# Patient Record
Sex: Female | Born: 1950 | Race: Black or African American | Hispanic: No | Marital: Single | Smoking: Never smoker
Health system: Southern US, Community
[De-identification: ages and names within clinical notes are randomized; demographics above are authoritative.]

## PROBLEM LIST (undated history)

## (undated) DIAGNOSIS — G4730 Sleep apnea, unspecified: Secondary | ICD-10-CM

## (undated) DIAGNOSIS — J4489 Other specified chronic obstructive pulmonary disease: Secondary | ICD-10-CM

## (undated) DIAGNOSIS — I509 Heart failure, unspecified: Secondary | ICD-10-CM

## (undated) DIAGNOSIS — R079 Chest pain, unspecified: Secondary | ICD-10-CM

## (undated) DIAGNOSIS — M1189 Other specified crystal arthropathies, multiple sites: Secondary | ICD-10-CM

## (undated) DIAGNOSIS — I471 Supraventricular tachycardia, unspecified: Secondary | ICD-10-CM

## (undated) DIAGNOSIS — J45909 Unspecified asthma, uncomplicated: Secondary | ICD-10-CM

## (undated) DIAGNOSIS — R55 Syncope and collapse: Secondary | ICD-10-CM

## (undated) DIAGNOSIS — Z8739 Personal history of other diseases of the musculoskeletal system and connective tissue: Secondary | ICD-10-CM

## (undated) DIAGNOSIS — F3289 Other specified depressive episodes: Secondary | ICD-10-CM

## (undated) DIAGNOSIS — J449 Chronic obstructive pulmonary disease, unspecified: Secondary | ICD-10-CM

## (undated) DIAGNOSIS — M5136 Other intervertebral disc degeneration, lumbar region: Secondary | ICD-10-CM

## (undated) DIAGNOSIS — K649 Unspecified hemorrhoids: Secondary | ICD-10-CM

## (undated) DIAGNOSIS — G629 Polyneuropathy, unspecified: Secondary | ICD-10-CM

## (undated) DIAGNOSIS — I499 Cardiac arrhythmia, unspecified: Secondary | ICD-10-CM

## (undated) DIAGNOSIS — E669 Obesity, unspecified: Secondary | ICD-10-CM

## (undated) DIAGNOSIS — K449 Diaphragmatic hernia without obstruction or gangrene: Secondary | ICD-10-CM

## (undated) DIAGNOSIS — E039 Hypothyroidism, unspecified: Secondary | ICD-10-CM

## (undated) DIAGNOSIS — R002 Palpitations: Secondary | ICD-10-CM

## (undated) DIAGNOSIS — M8580 Other specified disorders of bone density and structure, unspecified site: Secondary | ICD-10-CM

## (undated) DIAGNOSIS — N189 Chronic kidney disease, unspecified: Secondary | ICD-10-CM

## (undated) DIAGNOSIS — M19041 Primary osteoarthritis, right hand: Secondary | ICD-10-CM

## (undated) DIAGNOSIS — E079 Disorder of thyroid, unspecified: Secondary | ICD-10-CM

## (undated) DIAGNOSIS — I10 Essential (primary) hypertension: Secondary | ICD-10-CM

## (undated) DIAGNOSIS — M5030 Other cervical disc degeneration, unspecified cervical region: Secondary | ICD-10-CM

## (undated) DIAGNOSIS — M797 Fibromyalgia: Secondary | ICD-10-CM

## (undated) DIAGNOSIS — R609 Edema, unspecified: Secondary | ICD-10-CM

## (undated) DIAGNOSIS — M19042 Primary osteoarthritis, left hand: Secondary | ICD-10-CM

## (undated) DIAGNOSIS — E7800 Pure hypercholesterolemia, unspecified: Secondary | ICD-10-CM

## (undated) DIAGNOSIS — R0989 Other specified symptoms and signs involving the circulatory and respiratory systems: Secondary | ICD-10-CM

## (undated) DIAGNOSIS — J329 Chronic sinusitis, unspecified: Secondary | ICD-10-CM

## (undated) DIAGNOSIS — H269 Unspecified cataract: Secondary | ICD-10-CM

## (undated) DIAGNOSIS — M810 Age-related osteoporosis without current pathological fracture: Secondary | ICD-10-CM

## (undated) DIAGNOSIS — K219 Gastro-esophageal reflux disease without esophagitis: Secondary | ICD-10-CM

## (undated) DIAGNOSIS — F329 Major depressive disorder, single episode, unspecified: Secondary | ICD-10-CM

## (undated) HISTORY — DX: Obesity, unspecified: E66.9

## (undated) HISTORY — DX: Other cervical disc degeneration, unspecified cervical region: M50.30

## (undated) HISTORY — DX: Other specified symptoms and signs involving the circulatory and respiratory systems: R09.89

## (undated) HISTORY — DX: Personal history of other diseases of the musculoskeletal system and connective tissue: Z87.39

## (undated) HISTORY — DX: Other intervertebral disc degeneration, lumbar region: M51.36

## (undated) HISTORY — DX: Other specified depressive episodes: F32.89

## (undated) HISTORY — DX: Major depressive disorder, single episode, unspecified: F32.9

## (undated) HISTORY — DX: Other specified disorders of bone density and structure, unspecified site: M85.80

## (undated) HISTORY — PX: CATARACT EXTRACTION, BILATERAL: SHX1313

## (undated) HISTORY — DX: Other specified chronic obstructive pulmonary disease: J44.89

## (undated) HISTORY — DX: Primary osteoarthritis, right hand: M19.041

## (undated) HISTORY — DX: Chest pain, unspecified: R07.9

## (undated) HISTORY — PX: CARPAL TUNNEL RELEASE: SHX101

## (undated) HISTORY — DX: Diaphragmatic hernia without obstruction or gangrene: K44.9

## (undated) HISTORY — DX: Heart failure, unspecified: I50.9

## (undated) HISTORY — DX: Chronic kidney disease, unspecified: N18.9

## (undated) HISTORY — DX: Unspecified hemorrhoids: K64.9

## (undated) HISTORY — DX: Unspecified cataract: H26.9

## (undated) HISTORY — DX: Palpitations: R00.2

## (undated) HISTORY — DX: Supraventricular tachycardia: I47.1

## (undated) HISTORY — DX: Cardiac arrhythmia, unspecified: I49.9

## (undated) HISTORY — DX: Supraventricular tachycardia, unspecified: I47.10

## (undated) HISTORY — DX: Syncope and collapse: R55

## (undated) HISTORY — DX: Polyneuropathy, unspecified: G62.9

## (undated) HISTORY — DX: Primary osteoarthritis, left hand: M19.042

## (undated) HISTORY — DX: Age-related osteoporosis without current pathological fracture: M81.0

## (undated) HISTORY — DX: Essential (primary) hypertension: I10

## (undated) HISTORY — DX: Chronic obstructive pulmonary disease, unspecified: J44.9

## (undated) HISTORY — PX: INCONTINENCE SURGERY: SHX676

---

## 1997-02-01 HISTORY — PX: OTHER SURGICAL HISTORY: SHX169

## 2005-04-05 ENCOUNTER — Ambulatory Visit: Payer: Self-pay | Admitting: Cardiology

## 2005-04-06 ENCOUNTER — Ambulatory Visit: Payer: Self-pay | Admitting: Cardiology

## 2005-12-03 ENCOUNTER — Ambulatory Visit: Payer: Self-pay | Admitting: Cardiology

## 2005-12-28 ENCOUNTER — Ambulatory Visit: Payer: Self-pay | Admitting: Cardiology

## 2006-07-31 ENCOUNTER — Ambulatory Visit: Payer: Self-pay | Admitting: Cardiology

## 2006-08-05 ENCOUNTER — Ambulatory Visit: Payer: Self-pay | Admitting: Cardiology

## 2007-04-02 ENCOUNTER — Ambulatory Visit: Payer: Self-pay | Admitting: Cardiology

## 2008-04-12 ENCOUNTER — Ambulatory Visit: Payer: Self-pay | Admitting: Cardiology

## 2008-04-12 ENCOUNTER — Encounter: Payer: Self-pay | Admitting: Physician Assistant

## 2008-07-23 ENCOUNTER — Ambulatory Visit: Payer: Self-pay | Admitting: Cardiology

## 2008-09-29 ENCOUNTER — Encounter: Payer: Self-pay | Admitting: Cardiology

## 2009-04-08 ENCOUNTER — Encounter: Payer: Self-pay | Admitting: Cardiology

## 2009-06-02 ENCOUNTER — Ambulatory Visit: Payer: Self-pay | Admitting: Cardiology

## 2009-06-02 DIAGNOSIS — R002 Palpitations: Secondary | ICD-10-CM | POA: Insufficient documentation

## 2009-06-02 DIAGNOSIS — J449 Chronic obstructive pulmonary disease, unspecified: Secondary | ICD-10-CM

## 2009-06-02 DIAGNOSIS — F329 Major depressive disorder, single episode, unspecified: Secondary | ICD-10-CM

## 2009-06-02 DIAGNOSIS — I498 Other specified cardiac arrhythmias: Secondary | ICD-10-CM

## 2009-06-02 DIAGNOSIS — I509 Heart failure, unspecified: Secondary | ICD-10-CM | POA: Insufficient documentation

## 2009-06-02 DIAGNOSIS — E669 Obesity, unspecified: Secondary | ICD-10-CM

## 2009-06-02 DIAGNOSIS — I499 Cardiac arrhythmia, unspecified: Secondary | ICD-10-CM | POA: Insufficient documentation

## 2009-06-02 DIAGNOSIS — J441 Chronic obstructive pulmonary disease with (acute) exacerbation: Secondary | ICD-10-CM | POA: Insufficient documentation

## 2009-06-02 DIAGNOSIS — K449 Diaphragmatic hernia without obstruction or gangrene: Secondary | ICD-10-CM | POA: Insufficient documentation

## 2009-06-02 DIAGNOSIS — I5032 Chronic diastolic (congestive) heart failure: Secondary | ICD-10-CM | POA: Insufficient documentation

## 2009-06-02 DIAGNOSIS — J4489 Other specified chronic obstructive pulmonary disease: Secondary | ICD-10-CM | POA: Insufficient documentation

## 2009-06-02 DIAGNOSIS — R079 Chest pain, unspecified: Secondary | ICD-10-CM | POA: Insufficient documentation

## 2009-06-09 ENCOUNTER — Encounter: Payer: Self-pay | Admitting: Cardiology

## 2009-06-13 ENCOUNTER — Encounter (INDEPENDENT_AMBULATORY_CARE_PROVIDER_SITE_OTHER): Payer: Self-pay | Admitting: *Deleted

## 2009-12-30 ENCOUNTER — Ambulatory Visit: Payer: Self-pay | Admitting: Cardiology

## 2009-12-30 DIAGNOSIS — R0602 Shortness of breath: Secondary | ICD-10-CM | POA: Insufficient documentation

## 2009-12-30 DIAGNOSIS — M0510 Rheumatoid lung disease with rheumatoid arthritis of unspecified site: Secondary | ICD-10-CM | POA: Insufficient documentation

## 2010-01-04 ENCOUNTER — Encounter: Payer: Self-pay | Admitting: Cardiology

## 2010-01-11 ENCOUNTER — Encounter (INDEPENDENT_AMBULATORY_CARE_PROVIDER_SITE_OTHER): Payer: Self-pay | Admitting: *Deleted

## 2010-04-08 ENCOUNTER — Encounter: Payer: Self-pay | Admitting: Physician Assistant

## 2010-04-09 ENCOUNTER — Encounter: Payer: Self-pay | Admitting: Physician Assistant

## 2010-04-10 ENCOUNTER — Encounter: Payer: Self-pay | Admitting: Cardiology

## 2010-04-26 ENCOUNTER — Ambulatory Visit: Payer: Self-pay | Admitting: Physician Assistant

## 2010-05-04 ENCOUNTER — Ambulatory Visit: Payer: Self-pay | Admitting: Physician Assistant

## 2010-07-22 ENCOUNTER — Encounter: Payer: Self-pay | Admitting: Cardiology

## 2010-09-05 ENCOUNTER — Encounter: Payer: Self-pay | Admitting: Cardiology

## 2010-09-28 ENCOUNTER — Ambulatory Visit
Admission: RE | Admit: 2010-09-28 | Discharge: 2010-09-28 | Payer: Self-pay | Source: Home / Self Care | Attending: Cardiology | Admitting: Cardiology

## 2010-10-05 NOTE — Assessment & Plan Note (Signed)
Summary: 1 WK F/U-EKG-PER 8/24 OV W/GENE-JM   Visit Type:  Follow-up Primary Provider:  Dr Linna Darner   History of Present Illness: patient presents for scheduled early followup.  When last seen, she was found to be in isorhythmic dissociation. I recommended she stopped both Lopressor and verapamil, to return today for early follow up with repeat EKG. Electrocardiogram indicates normal sinus rhythm at 62 bpm.  Clinically, she states that she began feeling "nervous" on Monday, and resumed metoprolol, albeit at once daily dosing. She did not resume verapamil, as instructed. Overall, she seems to feel a little "better".  Preventive Screening-Counseling & Management  Alcohol-Tobacco     Smoking Status: never  Current Medications (verified): 1)  Furosemide 40 Mg Tabs (Furosemide) .... Take 1 Tablet By Mouth Once A Day 2)  Paxil 30 Mg Tabs (Paroxetine Hcl) .... Take 1 Tablet By Mouth Once A Day 3)  Potassium Chloride Crys Cr 10 Meq Cr-Tabs (Potassium Chloride Crys Cr) .... Take 1 Tablet By Mouth Once A Day 4)  Proair Hfa 108 (90 Base) Mcg/act Aers (Albuterol Sulfate) .... Inhale 2 Puffs [place] Daily 5)  Spiriva Handihaler 18 Mcg Caps (Tiotropium Bromide Monohydrate) .... One Inhalation Daily 6)  Restasis 0.05 % Emul (Cyclosporine) .... Two Times A Day 7)  Flonase 50 Mcg/act Susp (Fluticasone Propionate) .... 2 Sprays in Each Nostril Once Daily 8)  Polyethylene Glycol  Powd (Polyethylene Glycol 1450) .Marland KitchenMarland KitchenMarland Kitchen 17 Grams in 8 Oz of Water Once Daily 9)  Metoprolol Tartrate 50 Mg Tabs (Metoprolol Tartrate) .... Take 1/2 Tablet By Mouth As Needed 10)  Fexofenadine Hcl 60 Mg Tabs (Fexofenadine Hcl) .... Take 1 Tablet By Mouth Two Times A Day 11)  Clonazepam 0.5 Mg Tabs (Clonazepam) .... Take 1 Tab By Mouth At Bedtime 12)  Symbicort 160-4.5 Mcg/act Aero (Budesonide-Formoterol Fumarate) .... 2 Puffs Two Times A Day 13)  Aspirin 81 Mg Tbec (Aspirin) .... Take One Tablet By Mouth Daily 14)  Pepcid Ac 10 Mg  Tabs (Famotidine) .... Take 1 Tablet By Mouth Once A Day As Needed  Allergies (verified): 1)  ! Codeine 2)  ! Adhesive Tape  Comments:  Nurse/Medical Assistant: The patient's medication  bottles and allergies were reviewed with the patient and were updated in the Medication and Allergy Lists.  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 All other systems negative.   Vital Signs:  Patient profile:   60 year old female Height:      61 inches Weight:      210 pounds O2 Sat:      99 % on Room air Pulse rate:   66 / minute BP sitting:   134 / 79  (left arm) Cuff size:   large  Vitals Entered By: Carlye Grippe (May 04, 2010 3:26 PM)  O2 Flow:  Room air  Physical Exam  Additional Exam:  GEN: 60 year old female, morbidly obese, no distress HEENT: NCAT,PERRLA,EOMI NECK: palpable pulses, no bruits; no JVD; no TM LUNGS: CTA bilaterally HEART: RRR (S1S2); no significant murmurs; no rubs; no gallops  ABD: soft, NT; intact BS EXT: intact distal pulses; no edema SKIN: warm, dry MUSC: no obvious deformity NEURO: A/O (x3)     EKG  Procedure date:  05/04/2010  Findings:      normal sinus rhythm at 62 bpm; was no ischemic changes  Impression & Recommendations:  Problem # 1:  SUPRAVENTRICULAR TACHYCARDIA  (ICD-427.89)  repeat electrocardiogram in the office today indicates resolution of recently documented isorhythmic dissociation, following temporary cessation of combination of metoprolol and verapamil. Of note, however, the patient did resume metoprolol on her own, earlier this week, at a reduced dose of once daily dosing. Given that she is tolerating this quite well, I have elected to have her remain on this medication, rather than place her on verapamil only, for her history of SVT. She is agreeable with this plan, and will return to clinic to resume followup with Dr. Andee Lineman in 6 months.  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 You will receive a reminder letter in the mail one-two months in advance. If you don't receive a letter, please call our office to schedule the follow-up appointment. 2)  Finish current supply of Lopressor (metoprolol tart). 3)  Then start Tropol XL (metoprolol succ or ER) 50mg  by mouth once daily. Prescriptions: METOPROLOL SUCCINATE 50 MG XR24H-TAB (METOPROLOL SUCCINATE) Take one tablet by mouth daily  #30 x 6   Entered by:   Cyril Loosen, RN, BSN   Authorized by:   Nelida Meuse, PA-C   Signed by:   Cyril Loosen, RN, BSN on 05/04/2010   Method used:   Electronically to        [facility]* (retail)       [address]       [place], [place]  [address]       Ph: [phone]       Fax: [phone]   RxID:   [id number]  I have reviewed and approved all prescriptions at the time of the office visit. Nelida Meuse, PA-C  May 04, 2010 3:58 PM

## 2010-10-05 NOTE — Assessment & Plan Note (Signed)
Summary: 4 MO FUL   Visit Type:  Follow-up Primary Provider:  Hasanaj   History of Present Illness: patient presents for scheduled followup.  She has history of SVT and diastolic heart failure, and no known CAD. During previous visits, she was found to have evidence of isorhythmic dissociation by EKG, which resolved following discontinuation of verapamil, and temporary cessation of metoprolol. The latter was subsequently resumed by her, and followup EKGs indicated restoration of NSR.  Clinically, she denies any interim CP or tachypalpitations. She is being closely followed by Dr. Cherie Ouch, with recent diagnosis of pulmonary hypertension. She had a recent cardiopulmonary function test, and is due to see him next month in followup. She denied any associated CP, during stress testing.  Preventive Screening-Counseling & Management  Alcohol-Tobacco     Smoking Status: never  Current Medications (verified): 1)  Furosemide 40 Mg Tabs (Furosemide) .... Take 1 Tablet By Mouth Once A Day 2)  Paxil 30 Mg Tabs (Paroxetine Hcl) .... Take 1 Tablet By Mouth Once A Day 3)  Potassium Chloride Crys Cr 10 Meq Cr-Tabs (Potassium Chloride Crys Cr) .... Take 1 Tablet By Mouth Once A Day 4)  Proair Hfa 108 (90 Base) Mcg/act Aers (Albuterol Sulfate) .... Inhale 2 Puffs [place] Daily 5)  Restasis 0.05 % Emul (Cyclosporine) .... Two Times A Day 6)  Flonase 50 Mcg/act Susp (Fluticasone Propionate) .... 2 Sprays in Each Nostril Once Daily 7)  Polyethylene Glycol  Powd (Polyethylene Glycol 1450) .Marland KitchenMarland KitchenMarland Kitchen 17 Grams in 8 Oz of Water Once Daily 8)  Loratadine 10 Mg Tabs (Loratadine) .... Take 1 Tablet By Mouth Once A Day 9)  Aspirin 81 Mg Tbec (Aspirin) .... Take One Tablet By Mouth Daily 10)  Ranitidine Hcl 150 Mg Caps (Ranitidine Hcl) .... Take 1 Tablet By Mouth Once A Day 11)  Metoprolol Succinate 50 Mg Xr24h-Tab (Metoprolol Succinate) .... Take One Tablet By Mouth Daily 12)  Meloxicam 15 Mg Tabs  (Meloxicam) .... Take 1 Tablet By Mouth Once A Day 13)  Miralax  Powd (Polyethylene Glycol 3350) .... Dissolve 17gm in [employer] or Juice and Drink Once Daily  Allergies (verified): 1)  ! Codeine 2)  ! Adhesive Tape  Comments:  Nurse/Medical Assistant: The patient's medication bottles and allergies were reviewed with the patient and were updated in the Medication and Allergy Lists.  Past History:  Past Medical History: SUPRAVENTRICULAR TACHYCARDIA (ICD-427.89) CHEST PAIN UNSPECIFIED (ICD-786.50) CHF (ICD-428.0) DEPRESSIVE DISORDER NOT ELSEWHERE CLASSIFIED (ICD-311) DIAPHRAGMAT HERN W/O MENTION OBSTRUCTION/GANGREN (ICD-553.3) UNSPECIFIED ESSENTIAL HYPERTENSION (ICD-401.9) UNSPECIFIED CARDIAC DYSRHYTHMIA (ICD-427.9) PALPITATIONS (ICD-785.1) COPD (ICD-496) OBESITY, UNSPECIFIED (ICD-278.00) History of Rheumatoid arthritis  Bilateral Cartoid Bruits  Review of Systems       No fevers, chills, hemoptysis, dysphagia, melena, hematocheezia, hematuria, rash, claudication, orthopnea, pnd, pedal edema. All other systems negative.   Vital Signs:  Patient profile:   60 year old female Height:      61 inches Weight:      209 pounds Pulse rate:   62 / minute BP sitting:   122 / 81  (left arm) Cuff size:   large  Vitals Entered By: Carlye Grippe (September 28, 2010 3:08 PM)  Physical Exam  Additional Exam:  GEN: 60 year old female, morbidly obese, no distress HEENT: NCAT,PERRLA,EOMI NECK: palpable pulses, no bruits; no JVD; no TM LUNGS: CTA bilaterally HEART: RRR (S1S2); no significant murmurs; no rubs; no gallops ABD: soft, NT; intact BS EXT: intact distal pulses; no edema SKIN: warm, dry MUSC: no obvious deformity  NEURO: A/O (x3)     Impression & Recommendations:  Problem # 1:  SUPRAVENTRICULAR TACHYCARDIA (ICD-427.89)  No suggestion of recurrent SVT, per patient history. Therefore, continue current medication regimen, and schedule return follow up in one year, with Dr.  Andee Lineman.  Problem # 2:  CHRONIC DIASTOLIC HEART FAILURE (ICD-428.32)  the patient appears euvolemic by history and physical presentation. She has lost one pound, since her last visit. She is on maintenance dose furosemide, followed by Dr. Olena Leatherwood.  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 She has no known history of CAD, and had a normal Cardiolite in 2007.  Patient Instructions: 1)  Your physician recommends that you continue on your current medications as directed. Please refer to the Current Medication list given to you today. 2)  Follow up in  1 year

## 2010-10-05 NOTE — Miscellaneous (Signed)
Summary: Rehab Report/ VISIT  Rehab Report/ VISIT   Imported By: Dorise Hiss 09/28/2010 14:21:49  _____________________________________________________________________  External Attachment:    Type:   Image     Comment:   External Document

## 2010-10-05 NOTE — Letter (Signed)
Summary: Engineer, materials at [facility]  [address]   [place], [place] [address]   Phone: [phone]  Fax: [phone]        Jan 11, 2010 MRN: [id number]   DAWSYN ZURN [address] [place], [place]  [address]   Dear Ms. Goble,  Your test ordered by Selena Batten has been reviewed by your physician (or physician assistant) and was found to be normal or stable. Your physician (or physician assistant) felt no changes were needed at this time.  ____ Echocardiogram  ____ Cardiac Stress Test  __X__ Lab Work  ____ Peripheral vascular study of arms, legs or neck  ____ CT scan or X-ray  ____ Lung or Breathing test  ____ Other:   Thank you.   Hoover Brunette, LPN    Duane Boston, M.D., F.A.C.C. Thressa Sheller, M.D., F.A.C.C. Oneal Grout, M.D., F.A.C.C. Cheree Ditto, M.D., F.A.C.C. Daiva Nakayama, M.D., F.A.C.C. Kenney Houseman, M.D., F.A.C.C. Jeanne Ivan, PA-C

## 2010-10-05 NOTE — Assessment & Plan Note (Signed)
Summary: EPH-2 WK POST HOSP. FU D/C [facility] 8-8 VS   Visit Type:  hospital follow-up Primary Provider:  Dr Linna Darner   History of Present Illness: patient presents for post hospital followup, following recent hospitalization here at [facility] with chest pain/dyspnea. Formal cardiology consultation was not requested. Serialcardiac markers normal, BNP negative, and no evidence of CHF on x-ray.  Since then, patient denies any recurrent chest pain. She denies symptoms suggestive of CHF. She is due to follow up with Dr. Orson Aloe, whom she has seen in the past, for intrinsic lung disease (rheumatoid).  Patient has history of SVT, and has occasional palpitations, essentially asymptomatic. Of note, she also complains of intermittent dizziness, unpredictable in onset, and not associated with palpitations. She denies frank syncope.  Preventive Screening-Counseling & Management  Alcohol-Tobacco     Smoking Status: never  Current Medications (verified): 1)  Furosemide 40 Mg Tabs (Furosemide) .... Take 1 Tablet By Mouth Once A Day 2)  Paxil 30 Mg Tabs (Paroxetine Hcl) .... Take 1 Tablet By Mouth Once A Day 3)  Potassium Chloride Crys Cr 10 Meq Cr-Tabs (Potassium Chloride Crys Cr) .... Take 1 Tablet By Mouth Once A Day 4)  Verapamil Hcl Cr 180 Mg Cr-Tabs (Verapamil Hcl) .... Take 1 Tablet By Mouth Once A Day 5)  Proair Hfa 108 (90 Base) Mcg/act Aers (Albuterol Sulfate) .... Inhale 2 Puffs [place] Daily 6)  Spiriva Handihaler 18 Mcg Caps (Tiotropium Bromide Monohydrate) .... One Inhalation Daily 7)  Restasis 0.05 % Emul (Cyclosporine) .... Two Times A Day 8)  Flonase 50 Mcg/act Susp (Fluticasone Propionate) .... 2 Sprays in Each Nostril Once Daily 9)  Polyethylene Glycol  Powd (Polyethylene Glycol 1450) .Marland KitchenMarland KitchenMarland Kitchen 17 Grams in 8 Oz of Water Once Daily 10)  Metoprolol Tartrate 50 Mg Tabs (Metoprolol Tartrate) .... Take One Tablet By Mouth Twice A Day 11)  Fexofenadine Hcl 60 Mg Tabs (Fexofenadine Hcl) .... Take 1  Tablet By Mouth Two Times A Day 12)  Clonazepam 0.5 Mg Tabs (Clonazepam) .... Take 1 Tab By Mouth At Bedtime 13)  Symbicort 160-4.5 Mcg/act Aero (Budesonide-Formoterol Fumarate) .... 2 Puffs Two Times A Day  Allergies (verified): 1)  ! Codeine 2)  ! Adhesive Tape  Comments:  Nurse/Medical Assistant: The patient's medication list and allergies were reviewed with the patient and were updated in the Medication and Allergy Lists.  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 All other systems negative.   Vital Signs:  Patient profile:   60 year old female Height:      61 inches Weight:      210 pounds BMI:     39.82 O2 Sat:      98 % on Room air Pulse rate:   43 / minute BP sitting:   114 / 67  (left arm) Cuff size:   large  Vitals Entered By: Carlye Grippe (April 26, 2010 2:01 PM)  Nutrition Counseling: Patient's BMI is greater than 25 and therefore counseled on weight management options.  O2 Flow:  Room air  Physical Exam  Additional Exam:  GEN: 60 year old female, morbidly obese, no distress HEENT: NCAT,PERRLA,EOMI NECK: palpable pulses, no bruits; no JVD; no TM LUNGS: CTA bilaterally HEART:irregularly irregular (S1S2); no significant murmurs; no rubs; no gallops ABD: soft, NT; intact BS EXT: intact  distal pulses; no edema SKIN: warm, dry MUSC: no obvious deformity NEURO: A/O (x3)     EKG  Procedure date:  04/26/2010  Findings:      iso-arrhythmic dissociation at 52 bpm; no ischemic changes.  Impression & Recommendations:  Problem # 1:  CHEST PAIN UNSPECIFIED (ICD-786.50)  atypical, per recent history, with normal cardiac markers. No documented history of CAD, with normal Cardiolite in 2007. No further workup indicated.  Problem # 2:  CHRONIC DIASTOLIC HEART FAILURE (ICD-428.32)  euvolemic by history, with recent negative chest x-ray and BNP level. Diuretic treatment, per Dr. Linna Darner.  Problem # 3:  SUPRAVENTRICULAR TACHYCARDIA (ICD-427.89)  patient complains of long-standing palpitations, but no significant associated symptoms. However, she also presents with complaint of intermittent dizziness, unpredictable in onset, with no associated syncope. twelve-lead EKG today indicates isorhythmic dissociation. Will discontinue verapamil and metoprolol, and monitor closely with early clinic follow up with myself and Dr. Andee Lineman. Of note, patient is concerned regarding recurrent tachypalpitations; therefore, recommend p.r.n. metoprolol 25 mg, if needed ( Per Dr. Andee Lineman, preference would be to discontinue beta blocker, given its pronounced effect on the SA node, and continue treatment with calcium channel blocker, for history of SVT).  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 Cherie Ouch.  Other Orders: EKG w/ Interpretation (93000)  Patient Instructions: 1)  Follow up with Gene on Thursday, Sept 1, 2011 2:20pm. 2)  Stop Verapamil.  3)  Stop Metoprolol tart. Take only 1/2 tablet as needed for prolonged palpitations/racing heart. (Do not take more than 1 tablet per day.) 4)  Decrease Aspirin to 81mg  by mouth once daily.

## 2010-10-05 NOTE — Letter (Signed)
Summary: [facility] H&P/ D/C DR. [facility]  [facility] H[facility]P/ D/C DR. [facility]WAR   Imported By: Zachary George 04/25/2010 13:01:36  _____________________________________________________________________  External Attachment:    Type:   Image     Comment:   External Document

## 2010-10-05 NOTE — Assessment & Plan Note (Signed)
Summary: 6 MO FU PER APRIL   Visit Type:  Follow-up Primary Provider:  Dr Linna Darner   History of Present Illness: the patient is a 60 year old female with history of supraventricular tachycardia and diastolic heart failure.  The patient also has obstructive sleep apnea but is not compliant with her CPAP device.  She has normal LV function.  She has no history of coronary artery disease.  She had a normal Cardiolite in 2007.  Chills abnormal carotid Doppler several years ago.  She has been diagnosed with rheumatoid lung disease.  The patient has chronic dyspnea.  She is in NYHA class two.  She participates in pulmonary rehab.  She has developed some lower extremity edema possibly related to her verapamil.  She denies any chest pain palpitations or syncope.  Preventive Screening-Counseling & Management  Alcohol-Tobacco     Smoking Status: never  Current Medications (verified): 1)  Furosemide 40 Mg Tabs (Furosemide) .... Take 1 Tablet By Mouth Once A Day 2)  Paxil 30 Mg Tabs (Paroxetine Hcl) .... Take 1 Tablet By Mouth Once A Day 3)  Potassium Chloride Crys Cr 10 Meq Cr-Tabs (Potassium Chloride Crys Cr) .... Take 1 Tablet By Mouth Once A Day 4)  Verapamil Hcl Cr 180 Mg Cr-Tabs (Verapamil Hcl) .... Take 1 Tablet By Mouth Once A Day 5)  Proair Hfa 108 (90 Base) Mcg/act Aers (Albuterol Sulfate) .... Inhale 2 Puffs [place] Daily 6)  Spiriva Handihaler 18 Mcg Caps (Tiotropium Bromide Monohydrate) .... One Inhalation Daily 7)  Restasis 0.05 % Emul (Cyclosporine) .... Two Times A Day 8)  Flonase 50 Mcg/act Susp (Fluticasone Propionate) .... 2 Sprays in Each Nostril Once Daily 9)  Polyethylene Glycol  Powd (Polyethylene Glycol 1450) .Marland KitchenMarland KitchenMarland Kitchen 17 Grams in 8 Oz of Water Once Daily 10)  Metoprolol Tartrate 50 Mg Tabs (Metoprolol Tartrate) .... Take One Tablet By Mouth Twice A Day 11)  Fexofenadine Hcl 60 Mg Tabs (Fexofenadine Hcl) .... Take 1 Tablet By Mouth Two Times A Day 12)  Clonazepam 0.5 Mg Tabs  (Clonazepam) .... Take 1 Tab By Mouth At Bedtime  Allergies: 1)  ! Codeine 2)  ! Adhesive Tape  Comments:  Nurse/Medical Assistant: The patient's medications were reviewed with the patient and were updated in the Medication List. Pt brought a list of medications to office visit.  Cyril Loosen, RN, BSN (December 30, 2009 8:50 AM)   Past History:  Past Medical History: Last updated: 06/02/2009 SUPRAVENTRICULAR TACHYCARDIA (ICD-427.89) CHEST PAIN UNSPECIFIED (ICD-786.50) CHF (ICD-428.0) DEPRESSIVE DISORDER NOT ELSEWHERE CLASSIFIED (ICD-311) DIAPHRAGMAT HERN W/O MENTION OBSTRUCTION/GANGREN (ICD-553.3) UNSPECIFIED ESSENTIAL HYPERTENSION (ICD-401.9) UNSPECIFIED CARDIAC DYSRHYTHMIA (ICD-427.9) PALPITATIONS (ICD-785.1) COPD (ICD-496) OBESITY, UNSPECIFIED (ICD-278.00) DIETARY SURVEILLANCE AND COUNSELING (ICD-V65.3) DM (ICD-250.00) History of Rheumatoid arthritis  Bilateral Cartoid Bruits  Past Surgical History: Last updated: 06/02/2009 vein surgery bladder carpel tunnel catheter ablation 02/1997 @ [facility]  Family History: Last updated: 06/02/2009 Mother: Had heart disease and cancer Strokes run in the family  Social History: Last updated: 06/02/2009 Tobacco Use - No.  Alcohol Use - no Drug Use - no Regular Exercise - yes Retired  Single   Risk Factors: Smoking Status: never (12/30/2009)  Review of Systems       The patient complains of shortness of breath, sleep apnea, and leg swelling.  The patient denies fatigue, malaise, fever, weight gain/loss, vision loss, decreased hearing, hoarseness, chest pain, palpitations, prolonged cough, wheezing, coughing up blood, abdominal pain, blood in stool, nausea, vomiting, diarrhea, heartburn, incontinence, blood in urine, muscle weakness, joint  pain, rash, skin lesions, headache, fainting, dizziness, depression, anxiety, enlarged lymph nodes, easy bruising or bleeding, and environmental allergies.    Vital Signs:  Patient  profile:   60 year old female Height:      61 inches Weight:      212.50 pounds O2 Sat:      97 % on Room air Pulse rate:   51 / minute BP sitting:   103 / 66  (left arm) Cuff size:   large  Vitals Entered By: Cyril Loosen, RN, BSN (December 30, 2009 8:44 AM)  O2 Flow:  Room air Comments Follow up visit.   Physical Exam  Additional Exam:  General: Well-developed, well-nourished in no distress head: Normocephalic and atraumatic eyes PERRLA/EOMI intact, conjunctiva and lids normal nose: No deformity or lesions mouth normal dentition, normal posterior pharynx neck: Supple, no JVD.  No masses, thyromegaly or abnormal cervical nodes lungs: Normal breath sounds bilaterally without wheezing.  Normal percussion heart: regular rate and rhythm with normal S1 and S2, no S3 or S4.  PMI is normal.  No pathological murmurs abdomen: Normal bowel sounds, abdomen is soft and nontender without masses, organomegaly or hernias noted.  No hepatosplenomegaly musculoskeletal: Back normal, normal gait muscle strength and tone normal pulsus: Pulse is normal in all 4 extremities Extremities: 1+ peripheral pitting edema neurologic: Alert and oriented x 3 skin: Intact without lesions or rashes cervical nodes: No significant adenopathy psychologic: Normal affect    Impression & Recommendations:  Problem # 1:  SHORTNESS OF BREATH (ICD-786.05) multifactorial related to deconditioning, diastolic heart failure and in particular rheumatoid lung disease Her updated medication list for this problem includes:    Furosemide 40 Mg Tabs (Furosemide) .Marland Kitchen... Take 1 tablet by mouth once a day    Verapamil Hcl Cr 180 Mg Cr-tabs (Verapamil hcl) .Marland Kitchen... Take 1 tablet by mouth once a day    Metoprolol Tartrate 50 Mg Tabs (Metoprolol tartrate) .Marland Kitchen... Take one tablet by mouth twice a day  Orders: T-Basic Metabolic Panel [phone])  Problem # 2:  CHRONIC DIASTOLIC HEART FAILURE (ICD-428.32) the patient does have some  edema but is likely secondary to verapamil.  I doubt a significant volume overload.  I would continue her current medical management and recommend support stockings.  We will obtain an electrolyte panel in one week. Her updated medication list for this problem includes:    Furosemide 40 Mg Tabs (Furosemide) .Marland Kitchen... Take 1 tablet by mouth once a day    Verapamil Hcl Cr 180 Mg Cr-tabs (Verapamil hcl) .Marland Kitchen... Take 1 tablet by mouth once a day    Metoprolol Tartrate 50 Mg Tabs (Metoprolol tartrate) .Marland Kitchen... Take one tablet by mouth twice a day  Orders: T-Basic Metabolic Panel [phone])  Problem # 3:  SUPRAVENTRICULAR TACHYCARDIA (ICD-427.89) no recurrence. Her updated medication list for this problem includes:    Verapamil Hcl Cr 180 Mg Cr-tabs (Verapamil hcl) .Marland Kitchen... Take 1 tablet by mouth once a day    Metoprolol Tartrate 50 Mg Tabs (Metoprolol tartrate) .Marland Kitchen... Take one tablet by mouth twice a day  Problem # 4:  RHEUMATOID LUNG (ICD-714.81) Assessment: Comment Only  Patient Instructions: 1)  Labs:  BMET next week 2)  Follow up in  6 months

## 2010-12-19 ENCOUNTER — Other Ambulatory Visit: Payer: Self-pay | Admitting: Neurology

## 2010-12-19 DIAGNOSIS — G959 Disease of spinal cord, unspecified: Secondary | ICD-10-CM

## 2010-12-19 DIAGNOSIS — M5412 Radiculopathy, cervical region: Secondary | ICD-10-CM

## 2010-12-28 ENCOUNTER — Ambulatory Visit (HOSPITAL_COMMUNITY)
Admission: RE | Admit: 2010-12-28 | Discharge: 2010-12-28 | Disposition: A | Discharge status: Home / Self Care | Payer: Medicare Other | Source: Ambulatory Visit | Attending: Neurology | Admitting: Neurology

## 2010-12-28 DIAGNOSIS — M6281 Muscle weakness (generalized): Secondary | ICD-10-CM | POA: Insufficient documentation

## 2010-12-28 DIAGNOSIS — M5020 Other cervical disc displacement, unspecified cervical region: Secondary | ICD-10-CM | POA: Insufficient documentation

## 2010-12-28 DIAGNOSIS — M5030 Other cervical disc degeneration, unspecified cervical region: Secondary | ICD-10-CM | POA: Insufficient documentation

## 2010-12-28 DIAGNOSIS — R209 Unspecified disturbances of skin sensation: Secondary | ICD-10-CM | POA: Insufficient documentation

## 2010-12-28 DIAGNOSIS — M5124 Other intervertebral disc displacement, thoracic region: Secondary | ICD-10-CM | POA: Insufficient documentation

## 2010-12-28 DIAGNOSIS — G959 Disease of spinal cord, unspecified: Secondary | ICD-10-CM

## 2010-12-28 DIAGNOSIS — M5412 Radiculopathy, cervical region: Secondary | ICD-10-CM

## 2011-01-10 ENCOUNTER — Other Ambulatory Visit: Payer: Self-pay | Admitting: Physician Assistant

## 2011-01-16 NOTE — Assessment & Plan Note (Signed)
[facility]                          [facility]   Dorothy Ford, Dorothy Ford                    MRN:          [id number]  DATE:04/02/2007                            DOB:          [birth year]    HISTORY OF PRESENT ILLNESS:  The patient is a 60 year old female with a  history of dyspnea and obstructive sleep apnea.  The patient has been  doing well.  She reports no chest pain, orthopnea, PND.  She was last  seen in November of 2007.  Given her cardiac risk factor profile and  dyspnea, a Cardiolite study was performed that demonstrated fair  exercise tolerance but no ischemia.  The patient is here for followup,  she has no new complaints.   MEDICATIONS:  1. Verapamil 180 mg p.o. daily.  2. Klor-Con 10 mg p.o. daily.  3. Paxil 30 mg p.o. daily.  4. Zyrtec 10 mg p.o. daily.  5. Lasix 20 mg p.o. daily.  6. Metoprolol 50 mg p.o. q.12.  7. Nasal spray.  8. Laxative.  9. Aspirin.  10.Multivitamin.  11.Calcium.  12.Glucosamine.   PHYSICAL EXAMINATION:  VITAL SIGNS:  Blood pressure 135/80, heart rate  is 63 bpm, weight is 228 pounds.  NECK EXAM:  Normal carotid upstroke and no carotid bruits.  LUNGS:  Clear breath sounds bilaterally.  HEART:  Regular rate and rhythm, normal S1, S2.  No murmurs, rubs or  gallop.  ABDOMEN:  Soft.  EXTREMITY EXAM:  No clubbing, cyanosis or edema.  NEURO:  Patient alert and oriented, grossly nonfocal.   A 12-lead echocardiogram normal sinus rhythm, no acute ischemic changes.   PROBLEM LIST:  1. Obstructive sleep apnea.  2. History of premature supraventricular tachycardia.  3. History of hypertension.  4. History of presumed diastolic dysfunction.  5. Ruled out for significant coronary artery disease with negative      Cardiolite study.  6. Bilateral carotid bruits but with normal Dopplers in 2003.   PLAN:  1. From a cardiovascular perspective, the patient appears to be      stable.  I do not think  there is an indication for catheterization.  2. The patient needs to start on an exercise program.  We have      discussed risk factor modification.  3. The patient can followup with [place] in 1 year.     Learta Codding, MD,FACC  Electronically Signed    GED/MedQ  DD: 04/02/2007  DT: 04/03/2007  Job #: [id number]

## 2011-01-16 NOTE — Assessment & Plan Note (Signed)
[facility]                          [facility] NOTE   Dorothy Ford, Dorothy Ford                    MRN:          [id number]  DATE:04/12/2008                            DOB:          02-12-1951    PRIMARY CARE PHYSICIAN:  Erasmo Downer, MD   PRIMARY CARDIOLOGIST:  Learta Codding, MD, [facility]   REASON FOR VISIT:  Routine followup.   HISTORY OF PRESENT ILLNESS:  Dorothy Ford returns for a 1-year visit.  She has a history of paroxysmal supraventricular tachycardia, reportedly  status post failed attempt of ablation at [facility] many years ago.  She has no clearly documented history of  obstructive coronary artery disease based on Cardiolite from 2007 and  overall has normal left ventricular systolic function without any  significant valvular abnormalities based on echocardiography, also from  2007.  She reports no major problems with recurrent or persistent  palpitations on her present medications.  She mainly states that she has  had some sinus difficulty over the last year.  She has not been  exercising regularly.  We did speak about this today.  She is not  reporting any anginal chest pain and has NYHA class II dyspnea on  exertion.  She has had no syncope.   ALLERGIES:  CODEINE.   PRESENT MEDICATIONS:  1. Verapamil 180 mg p.o. daily.  2. Klor-Con 10 mEq p.o. daily.  3. Paxil 40 mg p.o. daily.  4. Lasix 20 mg p.o. daily.  5. Metoprolol 50 mg p.o. b.i.d.  6. Restasis eyedrops q.12 h.  7. Nasacort as directed.  8. Aspirin 325 mg p.o. daily.  9. Multivitamin one p.o. daily.  10.Calcium 600 mg p.o. daily.  11.Glucosamine.  12.Allegra.   REVIEW OF SYSTEMS:  As per history of present illness.  Otherwise  negative.   PHYSICAL EXAMINATION:  VITAL SIGNS:  Blood pressure is 143/77, heart  rate is 72, weight 220 pounds.  GENERAL:  The patient is comfortable in no acute distress.  HEENT:  Conjunctiva is normal.   Pharynx clear.  NECK:  Supple.  No jugular venous distention.  No thyromegaly.  LUNGS:  Clear without labored breathing.  CARDIAC:  Regular rate and rhythm.  No pathologic murmur or S3 gallop.  EXTREMITIES:  Exhibit trace edema.   IMPRESSION AND RECOMMENDATIONS:  1. History of paroxysmal supraventricular tachycardia, essentially      quiescent on medical therapy.  I will not make any specific changes      today.  Her followup electrocardiogram shows sinus rhythm at 70      beats per minute with left atrial enlargement and decreased      anterior R-wave progression.  She will follow up in one year's time      to see Dr. Andee Lineman.  2. No clear history of obstructive cardiovascular disease.  Cardiolite      revealed no ischemia in 2007.  She is not reporting any angina.  I      recommended a basic walking regimen.  3. Reported history of cardiac murmur, although with echocardiography  in April 2007 demonstrating no major valvular abnormalities.  No      clear indication for endocarditis prophylaxis measures.     Jonelle Sidle, MD  Electronically Signed    SGM/MedQ  DD: 04/12/2008  DT: 04/13/2008  Job #: [id number]   cc:   Erasmo Downer, MD  Learta Codding, MD,FACC

## 2011-01-19 NOTE — Assessment & Plan Note (Signed)
[facility]                          [facility]   Dorothy Ford, Dorothy Ford                    MRN:          [id number]  DATE:07/31/2006                            DOB:          1950-10-15    [name] returns today for post hospitalization visit.  We have not  actually seen the patient in the office since August of 2006.  She  apparently was just seen at [facility] by Dr. Olena Leatherwood secondary  to chest pain.  [name] states she stayed overnight for  observation, was told that it was not her heart that was causing  problems during recent hospitalization, and it was not her heart  failure.  Lab work from that hospitalization showing a BNP of 27, BUN  and creatinine of 6 and 1.0, potassium was 3.5, a TSH of 1.36, and  negative cardiac enzymes.  [name] returns today for further  followup.  She is complaining of ongoing fatigue and shortness of breath  with fatigue and exertion.  She states that this has been going on for  several months.  We actually did an echocardiogram on [name] at  [facility] earlier this year in April, at which time she had a  normal LV size and contraction, and EF of 55%.  [name] denies any  chest pain now.  She states the chest pain she had during the recent  hospitalization was a stabbing pain located in the left precordial  region with no radiation.  She denied any breathing problems during that  hospitalization.  However, she is very vague in describing the symptoms  of her fatigue and shortness of breath.  She states that she feels  fatigued all the time.  However, she has a very sedentary life-style and  she is morbidly obese.  She states she gets up in the morning, fixes  breakfast, showers, does the dishes, and she is worn out.   PAST MEDICAL HISTORY:  1. Includes paroxysmal supraventricular tachycardia, currently being      maintained on verapamil.  2. Hypertension.  3. Obstructive sleep apnea with compliance to CPAP.  4. Supposed history of diastolic CHF with an EF of 55% by      echocardiogram in April of this year.  5. Anxiety and panic attacks.  6. History of rheumatoid arthritis.  7. Bilateral carotid bruits.  No evidence of coronary artery disease      on duplex in 2003.  8. Chronic lower extremity swelling of left leg status post venous      Doppler August of 2006 that showed Valsalva appeared normal.      However, reflux was demonstrated within the common femoral vein,      greater saphenous vein, and tibial veins bilaterally.  No DVT at      that time.  9. Degenerative joint disease.  10.Gout.   REVIEW OF SYSTEMS:  As stated above in history of present illness.   ALLERGIES:  CODEINE.   CURRENT MEDICATIONS:  1. Verapamil 180 mg daily.  2. Klor-Con 10 mEq daily.  3. Paxil 30.  4. Zyrtec  10.  5. Lasix 20.  6. Metoprolol 50 b.i.d.  7. Restasis eye drops b.i.d.  8. Nasacort p.r.n.  9. Laxatives p.r.n.  10.Aspirin 325 mg daily.   PHYSICAL EXAM:  Blood pressure 140/96, heart rate 66, weight 212.  Lab  work as previously stated in recent hospitalization.  [name] is in no acute distress.  She is a 60 year old African-  American female, morbidly obese.  LUNGS:  Clear to auscultation bilaterally.  CARDIOVASCULAR:  S1, S2, regular rate and rhythm.  ABDOMEN:  Soft and nontender.  Positive bowel sounds.  LOWER EXTREMITIES:  With a trace of edema.  NEUROLOGIC:  The patient is ox.  She has a very flat affect.  Very  apprehensive.  Has a noted fine tremor.  The patient states her nerves  are bad today.   IMPRESSION:  Ongoing fatigue and shortness of breath in the setting of  morbid obesity, obstructive sleep apnea, sedentary life-style, symptoms  most likely multifactorial.  However, the patient has never had a  cardiac evaluation.  I think it would be best to proceed with a stress  Myoview for further evaluation.  [name] is  agreeable to this and  we will go ahead and set this up as soon as possible.  I have also  discussed with her the possibility of a cardiac catheterization.  I will  see her back post stress test.     Dorian Pod, ACNP  Electronically Signed    MB/MedQ  DD: 07/31/2006  DT: 07/31/2006  Job #: [id number]

## 2011-02-21 ENCOUNTER — Other Ambulatory Visit: Payer: Self-pay | Admitting: Cardiology

## 2011-04-23 ENCOUNTER — Other Ambulatory Visit: Payer: Self-pay | Admitting: Cardiology

## 2011-09-05 DIAGNOSIS — H40019 Open angle with borderline findings, low risk, unspecified eye: Secondary | ICD-10-CM | POA: Diagnosis not present

## 2011-09-05 DIAGNOSIS — I749 Embolism and thrombosis of unspecified artery: Secondary | ICD-10-CM | POA: Diagnosis not present

## 2011-09-06 DIAGNOSIS — M999 Biomechanical lesion, unspecified: Secondary | ICD-10-CM | POA: Diagnosis not present

## 2011-09-06 DIAGNOSIS — M5137 Other intervertebral disc degeneration, lumbosacral region: Secondary | ICD-10-CM | POA: Diagnosis not present

## 2011-09-06 DIAGNOSIS — IMO0002 Reserved for concepts with insufficient information to code with codable children: Secondary | ICD-10-CM | POA: Diagnosis not present

## 2011-09-13 DIAGNOSIS — M25549 Pain in joints of unspecified hand: Secondary | ICD-10-CM | POA: Diagnosis not present

## 2011-09-13 DIAGNOSIS — M1710 Unilateral primary osteoarthritis, unspecified knee: Secondary | ICD-10-CM | POA: Diagnosis not present

## 2011-09-13 DIAGNOSIS — M542 Cervicalgia: Secondary | ICD-10-CM | POA: Diagnosis not present

## 2011-09-15 ENCOUNTER — Other Ambulatory Visit: Payer: Self-pay | Admitting: Cardiology

## 2011-09-16 DIAGNOSIS — J45909 Unspecified asthma, uncomplicated: Secondary | ICD-10-CM | POA: Diagnosis present

## 2011-09-16 DIAGNOSIS — I5033 Acute on chronic diastolic (congestive) heart failure: Secondary | ICD-10-CM | POA: Diagnosis not present

## 2011-09-16 DIAGNOSIS — F3289 Other specified depressive episodes: Secondary | ICD-10-CM | POA: Diagnosis not present

## 2011-09-16 DIAGNOSIS — M25569 Pain in unspecified knee: Secondary | ICD-10-CM | POA: Diagnosis not present

## 2011-09-16 DIAGNOSIS — Z7901 Long term (current) use of anticoagulants: Secondary | ICD-10-CM | POA: Diagnosis not present

## 2011-09-16 DIAGNOSIS — E876 Hypokalemia: Secondary | ICD-10-CM | POA: Diagnosis not present

## 2011-09-16 DIAGNOSIS — Z79899 Other long term (current) drug therapy: Secondary | ICD-10-CM | POA: Diagnosis not present

## 2011-09-16 DIAGNOSIS — I5032 Chronic diastolic (congestive) heart failure: Secondary | ICD-10-CM | POA: Diagnosis not present

## 2011-09-16 DIAGNOSIS — M47817 Spondylosis without myelopathy or radiculopathy, lumbosacral region: Secondary | ICD-10-CM | POA: Diagnosis not present

## 2011-09-16 DIAGNOSIS — I509 Heart failure, unspecified: Secondary | ICD-10-CM | POA: Diagnosis present

## 2011-09-16 DIAGNOSIS — F329 Major depressive disorder, single episode, unspecified: Secondary | ICD-10-CM | POA: Diagnosis not present

## 2011-09-16 DIAGNOSIS — IMO0002 Reserved for concepts with insufficient information to code with codable children: Secondary | ICD-10-CM | POA: Diagnosis not present

## 2011-09-16 DIAGNOSIS — M47812 Spondylosis without myelopathy or radiculopathy, cervical region: Secondary | ICD-10-CM | POA: Diagnosis not present

## 2011-09-16 DIAGNOSIS — IMO0001 Reserved for inherently not codable concepts without codable children: Secondary | ICD-10-CM | POA: Diagnosis present

## 2011-09-16 DIAGNOSIS — M5020 Other cervical disc displacement, unspecified cervical region: Secondary | ICD-10-CM | POA: Diagnosis not present

## 2011-09-16 DIAGNOSIS — Z86711 Personal history of pulmonary embolism: Secondary | ICD-10-CM | POA: Diagnosis not present

## 2011-09-16 DIAGNOSIS — M109 Gout, unspecified: Secondary | ICD-10-CM | POA: Diagnosis not present

## 2011-09-16 DIAGNOSIS — M6740 Ganglion, unspecified site: Secondary | ICD-10-CM | POA: Diagnosis not present

## 2011-09-16 DIAGNOSIS — M25469 Effusion, unspecified knee: Secondary | ICD-10-CM | POA: Diagnosis not present

## 2011-09-16 DIAGNOSIS — G4730 Sleep apnea, unspecified: Secondary | ICD-10-CM | POA: Diagnosis present

## 2011-09-16 DIAGNOSIS — Z888 Allergy status to other drugs, medicaments and biological substances status: Secondary | ICD-10-CM | POA: Diagnosis not present

## 2011-09-16 DIAGNOSIS — R42 Dizziness and giddiness: Secondary | ICD-10-CM | POA: Diagnosis not present

## 2011-09-16 DIAGNOSIS — Z86718 Personal history of other venous thrombosis and embolism: Secondary | ICD-10-CM | POA: Diagnosis not present

## 2011-09-16 DIAGNOSIS — Z886 Allergy status to analgesic agent status: Secondary | ICD-10-CM | POA: Diagnosis not present

## 2011-09-16 DIAGNOSIS — R55 Syncope and collapse: Secondary | ICD-10-CM | POA: Diagnosis not present

## 2011-09-16 DIAGNOSIS — Z780 Asymptomatic menopausal state: Secondary | ICD-10-CM | POA: Diagnosis not present

## 2011-09-16 DIAGNOSIS — M4120 Other idiopathic scoliosis, site unspecified: Secondary | ICD-10-CM | POA: Diagnosis not present

## 2011-09-17 DIAGNOSIS — I5032 Chronic diastolic (congestive) heart failure: Secondary | ICD-10-CM | POA: Diagnosis not present

## 2011-09-17 DIAGNOSIS — R55 Syncope and collapse: Secondary | ICD-10-CM

## 2011-09-17 DIAGNOSIS — M109 Gout, unspecified: Secondary | ICD-10-CM | POA: Diagnosis not present

## 2011-09-17 DIAGNOSIS — F329 Major depressive disorder, single episode, unspecified: Secondary | ICD-10-CM | POA: Diagnosis not present

## 2011-09-17 DIAGNOSIS — Z86711 Personal history of pulmonary embolism: Secondary | ICD-10-CM | POA: Diagnosis not present

## 2011-09-17 DIAGNOSIS — I5033 Acute on chronic diastolic (congestive) heart failure: Secondary | ICD-10-CM

## 2011-09-17 DIAGNOSIS — E876 Hypokalemia: Secondary | ICD-10-CM | POA: Diagnosis not present

## 2011-09-18 DIAGNOSIS — M1710 Unilateral primary osteoarthritis, unspecified knee: Secondary | ICD-10-CM | POA: Diagnosis not present

## 2011-09-18 DIAGNOSIS — Z86711 Personal history of pulmonary embolism: Secondary | ICD-10-CM | POA: Diagnosis not present

## 2011-09-18 DIAGNOSIS — F329 Major depressive disorder, single episode, unspecified: Secondary | ICD-10-CM | POA: Diagnosis not present

## 2011-09-18 DIAGNOSIS — M109 Gout, unspecified: Secondary | ICD-10-CM | POA: Diagnosis not present

## 2011-09-18 DIAGNOSIS — M25569 Pain in unspecified knee: Secondary | ICD-10-CM | POA: Diagnosis not present

## 2011-09-18 DIAGNOSIS — R55 Syncope and collapse: Secondary | ICD-10-CM | POA: Diagnosis not present

## 2011-09-18 DIAGNOSIS — E876 Hypokalemia: Secondary | ICD-10-CM | POA: Diagnosis not present

## 2011-09-18 DIAGNOSIS — M25469 Effusion, unspecified knee: Secondary | ICD-10-CM | POA: Diagnosis not present

## 2011-09-18 DIAGNOSIS — I5032 Chronic diastolic (congestive) heart failure: Secondary | ICD-10-CM | POA: Diagnosis not present

## 2011-09-21 DIAGNOSIS — R072 Precordial pain: Secondary | ICD-10-CM | POA: Diagnosis not present

## 2011-09-21 DIAGNOSIS — R079 Chest pain, unspecified: Secondary | ICD-10-CM | POA: Diagnosis not present

## 2011-09-24 ENCOUNTER — Other Ambulatory Visit: Payer: Self-pay | Admitting: Cardiovascular Disease

## 2011-09-24 DIAGNOSIS — R072 Precordial pain: Secondary | ICD-10-CM

## 2011-09-25 ENCOUNTER — Encounter: Payer: Self-pay | Admitting: *Deleted

## 2011-10-04 DIAGNOSIS — K589 Irritable bowel syndrome without diarrhea: Secondary | ICD-10-CM | POA: Diagnosis not present

## 2011-10-04 DIAGNOSIS — F320 Major depressive disorder, single episode, mild: Secondary | ICD-10-CM | POA: Diagnosis not present

## 2011-10-04 DIAGNOSIS — M109 Gout, unspecified: Secondary | ICD-10-CM | POA: Diagnosis not present

## 2011-10-04 DIAGNOSIS — K921 Melena: Secondary | ICD-10-CM | POA: Diagnosis not present

## 2011-10-04 DIAGNOSIS — J209 Acute bronchitis, unspecified: Secondary | ICD-10-CM | POA: Diagnosis not present

## 2011-10-04 DIAGNOSIS — H01119 Allergic dermatitis of unspecified eye, unspecified eyelid: Secondary | ICD-10-CM | POA: Diagnosis not present

## 2011-10-04 DIAGNOSIS — I10 Essential (primary) hypertension: Secondary | ICD-10-CM | POA: Diagnosis not present

## 2011-10-09 ENCOUNTER — Encounter: Payer: Self-pay | Admitting: *Deleted

## 2011-10-10 DIAGNOSIS — I749 Embolism and thrombosis of unspecified artery: Secondary | ICD-10-CM | POA: Diagnosis not present

## 2011-10-12 ENCOUNTER — Encounter: Payer: Self-pay | Admitting: Cardiology

## 2011-10-12 ENCOUNTER — Ambulatory Visit (INDEPENDENT_AMBULATORY_CARE_PROVIDER_SITE_OTHER): Payer: Medicare Other | Admitting: Cardiology

## 2011-10-12 VITALS — BP 132/81 | HR 59 | Ht 61.0 in | Wt 202.0 lb

## 2011-10-12 DIAGNOSIS — R55 Syncope and collapse: Secondary | ICD-10-CM

## 2011-10-12 NOTE — Patient Instructions (Signed)
   Cardionet monitor x 21 days If the results of your test are normal or stable, you will receive a letter.  If they are abnormal, the nurse will contact you by phone. Follow up in  3 months - see above

## 2011-10-14 ENCOUNTER — Encounter: Payer: Self-pay | Admitting: Cardiology

## 2011-10-14 DIAGNOSIS — R55 Syncope and collapse: Secondary | ICD-10-CM | POA: Insufficient documentation

## 2011-10-14 NOTE — Progress Notes (Signed)
Dorothy Bottoms, MD, [facility] Board Certified in Adult Cardiovascular Medicine,Internal Medicine and Critical Care Medicine    CC: followup patient after recent admission for syncope  HPI:  The patient is a 61 year old female with a history of supraventricular tachycardia and diastolic heart failure, but no history of coronary artery disease.  At one point she had isorhythmic dissociation by EKG which resolved after discontinuation of verapamil as well as metoprolol.  The latter was resumed and followup EKGs were within normal limits. The patient was recently admitted with an episode of dizzy spells and apparently presyncope.  She underwent extensive testing with CT scan and was ruled out for pulmonary embolism.  She also had a stress test and echocardiogram.  However she was not discharged from the Cardionet monitor.  The patient is a prior history of pulmonary embolism about a year ago when she was on Coumadin. She states that she still has dizzy spells on occasion.  Apparently she never had a cardiac monitor done.no arrhythmias were noted however during the hospitalization.  PMH: reviewed and listed in Problem List in Electronic Records (and see below) Past Medical History  Diagnosis Date  . SVT (supraventricular tachycardia)   . Chest pain, unspecified   . Congestive heart failure, unspecified   . Depressive disorder, not elsewhere classified   . 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 Unspecified essential hypertension   . Cardiac dysrhythmia, unspecified   . Palpitations   . Chronic airway obstruction, not elsewhere classified   . Obesity, unspecified   . History of rheumatoid arthritis   . Carotid bruit     Bilateral  . Syncope     admitted 09/2011   Past Surgical History  Procedure Date  . Carpal tunnel release   . Catheter ablation 02/1997    @ [facility]  . Vein surgery bladder     Allergies/SH/FHX : available in Electronic Records for review   Allergies  Allergen Reactions  . Adhesive (Tape)   . Codeine     REACTION: vomiting   History   Social History  . Marital Status: Single    Spouse Name: N/A    Number of Children: N/A  . Years of Education: N/A   Occupational History  . Not on file.   Social History Main Topics  . Smoking status: Never Smoker   . Smokeless tobacco: Never Used  . Alcohol Use: Not on file  . Drug Use: Not on file  . Sexually Active: Not on file   Other Topics Concern  . Not on file   Social History Narrative  . No narrative on file   Family History  Problem Relation Age of Onset  . Cancer Mother   . Heart disease Mother   . Stroke      Medications: Current Outpatient Prescriptions  Medication Sig Dispense Refill  . albuterol (PROAIR HFA) 108 (90 BASE) MCG/ACT inhaler Inhale 2 puffs into the lungs every 6 (six) hours as needed.      . colchicine 0.6 MG tablet Take 0.6 mg by mouth 2 (two) times daily.      . cycloSPORINE (RESTASIS) 0.05 % ophthalmic emulsion Place 1 drop into both eyes 2 (two) times daily.      Marland Kitchen dicyclomine (BENTYL) 20 MG tablet Take 20 mg by mouth every 6 (six) hours.      . fluticasone (FLONASE) 50 MCG/ACT nasal spray Place 1 spray into the nose daily.       Marland Kitchen  LASIX 40 MG tablet TAKE (1) TABLET BY MOUTH ONCE DAILY.  30 each  6  . nystatin (NYSTOP) 100000 UNIT/GM POWD Apply topically 2 (two) times daily.      Marland Kitchen PARoxetine (PAXIL) 30 MG tablet Take 30 mg by mouth daily.      . polyethylene glycol (MIRALAX / GLYCOLAX) packet Take 17 g by mouth daily.      . potassium chloride SA (K-DUR,KLOR-CON) 20 MEQ tablet Take 20 mEq by mouth daily.      . Sodium Fluoride (SF 5000 PLUS DT) Place 1 application onto teeth as directed.      . tiotropium (SPIRIVA) 18 MCG inhalation capsule Place 18 mcg into inhaler and inhale daily.      . TOPROL XL 50 MG 24 hr tablet TAKE 1 TABLET ONCE DAILY.  30 each  6  . traMADol (ULTRAM) 50 MG tablet Take 50-100 mg by mouth 2 (two) times daily.       Marland Kitchen warfarin (COUMADIN) 5 MG tablet Take 5 mg by mouth daily. Managed by [facility] office        ROS: No nausea or vomiting. No fever or chills.No melena or hematochezia.No bleeding.No claudication  Physical Exam: BP 132/81  Pulse 59  Ht 5\' 1"  (1.549 m)  Wt 202 lb (91.627 kg)  BMI 38.17 kg/m2 General:well-nourished African American female in no distress. Neck:normal carotid upstroke and no carotid bruits.  No thyromegaly no nodular thyroid.  JVP is 5 cm Lungs:clear breath sounds bilaterally.  No wheezing Cardiac:regular rate and rhythm with normal S1-S2 no murmur rubs or gallops Vascular:no edema.  Normal distal pulses bilaterally Skin:warm and dry Physcologic:normal affect  12lead ECG:not obtained Limited bedside ECHO:N/A   Patient Active Problem List  Diagnoses  . OBESITY, UNSPECIFIED  . DEPRESSIVE DISORDER NOT ELSEWHERE CLASSIFIED  . UNSPECIFIED ESSENTIAL HYPERTENSION  . SUPRAVENTRICULAR TACHYCARDIA  . UNSPECIFIED CARDIAC DYSRHYTHMIA  . CHRONIC DIASTOLIC HEART FAILURE  . COPD  . DIAPHRAGMAT HERN W/O MENTION OBSTRUCTION/GANGREN  . RHEUMATOID LUNG  . PALPITATIONS  . SHORTNESS OF BREATH-stable  . CHEST PAIN UNSPECIFIED-no history of coronary artery disease  . Syncope-etiology unclear-rule out arrhythmia    PLAN   Patient had extensive workup during this hospitalization.  She was worked up for both pulmonary embolism and coronary artery disease.  However her symptoms are more consistent with an arrhythmia.  I have ordered a cardiac monitor.  We'll see the patient back in 3-4 weeks and review her monitor results.  In the interim no driving.

## 2011-10-15 DIAGNOSIS — M1710 Unilateral primary osteoarthritis, unspecified knee: Secondary | ICD-10-CM | POA: Diagnosis not present

## 2011-10-17 DIAGNOSIS — R55 Syncope and collapse: Secondary | ICD-10-CM

## 2011-10-18 DIAGNOSIS — M999 Biomechanical lesion, unspecified: Secondary | ICD-10-CM | POA: Diagnosis not present

## 2011-10-18 DIAGNOSIS — H40019 Open angle with borderline findings, low risk, unspecified eye: Secondary | ICD-10-CM | POA: Diagnosis not present

## 2011-10-18 DIAGNOSIS — IMO0002 Reserved for concepts with insufficient information to code with codable children: Secondary | ICD-10-CM | POA: Diagnosis not present

## 2011-10-18 DIAGNOSIS — M5137 Other intervertebral disc degeneration, lumbosacral region: Secondary | ICD-10-CM | POA: Diagnosis not present

## 2011-11-07 DIAGNOSIS — I749 Embolism and thrombosis of unspecified artery: Secondary | ICD-10-CM | POA: Diagnosis not present

## 2011-11-15 DIAGNOSIS — M5137 Other intervertebral disc degeneration, lumbosacral region: Secondary | ICD-10-CM | POA: Diagnosis not present

## 2011-11-15 DIAGNOSIS — M9981 Other biomechanical lesions of cervical region: Secondary | ICD-10-CM | POA: Diagnosis not present

## 2011-11-15 DIAGNOSIS — M999 Biomechanical lesion, unspecified: Secondary | ICD-10-CM | POA: Diagnosis not present

## 2011-11-15 DIAGNOSIS — M5030 Other cervical disc degeneration, unspecified cervical region: Secondary | ICD-10-CM | POA: Diagnosis not present

## 2011-11-29 DIAGNOSIS — K625 Hemorrhage of anus and rectum: Secondary | ICD-10-CM | POA: Diagnosis not present

## 2011-12-07 DIAGNOSIS — I749 Embolism and thrombosis of unspecified artery: Secondary | ICD-10-CM | POA: Diagnosis not present

## 2011-12-07 DIAGNOSIS — Z79899 Other long term (current) drug therapy: Secondary | ICD-10-CM | POA: Diagnosis not present

## 2011-12-20 DIAGNOSIS — J449 Chronic obstructive pulmonary disease, unspecified: Secondary | ICD-10-CM | POA: Diagnosis not present

## 2011-12-20 DIAGNOSIS — Z86711 Personal history of pulmonary embolism: Secondary | ICD-10-CM | POA: Diagnosis not present

## 2011-12-20 DIAGNOSIS — M531 Cervicobrachial syndrome: Secondary | ICD-10-CM | POA: Diagnosis not present

## 2011-12-20 DIAGNOSIS — M779 Enthesopathy, unspecified: Secondary | ICD-10-CM | POA: Diagnosis not present

## 2011-12-20 DIAGNOSIS — I10 Essential (primary) hypertension: Secondary | ICD-10-CM | POA: Diagnosis not present

## 2011-12-20 DIAGNOSIS — Z7901 Long term (current) use of anticoagulants: Secondary | ICD-10-CM | POA: Diagnosis not present

## 2011-12-20 DIAGNOSIS — M65839 Other synovitis and tenosynovitis, unspecified forearm: Secondary | ICD-10-CM | POA: Diagnosis not present

## 2011-12-20 DIAGNOSIS — M5137 Other intervertebral disc degeneration, lumbosacral region: Secondary | ICD-10-CM | POA: Diagnosis not present

## 2011-12-20 DIAGNOSIS — M9981 Other biomechanical lesions of cervical region: Secondary | ICD-10-CM | POA: Diagnosis not present

## 2011-12-20 DIAGNOSIS — Z79899 Other long term (current) drug therapy: Secondary | ICD-10-CM | POA: Diagnosis not present

## 2011-12-20 DIAGNOSIS — M999 Biomechanical lesion, unspecified: Secondary | ICD-10-CM | POA: Diagnosis not present

## 2011-12-24 ENCOUNTER — Other Ambulatory Visit: Payer: Self-pay | Admitting: Cardiology

## 2011-12-27 ENCOUNTER — Encounter: Payer: Self-pay | Admitting: *Deleted

## 2011-12-31 DIAGNOSIS — M5137 Other intervertebral disc degeneration, lumbosacral region: Secondary | ICD-10-CM | POA: Diagnosis not present

## 2011-12-31 DIAGNOSIS — I10 Essential (primary) hypertension: Secondary | ICD-10-CM | POA: Diagnosis not present

## 2011-12-31 DIAGNOSIS — M999 Biomechanical lesion, unspecified: Secondary | ICD-10-CM | POA: Diagnosis not present

## 2011-12-31 DIAGNOSIS — M9981 Other biomechanical lesions of cervical region: Secondary | ICD-10-CM | POA: Diagnosis not present

## 2011-12-31 DIAGNOSIS — M531 Cervicobrachial syndrome: Secondary | ICD-10-CM | POA: Diagnosis not present

## 2012-01-07 DIAGNOSIS — I749 Embolism and thrombosis of unspecified artery: Secondary | ICD-10-CM | POA: Diagnosis not present

## 2012-01-11 ENCOUNTER — Encounter: Payer: Self-pay | Admitting: Cardiology

## 2012-01-11 ENCOUNTER — Ambulatory Visit (INDEPENDENT_AMBULATORY_CARE_PROVIDER_SITE_OTHER): Payer: Medicare Other | Admitting: Cardiology

## 2012-01-11 VITALS — BP 126/83 | HR 68 | Ht 61.0 in | Wt 209.0 lb

## 2012-01-11 DIAGNOSIS — E876 Hypokalemia: Secondary | ICD-10-CM

## 2012-01-11 DIAGNOSIS — I10 Essential (primary) hypertension: Secondary | ICD-10-CM

## 2012-01-11 DIAGNOSIS — F419 Anxiety disorder, unspecified: Secondary | ICD-10-CM

## 2012-01-11 DIAGNOSIS — R000 Tachycardia, unspecified: Secondary | ICD-10-CM

## 2012-01-11 DIAGNOSIS — I5032 Chronic diastolic (congestive) heart failure: Secondary | ICD-10-CM

## 2012-01-11 DIAGNOSIS — R002 Palpitations: Secondary | ICD-10-CM

## 2012-01-11 DIAGNOSIS — I498 Other specified cardiac arrhythmias: Secondary | ICD-10-CM

## 2012-01-11 DIAGNOSIS — I509 Heart failure, unspecified: Secondary | ICD-10-CM | POA: Diagnosis not present

## 2012-01-11 DIAGNOSIS — R079 Chest pain, unspecified: Secondary | ICD-10-CM

## 2012-01-11 DIAGNOSIS — F411 Generalized anxiety disorder: Secondary | ICD-10-CM

## 2012-01-11 DIAGNOSIS — I2699 Other pulmonary embolism without acute cor pulmonale: Secondary | ICD-10-CM

## 2012-01-11 MED ORDER — CLONAZEPAM 0.5 MG PO TABS: 0.2500 mg | ORAL_TABLET | Freq: Every morning | ORAL | Status: DC

## 2012-01-11 MED ORDER — TRAZODONE HCL 50 MG PO TABS: 50.0000 mg | ORAL_TABLET | Freq: Every day | ORAL | Status: DC

## 2012-01-11 MED ORDER — METOPROLOL SUCCINATE ER 100 MG PO TB24: 100.0000 mg | ORAL_TABLET | Freq: Every day | ORAL | Status: DC

## 2012-01-11 NOTE — Patient Instructions (Signed)
   Increase Toprol XL to 100mg  daily (may take 2 tabs of the 50mg  till finish current supply)  Clonazepam 0.25mg  every morning - follow with primary MD for maintenance if this medication is helpful  Trazodone 50mg  every evening - follow with primary MD for maintenance if this medication is helpful Your physician recommends that you go to the [facility] lab for BMET & Magnesium. If the results of your test are normal or stable, you will receive a letter.  If they are abnormal, the nurse will contact you by phone. Your physician wants you to follow up in: 6 months.  You will receive a reminder letter in the mail one-two months in advance.  If you don't receive a letter, please call our office to schedule the follow up appointment

## 2012-01-15 DIAGNOSIS — H40019 Open angle with borderline findings, low risk, unspecified eye: Secondary | ICD-10-CM | POA: Diagnosis not present

## 2012-01-15 DIAGNOSIS — H3589 Other specified retinal disorders: Secondary | ICD-10-CM | POA: Diagnosis not present

## 2012-01-15 DIAGNOSIS — H40039 Anatomical narrow angle, unspecified eye: Secondary | ICD-10-CM | POA: Diagnosis not present

## 2012-01-16 ENCOUNTER — Encounter: Payer: Self-pay | Admitting: *Deleted

## 2012-01-18 DIAGNOSIS — J449 Chronic obstructive pulmonary disease, unspecified: Secondary | ICD-10-CM | POA: Diagnosis not present

## 2012-01-18 DIAGNOSIS — J8410 Pulmonary fibrosis, unspecified: Secondary | ICD-10-CM | POA: Diagnosis not present

## 2012-01-18 DIAGNOSIS — I2699 Other pulmonary embolism without acute cor pulmonale: Secondary | ICD-10-CM | POA: Insufficient documentation

## 2012-01-18 DIAGNOSIS — F419 Anxiety disorder, unspecified: Secondary | ICD-10-CM | POA: Insufficient documentation

## 2012-01-18 NOTE — Assessment & Plan Note (Signed)
I recommended to the patient to take Clonazepam 0.25 mg in the morning and trazodone 50 mg in the evening.  She can follow up with her primary care physician regarding the specific problem in the future.

## 2012-01-18 NOTE — Progress Notes (Signed)
Peyton Bottoms, MD, [facility] ABIM Board Certified in Adult Cardiovascular Medicine,Internal Medicine and Critical Care Medicine    CC: Followup patient with a history of diastolic heart failure.  History of palpitations and superventricular tachycardia  HPI:  The patient is a 61 year old female with a history of diastolic heart failure, supraventricular tachycardia and isorhythmic Dissociation.  The latter occurred while she was on a combination of calcium channel blockers and beta blockers.  She has had no heart failure exacerbations.  She had a negative for ischemia workup.  She reports no chest pain.  Main problems center around anxiety and increased stress in her family life which is associated with palpitations and feeling that her heart is racing.  There has been no associated presyncope or syncope.  She denies any orthopnea or PND.  She has not required any hospitalizations for heart failure.  The patient also reports significant insomnia.   PMH: reviewed and listed in Problem List in Electronic Records (and see below) Past Medical History  Diagnosis Date  . SVT (supraventricular tachycardia)   . Chest pain, unspecified   . Congestive heart failure, unspecified   . Depressive disorder, not elsewhere classified   . 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 Unspecified essential hypertension   . Cardiac dysrhythmia, unspecified   . Palpitations   . Chronic airway obstruction, not elsewhere classified   . Obesity, unspecified   . History of rheumatoid arthritis   . Carotid bruit     Bilateral  . Syncope     admitted 09/2011   Past Surgical History  Procedure Date  . Carpal tunnel release   . Catheter ablation 02/1997    @ [facility]  . Vein surgery bladder     Allergies/SH/FHX : available in Electronic Records for review  Allergies  Allergen Reactions  . Adhesive (Tape)   . Codeine     REACTION: vomiting   History   Social History  . Marital  Status: Single    Spouse Name: N/A    Number of Children: N/A  . Years of Education: N/A   Occupational History  . Not on file.   Social History Main Topics  . Smoking status: Never Smoker   . Smokeless tobacco: Never Used  . Alcohol Use: Not on file  . Drug Use: Not on file  . Sexually Active: Not on file   Other Topics Concern  . Not on file   Social History Narrative  . No narrative on file   Family History  Problem Relation Age of Onset  . Cancer Mother   . Heart disease Mother   . Stroke      Medications: Current Outpatient Prescriptions  Medication Sig Dispense Refill  . albuterol (PROAIR HFA) 108 (90 BASE) MCG/ACT inhaler Inhale 2 puffs into the lungs every 6 (six) hours as needed.      . Calcium Carbonate-Vitamin D (CALTRATE 600+D) 600-400 MG-UNIT per tablet Take 1 tablet by mouth 2 (two) times daily.      . colchicine 0.6 MG tablet Take 0.6 mg by mouth 2 (two) times daily.      . cycloSPORINE (RESTASIS) 0.05 % ophthalmic emulsion Place 1 drop into both eyes 2 (two) times daily.      Marland Kitchen dicyclomine (BENTYL) 20 MG tablet Take 20 mg by mouth every 6 (six) hours.      . fluticasone (FLONASE) 50 MCG/ACT nasal spray Place 1 spray into the nose daily.       Marland Kitchen  LASIX 40 MG tablet TAKE (1) TABLET BY MOUTH ONCE DAILY.  30 each  6  . loratadine (CLARITIN) 10 MG tablet Take 10 mg by mouth daily.      . metoprolol succinate (TOPROL XL) 100 MG 24 hr tablet Take 1 tablet (100 mg total) by mouth daily.  30 tablet  6  . PARoxetine (PAXIL) 30 MG tablet Take 30 mg by mouth daily.      . potassium chloride SA (K-DUR,KLOR-CON) 20 MEQ tablet Take 20 mEq by mouth daily.      . ranitidine (ZANTAC) 150 MG tablet Take 150 mg by mouth daily.      . Simethicone (GAS-X EXTRA STRENGTH) 125 MG CAPS Take 2 capsules by mouth as needed.      . tiotropium (SPIRIVA) 18 MCG inhalation capsule Place 18 mcg into inhaler and inhale daily.      . traMADol (ULTRAM) 50 MG tablet Take 50-100 mg by mouth 2  (two) times daily.      Marland Kitchen warfarin (COUMADIN) 5 MG tablet Take 5 mg by mouth daily. Managed by [facility] office      . clonazePAM (KLONOPIN) 0.5 MG tablet Take 0.5 tablets (0.25 mg total) by mouth every morning.  30 tablet  1  . traZODone (DESYREL) 50 MG tablet Take 1 tablet (50 mg total) by mouth at bedtime.  30 tablet  3    ROS: No nausea or vomiting. No fever or chills.No melena or hematochezia.No bleeding.No claudication  Physical Exam: BP 126/83  Pulse 68  Ht 5\' 1"  (1.549 m)  Wt 209 lb (94.802 kg)  BMI 39.49 kg/m2 General:Well-nourished female in no distress. Neck:Normal carotid upstroke and no carotid bruits.  No thyromegaly.  No nodular thyroid.  JVP is 5-6 cm Lungs:Clear breath sounds bilaterally no wheezing Cardiac:Regular rate and rhythm with normal S1-S2 no murmur rubs or gallops Vascular:No edema.  Normal distal pulses. Skin:Warm and dry Physcologic:Anxious  12lead ECG:Not obtained Limited bedside ECHO:N/A No images are attached to the encounter.   Assessment and Plan  UNSPECIFIED ESSENTIAL HYPERTENSION Blood pressure well controlled.  SUPRAVENTRICULAR TACHYCARDIA Patient reports some racing of her heart.  This occurs during stress.  He doesn't appear however clinically that she has recurrent SVT but rather may be related to anxiety.  No associated dizziness or syncope.  Increase metoprolol XL 200 g by mouth daily.  PALPITATIONS Mainly occurring during anxiety and stress.  Patient also has a history of IBS.  Chronic diastolic heart failure No evidence of volume overload.  We'll continue her on her current medical regimen.  The patient has not required any admissions for heart failure.  Pulmonary embolism No clinical suspicion of pulmonary embolism at this point  CHEST PAIN UNSPECIFIED No further ischemia workup is required at the present time  Anxiety I recommended to the patient to take Clonazepam 0.25 mg in the morning and trazodone 50 mg in the evening.   She can follow up with her primary care physician regarding the specific problem in the future.    Patient Active Problem List  Diagnoses  . OBESITY, UNSPECIFIED  . DEPRESSIVE DISORDER NOT ELSEWHERE CLASSIFIED  . UNSPECIFIED ESSENTIAL HYPERTENSION  . SUPRAVENTRICULAR TACHYCARDIA  . Chronic diastolic heart failure  . COPD  . DIAPHRAGMAT HERN W/O MENTION OBSTRUCTION/GANGREN  . RHEUMATOID LUNG  . PALPITATIONS  . CHEST PAIN UNSPECIFIED  . Syncope  . Pulmonary embolism  . Anxiety

## 2012-01-18 NOTE — Assessment & Plan Note (Signed)
No evidence of volume overload.  We'll continue her on her current medical regimen.  The patient has not required any admissions for heart failure.

## 2012-01-18 NOTE — Assessment & Plan Note (Signed)
Patient reports some racing of her heart.  This occurs during stress.  He doesn't appear however clinically that she has recurrent SVT but rather may be related to anxiety.  No associated dizziness or syncope.  Increase metoprolol XL 200 g by mouth daily.

## 2012-01-18 NOTE — Assessment & Plan Note (Signed)
No clinical suspicion of pulmonary embolism at this point

## 2012-01-18 NOTE — Assessment & Plan Note (Signed)
Blood pressure well controlled

## 2012-01-18 NOTE — Assessment & Plan Note (Signed)
No further ischemia workup is required at the present time

## 2012-01-18 NOTE — Assessment & Plan Note (Signed)
Mainly occurring during anxiety and stress.  Patient also has a history of IBS.

## 2012-01-29 DIAGNOSIS — M5137 Other intervertebral disc degeneration, lumbosacral region: Secondary | ICD-10-CM | POA: Diagnosis not present

## 2012-01-29 DIAGNOSIS — M999 Biomechanical lesion, unspecified: Secondary | ICD-10-CM | POA: Diagnosis not present

## 2012-01-29 DIAGNOSIS — IMO0002 Reserved for concepts with insufficient information to code with codable children: Secondary | ICD-10-CM | POA: Diagnosis not present

## 2012-02-01 DIAGNOSIS — R079 Chest pain, unspecified: Secondary | ICD-10-CM | POA: Diagnosis not present

## 2012-02-01 DIAGNOSIS — Z7901 Long term (current) use of anticoagulants: Secondary | ICD-10-CM | POA: Diagnosis not present

## 2012-02-01 DIAGNOSIS — Z86711 Personal history of pulmonary embolism: Secondary | ICD-10-CM | POA: Diagnosis not present

## 2012-02-01 DIAGNOSIS — I10 Essential (primary) hypertension: Secondary | ICD-10-CM | POA: Diagnosis not present

## 2012-02-01 DIAGNOSIS — Z79899 Other long term (current) drug therapy: Secondary | ICD-10-CM | POA: Diagnosis not present

## 2012-02-01 DIAGNOSIS — Z885 Allergy status to narcotic agent status: Secondary | ICD-10-CM | POA: Diagnosis not present

## 2012-02-01 DIAGNOSIS — I5032 Chronic diastolic (congestive) heart failure: Secondary | ICD-10-CM | POA: Diagnosis not present

## 2012-02-01 DIAGNOSIS — M109 Gout, unspecified: Secondary | ICD-10-CM | POA: Diagnosis not present

## 2012-02-01 DIAGNOSIS — R05 Cough: Secondary | ICD-10-CM | POA: Diagnosis not present

## 2012-02-01 DIAGNOSIS — Z8249 Family history of ischemic heart disease and other diseases of the circulatory system: Secondary | ICD-10-CM | POA: Diagnosis not present

## 2012-02-01 DIAGNOSIS — F329 Major depressive disorder, single episode, unspecified: Secondary | ICD-10-CM | POA: Diagnosis not present

## 2012-02-01 DIAGNOSIS — Z780 Asymptomatic menopausal state: Secondary | ICD-10-CM | POA: Diagnosis not present

## 2012-02-01 DIAGNOSIS — Z9109 Other allergy status, other than to drugs and biological substances: Secondary | ICD-10-CM | POA: Diagnosis not present

## 2012-02-01 DIAGNOSIS — R42 Dizziness and giddiness: Secondary | ICD-10-CM | POA: Diagnosis not present

## 2012-02-01 DIAGNOSIS — R0789 Other chest pain: Secondary | ICD-10-CM | POA: Diagnosis not present

## 2012-02-01 DIAGNOSIS — IMO0001 Reserved for inherently not codable concepts without codable children: Secondary | ICD-10-CM | POA: Diagnosis not present

## 2012-02-01 DIAGNOSIS — R062 Wheezing: Secondary | ICD-10-CM | POA: Diagnosis not present

## 2012-02-01 DIAGNOSIS — J45901 Unspecified asthma with (acute) exacerbation: Secondary | ICD-10-CM | POA: Diagnosis not present

## 2012-02-01 DIAGNOSIS — I509 Heart failure, unspecified: Secondary | ICD-10-CM | POA: Diagnosis not present

## 2012-02-02 DIAGNOSIS — R079 Chest pain, unspecified: Secondary | ICD-10-CM | POA: Diagnosis not present

## 2012-02-02 DIAGNOSIS — J45901 Unspecified asthma with (acute) exacerbation: Secondary | ICD-10-CM | POA: Diagnosis not present

## 2012-02-02 DIAGNOSIS — M109 Gout, unspecified: Secondary | ICD-10-CM | POA: Diagnosis not present

## 2012-02-02 DIAGNOSIS — I10 Essential (primary) hypertension: Secondary | ICD-10-CM | POA: Diagnosis not present

## 2012-02-03 DIAGNOSIS — R079 Chest pain, unspecified: Secondary | ICD-10-CM | POA: Diagnosis not present

## 2012-02-03 DIAGNOSIS — J45901 Unspecified asthma with (acute) exacerbation: Secondary | ICD-10-CM | POA: Diagnosis not present

## 2012-02-03 DIAGNOSIS — I10 Essential (primary) hypertension: Secondary | ICD-10-CM | POA: Diagnosis not present

## 2012-02-03 DIAGNOSIS — M109 Gout, unspecified: Secondary | ICD-10-CM | POA: Diagnosis not present

## 2012-02-18 DIAGNOSIS — J45909 Unspecified asthma, uncomplicated: Secondary | ICD-10-CM | POA: Diagnosis not present

## 2012-02-20 DIAGNOSIS — M1710 Unilateral primary osteoarthritis, unspecified knee: Secondary | ICD-10-CM | POA: Diagnosis not present

## 2012-02-20 DIAGNOSIS — M1610 Unilateral primary osteoarthritis, unspecified hip: Secondary | ICD-10-CM | POA: Diagnosis not present

## 2012-02-20 DIAGNOSIS — M19049 Primary osteoarthritis, unspecified hand: Secondary | ICD-10-CM | POA: Diagnosis not present

## 2012-02-26 DIAGNOSIS — M5137 Other intervertebral disc degeneration, lumbosacral region: Secondary | ICD-10-CM | POA: Diagnosis not present

## 2012-02-26 DIAGNOSIS — IMO0002 Reserved for concepts with insufficient information to code with codable children: Secondary | ICD-10-CM | POA: Diagnosis not present

## 2012-02-26 DIAGNOSIS — M999 Biomechanical lesion, unspecified: Secondary | ICD-10-CM | POA: Diagnosis not present

## 2012-02-27 DIAGNOSIS — R209 Unspecified disturbances of skin sensation: Secondary | ICD-10-CM | POA: Diagnosis not present

## 2012-02-27 DIAGNOSIS — I10 Essential (primary) hypertension: Secondary | ICD-10-CM | POA: Diagnosis not present

## 2012-02-27 DIAGNOSIS — G4733 Obstructive sleep apnea (adult) (pediatric): Secondary | ICD-10-CM | POA: Diagnosis not present

## 2012-03-10 DIAGNOSIS — I749 Embolism and thrombosis of unspecified artery: Secondary | ICD-10-CM | POA: Diagnosis not present

## 2012-03-12 ENCOUNTER — Other Ambulatory Visit: Payer: Self-pay | Admitting: Cardiology

## 2012-03-18 DIAGNOSIS — H3589 Other specified retinal disorders: Secondary | ICD-10-CM | POA: Diagnosis not present

## 2012-03-18 DIAGNOSIS — H269 Unspecified cataract: Secondary | ICD-10-CM | POA: Diagnosis not present

## 2012-03-18 DIAGNOSIS — H40039 Anatomical narrow angle, unspecified eye: Secondary | ICD-10-CM | POA: Diagnosis not present

## 2012-03-27 DIAGNOSIS — M5030 Other cervical disc degeneration, unspecified cervical region: Secondary | ICD-10-CM | POA: Diagnosis not present

## 2012-03-27 DIAGNOSIS — M999 Biomechanical lesion, unspecified: Secondary | ICD-10-CM | POA: Diagnosis not present

## 2012-03-27 DIAGNOSIS — IMO0002 Reserved for concepts with insufficient information to code with codable children: Secondary | ICD-10-CM | POA: Diagnosis not present

## 2012-03-27 DIAGNOSIS — M9981 Other biomechanical lesions of cervical region: Secondary | ICD-10-CM | POA: Diagnosis not present

## 2012-04-08 DIAGNOSIS — I749 Embolism and thrombosis of unspecified artery: Secondary | ICD-10-CM | POA: Diagnosis not present

## 2012-04-18 DIAGNOSIS — R042 Hemoptysis: Secondary | ICD-10-CM | POA: Diagnosis not present

## 2012-04-18 DIAGNOSIS — J449 Chronic obstructive pulmonary disease, unspecified: Secondary | ICD-10-CM | POA: Diagnosis not present

## 2012-04-23 DIAGNOSIS — M546 Pain in thoracic spine: Secondary | ICD-10-CM | POA: Diagnosis not present

## 2012-04-23 DIAGNOSIS — M999 Biomechanical lesion, unspecified: Secondary | ICD-10-CM | POA: Diagnosis not present

## 2012-04-23 DIAGNOSIS — M5137 Other intervertebral disc degeneration, lumbosacral region: Secondary | ICD-10-CM | POA: Diagnosis not present

## 2012-05-02 DIAGNOSIS — R1013 Epigastric pain: Secondary | ICD-10-CM | POA: Diagnosis not present

## 2012-05-02 DIAGNOSIS — Z7901 Long term (current) use of anticoagulants: Secondary | ICD-10-CM | POA: Diagnosis not present

## 2012-05-08 DIAGNOSIS — R109 Unspecified abdominal pain: Secondary | ICD-10-CM | POA: Diagnosis not present

## 2012-05-08 DIAGNOSIS — R110 Nausea: Secondary | ICD-10-CM | POA: Diagnosis not present

## 2012-05-21 ENCOUNTER — Other Ambulatory Visit: Payer: Self-pay | Admitting: Cardiology

## 2012-05-21 DIAGNOSIS — Z7901 Long term (current) use of anticoagulants: Secondary | ICD-10-CM | POA: Diagnosis not present

## 2012-05-21 DIAGNOSIS — Z79899 Other long term (current) drug therapy: Secondary | ICD-10-CM | POA: Diagnosis not present

## 2012-05-21 NOTE — Telephone Encounter (Signed)
Dr. Andee Lineman no longer with [facility].  At last OV patient was instructed to follow up with PMD regarding future needs for this medication.

## 2012-05-30 DIAGNOSIS — R21 Rash and other nonspecific skin eruption: Secondary | ICD-10-CM | POA: Diagnosis not present

## 2012-06-04 DIAGNOSIS — Z79899 Other long term (current) drug therapy: Secondary | ICD-10-CM | POA: Diagnosis not present

## 2012-06-11 DIAGNOSIS — Z7901 Long term (current) use of anticoagulants: Secondary | ICD-10-CM | POA: Diagnosis not present

## 2012-06-18 DIAGNOSIS — M999 Biomechanical lesion, unspecified: Secondary | ICD-10-CM | POA: Diagnosis not present

## 2012-06-18 DIAGNOSIS — M5137 Other intervertebral disc degeneration, lumbosacral region: Secondary | ICD-10-CM | POA: Diagnosis not present

## 2012-06-18 DIAGNOSIS — M546 Pain in thoracic spine: Secondary | ICD-10-CM | POA: Diagnosis not present

## 2012-07-02 DIAGNOSIS — Z7901 Long term (current) use of anticoagulants: Secondary | ICD-10-CM | POA: Diagnosis not present

## 2012-07-04 DIAGNOSIS — J209 Acute bronchitis, unspecified: Secondary | ICD-10-CM | POA: Diagnosis not present

## 2012-07-15 DIAGNOSIS — M25519 Pain in unspecified shoulder: Secondary | ICD-10-CM | POA: Diagnosis not present

## 2012-07-15 DIAGNOSIS — J449 Chronic obstructive pulmonary disease, unspecified: Secondary | ICD-10-CM | POA: Diagnosis not present

## 2012-07-15 DIAGNOSIS — R059 Cough, unspecified: Secondary | ICD-10-CM | POA: Diagnosis not present

## 2012-07-15 DIAGNOSIS — R52 Pain, unspecified: Secondary | ICD-10-CM | POA: Diagnosis not present

## 2012-07-15 DIAGNOSIS — R6889 Other general symptoms and signs: Secondary | ICD-10-CM | POA: Diagnosis not present

## 2012-07-15 DIAGNOSIS — M79609 Pain in unspecified limb: Secondary | ICD-10-CM | POA: Diagnosis not present

## 2012-07-15 DIAGNOSIS — Z79899 Other long term (current) drug therapy: Secondary | ICD-10-CM | POA: Diagnosis not present

## 2012-07-15 DIAGNOSIS — I10 Essential (primary) hypertension: Secondary | ICD-10-CM | POA: Diagnosis not present

## 2012-07-15 DIAGNOSIS — Z86711 Personal history of pulmonary embolism: Secondary | ICD-10-CM | POA: Diagnosis not present

## 2012-07-15 DIAGNOSIS — IMO0001 Reserved for inherently not codable concepts without codable children: Secondary | ICD-10-CM | POA: Diagnosis not present

## 2012-07-15 DIAGNOSIS — Z7901 Long term (current) use of anticoagulants: Secondary | ICD-10-CM | POA: Diagnosis not present

## 2012-07-16 DIAGNOSIS — M999 Biomechanical lesion, unspecified: Secondary | ICD-10-CM | POA: Diagnosis not present

## 2012-07-16 DIAGNOSIS — M546 Pain in thoracic spine: Secondary | ICD-10-CM | POA: Diagnosis not present

## 2012-07-16 DIAGNOSIS — M5137 Other intervertebral disc degeneration, lumbosacral region: Secondary | ICD-10-CM | POA: Diagnosis not present

## 2012-07-18 DIAGNOSIS — J449 Chronic obstructive pulmonary disease, unspecified: Secondary | ICD-10-CM | POA: Diagnosis not present

## 2012-07-18 DIAGNOSIS — J441 Chronic obstructive pulmonary disease with (acute) exacerbation: Secondary | ICD-10-CM | POA: Diagnosis not present

## 2012-07-23 DIAGNOSIS — M5137 Other intervertebral disc degeneration, lumbosacral region: Secondary | ICD-10-CM | POA: Diagnosis not present

## 2012-07-23 DIAGNOSIS — M546 Pain in thoracic spine: Secondary | ICD-10-CM | POA: Diagnosis not present

## 2012-07-23 DIAGNOSIS — M999 Biomechanical lesion, unspecified: Secondary | ICD-10-CM | POA: Diagnosis not present

## 2012-07-23 DIAGNOSIS — Z7901 Long term (current) use of anticoagulants: Secondary | ICD-10-CM | POA: Diagnosis not present

## 2012-07-26 DIAGNOSIS — Z23 Encounter for immunization: Secondary | ICD-10-CM | POA: Diagnosis not present

## 2012-07-30 DIAGNOSIS — M546 Pain in thoracic spine: Secondary | ICD-10-CM | POA: Diagnosis not present

## 2012-07-30 DIAGNOSIS — M5137 Other intervertebral disc degeneration, lumbosacral region: Secondary | ICD-10-CM | POA: Diagnosis not present

## 2012-07-30 DIAGNOSIS — M999 Biomechanical lesion, unspecified: Secondary | ICD-10-CM | POA: Diagnosis not present

## 2012-08-06 DIAGNOSIS — Z7901 Long term (current) use of anticoagulants: Secondary | ICD-10-CM | POA: Diagnosis not present

## 2012-08-11 ENCOUNTER — Other Ambulatory Visit: Payer: Self-pay | Admitting: Internal Medicine

## 2012-08-11 DIAGNOSIS — Z01419 Encounter for gynecological examination (general) (routine) without abnormal findings: Secondary | ICD-10-CM | POA: Diagnosis not present

## 2012-08-12 ENCOUNTER — Encounter: Payer: Self-pay | Admitting: Cardiology

## 2012-08-12 ENCOUNTER — Ambulatory Visit (INDEPENDENT_AMBULATORY_CARE_PROVIDER_SITE_OTHER): Payer: Medicare Other | Admitting: Cardiology

## 2012-08-12 VITALS — BP 131/79 | HR 65 | Ht 61.0 in | Wt 204.8 lb

## 2012-08-12 DIAGNOSIS — I10 Essential (primary) hypertension: Secondary | ICD-10-CM

## 2012-08-12 DIAGNOSIS — I5032 Chronic diastolic (congestive) heart failure: Secondary | ICD-10-CM | POA: Diagnosis not present

## 2012-08-12 DIAGNOSIS — R0602 Shortness of breath: Secondary | ICD-10-CM | POA: Diagnosis not present

## 2012-08-12 DIAGNOSIS — Z79899 Other long term (current) drug therapy: Secondary | ICD-10-CM | POA: Diagnosis not present

## 2012-08-12 DIAGNOSIS — I2699 Other pulmonary embolism without acute cor pulmonale: Secondary | ICD-10-CM

## 2012-08-12 DIAGNOSIS — I498 Other specified cardiac arrhythmias: Secondary | ICD-10-CM

## 2012-08-12 NOTE — Progress Notes (Signed)
Patient ID: Dorothy Ford, female   DOB: May 14, 1951, 61 y.o.   MRN: [id number] PCP: Dr. Olena Leatherwood  61 yo with history of diastolic CHF, prior SVT with catheter ablation in 1998 (I do not have full details on this), and history of spontaneous DVT/PE presents for cardiology followup.  Patient has been stable since last appointment.  She is short of breath walking up a flight of stairs but does ok walking on flat ground.  She fatigues easily.  No chest pain.  No tachypalpitations/lightheadedness/syncope.    Labs (5/13): K 4.1, creatinine 0.94  PMH: 1. Diastolic CHF: Echo (1/13) with EF 55-60%, grade I diastolic dysfunction, normal RV size and systolic function.  2. SVT: s/p catheter ablation at [facility] in 1998.  3. Depression. 4. Obesity 5. Rheumatoid arthritis 6. Syncope in 1/13, thought to be related to hypokalemia. 7. DVT and PE in 2012, not post-surgical.  8. Cardiolite in 2007 was normal.  9. OSA 10. Asthma/obstructive lung disease.  11. Pseudogout  SH: Nonsmoker, on disability, lives in [place].   FH: Mother with "heart problem."   Current Outpatient Prescriptions  Medication Sig Dispense Refill  . albuterol (PROAIR HFA) 108 (90 BASE) MCG/ACT inhaler Inhale 2 puffs into the lungs 4 (four) times daily.       . benzonatate (TESSALON) 100 MG capsule Take 100 mg by mouth 3 (three) times daily as needed.      . budesonide-formoterol (SYMBICORT) 160-4.5 MCG/ACT inhaler Inhale 2 puffs into the lungs 2 (two) times daily.      . Calcium Carbonate-Vitamin D (CALTRATE 600+D) 600-400 MG-UNIT per tablet Take 1 tablet by mouth 2 (two) times daily.      . clonazePAM (KLONOPIN) 0.5 MG tablet Take 0.5 tablets (0.25 mg total) by mouth every morning.  30 tablet  1  . colchicine 0.6 MG tablet Take 0.6 mg by mouth 2 (two) times daily.      . cycloSPORINE (RESTASIS) 0.05 % ophthalmic emulsion Place 1 drop into both eyes 2 (two) times daily.      . fluticasone (FLONASE) 50 MCG/ACT nasal spray Place 1 spray  into the nose daily.       Marland Kitchen LASIX 40 MG tablet TAKE (1) TABLET BY MOUTH ONCE DAILY.  30 each  6  . loratadine (CLARITIN) 10 MG tablet Take 10 mg by mouth as needed.       . metoprolol succinate (TOPROL XL) 100 MG 24 hr tablet Take 1 tablet (100 mg total) by mouth daily.  30 tablet  6  . nystatin (MYCOSTATIN) powder Apply topically as needed.      Marland Kitchen PARoxetine (PAXIL) 30 MG tablet Take 30 mg by mouth daily.      . potassium chloride SA (K-DUR,KLOR-CON) 20 MEQ tablet Take 20 mEq by mouth daily.      . ranitidine (ZANTAC) 150 MG tablet Take 150 mg by mouth daily.      . Simethicone (GAS-X EXTRA STRENGTH) 125 MG CAPS Take 2 capsules by mouth as needed.      . tiotropium (SPIRIVA) 18 MCG inhalation capsule Place 18 mcg into inhaler and inhale daily.      . traMADol (ULTRAM) 50 MG tablet Take 50-100 mg by mouth 2 (two) times daily.      . traZODone (DESYREL) 50 MG tablet Take 1 tablet (50 mg total) by mouth at bedtime.  30 tablet  3  . triamcinolone cream (KENALOG) 0.1 % Apply 1 application topically as needed.       Marland Kitchen  warfarin (COUMADIN) 5 MG tablet Take 5 mg by mouth daily. Managed by [facility] office        BP 131/79  Pulse 65  Ht 5\' 1"  (1.549 m)  Wt 204 lb 12.8 oz (92.897 kg)  BMI 38.70 kg/m2 General: NAD, obese.  Neck: No JVD, no thyromegaly or thyroid nodule.  Lungs: Clear to auscultation bilaterally with normal respiratory effort. CV: Nondisplaced PMI.  Heart regular S1/S2, no S3/S4, no murmur.  No peripheral edema.  No carotid bruit.  Normal pedal pulses.  Abdomen: Soft, nontender, no hepatosplenomegaly, no distention.  Neurologic: Alert and oriented x 3.  Psych: Normal affect. Extremities: No clubbing or cyanosis.    Assessment/Plan: 1. Diastolic CHF: Chronic.  Patient does not appear volume overloaded today.  Stable NYHA class II dyspnea.  I think that obesity/deconditioning likely play a significant role.   - Continue current Lasix and KCl. - Check BMET/BNP.  2. H/o SVT: s/p  ablation in 1998.  No further details available.  No recent tachypalpitations. Continue Toprol XL. 3. H/o PE/DVT: Continue warfarin long-term, had untriggered VTE.   Marca Ancona 08/13/2012 11:34 AM

## 2012-08-12 NOTE — Patient Instructions (Signed)
Continue all current medications. Lab - BMET, BNP Office will contact with results Your physician wants you to follow up in: 6 months.  You will receive a reminder letter in the mail one-two months in advance.  If you don't receive a letter, please call our office to schedule the follow up appointment

## 2012-08-13 DIAGNOSIS — M1710 Unilateral primary osteoarthritis, unspecified knee: Secondary | ICD-10-CM | POA: Diagnosis not present

## 2012-08-13 DIAGNOSIS — M19049 Primary osteoarthritis, unspecified hand: Secondary | ICD-10-CM | POA: Diagnosis not present

## 2012-08-14 DIAGNOSIS — Z79899 Other long term (current) drug therapy: Secondary | ICD-10-CM | POA: Diagnosis not present

## 2012-08-14 DIAGNOSIS — I10 Essential (primary) hypertension: Secondary | ICD-10-CM | POA: Diagnosis not present

## 2012-08-14 DIAGNOSIS — I5032 Chronic diastolic (congestive) heart failure: Secondary | ICD-10-CM | POA: Diagnosis not present

## 2012-08-14 DIAGNOSIS — R0602 Shortness of breath: Secondary | ICD-10-CM | POA: Diagnosis not present

## 2012-08-21 DIAGNOSIS — Z7901 Long term (current) use of anticoagulants: Secondary | ICD-10-CM | POA: Diagnosis not present

## 2012-09-01 DIAGNOSIS — Z7901 Long term (current) use of anticoagulants: Secondary | ICD-10-CM | POA: Diagnosis not present

## 2012-09-07 DIAGNOSIS — Z86711 Personal history of pulmonary embolism: Secondary | ICD-10-CM | POA: Diagnosis not present

## 2012-09-07 DIAGNOSIS — R0602 Shortness of breath: Secondary | ICD-10-CM | POA: Diagnosis not present

## 2012-09-07 DIAGNOSIS — I10 Essential (primary) hypertension: Secondary | ICD-10-CM | POA: Diagnosis not present

## 2012-09-07 DIAGNOSIS — R002 Palpitations: Secondary | ICD-10-CM | POA: Diagnosis not present

## 2012-09-07 DIAGNOSIS — R110 Nausea: Secondary | ICD-10-CM | POA: Diagnosis not present

## 2012-09-07 DIAGNOSIS — Z79899 Other long term (current) drug therapy: Secondary | ICD-10-CM | POA: Diagnosis not present

## 2012-09-07 DIAGNOSIS — R000 Tachycardia, unspecified: Secondary | ICD-10-CM | POA: Diagnosis not present

## 2012-09-07 DIAGNOSIS — Z7901 Long term (current) use of anticoagulants: Secondary | ICD-10-CM | POA: Diagnosis not present

## 2012-09-07 DIAGNOSIS — J449 Chronic obstructive pulmonary disease, unspecified: Secondary | ICD-10-CM | POA: Diagnosis not present

## 2012-09-10 DIAGNOSIS — M5137 Other intervertebral disc degeneration, lumbosacral region: Secondary | ICD-10-CM | POA: Diagnosis not present

## 2012-09-10 DIAGNOSIS — M546 Pain in thoracic spine: Secondary | ICD-10-CM | POA: Diagnosis not present

## 2012-09-10 DIAGNOSIS — M545 Low back pain: Secondary | ICD-10-CM | POA: Diagnosis not present

## 2012-09-10 DIAGNOSIS — M999 Biomechanical lesion, unspecified: Secondary | ICD-10-CM | POA: Diagnosis not present

## 2012-09-17 ENCOUNTER — Ambulatory Visit (INDEPENDENT_AMBULATORY_CARE_PROVIDER_SITE_OTHER): Payer: Medicaid Other | Admitting: Physician Assistant

## 2012-09-17 ENCOUNTER — Encounter: Payer: Self-pay | Admitting: Physician Assistant

## 2012-09-17 VITALS — BP 136/79 | HR 60 | Ht 61.0 in | Wt 201.0 lb

## 2012-09-17 DIAGNOSIS — I2699 Other pulmonary embolism without acute cor pulmonale: Secondary | ICD-10-CM | POA: Diagnosis not present

## 2012-09-17 DIAGNOSIS — I5032 Chronic diastolic (congestive) heart failure: Secondary | ICD-10-CM | POA: Diagnosis not present

## 2012-09-17 DIAGNOSIS — I498 Other specified cardiac arrhythmias: Secondary | ICD-10-CM

## 2012-09-17 DIAGNOSIS — R002 Palpitations: Secondary | ICD-10-CM | POA: Diagnosis not present

## 2012-09-17 DIAGNOSIS — Z7901 Long term (current) use of anticoagulants: Secondary | ICD-10-CM | POA: Diagnosis not present

## 2012-09-17 DIAGNOSIS — I471 Supraventricular tachycardia: Secondary | ICD-10-CM

## 2012-09-17 MED ORDER — METOPROLOL TARTRATE 25 MG PO TABS: ORAL_TABLET | ORAL | Status: DC

## 2012-09-17 NOTE — Assessment & Plan Note (Signed)
We'll further evaluate with a 21 day event monitor to rule out recurrent SVT. Of note, she states today that her previous RF ablation procedure in 1998, at [facility], was unsuccessful. Will also check TSH level. Will provide prescription for Lopressor 25 mg, to be used as needed for recurrent sustained palpitations. Further recommendations to follow, pending review of monitor results. Patient otherwise to follow up with Dr. Shirlee Latch, as previously scheduled.

## 2012-09-17 NOTE — Assessment & Plan Note (Signed)
Euvolemic by history exam, with recent BNP level less than 100.

## 2012-09-17 NOTE — Patient Instructions (Addendum)
   21 day e-cardio heart monitor  Lab - TSH  Office will contact with results  Lopressor 25mg  - may take one tab as needed for palpitations Follow up as scheduled

## 2012-09-17 NOTE — Progress Notes (Addendum)
Primary Cardiologist: Marca Ancona, MD   HPI:  Post hospital followup from [facility] ED, status post presentation with palpitations. She presented with a pulse of 73 and was in NSR by EKG. Electrolytes and cardiac markers normal. TSH was not drawn.  She reports today that she experienced a three-hour episode of tachycardia palpitations, prior to arrival to the ED. As noted, she was in NSR on arrival. She then had a recurrent episode a few days later, this time lasting only a few minutes in duration. She reports some associated weakness, but no near syncope/syncope, dyspnea, or chest pain. She is compliant with her medications and does not use caffeinated beverages.   12 lead EKG today, reviewed by me, reveals NSR 60 bpm with normal intervals  Allergies  Allergen Reactions  . Adhesive (Tape)   . Codeine     REACTION: vomiting    Current Outpatient Prescriptions  Medication Sig Dispense Refill  . albuterol (PROAIR HFA) 108 (90 BASE) MCG/ACT inhaler Inhale 2 puffs into the lungs 4 (four) times daily.       . benzonatate (TESSALON) 100 MG capsule Take 100 mg by mouth 3 (three) times daily as needed.      . budesonide-formoterol (SYMBICORT) 160-4.5 MCG/ACT inhaler Inhale 2 puffs into the lungs 2 (two) times daily.      . Calcium Carbonate-Vitamin D (CALTRATE 600+D) 600-400 MG-UNIT per tablet Take 1 tablet by mouth 2 (two) times daily.      . clonazePAM (KLONOPIN) 0.5 MG tablet Take 0.5 tablets (0.25 mg total) by mouth every morning.  30 tablet  1  . colchicine 0.6 MG tablet Take 0.6 mg by mouth 2 (two) times daily.      . cycloSPORINE (RESTASIS) 0.05 % ophthalmic emulsion Place 1 drop into both eyes 2 (two) times daily.      . fluticasone (FLONASE) 50 MCG/ACT nasal spray Place 1 spray into the nose daily.       Marland Kitchen LASIX 40 MG tablet TAKE (1) TABLET BY MOUTH ONCE DAILY.  30 each  6  . loratadine (CLARITIN) 10 MG tablet Take 10 mg by mouth as needed.       . metoprolol succinate (TOPROL XL) 100 MG 24  hr tablet Take 1 tablet (100 mg total) by mouth daily.  30 tablet  6  . nystatin (MYCOSTATIN) powder Apply topically as needed.      Marland Kitchen PARoxetine (PAXIL) 30 MG tablet Take 30 mg by mouth daily.      . potassium chloride SA (K-DUR,KLOR-CON) 20 MEQ tablet Take 20 mEq by mouth daily.      . ranitidine (ZANTAC) 150 MG tablet Take 150 mg by mouth daily.      . Simethicone (GAS-X EXTRA STRENGTH) 125 MG CAPS Take 2 capsules by mouth as needed.      . tiotropium (SPIRIVA) 18 MCG inhalation capsule Place 18 mcg into inhaler and inhale daily.      . traMADol (ULTRAM) 50 MG tablet Take 50-100 mg by mouth 2 (two) times daily.      . traZODone (DESYREL) 50 MG tablet Take 1 tablet (50 mg total) by mouth at bedtime.  30 tablet  3  . triamcinolone cream (KENALOG) 0.1 % Apply 1 application topically as needed.       . warfarin (COUMADIN) 5 MG tablet Take 5 mg by mouth daily. Managed by [facility] office         Past Medical History  Diagnosis Date  . SVT (supraventricular tachycardia)   .  Chest pain, unspecified   . Congestive heart failure, unspecified   . Depressive disorder, not elsewhere classified   . 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 Unspecified essential hypertension   . Cardiac dysrhythmia, unspecified   . Palpitations   . Chronic airway obstruction, not elsewhere classified   . Obesity, unspecified   . History of rheumatoid arthritis   . Carotid bruit     Bilateral  . Syncope     admitted 09/2011    Past Surgical History  Procedure Date  . Carpal tunnel release   . Catheter ablation 02/1997    @ [facility]  . Vein surgery bladder     History   Social History  . Marital Status: Single    Spouse Name: N/A    Number of Children: N/A  . Years of Education: N/A   Occupational History  . Not on file.   Social History Main Topics  . Smoking status: Never Smoker   . Smokeless tobacco: Never Used  . Alcohol Use: Not on file  . Drug Use: Not on file  .  Sexually Active: Not on file   Other Topics Concern  . Not on file   Social History Narrative  . No narrative on file    Family History  Problem Relation Age of Onset  . Cancer Mother   . Heart disease Mother   . Stroke      ROS: no nausea, vomiting; no fever, chills; no melena, hematochezia; no claudication  PHYSICAL EXAM: BP 136/79  Pulse 60  Ht 5\' 1"  (1.549 m)  Wt 201 lb (91.173 kg)  BMI 37.98 kg/m2 GENERAL: 62 year old female, obese; NAD HEENT: NCAT, PERRLA, EOMI; sclera clear; no xanthelasma NECK: palpable bilateral carotid pulses, no bruits; no JVD; no TM LUNGS: CTA bilaterally CARDIAC: RRR (S1, S2); no significant murmurs; no rubs or gallops ABDOMEN: Protuberant EXTREMETIES: intact distal pulses; no significant peripheral edema SKIN: warm/dry; no obvious rash/lesions MUSCULOSKELETAL: no joint deformity NEURO: no focal deficit; NL affect   EKG: reviewed and available in Electronic Records   ASSESSMENT & PLAN:  PALPITATIONS Will further evaluate with a 21 day event monitor to rule out recurrent SVT. Of note, she states today that her previous RF ablation procedure in 1998, at [facility], was unsuccessful. Will also check TSH level. Will provide prescription for Lopressor 25 mg, to be used as needed for recurrent sustained palpitations. Further recommendations to follow, pending review of monitor results. Patient otherwise to follow up with Dr. Shirlee Latch, as previously scheduled.  Chronic diastolic heart failure Euvolemic by history exam, with recent BNP level less than 100.  Pulmonary embolism On chronic Coumadin anticoagulation, followed by primary M.D.    Gene Special Ranes, PAC

## 2012-09-17 NOTE — Assessment & Plan Note (Signed)
On chronic Coumadin anticoagulation, followed by primary M.D.

## 2012-09-19 ENCOUNTER — Encounter: Payer: Self-pay | Admitting: *Deleted

## 2012-09-19 DIAGNOSIS — I10 Essential (primary) hypertension: Secondary | ICD-10-CM | POA: Diagnosis not present

## 2012-09-23 DIAGNOSIS — M25519 Pain in unspecified shoulder: Secondary | ICD-10-CM | POA: Diagnosis not present

## 2012-09-23 DIAGNOSIS — M545 Low back pain, unspecified: Secondary | ICD-10-CM | POA: Diagnosis not present

## 2012-09-23 DIAGNOSIS — S0993XA Unspecified injury of face, initial encounter: Secondary | ICD-10-CM | POA: Diagnosis not present

## 2012-09-23 DIAGNOSIS — M79609 Pain in unspecified limb: Secondary | ICD-10-CM | POA: Diagnosis not present

## 2012-09-23 DIAGNOSIS — R52 Pain, unspecified: Secondary | ICD-10-CM | POA: Diagnosis not present

## 2012-09-23 DIAGNOSIS — J449 Chronic obstructive pulmonary disease, unspecified: Secondary | ICD-10-CM | POA: Diagnosis not present

## 2012-09-23 DIAGNOSIS — IMO0002 Reserved for concepts with insufficient information to code with codable children: Secondary | ICD-10-CM | POA: Diagnosis not present

## 2012-09-23 DIAGNOSIS — M25569 Pain in unspecified knee: Secondary | ICD-10-CM | POA: Diagnosis not present

## 2012-09-23 DIAGNOSIS — Z79899 Other long term (current) drug therapy: Secondary | ICD-10-CM | POA: Diagnosis not present

## 2012-09-23 DIAGNOSIS — Z7901 Long term (current) use of anticoagulants: Secondary | ICD-10-CM | POA: Diagnosis not present

## 2012-09-23 DIAGNOSIS — I10 Essential (primary) hypertension: Secondary | ICD-10-CM | POA: Diagnosis not present

## 2012-09-23 DIAGNOSIS — S8990XA Unspecified injury of unspecified lower leg, initial encounter: Secondary | ICD-10-CM | POA: Diagnosis not present

## 2012-09-23 DIAGNOSIS — Z86711 Personal history of pulmonary embolism: Secondary | ICD-10-CM | POA: Diagnosis not present

## 2012-09-23 DIAGNOSIS — M542 Cervicalgia: Secondary | ICD-10-CM | POA: Diagnosis not present

## 2012-09-23 DIAGNOSIS — M25539 Pain in unspecified wrist: Secondary | ICD-10-CM | POA: Diagnosis not present

## 2012-09-23 DIAGNOSIS — S4980XA Other specified injuries of shoulder and upper arm, unspecified arm, initial encounter: Secondary | ICD-10-CM | POA: Diagnosis not present

## 2012-10-02 DIAGNOSIS — I471 Supraventricular tachycardia: Secondary | ICD-10-CM | POA: Diagnosis not present

## 2012-10-02 DIAGNOSIS — R002 Palpitations: Secondary | ICD-10-CM

## 2012-10-03 DIAGNOSIS — S46819A Strain of other muscles, fascia and tendons at shoulder and upper arm level, unspecified arm, initial encounter: Secondary | ICD-10-CM | POA: Diagnosis not present

## 2012-10-03 DIAGNOSIS — Z7901 Long term (current) use of anticoagulants: Secondary | ICD-10-CM | POA: Diagnosis not present

## 2012-10-03 DIAGNOSIS — S43499A Other sprain of unspecified shoulder joint, initial encounter: Secondary | ICD-10-CM | POA: Diagnosis not present

## 2012-10-03 DIAGNOSIS — M549 Dorsalgia, unspecified: Secondary | ICD-10-CM | POA: Diagnosis not present

## 2012-10-08 DIAGNOSIS — M5137 Other intervertebral disc degeneration, lumbosacral region: Secondary | ICD-10-CM | POA: Diagnosis not present

## 2012-10-08 DIAGNOSIS — M999 Biomechanical lesion, unspecified: Secondary | ICD-10-CM | POA: Diagnosis not present

## 2012-10-08 DIAGNOSIS — M546 Pain in thoracic spine: Secondary | ICD-10-CM | POA: Diagnosis not present

## 2012-10-20 DIAGNOSIS — M5137 Other intervertebral disc degeneration, lumbosacral region: Secondary | ICD-10-CM | POA: Diagnosis not present

## 2012-10-20 DIAGNOSIS — M546 Pain in thoracic spine: Secondary | ICD-10-CM | POA: Diagnosis not present

## 2012-10-20 DIAGNOSIS — M999 Biomechanical lesion, unspecified: Secondary | ICD-10-CM | POA: Diagnosis not present

## 2012-10-22 DIAGNOSIS — Z7901 Long term (current) use of anticoagulants: Secondary | ICD-10-CM | POA: Diagnosis not present

## 2012-10-27 DIAGNOSIS — M546 Pain in thoracic spine: Secondary | ICD-10-CM | POA: Diagnosis not present

## 2012-10-27 DIAGNOSIS — M999 Biomechanical lesion, unspecified: Secondary | ICD-10-CM | POA: Diagnosis not present

## 2012-10-27 DIAGNOSIS — M5137 Other intervertebral disc degeneration, lumbosacral region: Secondary | ICD-10-CM | POA: Diagnosis not present

## 2012-11-05 DIAGNOSIS — M999 Biomechanical lesion, unspecified: Secondary | ICD-10-CM | POA: Diagnosis not present

## 2012-11-05 DIAGNOSIS — M546 Pain in thoracic spine: Secondary | ICD-10-CM | POA: Diagnosis not present

## 2012-11-05 DIAGNOSIS — M5137 Other intervertebral disc degeneration, lumbosacral region: Secondary | ICD-10-CM | POA: Diagnosis not present

## 2012-11-05 DIAGNOSIS — Z7901 Long term (current) use of anticoagulants: Secondary | ICD-10-CM | POA: Diagnosis not present

## 2012-11-11 DIAGNOSIS — M546 Pain in thoracic spine: Secondary | ICD-10-CM | POA: Diagnosis not present

## 2012-11-11 DIAGNOSIS — M542 Cervicalgia: Secondary | ICD-10-CM | POA: Diagnosis not present

## 2012-11-11 DIAGNOSIS — M999 Biomechanical lesion, unspecified: Secondary | ICD-10-CM | POA: Diagnosis not present

## 2012-11-19 DIAGNOSIS — M546 Pain in thoracic spine: Secondary | ICD-10-CM | POA: Diagnosis not present

## 2012-11-19 DIAGNOSIS — M999 Biomechanical lesion, unspecified: Secondary | ICD-10-CM | POA: Diagnosis not present

## 2012-11-19 DIAGNOSIS — M542 Cervicalgia: Secondary | ICD-10-CM | POA: Diagnosis not present

## 2012-11-26 DIAGNOSIS — M542 Cervicalgia: Secondary | ICD-10-CM | POA: Diagnosis not present

## 2012-11-26 DIAGNOSIS — M999 Biomechanical lesion, unspecified: Secondary | ICD-10-CM | POA: Diagnosis not present

## 2012-11-26 DIAGNOSIS — Z7901 Long term (current) use of anticoagulants: Secondary | ICD-10-CM | POA: Diagnosis not present

## 2012-11-26 DIAGNOSIS — M546 Pain in thoracic spine: Secondary | ICD-10-CM | POA: Diagnosis not present

## 2012-12-02 DIAGNOSIS — Z79899 Other long term (current) drug therapy: Secondary | ICD-10-CM | POA: Diagnosis not present

## 2012-12-03 DIAGNOSIS — M5137 Other intervertebral disc degeneration, lumbosacral region: Secondary | ICD-10-CM | POA: Diagnosis not present

## 2012-12-03 DIAGNOSIS — M999 Biomechanical lesion, unspecified: Secondary | ICD-10-CM | POA: Diagnosis not present

## 2012-12-03 DIAGNOSIS — M546 Pain in thoracic spine: Secondary | ICD-10-CM | POA: Diagnosis not present

## 2012-12-08 DIAGNOSIS — M19049 Primary osteoarthritis, unspecified hand: Secondary | ICD-10-CM | POA: Diagnosis not present

## 2012-12-08 DIAGNOSIS — M1120 Other chondrocalcinosis, unspecified site: Secondary | ICD-10-CM | POA: Diagnosis not present

## 2012-12-08 DIAGNOSIS — M545 Low back pain: Secondary | ICD-10-CM | POA: Diagnosis not present

## 2012-12-08 DIAGNOSIS — M1710 Unilateral primary osteoarthritis, unspecified knee: Secondary | ICD-10-CM | POA: Diagnosis not present

## 2012-12-10 DIAGNOSIS — Z7901 Long term (current) use of anticoagulants: Secondary | ICD-10-CM | POA: Diagnosis not present

## 2012-12-17 DIAGNOSIS — J019 Acute sinusitis, unspecified: Secondary | ICD-10-CM | POA: Diagnosis not present

## 2012-12-24 DIAGNOSIS — Z7901 Long term (current) use of anticoagulants: Secondary | ICD-10-CM | POA: Diagnosis not present

## 2012-12-31 DIAGNOSIS — M5137 Other intervertebral disc degeneration, lumbosacral region: Secondary | ICD-10-CM | POA: Diagnosis not present

## 2012-12-31 DIAGNOSIS — M999 Biomechanical lesion, unspecified: Secondary | ICD-10-CM | POA: Diagnosis not present

## 2012-12-31 DIAGNOSIS — M546 Pain in thoracic spine: Secondary | ICD-10-CM | POA: Diagnosis not present

## 2013-01-13 DIAGNOSIS — M79609 Pain in unspecified limb: Secondary | ICD-10-CM | POA: Diagnosis not present

## 2013-01-13 DIAGNOSIS — M2040 Other hammer toe(s) (acquired), unspecified foot: Secondary | ICD-10-CM | POA: Diagnosis not present

## 2013-01-14 DIAGNOSIS — Z7901 Long term (current) use of anticoagulants: Secondary | ICD-10-CM | POA: Diagnosis not present

## 2013-01-28 DIAGNOSIS — Z7901 Long term (current) use of anticoagulants: Secondary | ICD-10-CM | POA: Diagnosis not present

## 2013-01-28 DIAGNOSIS — M5137 Other intervertebral disc degeneration, lumbosacral region: Secondary | ICD-10-CM | POA: Diagnosis not present

## 2013-01-28 DIAGNOSIS — M546 Pain in thoracic spine: Secondary | ICD-10-CM | POA: Diagnosis not present

## 2013-01-28 DIAGNOSIS — M999 Biomechanical lesion, unspecified: Secondary | ICD-10-CM | POA: Diagnosis not present

## 2013-01-30 DIAGNOSIS — J441 Chronic obstructive pulmonary disease with (acute) exacerbation: Secondary | ICD-10-CM | POA: Diagnosis not present

## 2013-01-30 DIAGNOSIS — J328 Other chronic sinusitis: Secondary | ICD-10-CM | POA: Diagnosis not present

## 2013-02-11 DIAGNOSIS — Z7901 Long term (current) use of anticoagulants: Secondary | ICD-10-CM | POA: Diagnosis not present

## 2013-02-16 DIAGNOSIS — K2960 Other gastritis without bleeding: Secondary | ICD-10-CM | POA: Diagnosis not present

## 2013-02-17 ENCOUNTER — Encounter: Payer: Self-pay | Admitting: Cardiovascular Disease

## 2013-02-17 DIAGNOSIS — J449 Chronic obstructive pulmonary disease, unspecified: Secondary | ICD-10-CM | POA: Diagnosis not present

## 2013-02-17 DIAGNOSIS — J45909 Unspecified asthma, uncomplicated: Secondary | ICD-10-CM | POA: Diagnosis not present

## 2013-02-17 DIAGNOSIS — R059 Cough, unspecified: Secondary | ICD-10-CM | POA: Diagnosis not present

## 2013-02-17 LAB — PULMONARY FUNCTION TEST

## 2013-02-25 DIAGNOSIS — M546 Pain in thoracic spine: Secondary | ICD-10-CM | POA: Diagnosis not present

## 2013-02-25 DIAGNOSIS — Z79899 Other long term (current) drug therapy: Secondary | ICD-10-CM | POA: Diagnosis not present

## 2013-02-25 DIAGNOSIS — M999 Biomechanical lesion, unspecified: Secondary | ICD-10-CM | POA: Diagnosis not present

## 2013-02-25 DIAGNOSIS — Z7901 Long term (current) use of anticoagulants: Secondary | ICD-10-CM | POA: Diagnosis not present

## 2013-02-25 DIAGNOSIS — M5137 Other intervertebral disc degeneration, lumbosacral region: Secondary | ICD-10-CM | POA: Diagnosis not present

## 2013-03-11 DIAGNOSIS — Z7901 Long term (current) use of anticoagulants: Secondary | ICD-10-CM | POA: Diagnosis not present

## 2013-03-11 DIAGNOSIS — M999 Biomechanical lesion, unspecified: Secondary | ICD-10-CM | POA: Diagnosis not present

## 2013-03-11 DIAGNOSIS — M5137 Other intervertebral disc degeneration, lumbosacral region: Secondary | ICD-10-CM | POA: Diagnosis not present

## 2013-03-11 DIAGNOSIS — M546 Pain in thoracic spine: Secondary | ICD-10-CM | POA: Diagnosis not present

## 2013-03-18 DIAGNOSIS — M999 Biomechanical lesion, unspecified: Secondary | ICD-10-CM | POA: Diagnosis not present

## 2013-03-18 DIAGNOSIS — M546 Pain in thoracic spine: Secondary | ICD-10-CM | POA: Diagnosis not present

## 2013-03-18 DIAGNOSIS — M5137 Other intervertebral disc degeneration, lumbosacral region: Secondary | ICD-10-CM | POA: Diagnosis not present

## 2013-03-25 DIAGNOSIS — Z7901 Long term (current) use of anticoagulants: Secondary | ICD-10-CM | POA: Diagnosis not present

## 2013-03-30 DIAGNOSIS — M999 Biomechanical lesion, unspecified: Secondary | ICD-10-CM | POA: Diagnosis not present

## 2013-03-30 DIAGNOSIS — M546 Pain in thoracic spine: Secondary | ICD-10-CM | POA: Diagnosis not present

## 2013-03-30 DIAGNOSIS — M5137 Other intervertebral disc degeneration, lumbosacral region: Secondary | ICD-10-CM | POA: Diagnosis not present

## 2013-04-05 ENCOUNTER — Encounter: Payer: Self-pay | Admitting: Cardiovascular Disease

## 2013-04-05 DIAGNOSIS — K2900 Acute gastritis without bleeding: Secondary | ICD-10-CM | POA: Diagnosis not present

## 2013-04-05 DIAGNOSIS — R110 Nausea: Secondary | ICD-10-CM | POA: Diagnosis not present

## 2013-04-05 DIAGNOSIS — R112 Nausea with vomiting, unspecified: Secondary | ICD-10-CM | POA: Diagnosis not present

## 2013-04-05 DIAGNOSIS — R791 Abnormal coagulation profile: Secondary | ICD-10-CM | POA: Diagnosis not present

## 2013-04-05 DIAGNOSIS — Z8249 Family history of ischemic heart disease and other diseases of the circulatory system: Secondary | ICD-10-CM | POA: Diagnosis not present

## 2013-04-05 DIAGNOSIS — J45909 Unspecified asthma, uncomplicated: Secondary | ICD-10-CM | POA: Diagnosis not present

## 2013-04-05 DIAGNOSIS — Z86711 Personal history of pulmonary embolism: Secondary | ICD-10-CM | POA: Diagnosis not present

## 2013-04-05 DIAGNOSIS — Z7901 Long term (current) use of anticoagulants: Secondary | ICD-10-CM | POA: Diagnosis not present

## 2013-04-05 DIAGNOSIS — K2970 Gastritis, unspecified, without bleeding: Secondary | ICD-10-CM | POA: Diagnosis not present

## 2013-04-05 DIAGNOSIS — IMO0002 Reserved for concepts with insufficient information to code with codable children: Secondary | ICD-10-CM | POA: Diagnosis not present

## 2013-04-05 DIAGNOSIS — Z79899 Other long term (current) drug therapy: Secondary | ICD-10-CM | POA: Diagnosis not present

## 2013-04-05 DIAGNOSIS — I10 Essential (primary) hypertension: Secondary | ICD-10-CM | POA: Diagnosis not present

## 2013-04-06 DIAGNOSIS — M999 Biomechanical lesion, unspecified: Secondary | ICD-10-CM | POA: Diagnosis not present

## 2013-04-06 DIAGNOSIS — M5137 Other intervertebral disc degeneration, lumbosacral region: Secondary | ICD-10-CM | POA: Diagnosis not present

## 2013-04-06 DIAGNOSIS — M546 Pain in thoracic spine: Secondary | ICD-10-CM | POA: Diagnosis not present

## 2013-04-13 DIAGNOSIS — Z7901 Long term (current) use of anticoagulants: Secondary | ICD-10-CM | POA: Diagnosis not present

## 2013-04-20 DIAGNOSIS — Z7901 Long term (current) use of anticoagulants: Secondary | ICD-10-CM | POA: Diagnosis not present

## 2013-04-21 ENCOUNTER — Ambulatory Visit (INDEPENDENT_AMBULATORY_CARE_PROVIDER_SITE_OTHER): Payer: Medicare Other | Admitting: Cardiovascular Disease

## 2013-04-21 ENCOUNTER — Encounter: Payer: Self-pay | Admitting: Cardiovascular Disease

## 2013-04-21 VITALS — BP 111/72 | HR 103 | Ht 61.0 in | Wt 195.0 lb

## 2013-04-21 DIAGNOSIS — R000 Tachycardia, unspecified: Secondary | ICD-10-CM

## 2013-04-21 DIAGNOSIS — I5032 Chronic diastolic (congestive) heart failure: Secondary | ICD-10-CM | POA: Diagnosis not present

## 2013-04-21 DIAGNOSIS — R002 Palpitations: Secondary | ICD-10-CM | POA: Diagnosis not present

## 2013-04-21 MED ORDER — DILTIAZEM HCL 60 MG PO TABS: 60.0000 mg | ORAL_TABLET | Freq: Two times a day (BID) | ORAL | Status: DC

## 2013-04-21 NOTE — Progress Notes (Signed)
Patient ID: Dorothy Ford, female   DOB: October 03, 1950, 62 y.o.   MRN: [id number]    SUBJECTIVE: Dorothy Ford has a h/o PE (on chronic Warfarin therapy), SVT, palpitations, and chronic diastolic heart failure, along with HTN. A stress test in January 2013 showed "probably normal LV perfusion" with apical thinning, and an EF of 74%. An echocardiogram from January 2013 showed normal LV systolic function (EF 55-60%), diastolic dysfunction, and a possible PFO with left to right shunting. A 21-day event monitor from February 2014 showed normal sinus rhythm, and she had a normal TSH in January 2014.  She's felt anxious and had palpitations this morning, and did take her anxiety medication. She said she's had bouts with illnesses this past year. She's felt nauseous with these palpitations. She currently takes Metoprolol 100 mg daily and an extra 25 mg when her palpitations aren't controlled.  She reportedly had an unsuccessful ablation in 1998 at [facility] She denies chest pain and syncope.  She denies fevers and a h/o anemia. A recent CBC and BMP were unremarkable (no leukocytosis, no anemia, normal potassium).   Past Medical History   Diagnosis  Date   .  SVT (supraventricular tachycardia)    .  Chest pain, unspecified    .  Congestive heart failure, unspecified    .  Depressive disorder, not elsewhere classified    .  Diaphragmatic hernia without mention of obstruction or gangrene    .  Unspecified essential hypertension    .  Cardiac dysrhythmia, unspecified    .  Palpitations    .  Chronic airway obstruction, not elsewhere classified    .  Obesity, unspecified    .  History of rheumatoid arthritis    .  Carotid bruit      Bilateral   .  Syncope      admitted 09/2011    Past Surgical History   Procedure  Date   .  Carpal tunnel release    .  Catheter ablation  02/1997     @ [facility]   .  Vein surgery bladder        Filed Vitals:   04/21/13 1254  Height: 5\' 1"   (1.549 m)  Weight: 195 lb (88.451 kg)   BP: 111/72  Pulse: 103   PHYSICAL EXAM General: NAD, appears anxious Neck: No JVD, no thyromegaly or thyroid nodule.  Lungs: Clear to auscultation bilaterally with normal respiratory effort. CV: Nondisplaced PMI.  Heart regular, slightly tachycardic, normal S1/S2, no S3/S4, soft I/VI holosystolic murmur.  No peripheral edema.  No carotid bruit.  Normal pedal pulses.  Abdomen: Soft, nontender, no hepatosplenomegaly, no distention.  Neurologic: Alert and oriented x 3.  Psych: Normal affect. Extremities: No clubbing or cyanosis.     LABS: Basic Metabolic Panel: No results found for this basename: NA, K, CL, CO2, GLUCOSE, BUN, CREATININE, CALCIUM, MG, PHOS,  in the last 72 hours Liver Function Tests: No results found for this basename: AST, ALT, ALKPHOS, BILITOT, PROT, ALBUMIN,  in the last 72 hours No results found for this basename: LIPASE, AMYLASE,  in the last 72 hours CBC: No results found for this basename: WBC, NEUTROABS, HGB, HCT, MCV, PLT,  in the last 72 hours Cardiac Enzymes: No results found for this basename: CKTOTAL, CKMB, CKMBINDEX, TROPONINI,  in the last 72 hours BNP: No components found with this basename: POCBNP,  D-Dimer: No results found for this basename: DDIMER,  in the last 72 hours Hemoglobin A1C: No results  found for this basename: HGBA1C,  in the last 72 hours Fasting Lipid Panel: No results found for this basename: CHOL, HDL, LDLCALC, TRIG, CHOLHDL, LDLDIRECT,  in the last 72 hours Thyroid Function Tests: No results found for this basename: TSH, T4TOTAL, FREET3, T3FREE, THYROIDAB,  in the last 72 hours Anemia Panel: No results found for this basename: VITAMINB12, FOLATE, FERRITIN, TIBC, IRON, RETICCTPCT,  in the last 72 hours  RADIOLOGY: No results found.    ASSESSMENT AND PLAN:  Palpitations/Sinus Tachycardia She apparently had a previous RF ablation procedure in 1998, at [facility], which was  unsuccessful. Will add Diltiazem 60 mg bid to avoid the development of a tachycardia-mediated cardiomyopathy. Again, she has a normal white count and Hgb, ruling out an infectious etiology and anemia, respectively.  I've asked her to return to the clinic in a week for a BP check, and I've informed her of the potential side effects of dizziness and hypotension. I will have her f/u with me in 1 month.  Chronic diastolic heart failure  Euvolemic by history and exam.  Pulmonary embolism  On chronic Coumadin anticoagulation, followed by primary M.D.       Prentice Docker, M.D., F.A.C.C.

## 2013-04-21 NOTE — Patient Instructions (Addendum)
Your physician recommends that you schedule a follow-up appointment in: 4-6 weeks with Dr. Anders Simmonds. You will schedule this appointment today.  Your physician recommends that you come back in 1 week for a nurse visit to get your blood pressure checked.  Your physician has recommended you make the following change in your medication:  Start: Diltiazem 60 MG twice daily. A prescription for this has been sent into your pharmacy.

## 2013-04-22 DIAGNOSIS — M546 Pain in thoracic spine: Secondary | ICD-10-CM | POA: Diagnosis not present

## 2013-04-22 DIAGNOSIS — M5137 Other intervertebral disc degeneration, lumbosacral region: Secondary | ICD-10-CM | POA: Diagnosis not present

## 2013-04-22 DIAGNOSIS — M999 Biomechanical lesion, unspecified: Secondary | ICD-10-CM | POA: Diagnosis not present

## 2013-04-28 ENCOUNTER — Ambulatory Visit (INDEPENDENT_AMBULATORY_CARE_PROVIDER_SITE_OTHER): Payer: Medicare Other | Admitting: *Deleted

## 2013-04-28 VITALS — BP 126/82 | HR 54 | Ht 61.0 in | Wt 197.0 lb

## 2013-04-28 DIAGNOSIS — R000 Tachycardia, unspecified: Secondary | ICD-10-CM

## 2013-04-28 DIAGNOSIS — I10 Essential (primary) hypertension: Secondary | ICD-10-CM

## 2013-04-28 NOTE — Progress Notes (Signed)
Patient came into office for a blood pressure check. Reviewed patient's medicine, allergies, and recorded vitals. Patient had no complaints of dizziness, chest pain, or shortness of breath. Patient has taken all medications with no side affects.

## 2013-04-29 DIAGNOSIS — Z7901 Long term (current) use of anticoagulants: Secondary | ICD-10-CM | POA: Diagnosis not present

## 2013-05-11 DIAGNOSIS — M1710 Unilateral primary osteoarthritis, unspecified knee: Secondary | ICD-10-CM | POA: Diagnosis not present

## 2013-05-11 DIAGNOSIS — M1129 Other chondrocalcinosis, multiple sites: Secondary | ICD-10-CM | POA: Diagnosis not present

## 2013-05-11 DIAGNOSIS — M545 Low back pain: Secondary | ICD-10-CM | POA: Diagnosis not present

## 2013-05-11 DIAGNOSIS — M19049 Primary osteoarthritis, unspecified hand: Secondary | ICD-10-CM | POA: Diagnosis not present

## 2013-05-13 DIAGNOSIS — Z7901 Long term (current) use of anticoagulants: Secondary | ICD-10-CM | POA: Diagnosis not present

## 2013-05-14 ENCOUNTER — Encounter: Payer: Self-pay | Admitting: Cardiovascular Disease

## 2013-05-14 ENCOUNTER — Ambulatory Visit (INDEPENDENT_AMBULATORY_CARE_PROVIDER_SITE_OTHER): Payer: Medicare Other | Admitting: Cardiovascular Disease

## 2013-05-14 VITALS — BP 122/75 | HR 54 | Ht 61.0 in | Wt 196.0 lb

## 2013-05-14 DIAGNOSIS — I5032 Chronic diastolic (congestive) heart failure: Secondary | ICD-10-CM

## 2013-05-14 DIAGNOSIS — R000 Tachycardia, unspecified: Secondary | ICD-10-CM | POA: Diagnosis not present

## 2013-05-14 DIAGNOSIS — I10 Essential (primary) hypertension: Secondary | ICD-10-CM | POA: Diagnosis not present

## 2013-05-14 DIAGNOSIS — R002 Palpitations: Secondary | ICD-10-CM

## 2013-05-14 DIAGNOSIS — I2699 Other pulmonary embolism without acute cor pulmonale: Secondary | ICD-10-CM

## 2013-05-14 MED ORDER — DILTIAZEM HCL 30 MG PO TABS: 30.0000 mg | ORAL_TABLET | Freq: Two times a day (BID) | ORAL | Status: DC

## 2013-05-14 NOTE — Progress Notes (Signed)
Patient ID: Dorothy Ford, female   DOB: [birth year], 62 y.o.   MRN: [id number]   SUBJECTIVE: Dorothy Ford is here for f/u of her palpitations and sinus tachycardia. Last month, she experienced one episode of weakness and fatigue, with her legs feeling tired and weak in particular. This occurred again this month. Her palpitations have subsided.  She denies chest pain and only gets short of breath if she's climbing stairs. She denies lightheadedness and syncope.  Her palpitations have resolved.    Allergies  Allergen Reactions  . Adhesive [Tape]   . Codeine     REACTION: vomiting    Current Outpatient Prescriptions  Medication Sig Dispense Refill  . albuterol (PROAIR HFA) 108 (90 BASE) MCG/ACT inhaler Inhale 2 puffs into the lungs 4 (four) times daily.       . budesonide-formoterol (SYMBICORT) 160-4.5 MCG/ACT inhaler Inhale 2 puffs into the lungs 2 (two) times daily.      . Calcium Carbonate-Vitamin D (CALTRATE 600+D) 600-400 MG-UNIT per tablet Take 1 tablet by mouth 2 (two) times daily.      . cetirizine (ZYRTEC) 10 MG tablet Take 10 mg by mouth daily.      . clonazePAM (KLONOPIN) 0.5 MG tablet Take 0.5 mg by mouth every morning.      . colchicine 0.6 MG tablet Take 0.6 mg by mouth 2 (two) times daily.      . cycloSPORINE (RESTASIS) 0.05 % ophthalmic emulsion Place 1 drop into both eyes 2 (two) times daily.      Marland Kitchen diltiazem (CARDIZEM) 120 MG tablet Take 60 mg by mouth 2 (two) times daily.      . fluticasone (FLONASE) 50 MCG/ACT nasal spray Place 1 spray into the nose daily.       Marland Kitchen LASIX 40 MG tablet TAKE (1) TABLET BY MOUTH ONCE DAILY.  30 each  6  . loperamide (IMODIUM A-D) 2 MG tablet Take 2 mg by mouth 4 (four) times daily as needed for diarrhea or loose stools.      . metoprolol succinate (TOPROL XL) 100 MG 24 hr tablet Take 1 tablet (100 mg total) by mouth daily.  30 tablet  6  . metoprolol tartrate (LOPRESSOR) 25 MG tablet May take one tab as needed for palpitations, no  more than 2 per 24 hour period  30 tablet  1  . nystatin (MYCOSTATIN) powder Apply topically as needed.      . pantoprazole (PROTONIX) 40 MG tablet Take 40 mg by mouth daily.      Marland Kitchen PARoxetine (PAXIL) 30 MG tablet Take 30 mg by mouth daily.      . potassium chloride (K-DUR) 10 MEQ tablet Take 2 tablets by mouth daily.      . Simethicone (GAS-X EXTRA STRENGTH) 125 MG CAPS Take 2 capsules by mouth as needed.      . tiotropium (SPIRIVA) 18 MCG inhalation capsule Place 18 mcg into inhaler and inhale daily.      . traMADol (ULTRAM) 50 MG tablet Take 50-100 mg by mouth 2 (two) times daily.      . traZODone (DESYREL) 50 MG tablet Take 50 mg by mouth at bedtime.      . triamcinolone cream (KENALOG) 0.1 % Apply 1 application topically as needed.       . warfarin (COUMADIN) 5 MG tablet Take 5 mg by mouth daily. Managed by [facility] office        No current facility-administered medications for this visit.    Past Medical  History  Diagnosis Date  . SVT (supraventricular tachycardia)   . Chest pain, unspecified   . Congestive heart failure, unspecified   . Depressive disorder, not elsewhere classified   . 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 Unspecified essential hypertension   . Cardiac dysrhythmia, unspecified   . Palpitations   . Chronic airway obstruction, not elsewhere classified   . Obesity, unspecified   . History of rheumatoid arthritis   . Carotid bruit     Bilateral  . Syncope     admitted 09/2011    Past Surgical History  Procedure Laterality Date  . Carpal tunnel release    . Catheter ablation  02/1997    @ [facility]  . Vein surgery bladder      History   Social History  . Marital Status: Single    Spouse Name: N/A    Number of Children: N/A  . Years of Education: N/A   Occupational History  . Not on file.   Social History Main Topics  . Smoking status: Never Smoker   . Smokeless tobacco: Never Used  . Alcohol Use: Not on file  . Drug  Use: Not on file  . Sexual Activity: Not on file   Other Topics Concern  . Not on file   Social History Narrative  . No narrative on file   BP: 122/75 Pulse: 54  PHYSICAL EXAM General: NAD Neck: No JVD, no thyromegaly or thyroid nodule.  Lungs: Clear to auscultation bilaterally with normal respiratory effort. CV: Nondisplaced PMI.  Heart regular S1/S2, no S3/S4, no murmur.  No peripheral edema.  No carotid bruit.  Normal pedal pulses.  Abdomen: Soft, nontender, no hepatosplenomegaly, no distention.  Neurologic: Alert and oriented x 3.  Psych: Normal affect. Extremities: No clubbing or cyanosis.   ECG: reviewed and available in electronic records.      ASSESSMENT AND PLAN: Palpitations/Sinus Tachycardia  She apparently had a previous RF ablation procedure in 1998, at [facility], which was unsuccessful.  At the last visit, I added Diltiazem 60 mg bid to avoid the development of a tachycardia-mediated cardiomyopathy.  Again, she had a normal white count and Hgb, ruling out an infectious etiology and anemia, respectively.  Her BP was normal at her nurse visit in late August, but her HR was 54 bpm at that time. Due to her episodes of fatigue and weakness, which is likely related to her bradycardia, I will reduce her Diltiazem to 30 mg bid and continue her Toprol-XL at 100 mg daily for the time being. I will again have her return for a nurse visit to check her HR and BP in 2 weeks.  Chronic diastolic heart failure  Euvolemic by history and exam.   Pulmonary embolism  On chronic Coumadin anticoagulation, followed by primary M.D.     Prentice Docker, M.D., F.A.C.C.

## 2013-05-14 NOTE — Addendum Note (Signed)
Addended by: Lesle Chris on: 05/14/2013 02:55 PM   Modules accepted: Orders, Medications

## 2013-05-14 NOTE — Patient Instructions (Addendum)
   Decrease Diltiazem to 30mg  twice a day - new sent to pharm Continue all other medications.   Nurse visit in 2 weeks for blood pressure & heart rate check Your physician wants you to follow up in:  1 year.  You will receive a reminder letter in the mail one-two months in advance.  If you don't receive a letter, please call our office to schedule the follow up appointment

## 2013-05-19 DIAGNOSIS — Z0000 Encounter for general adult medical examination without abnormal findings: Secondary | ICD-10-CM | POA: Diagnosis not present

## 2013-05-19 DIAGNOSIS — I10 Essential (primary) hypertension: Secondary | ICD-10-CM | POA: Diagnosis not present

## 2013-05-20 ENCOUNTER — Telehealth: Payer: Self-pay | Admitting: Cardiovascular Disease

## 2013-05-20 DIAGNOSIS — M9981 Other biomechanical lesions of cervical region: Secondary | ICD-10-CM | POA: Diagnosis not present

## 2013-05-20 DIAGNOSIS — M5137 Other intervertebral disc degeneration, lumbosacral region: Secondary | ICD-10-CM | POA: Diagnosis not present

## 2013-05-20 DIAGNOSIS — M999 Biomechanical lesion, unspecified: Secondary | ICD-10-CM | POA: Diagnosis not present

## 2013-05-20 DIAGNOSIS — M542 Cervicalgia: Secondary | ICD-10-CM | POA: Diagnosis not present

## 2013-05-20 NOTE — Telephone Encounter (Signed)
Discussed below with patient.  States she feels okay now.  Does not have machine at home to check BP/HR.  Offered to see her for nurse visit earlier than her 2 week time as previously discussed at last OV.  Was scheduled for 05/28/2013.  She will see if sister can bring her in the morning at 9:00.  If not, will will try for Friday morning.  Advised her that if she feels fainty/passes out to call 911 and also inform brother that is there in house with her.  She vebalized understanding.

## 2013-05-20 NOTE — Telephone Encounter (Signed)
Went to see her chiropractor Today and they took her Bp and pulse. States that her pulse dropped to 49. She was Advised to call our office.

## 2013-05-21 ENCOUNTER — Encounter: Payer: Medicare Other | Admitting: *Deleted

## 2013-05-21 NOTE — Progress Notes (Signed)
Patient stated that she had blood pressure checked on 05-19-2013 in Dr. Camelia Eng office and it was 160/100. She also had it checked again on 9-17 at the chiropractic office Dr. Ladona Ridgel and BP was 117/85 and heart rate was 49. No other complaints of symptoms at this time. Per Vickie patient was walking out of office and became dizzy and almost fell. Patient sat down and then left office.

## 2013-05-25 ENCOUNTER — Telehealth: Payer: Self-pay | Admitting: Cardiology

## 2013-05-25 MED ORDER — METOPROLOL SUCCINATE ER 50 MG PO TB24: 75.0000 mg | ORAL_TABLET | Freq: Every day | ORAL | Status: DC

## 2013-05-25 NOTE — Telephone Encounter (Signed)
Pt informed prescription sent to pharmacy for Toprol XL  50 MG 1.5 tablets once daily. And nurse visit scheduled for 06-05-2013 at 9:15.       Reduce Toprol XL to 75 mg daily, and have HR/BP rechecked in 2 weeks. ----- Message ----- From: Burnice Logan, CMA Sent: 05/21/2013 9:21  AM To: Laqueta Linden, MD

## 2013-05-27 DIAGNOSIS — Z7901 Long term (current) use of anticoagulants: Secondary | ICD-10-CM | POA: Diagnosis not present

## 2013-06-05 ENCOUNTER — Ambulatory Visit (INDEPENDENT_AMBULATORY_CARE_PROVIDER_SITE_OTHER): Payer: Medicare Other | Admitting: *Deleted

## 2013-06-05 ENCOUNTER — Encounter: Payer: Self-pay | Admitting: *Deleted

## 2013-06-05 VITALS — BP 119/76 | HR 56 | Ht 61.0 in | Wt 195.0 lb

## 2013-06-05 DIAGNOSIS — R000 Tachycardia, unspecified: Secondary | ICD-10-CM | POA: Diagnosis not present

## 2013-06-05 DIAGNOSIS — I10 Essential (primary) hypertension: Secondary | ICD-10-CM | POA: Diagnosis not present

## 2013-06-05 NOTE — Progress Notes (Signed)
Patient presents to office today for nurse visit to have BP and HR checked that requested at last office visit. Patient states she has taken all medications as prescribed without missing any doses. No c/o side effects from  Medications. Patient denies having dizziness, chest pain or sob.

## 2013-06-10 DIAGNOSIS — Z7901 Long term (current) use of anticoagulants: Secondary | ICD-10-CM | POA: Diagnosis not present

## 2013-06-15 MED ORDER — METOPROLOL SUCCINATE ER 50 MG PO TB24: 50.0000 mg | ORAL_TABLET | Freq: Every day | ORAL | Status: DC

## 2013-06-15 NOTE — Progress Notes (Signed)
Per Dr. Kirtland Bouchard. Request, patient's toprol xl has been reduced to 50 mg daily. Patient informed and new prescription sent to pharmacy.

## 2013-06-17 DIAGNOSIS — M546 Pain in thoracic spine: Secondary | ICD-10-CM | POA: Diagnosis not present

## 2013-06-17 DIAGNOSIS — M5137 Other intervertebral disc degeneration, lumbosacral region: Secondary | ICD-10-CM | POA: Diagnosis not present

## 2013-06-17 DIAGNOSIS — M999 Biomechanical lesion, unspecified: Secondary | ICD-10-CM | POA: Diagnosis not present

## 2013-06-21 DIAGNOSIS — R296 Repeated falls: Secondary | ICD-10-CM | POA: Diagnosis not present

## 2013-06-21 DIAGNOSIS — R51 Headache: Secondary | ICD-10-CM | POA: Diagnosis not present

## 2013-06-21 DIAGNOSIS — S59909A Unspecified injury of unspecified elbow, initial encounter: Secondary | ICD-10-CM | POA: Diagnosis not present

## 2013-06-21 DIAGNOSIS — Z7901 Long term (current) use of anticoagulants: Secondary | ICD-10-CM | POA: Diagnosis not present

## 2013-06-21 DIAGNOSIS — IMO0002 Reserved for concepts with insufficient information to code with codable children: Secondary | ICD-10-CM | POA: Diagnosis not present

## 2013-06-21 DIAGNOSIS — M25539 Pain in unspecified wrist: Secondary | ICD-10-CM | POA: Diagnosis not present

## 2013-06-21 DIAGNOSIS — I10 Essential (primary) hypertension: Secondary | ICD-10-CM | POA: Diagnosis not present

## 2013-06-21 DIAGNOSIS — R791 Abnormal coagulation profile: Secondary | ICD-10-CM | POA: Diagnosis not present

## 2013-06-21 DIAGNOSIS — Z8249 Family history of ischemic heart disease and other diseases of the circulatory system: Secondary | ICD-10-CM | POA: Diagnosis not present

## 2013-06-21 DIAGNOSIS — J449 Chronic obstructive pulmonary disease, unspecified: Secondary | ICD-10-CM | POA: Diagnosis not present

## 2013-06-21 DIAGNOSIS — R42 Dizziness and giddiness: Secondary | ICD-10-CM | POA: Diagnosis not present

## 2013-06-21 DIAGNOSIS — M25559 Pain in unspecified hip: Secondary | ICD-10-CM | POA: Diagnosis not present

## 2013-06-21 DIAGNOSIS — S63509A Unspecified sprain of unspecified wrist, initial encounter: Secondary | ICD-10-CM | POA: Diagnosis not present

## 2013-06-21 DIAGNOSIS — S0990XA Unspecified injury of head, initial encounter: Secondary | ICD-10-CM | POA: Diagnosis not present

## 2013-06-21 DIAGNOSIS — Z79899 Other long term (current) drug therapy: Secondary | ICD-10-CM | POA: Diagnosis not present

## 2013-06-21 DIAGNOSIS — R52 Pain, unspecified: Secondary | ICD-10-CM | POA: Diagnosis not present

## 2013-06-25 DIAGNOSIS — M546 Pain in thoracic spine: Secondary | ICD-10-CM | POA: Diagnosis not present

## 2013-06-25 DIAGNOSIS — M999 Biomechanical lesion, unspecified: Secondary | ICD-10-CM | POA: Diagnosis not present

## 2013-06-25 DIAGNOSIS — M5137 Other intervertebral disc degeneration, lumbosacral region: Secondary | ICD-10-CM | POA: Diagnosis not present

## 2013-06-30 ENCOUNTER — Emergency Department (HOSPITAL_COMMUNITY)
Admission: EM | Admit: 2013-06-30 | Discharge: 2013-06-30 | Disposition: A | Discharge status: Home / Self Care | Payer: Medicare Other | Attending: Emergency Medicine | Admitting: Emergency Medicine

## 2013-06-30 ENCOUNTER — Emergency Department (HOSPITAL_COMMUNITY): Payer: Medicare Other

## 2013-06-30 ENCOUNTER — Encounter (HOSPITAL_COMMUNITY): Payer: Self-pay | Admitting: Emergency Medicine

## 2013-06-30 DIAGNOSIS — Y939 Activity, unspecified: Secondary | ICD-10-CM | POA: Insufficient documentation

## 2013-06-30 DIAGNOSIS — E669 Obesity, unspecified: Secondary | ICD-10-CM | POA: Insufficient documentation

## 2013-06-30 DIAGNOSIS — IMO0002 Reserved for concepts with insufficient information to code with codable children: Secondary | ICD-10-CM | POA: Insufficient documentation

## 2013-06-30 DIAGNOSIS — M546 Pain in thoracic spine: Secondary | ICD-10-CM | POA: Diagnosis not present

## 2013-06-30 DIAGNOSIS — Z8719 Personal history of other diseases of the digestive system: Secondary | ICD-10-CM | POA: Diagnosis not present

## 2013-06-30 DIAGNOSIS — I10 Essential (primary) hypertension: Secondary | ICD-10-CM | POA: Insufficient documentation

## 2013-06-30 DIAGNOSIS — M25559 Pain in unspecified hip: Secondary | ICD-10-CM | POA: Diagnosis not present

## 2013-06-30 DIAGNOSIS — F329 Major depressive disorder, single episode, unspecified: Secondary | ICD-10-CM | POA: Diagnosis not present

## 2013-06-30 DIAGNOSIS — S79919A Unspecified injury of unspecified hip, initial encounter: Secondary | ICD-10-CM | POA: Diagnosis not present

## 2013-06-30 DIAGNOSIS — M545 Low back pain: Secondary | ICD-10-CM | POA: Diagnosis not present

## 2013-06-30 DIAGNOSIS — I509 Heart failure, unspecified: Secondary | ICD-10-CM | POA: Insufficient documentation

## 2013-06-30 DIAGNOSIS — R079 Chest pain, unspecified: Secondary | ICD-10-CM | POA: Diagnosis not present

## 2013-06-30 DIAGNOSIS — M5137 Other intervertebral disc degeneration, lumbosacral region: Secondary | ICD-10-CM | POA: Diagnosis not present

## 2013-06-30 DIAGNOSIS — Y9289 Other specified places as the place of occurrence of the external cause: Secondary | ICD-10-CM | POA: Insufficient documentation

## 2013-06-30 DIAGNOSIS — W19XXXA Unspecified fall, initial encounter: Secondary | ICD-10-CM

## 2013-06-30 DIAGNOSIS — M069 Rheumatoid arthritis, unspecified: Secondary | ICD-10-CM | POA: Diagnosis not present

## 2013-06-30 DIAGNOSIS — Z79899 Other long term (current) drug therapy: Secondary | ICD-10-CM | POA: Insufficient documentation

## 2013-06-30 DIAGNOSIS — J449 Chronic obstructive pulmonary disease, unspecified: Secondary | ICD-10-CM | POA: Diagnosis not present

## 2013-06-30 DIAGNOSIS — S8990XA Unspecified injury of unspecified lower leg, initial encounter: Secondary | ICD-10-CM | POA: Diagnosis not present

## 2013-06-30 DIAGNOSIS — F3289 Other specified depressive episodes: Secondary | ICD-10-CM | POA: Insufficient documentation

## 2013-06-30 DIAGNOSIS — T1490XA Injury, unspecified, initial encounter: Secondary | ICD-10-CM | POA: Diagnosis not present

## 2013-06-30 DIAGNOSIS — S298XXA Other specified injuries of thorax, initial encounter: Secondary | ICD-10-CM | POA: Insufficient documentation

## 2013-06-30 DIAGNOSIS — W010XXA Fall on same level from slipping, tripping and stumbling without subsequent striking against object, initial encounter: Secondary | ICD-10-CM | POA: Insufficient documentation

## 2013-06-30 DIAGNOSIS — J4489 Other specified chronic obstructive pulmonary disease: Secondary | ICD-10-CM | POA: Insufficient documentation

## 2013-06-30 DIAGNOSIS — S3981XA Other specified injuries of abdomen, initial encounter: Secondary | ICD-10-CM | POA: Diagnosis not present

## 2013-06-30 DIAGNOSIS — Z7901 Long term (current) use of anticoagulants: Secondary | ICD-10-CM | POA: Diagnosis not present

## 2013-06-30 DIAGNOSIS — M999 Biomechanical lesion, unspecified: Secondary | ICD-10-CM | POA: Diagnosis not present

## 2013-06-30 MED ORDER — OXYCODONE-ACETAMINOPHEN 5-325 MG PO TABS
2.0000 | ORAL_TABLET | Freq: Once | ORAL | Status: AC
  Administered 2013-06-30: 2 via ORAL
  Filled 2013-06-30: qty 2

## 2013-06-30 MED ORDER — DIAZEPAM 5 MG PO TABS: 5.0000 mg | ORAL_TABLET | Freq: Three times a day (TID) | ORAL | Status: DC | PRN

## 2013-06-30 NOTE — ED Provider Notes (Signed)
CSN: [id number]     Arrival date & time 06/30/13  1318 History   First MD Initiated Contact with Patient 06/30/13 1614     Chief Complaint  Patient presents with  . Fall   (Consider location/radiation/quality/duration/timing/severity/associated sxs/prior Treatment) HPI Comments: Tripped over concrete barrier parking lot. No head injury. No syncope.  Patient is a 62 y.o. female presenting with fall. The history is provided by the patient.  Fall This is a new problem. The current episode started less than 1 hour ago. Episode frequency: Once. The problem has not changed since onset.Pertinent negatives include no chest pain and no shortness of breath. Nothing aggravates the symptoms. Nothing relieves the symptoms.    Past Medical History  Diagnosis Date  . SVT (supraventricular tachycardia)   . Chest pain, unspecified   . Congestive heart failure, unspecified   . Depressive disorder, not elsewhere classified   . 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 Unspecified essential hypertension   . Cardiac dysrhythmia, unspecified   . Palpitations   . Chronic airway obstruction, not elsewhere classified   . Obesity, unspecified   . History of rheumatoid arthritis   . Carotid bruit     Bilateral  . Syncope     admitted 09/2011   Past Surgical History  Procedure Laterality Date  . Carpal tunnel release    . Catheter ablation  02/1997    @ [facility]  . Vein surgery bladder     Family History  Problem Relation Age of Onset  . Cancer Mother   . Heart disease Mother   . Stroke     History  Substance Use Topics  . Smoking status: Never Smoker   . Smokeless tobacco: Never Used  . Alcohol Use: No   OB History   Grav Para Term Preterm Abortions TAB SAB Ect Mult Living                 Review of Systems  Constitutional: Negative for fever.  Respiratory: Negative for cough and shortness of breath.   Cardiovascular: Negative for chest pain.  Neurological:  Negative for dizziness.  All other systems reviewed and are negative.    Allergies  Adhesive and Codeine  Home Medications   Current Outpatient Rx  Name  Route  Sig  Dispense  Refill  . albuterol (PROAIR HFA) 108 (90 BASE) MCG/ACT inhaler   Inhalation   Inhale 2 puffs into the lungs 4 (four) times daily.          Marland Kitchen atorvastatin (LIPITOR) 40 MG tablet   Oral   Take 40 mg by mouth daily.         . budesonide-formoterol (SYMBICORT) 160-4.5 MCG/ACT inhaler   Inhalation   Inhale 2 puffs into the lungs 2 (two) times daily.         . Calcium Carbonate-Vitamin D (CALTRATE 600+D) 600-400 MG-UNIT per chew tablet   Oral   Chew 1 tablet by mouth daily.         . cetirizine (ZYRTEC) 10 MG tablet   Oral   Take 10 mg by mouth daily.         . clonazePAM (KLONOPIN) 0.5 MG tablet   Oral   Take 0.5 mg by mouth every morning.         . colchicine 0.6 MG tablet   Oral   Take 0.6 mg by mouth 2 (two) times daily.         . cycloSPORINE (RESTASIS) 0.05 %  ophthalmic emulsion   Both Eyes   Place 1 drop into both eyes 2 (two) times daily.         . diazepam (VALIUM) 5 MG tablet   Oral   Take 1 tablet (5 mg total) by mouth every 8 (eight) hours as needed (spasms).   15 tablet   0   . diltiazem (CARDIZEM) 30 MG tablet   Oral   Take 1 tablet (30 mg total) by mouth 2 (two) times daily.   60 tablet   6     Dose decreased 05/14/2013   . fluticasone (FLONASE) 50 MCG/ACT nasal spray   Nasal   Place 1 spray into the nose daily.          Marland Kitchen HYDROcodone-acetaminophen (NORCO) 7.5-325 MG per tablet   Oral   Take 1 tablet by mouth every 8 (eight) hours as needed for pain.          Marland Kitchen LASIX 40 MG tablet      TAKE (1) TABLET BY MOUTH ONCE DAILY.   30 each   6   . loperamide (IMODIUM A-D) 2 MG tablet   Oral   Take 2 mg by mouth 4 (four) times daily as needed for diarrhea or loose stools.         . metoprolol succinate (TOPROL-XL) 50 MG 24 hr tablet   Oral   Take  1 tablet (50 mg total) by mouth daily. Take with or immediately following a meal.   30 tablet   6     Dose decrease   . nystatin (MYCOSTATIN) powder   Topical   Apply topically as needed.         . pantoprazole (PROTONIX) 40 MG tablet   Oral   Take 40 mg by mouth daily.         Marland Kitchen PARoxetine (PAXIL) 30 MG tablet   Oral   Take 30 mg by mouth daily.         . potassium chloride (K-DUR) 10 MEQ tablet   Oral   Take 2 tablets by mouth daily.         . Simethicone (GAS-X EXTRA STRENGTH) 125 MG CAPS   Oral   Take 2 capsules by mouth as needed.         . tiotropium (SPIRIVA) 18 MCG inhalation capsule   Inhalation   Place 18 mcg into inhaler and inhale daily.         . traMADol (ULTRAM) 50 MG tablet   Oral   Take 50-100 mg by mouth 2 (two) times daily.         . traZODone (DESYREL) 50 MG tablet   Oral   Take 50 mg by mouth at bedtime.         . triamcinolone cream (KENALOG) 0.1 %   Topical   Apply 1 application topically as needed.          . warfarin (COUMADIN) 5 MG tablet   Oral   Take 5 mg by mouth daily. Managed by [facility] office           BP 121/54  Pulse 60  Temp(Src) 97.8 F (36.6 C) (Oral)  Resp 16  Ht 5\' 1"  (1.549 m)  Wt 195 lb (88.451 kg)  BMI 36.86 kg/m2  SpO2 100% Physical Exam  Nursing note and vitals reviewed. Constitutional: She is oriented to person, place, and time. She appears well-developed and well-nourished. No distress.  HENT:  Head: Normocephalic and atraumatic.  Eyes: EOM are normal. Pupils are equal, round, and reactive to light.  Neck: Normal range of motion. Neck supple.  Cardiovascular: Normal rate and regular rhythm.  Exam reveals no friction rub.   No murmur heard. Pulmonary/Chest: Effort normal and breath sounds normal. No respiratory distress. She has no wheezes. She has no rales. She exhibits tenderness (left lateral chest, no paradoxical motion, no flail segments, no crepitus).  Abdominal: Soft. She exhibits  no distension. There is no tenderness. There is no rebound.  Musculoskeletal: Normal range of motion. She exhibits no edema.       Left hip: She exhibits tenderness (left posterior thigh.). She exhibits normal range of motion and no bony tenderness.       Cervical back: She exhibits no bony tenderness.       Thoracic back: She exhibits no bony tenderness.       Lumbar back: She exhibits tenderness. She exhibits no bony tenderness.       Back:  Neurological: She is alert and oriented to person, place, and time.  Skin: She is not diaphoretic.    ED Course  Procedures (including critical care time) Labs Review Labs Reviewed - No data to display Imaging Review Dg Ribs Unilateral W/chest Left  06/30/2013   CLINICAL DATA:  Fall. Left lateral rib pain.  EXAM: LEFT RIBS AND CHEST - 3+ VIEW  COMPARISON:  02/01/2012  FINDINGS: Heart and mediastinal contours are within normal limits. No focal opacities or effusions. No acute bony abnormality. No visible rib fracture. No pneumothorax.  IMPRESSION: Negative.   Electronically Signed   By: Charlett Nose M.D.   On: 06/30/2013 14:47   Dg Lumbar Spine Complete  06/30/2013   CLINICAL DATA:  Fall. Low back pain.  EXAM: LUMBAR SPINE - COMPLETE 4+ VIEW  COMPARISON:  09/23/2012.  FINDINGS: Unchanged grade I retrolisthesis of L1 on L2. 3 mm retrolisthesis of L2 on L3 is present, which appears degenerative in associated with disc space collapse. Moderate multilevel lumbar spondylosis is present with narrowing of disc spaces and marginal osteophytes. Vertebral body height is preserved. There is no fracture or pars defect identified. Mild left hip osteoarthritis is incidentally noted. Lumbosacral junction appears normal. There is a mild levoconvex curve with the apex at L4 on the frontal view.  IMPRESSION: Moderate multilevel lumbar spondylosis with grade I retrolisthesis of L1 on L2 and L2 on L3 which is probably degenerative and associated with collapse of the disk  space at these levels. Disc space narrowing at L2-L3 has increased compared to the prior exam in conjunction with development of grade I retrolisthesis.   Electronically Signed   By: Andreas Newport M.D.   On: 06/30/2013 17:23   Dg Pelvis 1-2 Views  06/30/2013   CLINICAL DATA:  History of injury from a fall with pain.  EXAM: PELVIS - 1-2 VIEW  COMPARISON:  04/05/2013 study.  FINDINGS: SI joints and pubic symphysis appear intact. Alignment is normal. Joint spaces are preserved. No fracture or dislocation is evident. Calcific density in the soft tissues adjacent to the left iliac crest lateral margin is unchanged from previous study it may reflect previous trauma or phlebolith. There is degenerative spondylosis. There is obesity. Oval opacity in the pelvis most likely reflects a distended urinary bladder.  IMPRESSION: No fracture or dislocation is evident.   Electronically Signed   By: Onalee Hua  Call M.D.   On: 06/30/2013 17:15   Dg Femur Left  06/30/2013   CLINICAL DATA:  Fall. Left  lateral hip pain. History of multiple hip dislocations.  EXAM: LEFT FEMUR - 2 VIEW  COMPARISON:  None.  FINDINGS: No fracture. The femur is intact. Left hip is partially visualized and appears within normal limits. The left knee demonstrates chondrocalcinosis of the medial and lateral meniscus with mild medial and lateral compartment osteoarthritis. Patellofemoral osteoarthritis is also present. No effusion is identified on the lateral view.  IMPRESSION: No acute osseous abnormality.   Electronically Signed   By: Andreas Newport M.D.   On: 06/30/2013 17:21   Ct Chest Wo Contrast  06/30/2013   CLINICAL DATA:  Trauma with left rib and upper back pain. Normal radiographs.  EXAM: CT CHEST WITHOUT CONTRAST  TECHNIQUE: Multidetector CT imaging of the chest was performed following the standard protocol without IV contrast.  COMPARISON:  Plain films earlier today.  07/15/2012 chest CT.  FINDINGS: Lungs/Pleura: No nodules or airspace  opacities. No pneumothorax. No pleural fluid.  Heart/Mediastinum: Normal aortic caliber. No mediastinal hematoma. Normal heart size, without pericardial effusion. No mediastinal or definite hilar adenopathy, given limitations of unenhanced CT.  Upper Abdomen: Fatty atrophy throughout the pancreas. No free intraperitoneal air.  Bones/Musculoskeletal:  No soft tissue hematoma.  No rib fracture.  IMPRESSION: No acute or posttraumatic deformity identified within the chest.   Electronically Signed   By: Jeronimo Greaves M.D.   On: 06/30/2013 17:43    EKG Interpretation   None       MDM   1. Fall, initial encounter    23-year-old female here after mechanical fall time. Took over concrete area parking lot. No preceding symptoms. She is not on anticoagulants. No loss of consciousness or head injury. She reports left chest pain, left thigh and leg pain, left lower back pain. CT of her chest is normal. X-ray of her lower back shows extensive degenerative disease however no acute fractures. X-rays of her left thigh and pelvis normal. She did ambulate after given pain medicine. I instructed her to take muscle relaxers and NSAIDs for relief. Instructed to followup with PCP. Stable for discharge.    Dagmar Hait, MD 06/30/13 2350

## 2013-06-30 NOTE — ED Notes (Addendum)
Tripped and fell onto a cement bumper in parking lot. C/o left rib pain. left thigh pain. Upper mid back pain. Denies hitting head. Loc. No obvious deformities. Nad. Denies sob.

## 2013-07-01 DIAGNOSIS — Z79899 Other long term (current) drug therapy: Secondary | ICD-10-CM | POA: Diagnosis not present

## 2013-07-01 DIAGNOSIS — Z7901 Long term (current) use of anticoagulants: Secondary | ICD-10-CM | POA: Diagnosis not present

## 2013-07-02 DIAGNOSIS — M546 Pain in thoracic spine: Secondary | ICD-10-CM | POA: Diagnosis not present

## 2013-07-02 DIAGNOSIS — M999 Biomechanical lesion, unspecified: Secondary | ICD-10-CM | POA: Diagnosis not present

## 2013-07-02 DIAGNOSIS — M5137 Other intervertebral disc degeneration, lumbosacral region: Secondary | ICD-10-CM | POA: Diagnosis not present

## 2013-07-09 DIAGNOSIS — M546 Pain in thoracic spine: Secondary | ICD-10-CM | POA: Diagnosis not present

## 2013-07-09 DIAGNOSIS — M999 Biomechanical lesion, unspecified: Secondary | ICD-10-CM | POA: Diagnosis not present

## 2013-07-09 DIAGNOSIS — M5137 Other intervertebral disc degeneration, lumbosacral region: Secondary | ICD-10-CM | POA: Diagnosis not present

## 2013-07-15 DIAGNOSIS — Z7901 Long term (current) use of anticoagulants: Secondary | ICD-10-CM | POA: Diagnosis not present

## 2013-07-15 DIAGNOSIS — M546 Pain in thoracic spine: Secondary | ICD-10-CM | POA: Diagnosis not present

## 2013-07-15 DIAGNOSIS — M5137 Other intervertebral disc degeneration, lumbosacral region: Secondary | ICD-10-CM | POA: Diagnosis not present

## 2013-07-15 DIAGNOSIS — M999 Biomechanical lesion, unspecified: Secondary | ICD-10-CM | POA: Diagnosis not present

## 2013-07-22 DIAGNOSIS — M546 Pain in thoracic spine: Secondary | ICD-10-CM | POA: Diagnosis not present

## 2013-07-22 DIAGNOSIS — M5137 Other intervertebral disc degeneration, lumbosacral region: Secondary | ICD-10-CM | POA: Diagnosis not present

## 2013-07-22 DIAGNOSIS — M999 Biomechanical lesion, unspecified: Secondary | ICD-10-CM | POA: Diagnosis not present

## 2013-07-27 DIAGNOSIS — Z1231 Encounter for screening mammogram for malignant neoplasm of breast: Secondary | ICD-10-CM | POA: Diagnosis not present

## 2013-07-29 DIAGNOSIS — Z7901 Long term (current) use of anticoagulants: Secondary | ICD-10-CM | POA: Diagnosis not present

## 2013-08-05 DIAGNOSIS — Z23 Encounter for immunization: Secondary | ICD-10-CM | POA: Diagnosis not present

## 2013-08-17 DIAGNOSIS — M546 Pain in thoracic spine: Secondary | ICD-10-CM | POA: Diagnosis not present

## 2013-08-17 DIAGNOSIS — M5137 Other intervertebral disc degeneration, lumbosacral region: Secondary | ICD-10-CM | POA: Diagnosis not present

## 2013-08-17 DIAGNOSIS — M999 Biomechanical lesion, unspecified: Secondary | ICD-10-CM | POA: Diagnosis not present

## 2013-08-18 DIAGNOSIS — M5430 Sciatica, unspecified side: Secondary | ICD-10-CM | POA: Diagnosis not present

## 2013-08-19 DIAGNOSIS — Z7901 Long term (current) use of anticoagulants: Secondary | ICD-10-CM | POA: Diagnosis not present

## 2013-09-02 DIAGNOSIS — Z7901 Long term (current) use of anticoagulants: Secondary | ICD-10-CM | POA: Diagnosis not present

## 2013-09-17 DIAGNOSIS — Z7901 Long term (current) use of anticoagulants: Secondary | ICD-10-CM | POA: Diagnosis not present

## 2013-09-17 DIAGNOSIS — E782 Mixed hyperlipidemia: Secondary | ICD-10-CM | POA: Diagnosis not present

## 2013-09-17 DIAGNOSIS — E119 Type 2 diabetes mellitus without complications: Secondary | ICD-10-CM | POA: Diagnosis not present

## 2013-09-17 DIAGNOSIS — Z131 Encounter for screening for diabetes mellitus: Secondary | ICD-10-CM | POA: Diagnosis not present

## 2013-09-17 DIAGNOSIS — I10 Essential (primary) hypertension: Secondary | ICD-10-CM | POA: Diagnosis not present

## 2013-09-17 DIAGNOSIS — M76899 Other specified enthesopathies of unspecified lower limb, excluding foot: Secondary | ICD-10-CM | POA: Diagnosis not present

## 2013-09-30 DIAGNOSIS — Z7901 Long term (current) use of anticoagulants: Secondary | ICD-10-CM | POA: Diagnosis not present

## 2013-10-14 DIAGNOSIS — Z7901 Long term (current) use of anticoagulants: Secondary | ICD-10-CM | POA: Diagnosis not present

## 2013-10-26 DIAGNOSIS — M999 Biomechanical lesion, unspecified: Secondary | ICD-10-CM | POA: Diagnosis not present

## 2013-10-26 DIAGNOSIS — M546 Pain in thoracic spine: Secondary | ICD-10-CM | POA: Diagnosis not present

## 2013-10-26 DIAGNOSIS — M5137 Other intervertebral disc degeneration, lumbosacral region: Secondary | ICD-10-CM | POA: Diagnosis not present

## 2013-10-28 DIAGNOSIS — Z7901 Long term (current) use of anticoagulants: Secondary | ICD-10-CM | POA: Diagnosis not present

## 2013-11-11 DIAGNOSIS — Z7901 Long term (current) use of anticoagulants: Secondary | ICD-10-CM | POA: Diagnosis not present

## 2013-11-17 DIAGNOSIS — J209 Acute bronchitis, unspecified: Secondary | ICD-10-CM | POA: Diagnosis not present

## 2013-11-17 DIAGNOSIS — R5381 Other malaise: Secondary | ICD-10-CM | POA: Diagnosis not present

## 2013-11-17 DIAGNOSIS — R5383 Other fatigue: Secondary | ICD-10-CM | POA: Diagnosis not present

## 2013-11-18 DIAGNOSIS — M25569 Pain in unspecified knee: Secondary | ICD-10-CM | POA: Diagnosis not present

## 2013-11-18 DIAGNOSIS — M1710 Unilateral primary osteoarthritis, unspecified knee: Secondary | ICD-10-CM | POA: Diagnosis not present

## 2013-11-18 DIAGNOSIS — M1120 Other chondrocalcinosis, unspecified site: Secondary | ICD-10-CM | POA: Diagnosis not present

## 2013-11-18 DIAGNOSIS — M19049 Primary osteoarthritis, unspecified hand: Secondary | ICD-10-CM | POA: Diagnosis not present

## 2013-11-25 DIAGNOSIS — Z7901 Long term (current) use of anticoagulants: Secondary | ICD-10-CM | POA: Diagnosis not present

## 2013-11-29 DIAGNOSIS — F329 Major depressive disorder, single episode, unspecified: Secondary | ICD-10-CM | POA: Diagnosis not present

## 2013-11-29 DIAGNOSIS — J449 Chronic obstructive pulmonary disease, unspecified: Secondary | ICD-10-CM | POA: Diagnosis not present

## 2013-11-29 DIAGNOSIS — R404 Transient alteration of awareness: Secondary | ICD-10-CM | POA: Diagnosis not present

## 2013-11-29 DIAGNOSIS — Z8249 Family history of ischemic heart disease and other diseases of the circulatory system: Secondary | ICD-10-CM | POA: Diagnosis not present

## 2013-11-29 DIAGNOSIS — I10 Essential (primary) hypertension: Secondary | ICD-10-CM | POA: Diagnosis not present

## 2013-11-29 DIAGNOSIS — J069 Acute upper respiratory infection, unspecified: Secondary | ICD-10-CM | POA: Diagnosis not present

## 2013-11-29 DIAGNOSIS — Z7901 Long term (current) use of anticoagulants: Secondary | ICD-10-CM | POA: Diagnosis not present

## 2013-11-29 DIAGNOSIS — R059 Cough, unspecified: Secondary | ICD-10-CM | POA: Diagnosis not present

## 2013-11-29 DIAGNOSIS — Z79899 Other long term (current) drug therapy: Secondary | ICD-10-CM | POA: Diagnosis not present

## 2013-11-29 DIAGNOSIS — R42 Dizziness and giddiness: Secondary | ICD-10-CM | POA: Diagnosis not present

## 2013-11-29 DIAGNOSIS — F3289 Other specified depressive episodes: Secondary | ICD-10-CM | POA: Diagnosis not present

## 2013-11-29 DIAGNOSIS — R05 Cough: Secondary | ICD-10-CM | POA: Diagnosis not present

## 2013-12-07 DIAGNOSIS — J40 Bronchitis, not specified as acute or chronic: Secondary | ICD-10-CM | POA: Diagnosis not present

## 2013-12-08 ENCOUNTER — Other Ambulatory Visit: Payer: Self-pay | Admitting: Cardiology

## 2013-12-14 DIAGNOSIS — M546 Pain in thoracic spine: Secondary | ICD-10-CM | POA: Diagnosis not present

## 2013-12-14 DIAGNOSIS — M999 Biomechanical lesion, unspecified: Secondary | ICD-10-CM | POA: Diagnosis not present

## 2013-12-14 DIAGNOSIS — M5137 Other intervertebral disc degeneration, lumbosacral region: Secondary | ICD-10-CM | POA: Diagnosis not present

## 2013-12-15 DIAGNOSIS — H40019 Open angle with borderline findings, low risk, unspecified eye: Secondary | ICD-10-CM | POA: Diagnosis not present

## 2013-12-15 DIAGNOSIS — H2510 Age-related nuclear cataract, unspecified eye: Secondary | ICD-10-CM | POA: Diagnosis not present

## 2013-12-15 DIAGNOSIS — H3589 Other specified retinal disorders: Secondary | ICD-10-CM | POA: Diagnosis not present

## 2013-12-16 DIAGNOSIS — Z7901 Long term (current) use of anticoagulants: Secondary | ICD-10-CM | POA: Diagnosis not present

## 2013-12-23 DIAGNOSIS — M546 Pain in thoracic spine: Secondary | ICD-10-CM | POA: Diagnosis not present

## 2013-12-23 DIAGNOSIS — M999 Biomechanical lesion, unspecified: Secondary | ICD-10-CM | POA: Diagnosis not present

## 2013-12-23 DIAGNOSIS — Z09 Encounter for follow-up examination after completed treatment for conditions other than malignant neoplasm: Secondary | ICD-10-CM | POA: Diagnosis not present

## 2013-12-23 DIAGNOSIS — M5137 Other intervertebral disc degeneration, lumbosacral region: Secondary | ICD-10-CM | POA: Diagnosis not present

## 2013-12-23 DIAGNOSIS — Z0389 Encounter for observation for other suspected diseases and conditions ruled out: Secondary | ICD-10-CM | POA: Diagnosis not present

## 2013-12-30 DIAGNOSIS — Z7901 Long term (current) use of anticoagulants: Secondary | ICD-10-CM | POA: Diagnosis not present

## 2014-01-08 DIAGNOSIS — M999 Biomechanical lesion, unspecified: Secondary | ICD-10-CM | POA: Diagnosis not present

## 2014-01-08 DIAGNOSIS — M5137 Other intervertebral disc degeneration, lumbosacral region: Secondary | ICD-10-CM | POA: Diagnosis not present

## 2014-01-08 DIAGNOSIS — M546 Pain in thoracic spine: Secondary | ICD-10-CM | POA: Diagnosis not present

## 2014-01-14 DIAGNOSIS — Z7901 Long term (current) use of anticoagulants: Secondary | ICD-10-CM | POA: Diagnosis not present

## 2014-01-16 DIAGNOSIS — J449 Chronic obstructive pulmonary disease, unspecified: Secondary | ICD-10-CM | POA: Diagnosis not present

## 2014-01-16 DIAGNOSIS — F329 Major depressive disorder, single episode, unspecified: Secondary | ICD-10-CM | POA: Diagnosis not present

## 2014-01-16 DIAGNOSIS — M545 Low back pain, unspecified: Secondary | ICD-10-CM | POA: Diagnosis not present

## 2014-01-16 DIAGNOSIS — Z86711 Personal history of pulmonary embolism: Secondary | ICD-10-CM | POA: Diagnosis not present

## 2014-01-16 DIAGNOSIS — Z7901 Long term (current) use of anticoagulants: Secondary | ICD-10-CM | POA: Diagnosis not present

## 2014-01-16 DIAGNOSIS — I10 Essential (primary) hypertension: Secondary | ICD-10-CM | POA: Diagnosis not present

## 2014-01-16 DIAGNOSIS — R300 Dysuria: Secondary | ICD-10-CM | POA: Diagnosis not present

## 2014-01-16 DIAGNOSIS — F3289 Other specified depressive episodes: Secondary | ICD-10-CM | POA: Diagnosis not present

## 2014-01-16 DIAGNOSIS — N390 Urinary tract infection, site not specified: Secondary | ICD-10-CM | POA: Diagnosis not present

## 2014-01-16 DIAGNOSIS — R109 Unspecified abdominal pain: Secondary | ICD-10-CM | POA: Diagnosis not present

## 2014-01-16 DIAGNOSIS — Z79899 Other long term (current) drug therapy: Secondary | ICD-10-CM | POA: Diagnosis not present

## 2014-01-16 DIAGNOSIS — Z8249 Family history of ischemic heart disease and other diseases of the circulatory system: Secondary | ICD-10-CM | POA: Diagnosis not present

## 2014-01-27 DIAGNOSIS — Z7901 Long term (current) use of anticoagulants: Secondary | ICD-10-CM | POA: Diagnosis not present

## 2014-02-01 DIAGNOSIS — I10 Essential (primary) hypertension: Secondary | ICD-10-CM | POA: Diagnosis not present

## 2014-02-10 DIAGNOSIS — IMO0002 Reserved for concepts with insufficient information to code with codable children: Secondary | ICD-10-CM | POA: Diagnosis not present

## 2014-02-10 DIAGNOSIS — M5137 Other intervertebral disc degeneration, lumbosacral region: Secondary | ICD-10-CM | POA: Diagnosis not present

## 2014-02-10 DIAGNOSIS — J449 Chronic obstructive pulmonary disease, unspecified: Secondary | ICD-10-CM | POA: Diagnosis not present

## 2014-02-10 DIAGNOSIS — I10 Essential (primary) hypertension: Secondary | ICD-10-CM | POA: Diagnosis not present

## 2014-02-10 DIAGNOSIS — L293 Anogenital pruritus, unspecified: Secondary | ICD-10-CM | POA: Diagnosis not present

## 2014-02-10 DIAGNOSIS — Z79899 Other long term (current) drug therapy: Secondary | ICD-10-CM | POA: Diagnosis not present

## 2014-02-10 DIAGNOSIS — F329 Major depressive disorder, single episode, unspecified: Secondary | ICD-10-CM | POA: Diagnosis not present

## 2014-02-10 DIAGNOSIS — R42 Dizziness and giddiness: Secondary | ICD-10-CM | POA: Diagnosis not present

## 2014-02-10 DIAGNOSIS — Z8249 Family history of ischemic heart disease and other diseases of the circulatory system: Secondary | ICD-10-CM | POA: Diagnosis not present

## 2014-02-10 DIAGNOSIS — R404 Transient alteration of awareness: Secondary | ICD-10-CM | POA: Diagnosis not present

## 2014-02-10 DIAGNOSIS — Z7901 Long term (current) use of anticoagulants: Secondary | ICD-10-CM | POA: Diagnosis not present

## 2014-02-10 DIAGNOSIS — M999 Biomechanical lesion, unspecified: Secondary | ICD-10-CM | POA: Diagnosis not present

## 2014-02-10 DIAGNOSIS — R51 Headache: Secondary | ICD-10-CM | POA: Diagnosis not present

## 2014-02-10 DIAGNOSIS — F3289 Other specified depressive episodes: Secondary | ICD-10-CM | POA: Diagnosis not present

## 2014-02-10 DIAGNOSIS — Z86711 Personal history of pulmonary embolism: Secondary | ICD-10-CM | POA: Diagnosis not present

## 2014-02-10 DIAGNOSIS — M546 Pain in thoracic spine: Secondary | ICD-10-CM | POA: Diagnosis not present

## 2014-02-13 DIAGNOSIS — Z79899 Other long term (current) drug therapy: Secondary | ICD-10-CM | POA: Diagnosis not present

## 2014-02-19 DIAGNOSIS — R269 Unspecified abnormalities of gait and mobility: Secondary | ICD-10-CM | POA: Diagnosis not present

## 2014-02-19 DIAGNOSIS — R42 Dizziness and giddiness: Secondary | ICD-10-CM | POA: Diagnosis not present

## 2014-02-19 DIAGNOSIS — IMO0001 Reserved for inherently not codable concepts without codable children: Secondary | ICD-10-CM | POA: Diagnosis not present

## 2014-02-19 DIAGNOSIS — R5383 Other fatigue: Secondary | ICD-10-CM | POA: Diagnosis not present

## 2014-02-19 DIAGNOSIS — R5381 Other malaise: Secondary | ICD-10-CM | POA: Diagnosis not present

## 2014-02-19 DIAGNOSIS — Z136 Encounter for screening for cardiovascular disorders: Secondary | ICD-10-CM | POA: Diagnosis not present

## 2014-02-22 DIAGNOSIS — M542 Cervicalgia: Secondary | ICD-10-CM | POA: Diagnosis not present

## 2014-02-22 DIAGNOSIS — M999 Biomechanical lesion, unspecified: Secondary | ICD-10-CM | POA: Diagnosis not present

## 2014-02-22 DIAGNOSIS — M9981 Other biomechanical lesions of cervical region: Secondary | ICD-10-CM | POA: Diagnosis not present

## 2014-02-22 DIAGNOSIS — M5137 Other intervertebral disc degeneration, lumbosacral region: Secondary | ICD-10-CM | POA: Diagnosis not present

## 2014-02-24 DIAGNOSIS — M1710 Unilateral primary osteoarthritis, unspecified knee: Secondary | ICD-10-CM | POA: Diagnosis not present

## 2014-02-25 DIAGNOSIS — Z7901 Long term (current) use of anticoagulants: Secondary | ICD-10-CM | POA: Diagnosis not present

## 2014-03-01 DIAGNOSIS — M999 Biomechanical lesion, unspecified: Secondary | ICD-10-CM | POA: Diagnosis not present

## 2014-03-01 DIAGNOSIS — M5137 Other intervertebral disc degeneration, lumbosacral region: Secondary | ICD-10-CM | POA: Diagnosis not present

## 2014-03-01 DIAGNOSIS — M542 Cervicalgia: Secondary | ICD-10-CM | POA: Diagnosis not present

## 2014-03-01 DIAGNOSIS — M9981 Other biomechanical lesions of cervical region: Secondary | ICD-10-CM | POA: Diagnosis not present

## 2014-03-10 DIAGNOSIS — M1710 Unilateral primary osteoarthritis, unspecified knee: Secondary | ICD-10-CM | POA: Diagnosis not present

## 2014-03-11 DIAGNOSIS — Z7901 Long term (current) use of anticoagulants: Secondary | ICD-10-CM | POA: Diagnosis not present

## 2014-03-17 DIAGNOSIS — M1710 Unilateral primary osteoarthritis, unspecified knee: Secondary | ICD-10-CM | POA: Diagnosis not present

## 2014-03-18 ENCOUNTER — Other Ambulatory Visit: Payer: Self-pay | Admitting: Cardiovascular Disease

## 2014-03-22 DIAGNOSIS — M545 Low back pain, unspecified: Secondary | ICD-10-CM | POA: Diagnosis not present

## 2014-03-22 DIAGNOSIS — M542 Cervicalgia: Secondary | ICD-10-CM | POA: Diagnosis not present

## 2014-03-22 DIAGNOSIS — M9981 Other biomechanical lesions of cervical region: Secondary | ICD-10-CM | POA: Diagnosis not present

## 2014-03-22 DIAGNOSIS — M546 Pain in thoracic spine: Secondary | ICD-10-CM | POA: Diagnosis not present

## 2014-03-25 DIAGNOSIS — Z7901 Long term (current) use of anticoagulants: Secondary | ICD-10-CM | POA: Diagnosis not present

## 2014-03-25 DIAGNOSIS — Z79899 Other long term (current) drug therapy: Secondary | ICD-10-CM | POA: Diagnosis not present

## 2014-04-05 DIAGNOSIS — M1710 Unilateral primary osteoarthritis, unspecified knee: Secondary | ICD-10-CM | POA: Diagnosis not present

## 2014-04-09 DIAGNOSIS — M546 Pain in thoracic spine: Secondary | ICD-10-CM | POA: Diagnosis not present

## 2014-04-09 DIAGNOSIS — M542 Cervicalgia: Secondary | ICD-10-CM | POA: Diagnosis not present

## 2014-04-09 DIAGNOSIS — M545 Low back pain, unspecified: Secondary | ICD-10-CM | POA: Diagnosis not present

## 2014-04-09 DIAGNOSIS — M9981 Other biomechanical lesions of cervical region: Secondary | ICD-10-CM | POA: Diagnosis not present

## 2014-04-12 DIAGNOSIS — M1710 Unilateral primary osteoarthritis, unspecified knee: Secondary | ICD-10-CM | POA: Diagnosis not present

## 2014-04-15 DIAGNOSIS — Z7901 Long term (current) use of anticoagulants: Secondary | ICD-10-CM | POA: Diagnosis not present

## 2014-04-20 DIAGNOSIS — J449 Chronic obstructive pulmonary disease, unspecified: Secondary | ICD-10-CM | POA: Diagnosis not present

## 2014-04-26 DIAGNOSIS — J018 Other acute sinusitis: Secondary | ICD-10-CM | POA: Diagnosis not present

## 2014-04-30 DIAGNOSIS — Z7901 Long term (current) use of anticoagulants: Secondary | ICD-10-CM | POA: Diagnosis not present

## 2014-05-13 DIAGNOSIS — Z7901 Long term (current) use of anticoagulants: Secondary | ICD-10-CM | POA: Diagnosis not present

## 2014-05-14 ENCOUNTER — Ambulatory Visit (INDEPENDENT_AMBULATORY_CARE_PROVIDER_SITE_OTHER): Payer: Medicare Other | Admitting: Cardiovascular Disease

## 2014-05-14 ENCOUNTER — Encounter: Payer: Self-pay | Admitting: Cardiovascular Disease

## 2014-05-14 VITALS — BP 140/82 | HR 65 | Ht 61.0 in | Wt 184.0 lb

## 2014-05-14 DIAGNOSIS — I2699 Other pulmonary embolism without acute cor pulmonale: Secondary | ICD-10-CM

## 2014-05-14 DIAGNOSIS — R000 Tachycardia, unspecified: Secondary | ICD-10-CM

## 2014-05-14 DIAGNOSIS — I471 Supraventricular tachycardia: Secondary | ICD-10-CM

## 2014-05-14 DIAGNOSIS — I5032 Chronic diastolic (congestive) heart failure: Secondary | ICD-10-CM

## 2014-05-14 DIAGNOSIS — I10 Essential (primary) hypertension: Secondary | ICD-10-CM

## 2014-05-14 DIAGNOSIS — R002 Palpitations: Secondary | ICD-10-CM

## 2014-05-14 NOTE — Patient Instructions (Signed)
Continue all current medications. Your physician wants you to follow up in:  1 year.  You will receive a reminder letter in the mail one-two months in advance.  If you don't receive a letter, please call our office to schedule the follow up appointment   

## 2014-05-14 NOTE — Progress Notes (Signed)
Patient ID: Dorothy Ford, female   DOB: [birth year], 63 y.o.   MRN: [id number]      SUBJECTIVE: Mrs. Hamada presents for follow up of palpitations and sinus tachycardia. She has a history of PSVT, HTN, chronic diastolic heart failure, COPD, and pulmonary embolism. She was evaluated in the emergency department at [facility] on 02/11/2014, but does not remember why and I do not have notes from that visit. Relevant labs include BUN 14, creatinine 0.73, sodium 136, calcium 9.1, potassium 3.9, white blood cell 5.6, INR 1.2, hemoglobin 12.5, platelets 247. ECG demonstrated normal sinus rhythm, heart rate 55 beats per minute.  She has a host of somatic complaints relating to anxiety and panic attacks as well as fibromyalgia. Her palpitations are more pronounced when she has a panic attack. She is also distressed because her primary care provider will not refill her anxiolytics until next month. She tells me she has been diagnosed with hypothyroidism and has also had repeated sinus and bladder infections this past year, and has been dealing with seasonal allergies.  Review of Systems: As per "subjective", otherwise negative.  Allergies  Allergen Reactions  . Adhesive [Tape]   . Codeine     REACTION: vomiting    Current Outpatient Prescriptions  Medication Sig Dispense Refill  . albuterol (PROAIR HFA) 108 (90 BASE) MCG/ACT inhaler Inhale 2 puffs into the lungs 4 (four) times daily.       Marland Kitchen atorvastatin (LIPITOR) 40 MG tablet Take 40 mg by mouth daily.      . budesonide-formoterol (SYMBICORT) 160-4.5 MCG/ACT inhaler Inhale 2 puffs into the lungs 2 (two) times daily.      . Calcium Carbonate-Vitamin D (CALTRATE 600+D) 600-400 MG-UNIT per chew tablet Chew 1 tablet by mouth daily.      . cetirizine (ZYRTEC) 10 MG tablet Take 10 mg by mouth daily.      . clonazePAM (KLONOPIN) 0.5 MG tablet Take 0.5 mg by mouth every morning.      . colchicine 0.6 MG tablet Take 0.6 mg by mouth 2 (two)  times daily.      . cycloSPORINE (RESTASIS) 0.05 % ophthalmic emulsion Place 1 drop into both eyes 2 (two) times daily.      . diazepam (VALIUM) 5 MG tablet Take 1 tablet (5 mg total) by mouth every 8 (eight) hours as needed (spasms).  15 tablet  0  . diltiazem (CARDIZEM) 30 MG tablet Take 1 tablet (30 mg total) by mouth 2 (two) times daily.  60 tablet  6  . fluticasone (FLONASE) 50 MCG/ACT nasal spray Place 1 spray into the nose daily.       . furosemide (LASIX) 40 MG tablet TAKE 1 TABLET ONCE DAILY.  30 tablet  6  . HYDROcodone-acetaminophen (NORCO) 7.5-325 MG per tablet Take 1 tablet by mouth every 8 (eight) hours as needed for pain.       Marland Kitchen loperamide (IMODIUM A-D) 2 MG tablet Take 2 mg by mouth 4 (four) times daily as needed for diarrhea or loose stools.      . metoprolol succinate (TOPROL-XL) 50 MG 24 hr tablet TAKE 1 TABLET BY MOUTH ONCE DAILY WITH OR IMMEDIATELY FOLLOWING A MEAL.  30 tablet  6  . nystatin (MYCOSTATIN) powder Apply topically as needed.      . pantoprazole (PROTONIX) 40 MG tablet Take 40 mg by mouth daily.      Marland Kitchen PARoxetine (PAXIL) 30 MG tablet Take 30 mg by mouth daily.      Marland Kitchen  potassium chloride (K-DUR) 10 MEQ tablet Take 2 tablets by mouth daily.      . Simethicone (GAS-X EXTRA STRENGTH) 125 MG CAPS Take 2 capsules by mouth as needed.      . tiotropium (SPIRIVA) 18 MCG inhalation capsule Place 18 mcg into inhaler and inhale daily.      . traMADol (ULTRAM) 50 MG tablet Take 50-100 mg by mouth 2 (two) times daily.      . traZODone (DESYREL) 50 MG tablet Take 50 mg by mouth at bedtime.      . triamcinolone cream (KENALOG) 0.1 % Apply 1 application topically as needed.       . warfarin (COUMADIN) 5 MG tablet Take 5 mg by mouth daily. Managed by [facility] office        No current facility-administered medications for this visit.    Past Medical History  Diagnosis Date  . SVT (supraventricular tachycardia)   . Chest pain, unspecified   . Congestive heart failure,  unspecified   . Depressive disorder, not elsewhere classified   . 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 Unspecified essential hypertension   . Cardiac dysrhythmia, unspecified   . Palpitations   . Chronic airway obstruction, not elsewhere classified   . Obesity, unspecified   . History of rheumatoid arthritis   . Carotid bruit     Bilateral  . Syncope     admitted 09/2011    Past Surgical History  Procedure Laterality Date  . Carpal tunnel release    . Catheter ablation  02/1997    @ [place]  . Vein surgery bladder      History   Social History  . Marital Status: Single    Spouse Name: N/A    Number of Children: N/A  . Years of Education: N/A   Occupational History  . Not on file.   Social History Main Topics  . Smoking status: Never Smoker   . Smokeless tobacco: Never Used  . Alcohol Use: No  . Drug Use: No  . Sexual Activity: Not on file   Other Topics Concern  . Not on file   Social History Narrative  . No narrative on file    BP 140/82  Pulse 65 SpO2 99%  Weight 184 lb (83.462 kg)  Height 5\' 1"  (1.549 m)   PHYSICAL EXAM General: NAD HEENT: Normal. Neck: No JVD, no thyromegaly. Lungs: Clear to auscultation bilaterally with normal respiratory effort. CV: Nondisplaced PMI.  Regular rate and rhythm, normal S1/S2, no S3/S4, no murmur. No pretibial or periankle edema.  No carotid bruit.  Normal pedal pulses.  Abdomen: Soft, nontender, no hepatosplenomegaly, no distention.  Neurologic: Alert and oriented x 3.  Psych: Flat affect. Skin: Normal. Musculoskeletal: Normal range of motion, no gross deformities. Extremities: No clubbing or cyanosis.   ECG: Most recent ECG reviewed.      ASSESSMENT AND PLAN:  Palpitations/Sinus Tachycardia/History of PSVT Stable on diltiazem and metoprolol. She apparently had a previous RF ablation procedure in 1998, at [facility], which was unsuccessful.    Chronic diastolic  heart failure  Euvolemic by history and exam.   Pulmonary embolism  On chronic Coumadin anticoagulation, followed by primary M.D.   Essential HTN Borderline elevation today. Will monitor.  Dispo: f/u 1 year.  Kate Sable, M.D., F.A.C.C.

## 2014-05-24 DIAGNOSIS — M1710 Unilateral primary osteoarthritis, unspecified knee: Secondary | ICD-10-CM | POA: Diagnosis not present

## 2014-05-24 DIAGNOSIS — M19049 Primary osteoarthritis, unspecified hand: Secondary | ICD-10-CM | POA: Diagnosis not present

## 2014-05-24 DIAGNOSIS — M1120 Other chondrocalcinosis, unspecified site: Secondary | ICD-10-CM | POA: Diagnosis not present

## 2014-05-24 DIAGNOSIS — M25569 Pain in unspecified knee: Secondary | ICD-10-CM | POA: Diagnosis not present

## 2014-06-04 DIAGNOSIS — M5032 Other cervical disc degeneration, mid-cervical region: Secondary | ICD-10-CM | POA: Diagnosis not present

## 2014-06-04 DIAGNOSIS — Z7901 Long term (current) use of anticoagulants: Secondary | ICD-10-CM | POA: Diagnosis not present

## 2014-06-04 DIAGNOSIS — M9901 Segmental and somatic dysfunction of cervical region: Secondary | ICD-10-CM | POA: Diagnosis not present

## 2014-06-04 DIAGNOSIS — M9902 Segmental and somatic dysfunction of thoracic region: Secondary | ICD-10-CM | POA: Diagnosis not present

## 2014-06-04 DIAGNOSIS — M5136 Other intervertebral disc degeneration, lumbar region: Secondary | ICD-10-CM | POA: Diagnosis not present

## 2014-06-04 DIAGNOSIS — M546 Pain in thoracic spine: Secondary | ICD-10-CM | POA: Diagnosis not present

## 2014-06-04 DIAGNOSIS — M9903 Segmental and somatic dysfunction of lumbar region: Secondary | ICD-10-CM | POA: Diagnosis not present

## 2014-06-10 DIAGNOSIS — M19042 Primary osteoarthritis, left hand: Secondary | ICD-10-CM | POA: Diagnosis not present

## 2014-06-10 DIAGNOSIS — W06XXXA Fall from bed, initial encounter: Secondary | ICD-10-CM | POA: Diagnosis not present

## 2014-06-10 DIAGNOSIS — H409 Unspecified glaucoma: Secondary | ICD-10-CM | POA: Diagnosis not present

## 2014-06-10 DIAGNOSIS — I10 Essential (primary) hypertension: Secondary | ICD-10-CM | POA: Diagnosis not present

## 2014-06-10 DIAGNOSIS — J449 Chronic obstructive pulmonary disease, unspecified: Secondary | ICD-10-CM | POA: Diagnosis not present

## 2014-06-10 DIAGNOSIS — Z9889 Other specified postprocedural states: Secondary | ICD-10-CM | POA: Diagnosis not present

## 2014-06-10 DIAGNOSIS — M797 Fibromyalgia: Secondary | ICD-10-CM | POA: Diagnosis not present

## 2014-06-10 DIAGNOSIS — M488X9 Other specified spondylopathies, site unspecified: Secondary | ICD-10-CM | POA: Diagnosis not present

## 2014-06-10 DIAGNOSIS — Z86718 Personal history of other venous thrombosis and embolism: Secondary | ICD-10-CM | POA: Diagnosis not present

## 2014-06-10 DIAGNOSIS — M2550 Pain in unspecified joint: Secondary | ICD-10-CM | POA: Diagnosis not present

## 2014-06-10 DIAGNOSIS — S0012XA Contusion of left eyelid and periocular area, initial encounter: Secondary | ICD-10-CM | POA: Diagnosis not present

## 2014-06-10 DIAGNOSIS — R51 Headache: Secondary | ICD-10-CM | POA: Diagnosis not present

## 2014-06-10 DIAGNOSIS — H269 Unspecified cataract: Secondary | ICD-10-CM | POA: Diagnosis not present

## 2014-06-10 DIAGNOSIS — Z79899 Other long term (current) drug therapy: Secondary | ICD-10-CM | POA: Diagnosis not present

## 2014-06-10 DIAGNOSIS — F329 Major depressive disorder, single episode, unspecified: Secondary | ICD-10-CM | POA: Diagnosis not present

## 2014-06-10 DIAGNOSIS — R000 Tachycardia, unspecified: Secondary | ICD-10-CM | POA: Diagnosis not present

## 2014-06-10 DIAGNOSIS — M79645 Pain in left finger(s): Secondary | ICD-10-CM | POA: Diagnosis not present

## 2014-06-10 DIAGNOSIS — S0990XA Unspecified injury of head, initial encounter: Secondary | ICD-10-CM | POA: Diagnosis not present

## 2014-06-10 DIAGNOSIS — E039 Hypothyroidism, unspecified: Secondary | ICD-10-CM | POA: Diagnosis not present

## 2014-06-10 DIAGNOSIS — M2540 Effusion, unspecified joint: Secondary | ICD-10-CM | POA: Diagnosis not present

## 2014-06-10 DIAGNOSIS — I509 Heart failure, unspecified: Secondary | ICD-10-CM | POA: Diagnosis not present

## 2014-06-10 DIAGNOSIS — Z7901 Long term (current) use of anticoagulants: Secondary | ICD-10-CM | POA: Diagnosis not present

## 2014-06-10 DIAGNOSIS — J45909 Unspecified asthma, uncomplicated: Secondary | ICD-10-CM | POA: Diagnosis not present

## 2014-06-10 DIAGNOSIS — S63611A Unspecified sprain of left index finger, initial encounter: Secondary | ICD-10-CM | POA: Diagnosis not present

## 2014-06-10 DIAGNOSIS — S6982XA Other specified injuries of left wrist, hand and finger(s), initial encounter: Secondary | ICD-10-CM | POA: Diagnosis not present

## 2014-06-10 DIAGNOSIS — Z885 Allergy status to narcotic agent status: Secondary | ICD-10-CM | POA: Diagnosis not present

## 2014-06-10 DIAGNOSIS — Z791 Long term (current) use of non-steroidal anti-inflammatories (NSAID): Secondary | ICD-10-CM | POA: Diagnosis not present

## 2014-06-10 DIAGNOSIS — Z91048 Other nonmedicinal substance allergy status: Secondary | ICD-10-CM | POA: Diagnosis not present

## 2014-06-10 DIAGNOSIS — G4730 Sleep apnea, unspecified: Secondary | ICD-10-CM | POA: Diagnosis not present

## 2014-06-10 DIAGNOSIS — S098XXA Other specified injuries of head, initial encounter: Secondary | ICD-10-CM | POA: Diagnosis not present

## 2014-06-15 DIAGNOSIS — H25813 Combined forms of age-related cataract, bilateral: Secondary | ICD-10-CM | POA: Diagnosis not present

## 2014-06-15 DIAGNOSIS — H5203 Hypermetropia, bilateral: Secondary | ICD-10-CM | POA: Diagnosis not present

## 2014-06-15 DIAGNOSIS — H40013 Open angle with borderline findings, low risk, bilateral: Secondary | ICD-10-CM | POA: Diagnosis not present

## 2014-06-15 DIAGNOSIS — H16223 Keratoconjunctivitis sicca, not specified as Sjogren's, bilateral: Secondary | ICD-10-CM | POA: Diagnosis not present

## 2014-06-22 DIAGNOSIS — J449 Chronic obstructive pulmonary disease, unspecified: Secondary | ICD-10-CM | POA: Diagnosis not present

## 2014-06-25 DIAGNOSIS — Z79899 Other long term (current) drug therapy: Secondary | ICD-10-CM | POA: Diagnosis not present

## 2014-06-25 DIAGNOSIS — I10 Essential (primary) hypertension: Secondary | ICD-10-CM | POA: Diagnosis not present

## 2014-06-25 DIAGNOSIS — S63502A Unspecified sprain of left wrist, initial encounter: Secondary | ICD-10-CM | POA: Diagnosis not present

## 2014-06-25 DIAGNOSIS — M25532 Pain in left wrist: Secondary | ICD-10-CM | POA: Diagnosis not present

## 2014-06-25 DIAGNOSIS — S6992XA Unspecified injury of left wrist, hand and finger(s), initial encounter: Secondary | ICD-10-CM | POA: Diagnosis not present

## 2014-06-25 DIAGNOSIS — W19XXXA Unspecified fall, initial encounter: Secondary | ICD-10-CM | POA: Diagnosis not present

## 2014-06-25 DIAGNOSIS — M79642 Pain in left hand: Secondary | ICD-10-CM | POA: Diagnosis not present

## 2014-06-25 DIAGNOSIS — Z86711 Personal history of pulmonary embolism: Secondary | ICD-10-CM | POA: Diagnosis not present

## 2014-06-25 DIAGNOSIS — Z7901 Long term (current) use of anticoagulants: Secondary | ICD-10-CM | POA: Diagnosis not present

## 2014-06-25 DIAGNOSIS — Y998 Other external cause status: Secondary | ICD-10-CM | POA: Diagnosis not present

## 2014-06-25 DIAGNOSIS — J449 Chronic obstructive pulmonary disease, unspecified: Secondary | ICD-10-CM | POA: Diagnosis not present

## 2014-06-28 DIAGNOSIS — Z1389 Encounter for screening for other disorder: Secondary | ICD-10-CM | POA: Diagnosis not present

## 2014-06-28 DIAGNOSIS — Z0000 Encounter for general adult medical examination without abnormal findings: Secondary | ICD-10-CM | POA: Diagnosis not present

## 2014-06-28 DIAGNOSIS — I10 Essential (primary) hypertension: Secondary | ICD-10-CM | POA: Diagnosis not present

## 2014-07-05 DIAGNOSIS — M9902 Segmental and somatic dysfunction of thoracic region: Secondary | ICD-10-CM | POA: Diagnosis not present

## 2014-07-05 DIAGNOSIS — M9901 Segmental and somatic dysfunction of cervical region: Secondary | ICD-10-CM | POA: Diagnosis not present

## 2014-07-05 DIAGNOSIS — M546 Pain in thoracic spine: Secondary | ICD-10-CM | POA: Diagnosis not present

## 2014-07-05 DIAGNOSIS — M5032 Other cervical disc degeneration, mid-cervical region: Secondary | ICD-10-CM | POA: Diagnosis not present

## 2014-07-08 DIAGNOSIS — Z7901 Long term (current) use of anticoagulants: Secondary | ICD-10-CM | POA: Diagnosis not present

## 2014-07-12 DIAGNOSIS — M9901 Segmental and somatic dysfunction of cervical region: Secondary | ICD-10-CM | POA: Diagnosis not present

## 2014-07-12 DIAGNOSIS — M546 Pain in thoracic spine: Secondary | ICD-10-CM | POA: Diagnosis not present

## 2014-07-12 DIAGNOSIS — M5032 Other cervical disc degeneration, mid-cervical region: Secondary | ICD-10-CM | POA: Diagnosis not present

## 2014-07-12 DIAGNOSIS — M9902 Segmental and somatic dysfunction of thoracic region: Secondary | ICD-10-CM | POA: Diagnosis not present

## 2014-07-18 DIAGNOSIS — Z23 Encounter for immunization: Secondary | ICD-10-CM | POA: Diagnosis not present

## 2014-07-23 DIAGNOSIS — Z7901 Long term (current) use of anticoagulants: Secondary | ICD-10-CM | POA: Diagnosis not present

## 2014-07-26 DIAGNOSIS — M546 Pain in thoracic spine: Secondary | ICD-10-CM | POA: Diagnosis not present

## 2014-07-26 DIAGNOSIS — M9902 Segmental and somatic dysfunction of thoracic region: Secondary | ICD-10-CM | POA: Diagnosis not present

## 2014-07-26 DIAGNOSIS — M5032 Other cervical disc degeneration, mid-cervical region: Secondary | ICD-10-CM | POA: Diagnosis not present

## 2014-07-26 DIAGNOSIS — M9901 Segmental and somatic dysfunction of cervical region: Secondary | ICD-10-CM | POA: Diagnosis not present

## 2014-08-04 DIAGNOSIS — Z7901 Long term (current) use of anticoagulants: Secondary | ICD-10-CM | POA: Diagnosis not present

## 2014-08-16 DIAGNOSIS — Z1231 Encounter for screening mammogram for malignant neoplasm of breast: Secondary | ICD-10-CM | POA: Diagnosis not present

## 2014-08-17 DIAGNOSIS — Z01419 Encounter for gynecological examination (general) (routine) without abnormal findings: Secondary | ICD-10-CM | POA: Diagnosis not present

## 2014-08-26 DIAGNOSIS — Z131 Encounter for screening for diabetes mellitus: Secondary | ICD-10-CM | POA: Diagnosis not present

## 2014-08-26 DIAGNOSIS — J45902 Unspecified asthma with status asthmaticus: Secondary | ICD-10-CM | POA: Diagnosis not present

## 2014-08-26 DIAGNOSIS — Z7901 Long term (current) use of anticoagulants: Secondary | ICD-10-CM | POA: Diagnosis not present

## 2014-08-26 DIAGNOSIS — I10 Essential (primary) hypertension: Secondary | ICD-10-CM | POA: Diagnosis not present

## 2014-09-04 DIAGNOSIS — Z79899 Other long term (current) drug therapy: Secondary | ICD-10-CM | POA: Diagnosis not present

## 2014-09-08 DIAGNOSIS — Z7901 Long term (current) use of anticoagulants: Secondary | ICD-10-CM | POA: Diagnosis not present

## 2014-09-17 ENCOUNTER — Other Ambulatory Visit: Payer: Self-pay | Admitting: Cardiovascular Disease

## 2014-09-21 DIAGNOSIS — M9901 Segmental and somatic dysfunction of cervical region: Secondary | ICD-10-CM | POA: Diagnosis not present

## 2014-09-21 DIAGNOSIS — M5032 Other cervical disc degeneration, mid-cervical region: Secondary | ICD-10-CM | POA: Diagnosis not present

## 2014-09-21 DIAGNOSIS — M546 Pain in thoracic spine: Secondary | ICD-10-CM | POA: Diagnosis not present

## 2014-09-21 DIAGNOSIS — M9902 Segmental and somatic dysfunction of thoracic region: Secondary | ICD-10-CM | POA: Diagnosis not present

## 2014-09-23 ENCOUNTER — Other Ambulatory Visit: Payer: Self-pay | Admitting: Cardiovascular Disease

## 2014-09-23 DIAGNOSIS — Z7901 Long term (current) use of anticoagulants: Secondary | ICD-10-CM | POA: Diagnosis not present

## 2014-10-04 DIAGNOSIS — M5032 Other cervical disc degeneration, mid-cervical region: Secondary | ICD-10-CM | POA: Diagnosis not present

## 2014-10-04 DIAGNOSIS — M9902 Segmental and somatic dysfunction of thoracic region: Secondary | ICD-10-CM | POA: Diagnosis not present

## 2014-10-04 DIAGNOSIS — M546 Pain in thoracic spine: Secondary | ICD-10-CM | POA: Diagnosis not present

## 2014-10-04 DIAGNOSIS — M9901 Segmental and somatic dysfunction of cervical region: Secondary | ICD-10-CM | POA: Diagnosis not present

## 2014-10-08 DIAGNOSIS — J45909 Unspecified asthma, uncomplicated: Secondary | ICD-10-CM | POA: Diagnosis not present

## 2014-10-08 DIAGNOSIS — M109 Gout, unspecified: Secondary | ICD-10-CM | POA: Diagnosis not present

## 2014-10-08 DIAGNOSIS — Z7901 Long term (current) use of anticoagulants: Secondary | ICD-10-CM | POA: Diagnosis not present

## 2014-10-08 DIAGNOSIS — Z7951 Long term (current) use of inhaled steroids: Secondary | ICD-10-CM | POA: Diagnosis not present

## 2014-10-08 DIAGNOSIS — Z7952 Long term (current) use of systemic steroids: Secondary | ICD-10-CM | POA: Diagnosis not present

## 2014-10-08 DIAGNOSIS — F329 Major depressive disorder, single episode, unspecified: Secondary | ICD-10-CM | POA: Diagnosis not present

## 2014-10-08 DIAGNOSIS — Z86711 Personal history of pulmonary embolism: Secondary | ICD-10-CM | POA: Diagnosis not present

## 2014-10-08 DIAGNOSIS — N959 Unspecified menopausal and perimenopausal disorder: Secondary | ICD-10-CM | POA: Diagnosis not present

## 2014-10-08 DIAGNOSIS — Z885 Allergy status to narcotic agent status: Secondary | ICD-10-CM | POA: Diagnosis not present

## 2014-10-08 DIAGNOSIS — R079 Chest pain, unspecified: Secondary | ICD-10-CM | POA: Diagnosis not present

## 2014-10-08 DIAGNOSIS — R0789 Other chest pain: Secondary | ICD-10-CM | POA: Diagnosis not present

## 2014-10-08 DIAGNOSIS — I10 Essential (primary) hypertension: Secondary | ICD-10-CM | POA: Diagnosis not present

## 2014-10-08 DIAGNOSIS — M797 Fibromyalgia: Secondary | ICD-10-CM | POA: Diagnosis not present

## 2014-10-09 DIAGNOSIS — M109 Gout, unspecified: Secondary | ICD-10-CM | POA: Diagnosis not present

## 2014-10-09 DIAGNOSIS — F329 Major depressive disorder, single episode, unspecified: Secondary | ICD-10-CM | POA: Diagnosis not present

## 2014-10-09 DIAGNOSIS — I10 Essential (primary) hypertension: Secondary | ICD-10-CM | POA: Diagnosis not present

## 2014-10-09 DIAGNOSIS — R079 Chest pain, unspecified: Secondary | ICD-10-CM | POA: Diagnosis not present

## 2014-10-09 DIAGNOSIS — J45909 Unspecified asthma, uncomplicated: Secondary | ICD-10-CM | POA: Diagnosis not present

## 2014-10-19 DIAGNOSIS — J4520 Mild intermittent asthma, uncomplicated: Secondary | ICD-10-CM | POA: Diagnosis not present

## 2014-10-19 DIAGNOSIS — I209 Angina pectoris, unspecified: Secondary | ICD-10-CM | POA: Diagnosis not present

## 2014-10-20 DIAGNOSIS — Z7901 Long term (current) use of anticoagulants: Secondary | ICD-10-CM | POA: Diagnosis not present

## 2014-11-04 DIAGNOSIS — Z7901 Long term (current) use of anticoagulants: Secondary | ICD-10-CM | POA: Diagnosis not present

## 2014-11-05 ENCOUNTER — Other Ambulatory Visit: Payer: Self-pay | Admitting: Cardiovascular Disease

## 2014-11-17 DIAGNOSIS — Z7901 Long term (current) use of anticoagulants: Secondary | ICD-10-CM | POA: Diagnosis not present

## 2014-11-30 DIAGNOSIS — N761 Subacute and chronic vaginitis: Secondary | ICD-10-CM | POA: Diagnosis not present

## 2014-12-01 DIAGNOSIS — M797 Fibromyalgia: Secondary | ICD-10-CM | POA: Diagnosis not present

## 2014-12-01 DIAGNOSIS — Z7901 Long term (current) use of anticoagulants: Secondary | ICD-10-CM | POA: Diagnosis not present

## 2014-12-01 DIAGNOSIS — Z86711 Personal history of pulmonary embolism: Secondary | ICD-10-CM | POA: Diagnosis not present

## 2014-12-01 DIAGNOSIS — R531 Weakness: Secondary | ICD-10-CM | POA: Diagnosis not present

## 2014-12-01 DIAGNOSIS — E039 Hypothyroidism, unspecified: Secondary | ICD-10-CM | POA: Diagnosis not present

## 2014-12-01 DIAGNOSIS — R51 Headache: Secondary | ICD-10-CM | POA: Diagnosis not present

## 2014-12-01 DIAGNOSIS — Z8249 Family history of ischemic heart disease and other diseases of the circulatory system: Secondary | ICD-10-CM | POA: Diagnosis not present

## 2014-12-01 DIAGNOSIS — R404 Transient alteration of awareness: Secondary | ICD-10-CM | POA: Diagnosis not present

## 2014-12-01 DIAGNOSIS — I10 Essential (primary) hypertension: Secondary | ICD-10-CM | POA: Diagnosis not present

## 2014-12-01 DIAGNOSIS — J449 Chronic obstructive pulmonary disease, unspecified: Secondary | ICD-10-CM | POA: Diagnosis not present

## 2014-12-01 DIAGNOSIS — Z79899 Other long term (current) drug therapy: Secondary | ICD-10-CM | POA: Diagnosis not present

## 2014-12-01 DIAGNOSIS — R42 Dizziness and giddiness: Secondary | ICD-10-CM | POA: Diagnosis not present

## 2014-12-01 DIAGNOSIS — J45909 Unspecified asthma, uncomplicated: Secondary | ICD-10-CM | POA: Diagnosis not present

## 2014-12-02 DIAGNOSIS — R001 Bradycardia, unspecified: Secondary | ICD-10-CM | POA: Diagnosis not present

## 2014-12-02 DIAGNOSIS — J309 Allergic rhinitis, unspecified: Secondary | ICD-10-CM | POA: Diagnosis not present

## 2014-12-07 DIAGNOSIS — Z7901 Long term (current) use of anticoagulants: Secondary | ICD-10-CM | POA: Diagnosis not present

## 2014-12-15 DIAGNOSIS — K921 Melena: Secondary | ICD-10-CM | POA: Diagnosis not present

## 2014-12-21 DIAGNOSIS — K921 Melena: Secondary | ICD-10-CM | POA: Diagnosis not present

## 2014-12-21 DIAGNOSIS — F329 Major depressive disorder, single episode, unspecified: Secondary | ICD-10-CM | POA: Diagnosis not present

## 2014-12-21 DIAGNOSIS — Z811 Family history of alcohol abuse and dependence: Secondary | ICD-10-CM | POA: Diagnosis not present

## 2014-12-21 DIAGNOSIS — Z8489 Family history of other specified conditions: Secondary | ICD-10-CM | POA: Diagnosis not present

## 2014-12-21 DIAGNOSIS — Z885 Allergy status to narcotic agent status: Secondary | ICD-10-CM | POA: Diagnosis not present

## 2014-12-21 DIAGNOSIS — Z7901 Long term (current) use of anticoagulants: Secondary | ICD-10-CM | POA: Diagnosis not present

## 2014-12-21 DIAGNOSIS — Z79899 Other long term (current) drug therapy: Secondary | ICD-10-CM | POA: Diagnosis not present

## 2014-12-21 DIAGNOSIS — M797 Fibromyalgia: Secondary | ICD-10-CM | POA: Diagnosis not present

## 2014-12-21 DIAGNOSIS — J45909 Unspecified asthma, uncomplicated: Secondary | ICD-10-CM | POA: Diagnosis not present

## 2014-12-21 DIAGNOSIS — Z86711 Personal history of pulmonary embolism: Secondary | ICD-10-CM | POA: Diagnosis not present

## 2014-12-21 DIAGNOSIS — Z8371 Family history of colonic polyps: Secondary | ICD-10-CM | POA: Diagnosis not present

## 2014-12-21 DIAGNOSIS — Z888 Allergy status to other drugs, medicaments and biological substances status: Secondary | ICD-10-CM | POA: Diagnosis not present

## 2014-12-21 DIAGNOSIS — Z8249 Family history of ischemic heart disease and other diseases of the circulatory system: Secondary | ICD-10-CM | POA: Diagnosis not present

## 2014-12-21 DIAGNOSIS — Z7951 Long term (current) use of inhaled steroids: Secondary | ICD-10-CM | POA: Diagnosis not present

## 2014-12-22 DIAGNOSIS — Z7901 Long term (current) use of anticoagulants: Secondary | ICD-10-CM | POA: Diagnosis not present

## 2014-12-27 DIAGNOSIS — M5032 Other cervical disc degeneration, mid-cervical region: Secondary | ICD-10-CM | POA: Diagnosis not present

## 2014-12-27 DIAGNOSIS — M546 Pain in thoracic spine: Secondary | ICD-10-CM | POA: Diagnosis not present

## 2014-12-27 DIAGNOSIS — M9901 Segmental and somatic dysfunction of cervical region: Secondary | ICD-10-CM | POA: Diagnosis not present

## 2014-12-27 DIAGNOSIS — M9902 Segmental and somatic dysfunction of thoracic region: Secondary | ICD-10-CM | POA: Diagnosis not present

## 2015-01-05 DIAGNOSIS — K625 Hemorrhage of anus and rectum: Secondary | ICD-10-CM | POA: Diagnosis not present

## 2015-01-06 DIAGNOSIS — G4701 Insomnia due to medical condition: Secondary | ICD-10-CM | POA: Diagnosis not present

## 2015-01-06 DIAGNOSIS — M25562 Pain in left knee: Secondary | ICD-10-CM | POA: Diagnosis not present

## 2015-01-06 DIAGNOSIS — M797 Fibromyalgia: Secondary | ICD-10-CM | POA: Diagnosis not present

## 2015-01-06 DIAGNOSIS — R5383 Other fatigue: Secondary | ICD-10-CM | POA: Diagnosis not present

## 2015-01-06 DIAGNOSIS — M79641 Pain in right hand: Secondary | ICD-10-CM | POA: Diagnosis not present

## 2015-01-07 DIAGNOSIS — Z7901 Long term (current) use of anticoagulants: Secondary | ICD-10-CM | POA: Diagnosis not present

## 2015-01-27 DIAGNOSIS — Z7901 Long term (current) use of anticoagulants: Secondary | ICD-10-CM | POA: Diagnosis not present

## 2015-02-07 DIAGNOSIS — J309 Allergic rhinitis, unspecified: Secondary | ICD-10-CM | POA: Diagnosis not present

## 2015-02-09 DIAGNOSIS — J449 Chronic obstructive pulmonary disease, unspecified: Secondary | ICD-10-CM | POA: Diagnosis not present

## 2015-02-09 DIAGNOSIS — Z7901 Long term (current) use of anticoagulants: Secondary | ICD-10-CM | POA: Diagnosis not present

## 2015-02-24 DIAGNOSIS — Z7901 Long term (current) use of anticoagulants: Secondary | ICD-10-CM | POA: Diagnosis not present

## 2015-03-10 DIAGNOSIS — Z7901 Long term (current) use of anticoagulants: Secondary | ICD-10-CM | POA: Diagnosis not present

## 2015-03-11 DIAGNOSIS — Z79899 Other long term (current) drug therapy: Secondary | ICD-10-CM | POA: Diagnosis not present

## 2015-03-21 DIAGNOSIS — M9901 Segmental and somatic dysfunction of cervical region: Secondary | ICD-10-CM | POA: Diagnosis not present

## 2015-03-21 DIAGNOSIS — M546 Pain in thoracic spine: Secondary | ICD-10-CM | POA: Diagnosis not present

## 2015-03-21 DIAGNOSIS — M9902 Segmental and somatic dysfunction of thoracic region: Secondary | ICD-10-CM | POA: Diagnosis not present

## 2015-03-21 DIAGNOSIS — M5032 Other cervical disc degeneration, mid-cervical region: Secondary | ICD-10-CM | POA: Diagnosis not present

## 2015-03-24 DIAGNOSIS — Z7901 Long term (current) use of anticoagulants: Secondary | ICD-10-CM | POA: Diagnosis not present

## 2015-04-06 DIAGNOSIS — Z7901 Long term (current) use of anticoagulants: Secondary | ICD-10-CM | POA: Diagnosis not present

## 2015-04-17 IMAGING — CT CT CHEST W/O CM
2 of 3 series · 15 of 36 positions shown, 18 images · non-contrast
Comparison: Plain films earlier today.  07/15/2012 chest CT.

CLINICAL DATA: Trauma with left rib and upper back pain. Normal
radiographs.

EXAM:
CT CHEST WITHOUT CONTRAST
TECHNIQUE: Multidetector CT imaging of the chest was performed following the
standard protocol without IV contrast.

[Series 2: chestroutine 5.0 b40f · axial · 0.63mm/px · z∈[-309,-59]mm · 12 of 60 slices shown, 15 images]
[im 5/60  mediastinal]
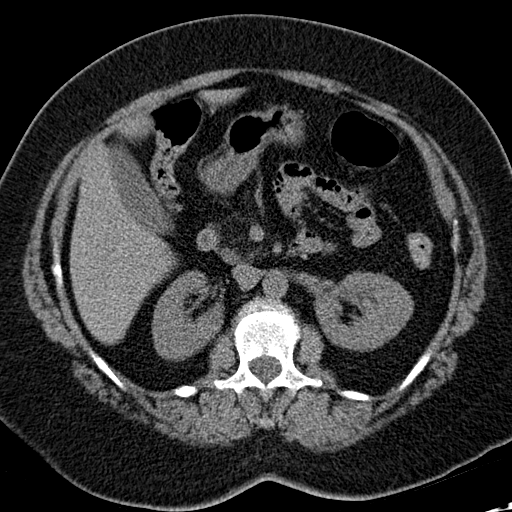
[im 5/60  lung]
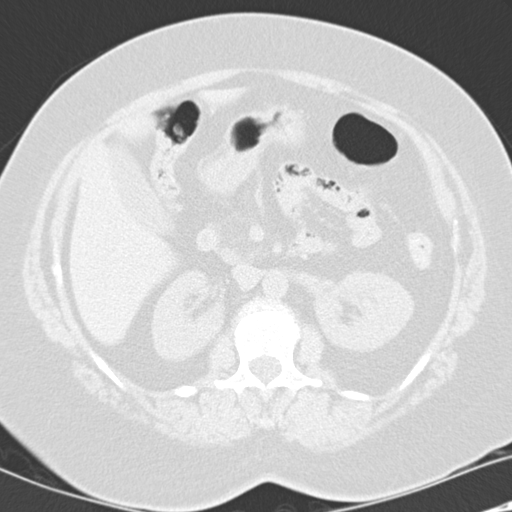
[im 9/60  lung]
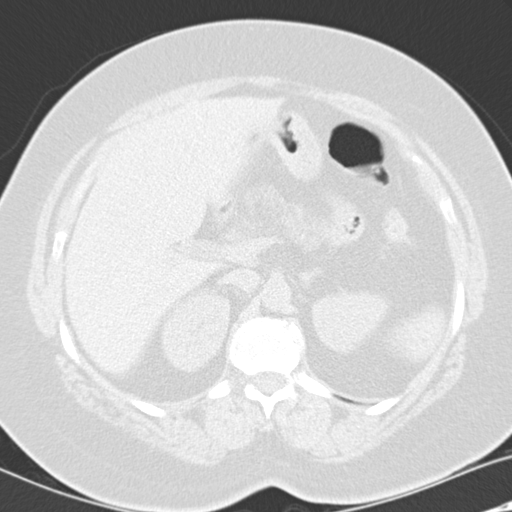
[im 14/60  lung]
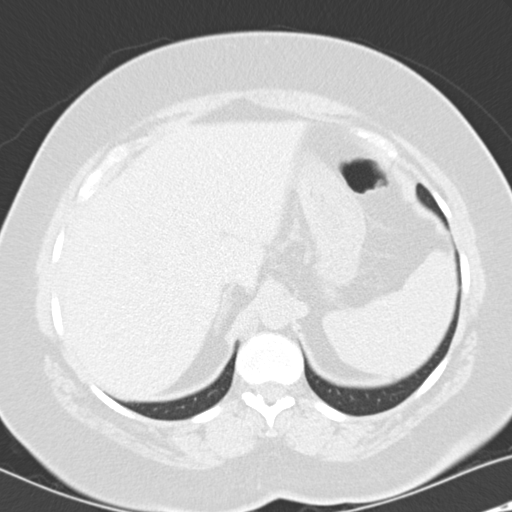
[im 18/60  lung]
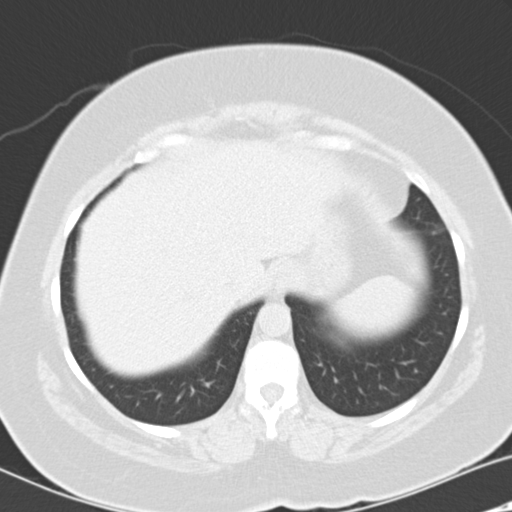
[im 22/60  mediastinal]
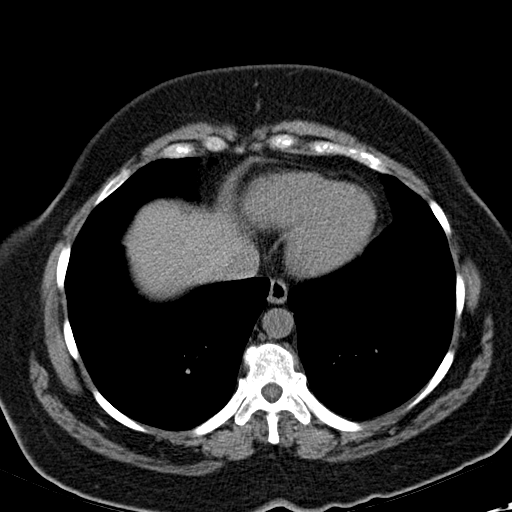
[im 22/60  lung]
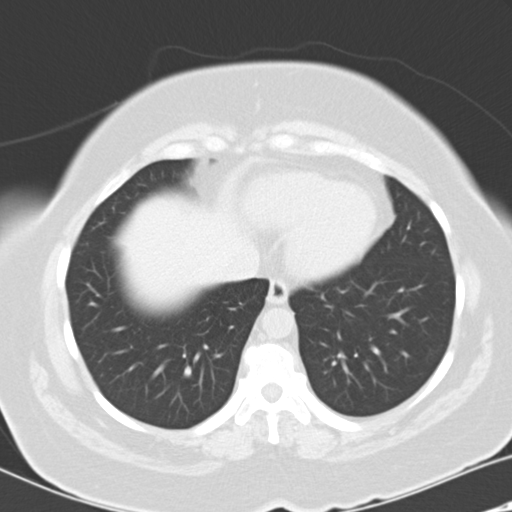
[im 27/60  lung]
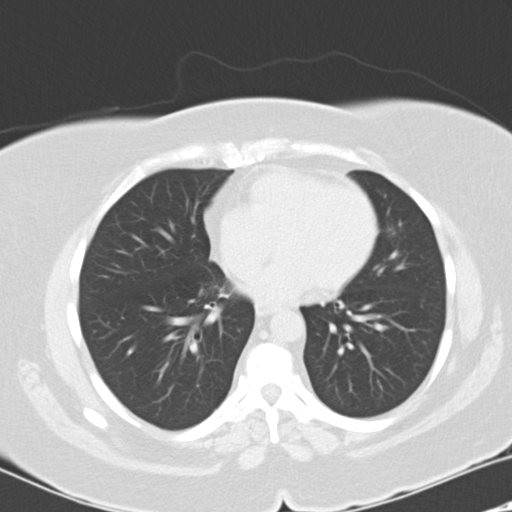
[im 33/60  lung]
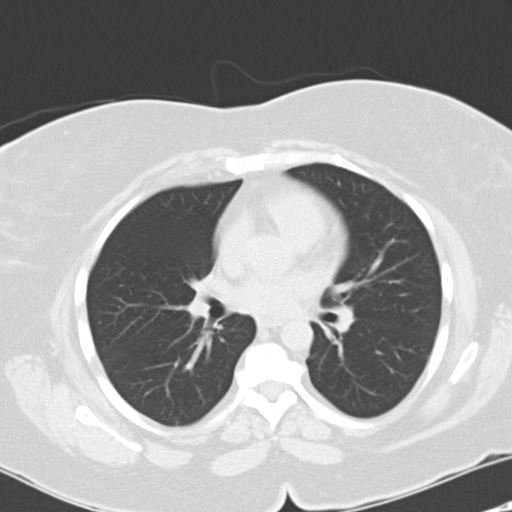
[im 38/60  lung]
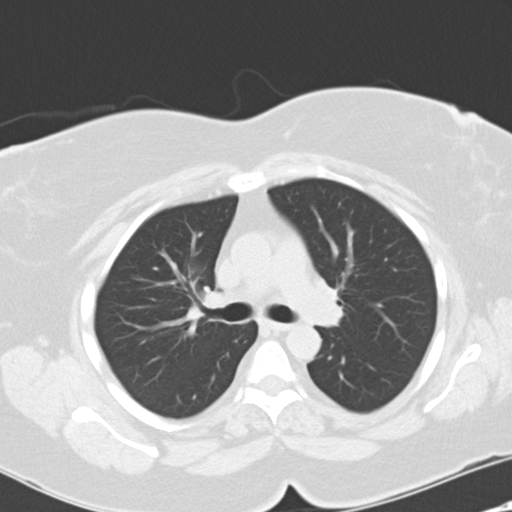
[im 42/60  mediastinal]
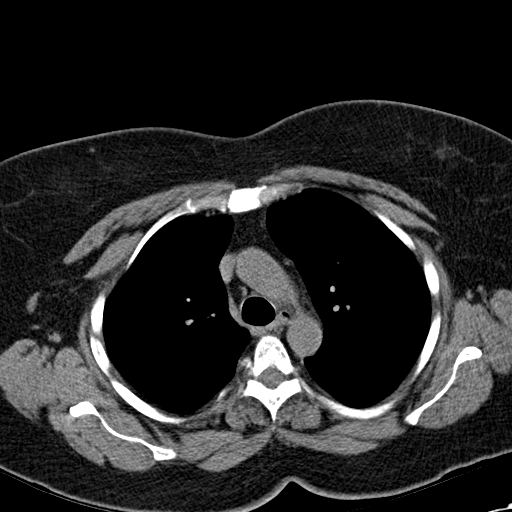
[im 42/60  lung]
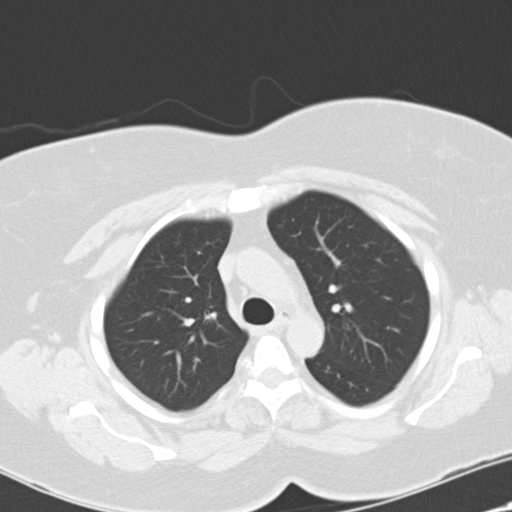
[im 46/60  lung]
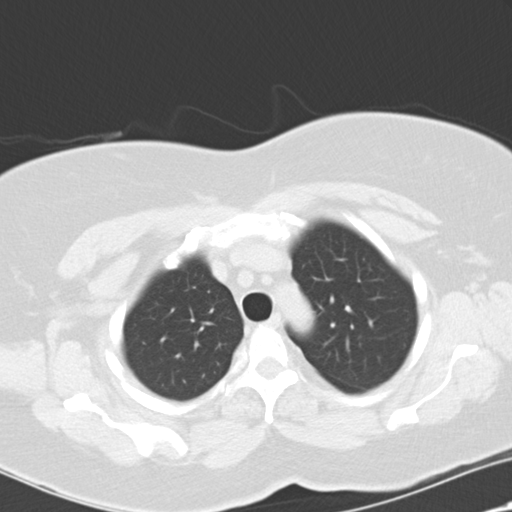
[im 51/60  lung]
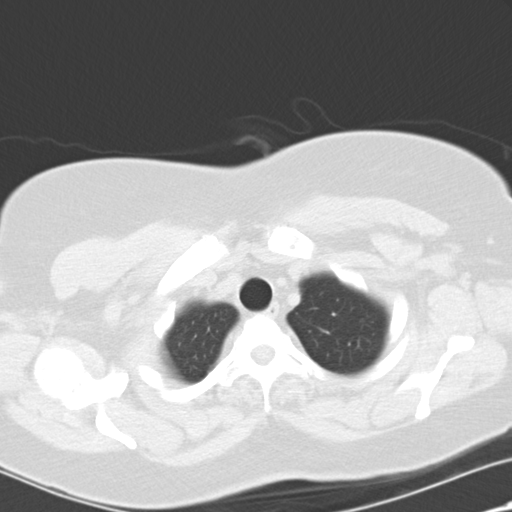
[im 55/60  lung]
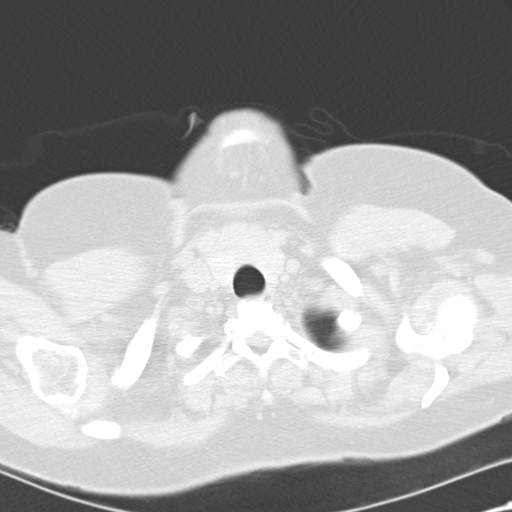

[Series 4: mpr coro 3mm · coronal · 0.61mm/px · 3 of 96 slices shown]
[im 20/96  lung]
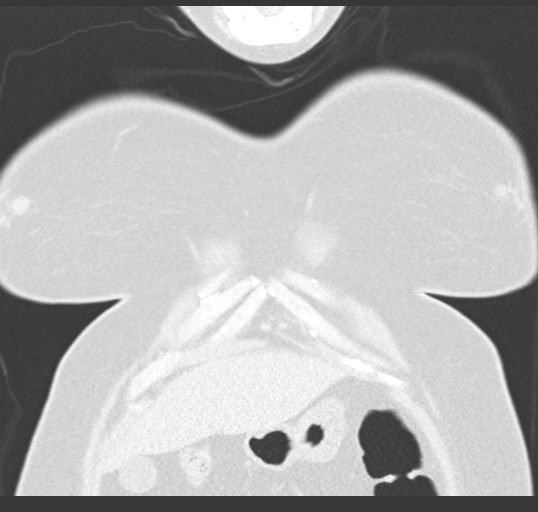
[im 39/96  lung]
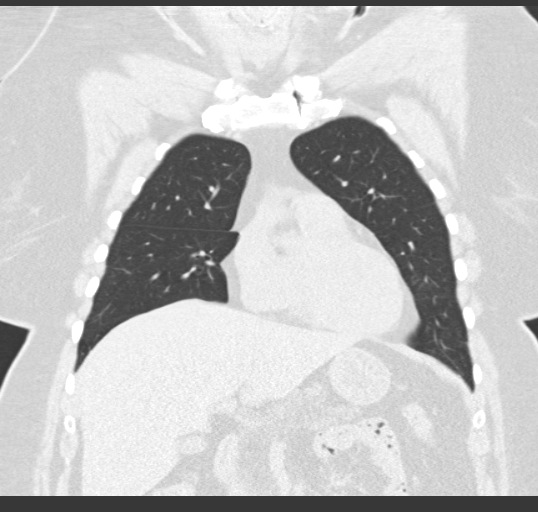
[im 58/96  lung]
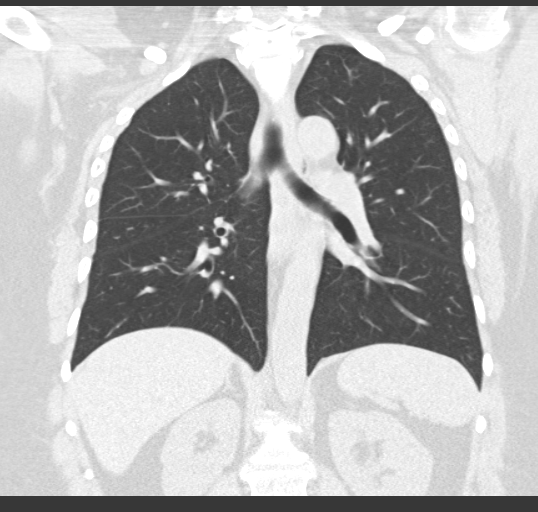

[15 of 36 positions shown; findings below may reference images not displayed]

FINDINGS: Lungs/Pleura: No nodules or airspace opacities. No pneumothorax. No
pleural fluid.

Heart/Mediastinum: Normal aortic caliber. No mediastinal hematoma.
Normal heart size, without pericardial effusion. No mediastinal or
definite hilar adenopathy, given limitations of unenhanced CT.

Upper Abdomen: Fatty atrophy throughout the pancreas. No free
intraperitoneal air.

Bones/Musculoskeletal:  No soft tissue hematoma.

No rib fracture.
IMPRESSION: No acute or posttraumatic deformity identified within the chest.

## 2015-04-21 DIAGNOSIS — Z7901 Long term (current) use of anticoagulants: Secondary | ICD-10-CM | POA: Diagnosis not present

## 2015-05-03 ENCOUNTER — Other Ambulatory Visit: Payer: Self-pay | Admitting: Cardiovascular Disease

## 2015-05-10 DIAGNOSIS — I10 Essential (primary) hypertension: Secondary | ICD-10-CM | POA: Diagnosis not present

## 2015-05-10 DIAGNOSIS — J309 Allergic rhinitis, unspecified: Secondary | ICD-10-CM | POA: Diagnosis not present

## 2015-05-10 DIAGNOSIS — I5030 Unspecified diastolic (congestive) heart failure: Secondary | ICD-10-CM | POA: Diagnosis not present

## 2015-05-10 DIAGNOSIS — I2782 Chronic pulmonary embolism: Secondary | ICD-10-CM | POA: Diagnosis not present

## 2015-05-10 DIAGNOSIS — J4521 Mild intermittent asthma with (acute) exacerbation: Secondary | ICD-10-CM | POA: Diagnosis not present

## 2015-05-13 DIAGNOSIS — I2782 Chronic pulmonary embolism: Secondary | ICD-10-CM | POA: Diagnosis not present

## 2015-05-13 DIAGNOSIS — Z7901 Long term (current) use of anticoagulants: Secondary | ICD-10-CM | POA: Diagnosis not present

## 2015-05-27 DIAGNOSIS — I2782 Chronic pulmonary embolism: Secondary | ICD-10-CM | POA: Diagnosis not present

## 2015-05-27 DIAGNOSIS — Z7901 Long term (current) use of anticoagulants: Secondary | ICD-10-CM | POA: Diagnosis not present

## 2015-05-30 ENCOUNTER — Ambulatory Visit (INDEPENDENT_AMBULATORY_CARE_PROVIDER_SITE_OTHER): Payer: Medicare Other | Admitting: Cardiovascular Disease

## 2015-05-30 ENCOUNTER — Encounter: Payer: Self-pay | Admitting: *Deleted

## 2015-05-30 ENCOUNTER — Encounter: Payer: Self-pay | Admitting: Cardiovascular Disease

## 2015-05-30 VITALS — BP 108/68 | HR 60 | Ht 61.0 in | Wt 190.0 lb

## 2015-05-30 DIAGNOSIS — I5032 Chronic diastolic (congestive) heart failure: Secondary | ICD-10-CM | POA: Diagnosis not present

## 2015-05-30 DIAGNOSIS — I10 Essential (primary) hypertension: Secondary | ICD-10-CM | POA: Diagnosis not present

## 2015-05-30 DIAGNOSIS — I2699 Other pulmonary embolism without acute cor pulmonale: Secondary | ICD-10-CM | POA: Diagnosis not present

## 2015-05-30 DIAGNOSIS — I471 Supraventricular tachycardia: Secondary | ICD-10-CM

## 2015-05-30 DIAGNOSIS — R002 Palpitations: Secondary | ICD-10-CM | POA: Diagnosis not present

## 2015-05-30 NOTE — Patient Instructions (Signed)

## 2015-05-30 NOTE — Progress Notes (Signed)
Patient ID: OTILLIA CORDONE, female   DOB: [birth year], 64 y.o.   MRN: [id number]      SUBJECTIVE: Mrs. Franca presents for follow up of palpitations and sinus tachycardia. She has a history of PSVT, HTN, chronic diastolic heart failure, COPD, and pulmonary embolism.  She was evaluated in the ED on 12/01/14 for weakness and dizziness. Heart rate was 48 bpm. She was given IV fluids and felt better. I reviewed all relevant documentation and blood tests from this hospitalization. Troponins and BNP were normal. Hemoglobin, CBC, TSH, and renal function were all unremarkable. She underwent a normal head CT. ECG demonstrated sinus bradycardia, heart rate 54 bpm.  Diltiazem dose was reduced by her PCP.  She is doing well today. Has issues with anxiety and panic attacks from time to time.  Review of Systems: As per "subjective", otherwise negative.  Allergies  Allergen Reactions  . Adhesive [Tape]   . Codeine     REACTION: vomiting    Current Outpatient Prescriptions  Medication Sig Dispense Refill  . atorvastatin (LIPITOR) 40 MG tablet Take 40 mg by mouth daily.    . clonazePAM (KLONOPIN) 0.5 MG tablet Take 0.5 mg by mouth every morning.    . colchicine 0.6 MG tablet Take 0.6 mg by mouth 2 (two) times daily.    . cycloSPORINE (RESTASIS) 0.05 % ophthalmic emulsion Place 1 drop into both eyes 2 (two) times daily.    Marland Kitchen diltiazem (CARDIZEM) 60 MG tablet Take 1 tablet by mouth daily.    . DULoxetine (CYMBALTA) 30 MG capsule Take 30 mg by mouth 2 (two) times daily.     . fluticasone (FLONASE) 50 MCG/ACT nasal spray Place 1 spray into the nose daily.     . furosemide (LASIX) 40 MG tablet TAKE (1) TABLET BY MOUTH ONCE DAILY. 30 tablet 6  . INCRUSE ELLIPTA 62.5 MCG/INH AEPB Inhale 1 puff into the lungs daily.    Marland Kitchen levalbuterol (XOPENEX HFA) 45 MCG/ACT inhaler Inhale 2 puffs into the lungs every 6 (six) hours as needed for wheezing.    Marland Kitchen levothyroxine (SYNTHROID, LEVOTHROID) 50 MCG tablet Take 50  mcg by mouth daily before breakfast.     . loperamide (IMODIUM A-D) 2 MG tablet Take 2 mg by mouth 4 (four) times daily as needed for diarrhea or loose stools.    . meclizine (ANTIVERT) 25 MG tablet Take 25 mg by mouth 3 (three) times daily as needed.     . metoprolol succinate (TOPROL-XL) 50 MG 24 hr tablet TAKE 1 TABLET ONCE A DAY WITH A MEAL OR IMMEDIATELY FOLLOWING A MEAL. 30 tablet 6  . montelukast (SINGULAIR) 10 MG tablet Take 1 tablet by mouth daily.    Marland Kitchen nystatin (MYCOSTATIN) powder Apply topically as needed.    Marland Kitchen omeprazole (PRILOSEC) 20 MG capsule Take 20 mg by mouth daily.     . pantoprazole (PROTONIX) 40 MG tablet Take 40 mg by mouth daily.    Marland Kitchen PARoxetine (PAXIL) 30 MG tablet Take 30 mg by mouth daily.    . potassium chloride (K-DUR) 10 MEQ tablet Take 2 tablets by mouth daily.    . Simethicone (GAS-X EXTRA STRENGTH) 125 MG CAPS Take 2 capsules by mouth as needed.    . SYMBICORT 160-4.5 MCG/ACT inhaler Inhale 2 puffs into the lungs 2 (two) times daily.    . traMADol (ULTRAM) 50 MG tablet Take 50-100 mg by mouth 2 (two) times daily.    . traZODone (DESYREL) 50 MG tablet Take 50-100 mg  by mouth at bedtime.     Marland Kitchen warfarin (COUMADIN) 5 MG tablet Take 5 mg by mouth daily. Managed by [facility] office      No current facility-administered medications for this visit.    Past Medical History  Diagnosis Date  . SVT (supraventricular tachycardia)   . Chest pain, unspecified   . Congestive heart failure, unspecified   . Depressive disorder, not elsewhere classified   . 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 Unspecified essential hypertension   . Cardiac dysrhythmia, unspecified   . Palpitations   . Chronic airway obstruction, not elsewhere classified   . Obesity, unspecified   . History of rheumatoid arthritis   . Carotid bruit     Bilateral  . Syncope     admitted 09/2011    Past Surgical History  Procedure Laterality Date  . Carpal tunnel release     . Catheter ablation  02/1997    @ [facility]  . Vein surgery bladder      Social History   Social History  . Marital Status: Single    Spouse Name: N/A  . Number of Children: N/A  . Years of Education: N/A   Occupational History  . Not on file.   Social History Main Topics  . Smoking status: Never Smoker   . Smokeless tobacco: Never Used  . Alcohol Use: No  . Drug Use: No  . Sexual Activity: Not on file   Other Topics Concern  . Not on file   Social History Narrative     Filed Vitals:   05/30/15 1126  BP: 108/68  Pulse: 60  Height: 5\' 1"  (1.549 m)  Weight: 190 lb (86.183 kg)    PHYSICAL EXAM General: NAD HEENT: Normal. Neck: No JVD, no thyromegaly. Lungs: Clear to auscultation bilaterally with normal respiratory effort. CV: Nondisplaced PMI.  Regular rate and rhythm, normal S1/S2, no S3/S4, no murmur. No pretibial or periankle edema.  No carotid bruit.   Abdomen: Soft, nontender, obese, no distention.  Neurologic: Alert and oriented x 3.  Psych: Somewhat flat affect. Skin: Normal. Musculoskeletal: No gross deformities. Extremities: No clubbing or cyanosis.   ECG: Most recent ECG reviewed.      ASSESSMENT AND PLAN:  Palpitations/Sinus Tachycardia/History of PSVT Stable on diltiazem and metoprolol. She apparently had a previous RF ablation procedure in 1998, at [facility], which was unsuccessful.   Chronic diastolic heart failure  Euvolemic by history and exam. BP well controlled. No changes.  Pulmonary embolism  On chronic anticoagulation with warfarin, followed by primary M.D.   Essential HTN Controlled. No changes.  Dispo: f/u 1 year.   Kate Sable, M.D., F.A.C.C.

## 2015-05-31 DIAGNOSIS — R0602 Shortness of breath: Secondary | ICD-10-CM | POA: Diagnosis not present

## 2015-05-31 DIAGNOSIS — R069 Unspecified abnormalities of breathing: Secondary | ICD-10-CM | POA: Diagnosis not present

## 2015-05-31 DIAGNOSIS — Z8249 Family history of ischemic heart disease and other diseases of the circulatory system: Secondary | ICD-10-CM | POA: Diagnosis not present

## 2015-05-31 DIAGNOSIS — J449 Chronic obstructive pulmonary disease, unspecified: Secondary | ICD-10-CM | POA: Diagnosis not present

## 2015-05-31 DIAGNOSIS — J45901 Unspecified asthma with (acute) exacerbation: Secondary | ICD-10-CM | POA: Diagnosis not present

## 2015-05-31 DIAGNOSIS — E039 Hypothyroidism, unspecified: Secondary | ICD-10-CM | POA: Diagnosis not present

## 2015-05-31 DIAGNOSIS — Z7901 Long term (current) use of anticoagulants: Secondary | ICD-10-CM | POA: Diagnosis not present

## 2015-05-31 DIAGNOSIS — Z86711 Personal history of pulmonary embolism: Secondary | ICD-10-CM | POA: Diagnosis not present

## 2015-05-31 DIAGNOSIS — J4521 Mild intermittent asthma with (acute) exacerbation: Secondary | ICD-10-CM | POA: Diagnosis not present

## 2015-05-31 DIAGNOSIS — I10 Essential (primary) hypertension: Secondary | ICD-10-CM | POA: Diagnosis not present

## 2015-05-31 DIAGNOSIS — Z79899 Other long term (current) drug therapy: Secondary | ICD-10-CM | POA: Diagnosis not present

## 2015-05-31 DIAGNOSIS — J45909 Unspecified asthma, uncomplicated: Secondary | ICD-10-CM | POA: Diagnosis not present

## 2015-06-07 DIAGNOSIS — I10 Essential (primary) hypertension: Secondary | ICD-10-CM | POA: Diagnosis not present

## 2015-06-07 DIAGNOSIS — J4541 Moderate persistent asthma with (acute) exacerbation: Secondary | ICD-10-CM | POA: Diagnosis not present

## 2015-06-07 DIAGNOSIS — I5032 Chronic diastolic (congestive) heart failure: Secondary | ICD-10-CM | POA: Diagnosis not present

## 2015-06-16 DIAGNOSIS — Z7901 Long term (current) use of anticoagulants: Secondary | ICD-10-CM | POA: Diagnosis not present

## 2015-07-05 DIAGNOSIS — Z7901 Long term (current) use of anticoagulants: Secondary | ICD-10-CM | POA: Diagnosis not present

## 2015-07-05 DIAGNOSIS — I471 Supraventricular tachycardia: Secondary | ICD-10-CM | POA: Diagnosis not present

## 2015-07-05 DIAGNOSIS — I10 Essential (primary) hypertension: Secondary | ICD-10-CM | POA: Diagnosis not present

## 2015-07-07 DIAGNOSIS — M1712 Unilateral primary osteoarthritis, left knee: Secondary | ICD-10-CM | POA: Diagnosis not present

## 2015-07-07 DIAGNOSIS — M797 Fibromyalgia: Secondary | ICD-10-CM | POA: Diagnosis not present

## 2015-07-07 DIAGNOSIS — R5381 Other malaise: Secondary | ICD-10-CM | POA: Diagnosis not present

## 2015-07-07 DIAGNOSIS — G4709 Other insomnia: Secondary | ICD-10-CM | POA: Diagnosis not present

## 2015-07-07 DIAGNOSIS — Z79899 Other long term (current) drug therapy: Secondary | ICD-10-CM | POA: Diagnosis not present

## 2015-07-07 DIAGNOSIS — M1120 Other chondrocalcinosis, unspecified site: Secondary | ICD-10-CM | POA: Diagnosis not present

## 2015-07-20 DIAGNOSIS — Z7901 Long term (current) use of anticoagulants: Secondary | ICD-10-CM | POA: Diagnosis not present

## 2015-07-25 DIAGNOSIS — H2513 Age-related nuclear cataract, bilateral: Secondary | ICD-10-CM | POA: Diagnosis not present

## 2015-07-25 DIAGNOSIS — H524 Presbyopia: Secondary | ICD-10-CM | POA: Diagnosis not present

## 2015-08-06 ENCOUNTER — Other Ambulatory Visit: Payer: Self-pay | Admitting: Cardiovascular Disease

## 2015-08-09 DIAGNOSIS — Z1389 Encounter for screening for other disorder: Secondary | ICD-10-CM | POA: Diagnosis not present

## 2015-08-09 DIAGNOSIS — I10 Essential (primary) hypertension: Secondary | ICD-10-CM | POA: Diagnosis not present

## 2015-08-09 DIAGNOSIS — J4541 Moderate persistent asthma with (acute) exacerbation: Secondary | ICD-10-CM | POA: Diagnosis not present

## 2015-08-09 DIAGNOSIS — Z0000 Encounter for general adult medical examination without abnormal findings: Secondary | ICD-10-CM | POA: Diagnosis not present

## 2015-08-10 DIAGNOSIS — Z7901 Long term (current) use of anticoagulants: Secondary | ICD-10-CM | POA: Diagnosis not present

## 2015-08-14 DIAGNOSIS — W06XXXA Fall from bed, initial encounter: Secondary | ICD-10-CM | POA: Diagnosis not present

## 2015-08-14 DIAGNOSIS — S299XXA Unspecified injury of thorax, initial encounter: Secondary | ICD-10-CM | POA: Diagnosis not present

## 2015-08-14 DIAGNOSIS — J449 Chronic obstructive pulmonary disease, unspecified: Secondary | ICD-10-CM | POA: Diagnosis not present

## 2015-08-14 DIAGNOSIS — J45909 Unspecified asthma, uncomplicated: Secondary | ICD-10-CM | POA: Diagnosis not present

## 2015-08-14 DIAGNOSIS — S098XXA Other specified injuries of head, initial encounter: Secondary | ICD-10-CM | POA: Diagnosis not present

## 2015-08-14 DIAGNOSIS — Z86711 Personal history of pulmonary embolism: Secondary | ICD-10-CM | POA: Diagnosis not present

## 2015-08-14 DIAGNOSIS — R51 Headache: Secondary | ICD-10-CM | POA: Diagnosis not present

## 2015-08-14 DIAGNOSIS — S161XXA Strain of muscle, fascia and tendon at neck level, initial encounter: Secondary | ICD-10-CM | POA: Diagnosis not present

## 2015-08-14 DIAGNOSIS — R52 Pain, unspecified: Secondary | ICD-10-CM | POA: Diagnosis not present

## 2015-08-14 DIAGNOSIS — Z7951 Long term (current) use of inhaled steroids: Secondary | ICD-10-CM | POA: Diagnosis not present

## 2015-08-14 DIAGNOSIS — I10 Essential (primary) hypertension: Secondary | ICD-10-CM | POA: Diagnosis not present

## 2015-08-14 DIAGNOSIS — Z79899 Other long term (current) drug therapy: Secondary | ICD-10-CM | POA: Diagnosis not present

## 2015-08-14 DIAGNOSIS — Z7901 Long term (current) use of anticoagulants: Secondary | ICD-10-CM | POA: Diagnosis not present

## 2015-08-18 DIAGNOSIS — M546 Pain in thoracic spine: Secondary | ICD-10-CM | POA: Diagnosis not present

## 2015-08-18 DIAGNOSIS — M9901 Segmental and somatic dysfunction of cervical region: Secondary | ICD-10-CM | POA: Diagnosis not present

## 2015-08-18 DIAGNOSIS — M9902 Segmental and somatic dysfunction of thoracic region: Secondary | ICD-10-CM | POA: Diagnosis not present

## 2015-08-18 DIAGNOSIS — M542 Cervicalgia: Secondary | ICD-10-CM | POA: Diagnosis not present

## 2015-08-19 DIAGNOSIS — S0083XA Contusion of other part of head, initial encounter: Secondary | ICD-10-CM | POA: Diagnosis not present

## 2015-08-19 DIAGNOSIS — I10 Essential (primary) hypertension: Secondary | ICD-10-CM | POA: Diagnosis not present

## 2015-08-23 DIAGNOSIS — Z1231 Encounter for screening mammogram for malignant neoplasm of breast: Secondary | ICD-10-CM | POA: Diagnosis not present

## 2015-08-24 DIAGNOSIS — R008 Other abnormalities of heart beat: Secondary | ICD-10-CM | POA: Diagnosis not present

## 2015-08-24 DIAGNOSIS — T461X1A Poisoning by calcium-channel blockers, accidental (unintentional), initial encounter: Secondary | ICD-10-CM | POA: Diagnosis not present

## 2015-08-24 DIAGNOSIS — R079 Chest pain, unspecified: Secondary | ICD-10-CM | POA: Diagnosis not present

## 2015-08-24 DIAGNOSIS — R531 Weakness: Secondary | ICD-10-CM | POA: Diagnosis not present

## 2015-08-24 DIAGNOSIS — R6889 Other general symptoms and signs: Secondary | ICD-10-CM | POA: Diagnosis not present

## 2015-08-24 DIAGNOSIS — Z7951 Long term (current) use of inhaled steroids: Secondary | ICD-10-CM | POA: Diagnosis not present

## 2015-08-24 DIAGNOSIS — R001 Bradycardia, unspecified: Secondary | ICD-10-CM | POA: Diagnosis not present

## 2015-08-24 DIAGNOSIS — T788XXA Other adverse effects, not elsewhere classified, initial encounter: Secondary | ICD-10-CM | POA: Diagnosis not present

## 2015-08-24 DIAGNOSIS — Z7901 Long term (current) use of anticoagulants: Secondary | ICD-10-CM | POA: Diagnosis not present

## 2015-08-24 DIAGNOSIS — Z79899 Other long term (current) drug therapy: Secondary | ICD-10-CM | POA: Diagnosis not present

## 2015-08-24 DIAGNOSIS — T887XXA Unspecified adverse effect of drug or medicament, initial encounter: Secondary | ICD-10-CM | POA: Diagnosis not present

## 2015-08-25 DIAGNOSIS — R008 Other abnormalities of heart beat: Secondary | ICD-10-CM | POA: Diagnosis not present

## 2015-08-25 DIAGNOSIS — Z79899 Other long term (current) drug therapy: Secondary | ICD-10-CM | POA: Diagnosis not present

## 2015-08-25 DIAGNOSIS — T788XXA Other adverse effects, not elsewhere classified, initial encounter: Secondary | ICD-10-CM | POA: Diagnosis not present

## 2015-08-25 DIAGNOSIS — Z7901 Long term (current) use of anticoagulants: Secondary | ICD-10-CM | POA: Diagnosis not present

## 2015-08-25 DIAGNOSIS — T461X1A Poisoning by calcium-channel blockers, accidental (unintentional), initial encounter: Secondary | ICD-10-CM | POA: Diagnosis not present

## 2015-08-30 DIAGNOSIS — M9902 Segmental and somatic dysfunction of thoracic region: Secondary | ICD-10-CM | POA: Diagnosis not present

## 2015-08-30 DIAGNOSIS — M546 Pain in thoracic spine: Secondary | ICD-10-CM | POA: Diagnosis not present

## 2015-08-30 DIAGNOSIS — M9901 Segmental and somatic dysfunction of cervical region: Secondary | ICD-10-CM | POA: Diagnosis not present

## 2015-08-30 DIAGNOSIS — M542 Cervicalgia: Secondary | ICD-10-CM | POA: Diagnosis not present

## 2015-09-01 DIAGNOSIS — R008 Other abnormalities of heart beat: Secondary | ICD-10-CM | POA: Diagnosis not present

## 2015-09-06 DIAGNOSIS — M546 Pain in thoracic spine: Secondary | ICD-10-CM | POA: Diagnosis not present

## 2015-09-06 DIAGNOSIS — M9901 Segmental and somatic dysfunction of cervical region: Secondary | ICD-10-CM | POA: Diagnosis not present

## 2015-09-06 DIAGNOSIS — M542 Cervicalgia: Secondary | ICD-10-CM | POA: Diagnosis not present

## 2015-09-06 DIAGNOSIS — M9902 Segmental and somatic dysfunction of thoracic region: Secondary | ICD-10-CM | POA: Diagnosis not present

## 2015-09-14 DIAGNOSIS — Z7901 Long term (current) use of anticoagulants: Secondary | ICD-10-CM | POA: Diagnosis not present

## 2015-09-19 ENCOUNTER — Encounter: Payer: Self-pay | Admitting: *Deleted

## 2015-09-21 ENCOUNTER — Ambulatory Visit (INDEPENDENT_AMBULATORY_CARE_PROVIDER_SITE_OTHER): Payer: Medicare Other | Admitting: Cardiovascular Disease

## 2015-09-21 ENCOUNTER — Encounter: Payer: Self-pay | Admitting: Cardiovascular Disease

## 2015-09-21 VITALS — BP 138/90 | HR 65 | Ht 61.0 in | Wt 192.0 lb

## 2015-09-21 DIAGNOSIS — I5032 Chronic diastolic (congestive) heart failure: Secondary | ICD-10-CM

## 2015-09-21 DIAGNOSIS — R002 Palpitations: Secondary | ICD-10-CM

## 2015-09-21 DIAGNOSIS — I2782 Chronic pulmonary embolism: Secondary | ICD-10-CM | POA: Diagnosis not present

## 2015-09-21 DIAGNOSIS — I471 Supraventricular tachycardia, unspecified: Secondary | ICD-10-CM

## 2015-09-21 DIAGNOSIS — I10 Essential (primary) hypertension: Secondary | ICD-10-CM

## 2015-09-21 DIAGNOSIS — Z9289 Personal history of other medical treatment: Secondary | ICD-10-CM

## 2015-09-21 DIAGNOSIS — Z87898 Personal history of other specified conditions: Secondary | ICD-10-CM

## 2015-09-21 MED ORDER — METOPROLOL SUCCINATE ER 25 MG PO TB24: 25.0000 mg | ORAL_TABLET | Freq: Every day | ORAL | Status: DC

## 2015-09-21 NOTE — Progress Notes (Signed)
Patient ID: Dorothy Ford, female   DOB: [birth year], 65 y.o.   MRN: [id number]      SUBJECTIVE: The patient presents for routine follow-up. She was hospitalized in December at [facility] for severe bradycardia secondary to taking an extra dose of diltiazem unintentionally. She took both immediate release and sustained release Cardizem. Heart rate was 47 bpm.  She has a history of PSVT, sinus tachycardia with palpitations, HTN, chronic diastolic heart failure, COPD, and pulmonary embolism. Has issues with anxiety and panic attacks from time to time.  She has been battling sinus infections. She saw her PCP and she remained bradycardic so he discontinued diltiazem altogether and reduced her dose of metoprolol succinate to 25 mg daily. She has chronic leg swelling for which she takes Lasix. She denies chest pain. She has shortness of breath when climbing stairs. This is chronic.   Review of Systems: As per "subjective", otherwise negative.  Allergies  Allergen Reactions  . Adhesive [Tape]   . Codeine     REACTION: vomiting    Current Outpatient Prescriptions  Medication Sig Dispense Refill  . atorvastatin (LIPITOR) 40 MG tablet Take 40 mg by mouth daily.    . clonazePAM (KLONOPIN) 0.5 MG tablet Take 0.5 mg by mouth every morning.    . colchicine 0.6 MG tablet Take 0.6 mg by mouth 2 (two) times daily.    . cycloSPORINE (RESTASIS) 0.05 % ophthalmic emulsion Place 1 drop into both eyes 2 (two) times daily.    . DULoxetine (CYMBALTA) 30 MG capsule Take 30 mg by mouth 2 (two) times daily.     . fluticasone (FLONASE) 50 MCG/ACT nasal spray Place 1 spray into the nose daily.     . furosemide (LASIX) 40 MG tablet TAKE (1) TABLET BY MOUTH ONCE DAILY. 30 tablet 6  . levalbuterol (XOPENEX HFA) 45 MCG/ACT inhaler Inhale 2 puffs into the lungs every 6 (six) hours as needed for wheezing.    Marland Kitchen levothyroxine (SYNTHROID, LEVOTHROID) 50 MCG tablet Take 50 mcg by mouth daily before breakfast.     .  loperamide (IMODIUM A-D) 2 MG tablet Take 2 mg by mouth 4 (four) times daily as needed for diarrhea or loose stools.    . metoprolol succinate (TOPROL-XL) 50 MG 24 hr tablet TAKE 1 TABLET ONCE A DAY WITH A MEAL OR IMMEDIATELY FOLLOWING A MEAL. 30 tablet 6  . montelukast (SINGULAIR) 10 MG tablet Take 1 tablet by mouth daily.    Marland Kitchen nystatin (MYCOSTATIN) powder Apply topically as needed.    Marland Kitchen omeprazole (PRILOSEC) 20 MG capsule Take 20 mg by mouth daily.     . pantoprazole (PROTONIX) 40 MG tablet Take 40 mg by mouth daily.    Marland Kitchen PARoxetine (PAXIL) 30 MG tablet Take 30 mg by mouth daily.    . potassium chloride (K-DUR) 10 MEQ tablet Take 2 tablets by mouth daily.    . Simethicone (GAS-X EXTRA STRENGTH) 125 MG CAPS Take 2 capsules by mouth as needed.    . SYMBICORT 160-4.5 MCG/ACT inhaler Inhale 2 puffs into the lungs 2 (two) times daily.    . traMADol (ULTRAM) 50 MG tablet Take 50-100 mg by mouth 2 (two) times daily.    . traZODone (DESYREL) 50 MG tablet Take 50-100 mg by mouth at bedtime.     Marland Kitchen warfarin (COUMADIN) 5 MG tablet Take 5 mg by mouth daily. Managed by [facility] office      No current facility-administered medications for this visit.    Past Medical  History  Diagnosis Date  . SVT (supraventricular tachycardia) ([place])   . Chest pain, unspecified   . Congestive heart failure, unspecified   . Depressive disorder, not elsewhere classified   . 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 Unspecified essential hypertension   . Cardiac dysrhythmia, unspecified   . Palpitations   . Chronic airway obstruction, not elsewhere classified   . Obesity, unspecified   . History of rheumatoid arthritis   . Carotid bruit     Bilateral  . Syncope     admitted 09/2011    Past Surgical History  Procedure Laterality Date  . Carpal tunnel release    . Catheter ablation  02/1997    @ [facility]  . Vein surgery bladder      Social History   Social History  . Marital Status:  Single    Spouse Name: N/A  . Number of Children: N/A  . Years of Education: N/A   Occupational History  . Not on file.   Social History Main Topics  . Smoking status: Never Smoker   . Smokeless tobacco: Never Used  . Alcohol Use: No  . Drug Use: No  . Sexual Activity: Not on file   Other Topics Concern  . Not on file   Social History Narrative     Filed Vitals:   09/21/15 0928  BP: 138/90  Pulse: 65  Height: 5\' 1"  (1.549 m)  Weight: 192 lb (87.091 kg)  SpO2: 95%    PHYSICAL EXAM General: NAD HEENT: Normal. Neck: No JVD, no thyromegaly. Lungs: Clear to auscultation bilaterally with normal respiratory effort. CV: Nondisplaced PMI. Regular rate and rhythm, normal S1/S2, no S3/S4, no murmur. Trivial pretibial and periankle edema. Bilateral venous varicosities. Abdomen: Soft, nontender, obese, no distention.  Neurologic: Alert and oriented x 3.  Psych: Somewhat flat affect. Skin: Normal. Musculoskeletal: No gross deformities. Extremities: No clubbing or cyanosis.   ECG: Most recent ECG reviewed.      ASSESSMENT AND PLAN:  Palpitations/Sinus Tachycardia/History of PSVT Stable on long-acting metoprolol. Not bradycardic. She apparently had a previous RF ablation procedure in 1998, at [facility], which was unsuccessful.   Chronic diastolic heart failure  Euvolemic by history and exam. BP reasonably controlled. No changes.  Pulmonary embolism  On chronic anticoagulation with warfarin, followed by primary M.D.   Essential HTN Borderline elevated. Will monitor.  Dispo: f/u 1 year.   Kate Sable, M.D., F.A.C.C.

## 2015-09-21 NOTE — Patient Instructions (Signed)
Continue all current medications. Your physician wants you to follow up in:  1 year.  You will receive a reminder letter in the mail one-two months in advance.  If you don't receive a letter, please call our office to schedule the follow up appointment   

## 2015-10-03 DIAGNOSIS — J45909 Unspecified asthma, uncomplicated: Secondary | ICD-10-CM | POA: Diagnosis not present

## 2015-10-05 DIAGNOSIS — Z7901 Long term (current) use of anticoagulants: Secondary | ICD-10-CM | POA: Diagnosis not present

## 2015-10-27 DIAGNOSIS — Z7901 Long term (current) use of anticoagulants: Secondary | ICD-10-CM | POA: Diagnosis not present

## 2015-11-04 DIAGNOSIS — J3089 Other allergic rhinitis: Secondary | ICD-10-CM | POA: Diagnosis not present

## 2015-11-08 DIAGNOSIS — M9901 Segmental and somatic dysfunction of cervical region: Secondary | ICD-10-CM | POA: Diagnosis not present

## 2015-11-08 DIAGNOSIS — M542 Cervicalgia: Secondary | ICD-10-CM | POA: Diagnosis not present

## 2015-11-08 DIAGNOSIS — M9902 Segmental and somatic dysfunction of thoracic region: Secondary | ICD-10-CM | POA: Diagnosis not present

## 2015-11-08 DIAGNOSIS — M546 Pain in thoracic spine: Secondary | ICD-10-CM | POA: Diagnosis not present

## 2015-11-09 DIAGNOSIS — Z7901 Long term (current) use of anticoagulants: Secondary | ICD-10-CM | POA: Diagnosis not present

## 2015-11-30 DIAGNOSIS — Z7901 Long term (current) use of anticoagulants: Secondary | ICD-10-CM | POA: Diagnosis not present

## 2015-12-22 DIAGNOSIS — Z7901 Long term (current) use of anticoagulants: Secondary | ICD-10-CM | POA: Diagnosis not present

## 2016-01-02 DIAGNOSIS — M07672 Enteropathic arthropathies, left ankle and foot: Secondary | ICD-10-CM | POA: Diagnosis not present

## 2016-01-03 DIAGNOSIS — M5137 Other intervertebral disc degeneration, lumbosacral region: Secondary | ICD-10-CM | POA: Diagnosis not present

## 2016-01-03 DIAGNOSIS — M797 Fibromyalgia: Secondary | ICD-10-CM | POA: Diagnosis not present

## 2016-01-03 DIAGNOSIS — M1711 Unilateral primary osteoarthritis, right knee: Secondary | ICD-10-CM | POA: Diagnosis not present

## 2016-01-03 DIAGNOSIS — Z79899 Other long term (current) drug therapy: Secondary | ICD-10-CM | POA: Diagnosis not present

## 2016-01-03 DIAGNOSIS — M170 Bilateral primary osteoarthritis of knee: Secondary | ICD-10-CM | POA: Diagnosis not present

## 2016-01-03 DIAGNOSIS — R5381 Other malaise: Secondary | ICD-10-CM | POA: Diagnosis not present

## 2016-01-06 DIAGNOSIS — M9901 Segmental and somatic dysfunction of cervical region: Secondary | ICD-10-CM | POA: Diagnosis not present

## 2016-01-06 DIAGNOSIS — M546 Pain in thoracic spine: Secondary | ICD-10-CM | POA: Diagnosis not present

## 2016-01-06 DIAGNOSIS — M542 Cervicalgia: Secondary | ICD-10-CM | POA: Diagnosis not present

## 2016-01-06 DIAGNOSIS — M9902 Segmental and somatic dysfunction of thoracic region: Secondary | ICD-10-CM | POA: Diagnosis not present

## 2016-01-19 DIAGNOSIS — Z7901 Long term (current) use of anticoagulants: Secondary | ICD-10-CM | POA: Diagnosis not present

## 2016-01-27 DIAGNOSIS — Z7951 Long term (current) use of inhaled steroids: Secondary | ICD-10-CM | POA: Diagnosis not present

## 2016-01-27 DIAGNOSIS — Z79899 Other long term (current) drug therapy: Secondary | ICD-10-CM | POA: Diagnosis not present

## 2016-01-27 DIAGNOSIS — Z86711 Personal history of pulmonary embolism: Secondary | ICD-10-CM | POA: Diagnosis not present

## 2016-01-27 DIAGNOSIS — J449 Chronic obstructive pulmonary disease, unspecified: Secondary | ICD-10-CM | POA: Diagnosis not present

## 2016-01-27 DIAGNOSIS — I509 Heart failure, unspecified: Secondary | ICD-10-CM | POA: Diagnosis not present

## 2016-01-27 DIAGNOSIS — Z7901 Long term (current) use of anticoagulants: Secondary | ICD-10-CM | POA: Diagnosis not present

## 2016-01-27 DIAGNOSIS — I110 Hypertensive heart disease with heart failure: Secondary | ICD-10-CM | POA: Diagnosis not present

## 2016-01-27 DIAGNOSIS — N898 Other specified noninflammatory disorders of vagina: Secondary | ICD-10-CM | POA: Diagnosis not present

## 2016-01-27 DIAGNOSIS — L292 Pruritus vulvae: Secondary | ICD-10-CM | POA: Diagnosis not present

## 2016-02-06 DIAGNOSIS — M797 Fibromyalgia: Secondary | ICD-10-CM | POA: Diagnosis not present

## 2016-02-06 DIAGNOSIS — Z79899 Other long term (current) drug therapy: Secondary | ICD-10-CM | POA: Diagnosis not present

## 2016-02-06 DIAGNOSIS — J4541 Moderate persistent asthma with (acute) exacerbation: Secondary | ICD-10-CM | POA: Diagnosis not present

## 2016-02-06 DIAGNOSIS — Z7901 Long term (current) use of anticoagulants: Secondary | ICD-10-CM | POA: Diagnosis not present

## 2016-02-29 DIAGNOSIS — Z7901 Long term (current) use of anticoagulants: Secondary | ICD-10-CM | POA: Diagnosis not present

## 2016-03-20 DIAGNOSIS — I10 Essential (primary) hypertension: Secondary | ICD-10-CM | POA: Diagnosis not present

## 2016-03-20 DIAGNOSIS — J4541 Moderate persistent asthma with (acute) exacerbation: Secondary | ICD-10-CM | POA: Diagnosis not present

## 2016-03-20 DIAGNOSIS — I5032 Chronic diastolic (congestive) heart failure: Secondary | ICD-10-CM | POA: Diagnosis not present

## 2016-03-20 DIAGNOSIS — M797 Fibromyalgia: Secondary | ICD-10-CM | POA: Diagnosis not present

## 2016-03-22 DIAGNOSIS — M797 Fibromyalgia: Secondary | ICD-10-CM | POA: Diagnosis not present

## 2016-03-22 DIAGNOSIS — Z7901 Long term (current) use of anticoagulants: Secondary | ICD-10-CM | POA: Diagnosis not present

## 2016-03-22 DIAGNOSIS — I10 Essential (primary) hypertension: Secondary | ICD-10-CM | POA: Diagnosis not present

## 2016-03-22 DIAGNOSIS — J4541 Moderate persistent asthma with (acute) exacerbation: Secondary | ICD-10-CM | POA: Diagnosis not present

## 2016-03-30 DIAGNOSIS — M170 Bilateral primary osteoarthritis of knee: Secondary | ICD-10-CM | POA: Diagnosis not present

## 2016-04-05 DIAGNOSIS — J301 Allergic rhinitis due to pollen: Secondary | ICD-10-CM | POA: Diagnosis not present

## 2016-04-05 DIAGNOSIS — Z87891 Personal history of nicotine dependence: Secondary | ICD-10-CM | POA: Diagnosis not present

## 2016-04-05 DIAGNOSIS — J45909 Unspecified asthma, uncomplicated: Secondary | ICD-10-CM | POA: Diagnosis not present

## 2016-04-06 DIAGNOSIS — M170 Bilateral primary osteoarthritis of knee: Secondary | ICD-10-CM | POA: Diagnosis not present

## 2016-04-11 DIAGNOSIS — Z7901 Long term (current) use of anticoagulants: Secondary | ICD-10-CM | POA: Diagnosis not present

## 2016-04-17 DIAGNOSIS — M170 Bilateral primary osteoarthritis of knee: Secondary | ICD-10-CM | POA: Diagnosis not present

## 2016-04-24 DIAGNOSIS — M170 Bilateral primary osteoarthritis of knee: Secondary | ICD-10-CM | POA: Diagnosis not present

## 2016-04-24 DIAGNOSIS — Z5181 Encounter for therapeutic drug level monitoring: Secondary | ICD-10-CM | POA: Diagnosis not present

## 2016-05-02 DIAGNOSIS — M170 Bilateral primary osteoarthritis of knee: Secondary | ICD-10-CM | POA: Diagnosis not present

## 2016-05-09 DIAGNOSIS — Z7901 Long term (current) use of anticoagulants: Secondary | ICD-10-CM | POA: Diagnosis not present

## 2016-05-15 DIAGNOSIS — M797 Fibromyalgia: Secondary | ICD-10-CM | POA: Diagnosis not present

## 2016-05-15 DIAGNOSIS — I10 Essential (primary) hypertension: Secondary | ICD-10-CM | POA: Diagnosis not present

## 2016-05-15 DIAGNOSIS — E038 Other specified hypothyroidism: Secondary | ICD-10-CM | POA: Diagnosis not present

## 2016-05-15 DIAGNOSIS — E784 Other hyperlipidemia: Secondary | ICD-10-CM | POA: Diagnosis not present

## 2016-05-15 DIAGNOSIS — M1009 Idiopathic gout, multiple sites: Secondary | ICD-10-CM | POA: Diagnosis not present

## 2016-05-15 DIAGNOSIS — J4551 Severe persistent asthma with (acute) exacerbation: Secondary | ICD-10-CM | POA: Diagnosis not present

## 2016-05-23 DIAGNOSIS — Z7901 Long term (current) use of anticoagulants: Secondary | ICD-10-CM | POA: Diagnosis not present

## 2016-06-01 DIAGNOSIS — J4541 Moderate persistent asthma with (acute) exacerbation: Secondary | ICD-10-CM | POA: Diagnosis not present

## 2016-06-01 DIAGNOSIS — I10 Essential (primary) hypertension: Secondary | ICD-10-CM | POA: Diagnosis not present

## 2016-06-01 DIAGNOSIS — I5032 Chronic diastolic (congestive) heart failure: Secondary | ICD-10-CM | POA: Diagnosis not present

## 2016-06-01 DIAGNOSIS — M797 Fibromyalgia: Secondary | ICD-10-CM | POA: Diagnosis not present

## 2016-06-09 ENCOUNTER — Emergency Department (HOSPITAL_COMMUNITY): Payer: Medicare Other

## 2016-06-09 ENCOUNTER — Emergency Department (HOSPITAL_COMMUNITY)
Admission: EM | Admit: 2016-06-09 | Discharge: 2016-06-09 | Disposition: A | Discharge status: Home / Self Care | Payer: Medicare Other | Attending: Emergency Medicine | Admitting: Emergency Medicine

## 2016-06-09 ENCOUNTER — Encounter (HOSPITAL_COMMUNITY): Payer: Self-pay | Admitting: Emergency Medicine

## 2016-06-09 DIAGNOSIS — Y929 Unspecified place or not applicable: Secondary | ICD-10-CM | POA: Insufficient documentation

## 2016-06-09 DIAGNOSIS — Z79899 Other long term (current) drug therapy: Secondary | ICD-10-CM | POA: Insufficient documentation

## 2016-06-09 DIAGNOSIS — S4992XA Unspecified injury of left shoulder and upper arm, initial encounter: Secondary | ICD-10-CM | POA: Diagnosis not present

## 2016-06-09 DIAGNOSIS — G4489 Other headache syndrome: Secondary | ICD-10-CM | POA: Diagnosis not present

## 2016-06-09 DIAGNOSIS — Y999 Unspecified external cause status: Secondary | ICD-10-CM | POA: Insufficient documentation

## 2016-06-09 DIAGNOSIS — M545 Low back pain: Secondary | ICD-10-CM | POA: Diagnosis not present

## 2016-06-09 DIAGNOSIS — S199XXA Unspecified injury of neck, initial encounter: Secondary | ICD-10-CM | POA: Diagnosis not present

## 2016-06-09 DIAGNOSIS — W06XXXA Fall from bed, initial encounter: Secondary | ICD-10-CM | POA: Diagnosis not present

## 2016-06-09 DIAGNOSIS — M542 Cervicalgia: Secondary | ICD-10-CM | POA: Insufficient documentation

## 2016-06-09 DIAGNOSIS — Y939 Activity, unspecified: Secondary | ICD-10-CM | POA: Insufficient documentation

## 2016-06-09 DIAGNOSIS — J449 Chronic obstructive pulmonary disease, unspecified: Secondary | ICD-10-CM | POA: Insufficient documentation

## 2016-06-09 DIAGNOSIS — M25512 Pain in left shoulder: Secondary | ICD-10-CM | POA: Diagnosis not present

## 2016-06-09 DIAGNOSIS — R030 Elevated blood-pressure reading, without diagnosis of hypertension: Secondary | ICD-10-CM | POA: Diagnosis not present

## 2016-06-09 DIAGNOSIS — S0990XA Unspecified injury of head, initial encounter: Secondary | ICD-10-CM | POA: Diagnosis not present

## 2016-06-09 DIAGNOSIS — W19XXXA Unspecified fall, initial encounter: Secondary | ICD-10-CM

## 2016-06-09 HISTORY — DX: Pure hypercholesterolemia, unspecified: E78.00

## 2016-06-09 HISTORY — DX: Fibromyalgia: M79.7

## 2016-06-09 HISTORY — DX: Disorder of thyroid, unspecified: E07.9

## 2016-06-09 HISTORY — DX: Chronic sinusitis, unspecified: J32.9

## 2016-06-09 HISTORY — DX: Other specified crystal arthropathies, multiple sites: M11.89

## 2016-06-09 HISTORY — DX: Chronic obstructive pulmonary disease, unspecified: J44.9

## 2016-06-09 HISTORY — DX: Gastro-esophageal reflux disease without esophagitis: K21.9

## 2016-06-09 HISTORY — DX: Unspecified asthma, uncomplicated: J45.909

## 2016-06-09 HISTORY — DX: Edema, unspecified: R60.9

## 2016-06-09 NOTE — ED Provider Notes (Signed)
[facility] DEPT Provider Note   CSN: [id number] Arrival date & time: 06/09/16  1104  By signing my name below, I, Higinio Plan, attest that this documentation has been prepared under the direction and in the presence of Milton Ferguson, MD . Electronically Signed: Higinio Plan, Scribe. 06/09/2016. 11:26 AM.  History   Chief Complaint Chief Complaint  Patient presents with  . Fall   Patient fell today hit her head no loss consciousness   The history is provided by the patient. No language interpreter was used.  Fall  This is a new problem. The current episode started less than 1 hour ago. The problem occurs rarely. The problem has been gradually worsening. Pertinent negatives include no chest pain, no abdominal pain and no headaches. Nothing aggravates the symptoms. Nothing relieves the symptoms.   HPI Comments: Dorothy Ford is a 65 y.o. female who presents to the Emergency Department complaining of gradually worsening, neck, back and left shoulder pain s/p a fall that occurred this morning. Pt reports she had a "bad dream" that caused her to fall out of bed, striking her head on her nighttable. She notes several recent falls.   Past Medical History:  Diagnosis Date  . Cardiac dysrhythmia, unspecified   . Carotid bruit    Bilateral  . Chest pain, unspecified   . Chronic airway obstruction, not elsewhere classified ([place])   . Congestive heart failure, unspecified ([place])   . Depressive disorder, not elsewhere classified   . 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 History of rheumatoid arthritis   . Obesity, unspecified   . Palpitations   . SVT (supraventricular tachycardia) ([place])   . Syncope    admitted 09/2011  . Unspecified essential hypertension    Patient Active Problem List   Diagnosis Date Noted  . Pulmonary embolism ([place]) 01/18/2012  . Anxiety 01/18/2012  . Syncope   . RHEUMATOID LUNG 12/30/2009  . OBESITY, UNSPECIFIED 06/02/2009  .  DEPRESSIVE DISORDER NOT ELSEWHERE CLASSIFIED 06/02/2009  . UNSPECIFIED ESSENTIAL HYPERTENSION 06/02/2009  . SUPRAVENTRICULAR TACHYCARDIA 06/02/2009  . Chronic diastolic heart failure ([place]) 06/02/2009  . COPD 06/02/2009  . DIAPHRAGMAT HERN W/O MENTION OBSTRUCTION/GANGREN 06/02/2009  . PALPITATIONS 06/02/2009  . CHEST PAIN UNSPECIFIED 06/02/2009   Past Surgical History:  Procedure Laterality Date  . CARPAL TUNNEL RELEASE    . CATHETER ABLATION  02/1997   @ [place]      OB History    No data available     Home Medications    Prior to Admission medications   Medication Sig Start Date End Date Taking? Authorizing Provider  atorvastatin (LIPITOR) 40 MG tablet Take 40 mg by mouth daily.    Historical Provider, MD  clonazePAM (KLONOPIN) 0.5 MG tablet Take 0.5 mg by mouth every morning.    Ezra Sites, MD  colchicine 0.6 MG tablet Take 0.6 mg by mouth 2 (two) times daily.    Historical Provider, MD  cycloSPORINE (RESTASIS) 0.05 % ophthalmic emulsion Place 1 drop into both eyes 2 (two) times daily.    Historical Provider, MD  DULoxetine (CYMBALTA) 30 MG capsule Take 30 mg by mouth 2 (two) times daily.  04/10/14   Historical Provider, MD  fluticasone (FLONASE) 50 MCG/ACT nasal spray Place 1 spray into the nose daily.     Historical Provider, MD  furosemide (LASIX) 40 MG tablet TAKE (1) TABLET BY MOUTH ONCE DAILY. 09/17/14   Herminio Commons, MD  levalbuterol (XOPENEX HFA) 45 MCG/ACT inhaler Inhale 2 puffs into the lungs every 6 (six) hours as needed for wheezing.    Historical Provider, MD  levothyroxine (SYNTHROID, LEVOTHROID) 50 MCG tablet Take 50 mcg by mouth daily before breakfast.  02/20/14   Historical Provider, MD  loperamide (IMODIUM A-D) 2 MG tablet Take 2 mg by mouth 4 (four) times daily as needed for diarrhea or loose stools.    Historical Provider, MD  metoprolol succinate (TOPROL-XL) 25 MG 24 hr tablet Take 1 tablet (25 mg total) by mouth daily. 09/21/15    Herminio Commons, MD  montelukast (SINGULAIR) 10 MG tablet Take 1 tablet by mouth daily. 05/10/15   Historical Provider, MD  nystatin (MYCOSTATIN) powder Apply topically as needed.    Historical Provider, MD  omeprazole (PRILOSEC) 20 MG capsule Take 20 mg by mouth daily.  05/05/14   Historical Provider, MD  pantoprazole (PROTONIX) 40 MG tablet Take 40 mg by mouth daily.    Historical Provider, MD  PARoxetine (PAXIL) 30 MG tablet Take 30 mg by mouth daily.    Historical Provider, MD  potassium chloride (K-DUR) 10 MEQ tablet Take 2 tablets by mouth daily. 03/31/13   Historical Provider, MD  Simethicone (GAS-X EXTRA STRENGTH) 125 MG CAPS Take 2 capsules by mouth as needed.    Historical Provider, MD  SYMBICORT 160-4.5 MCG/ACT inhaler Inhale 2 puffs into the lungs 2 (two) times daily. 05/23/15   Historical Provider, MD  traMADol (ULTRAM) 50 MG tablet Take 50-100 mg by mouth 2 (two) times daily.    Historical Provider, MD  traZODone (DESYREL) 50 MG tablet Take 50-100 mg by mouth at bedtime.     Ezra Sites, MD  warfarin (COUMADIN) 5 MG tablet Take 5 mg by mouth daily. Managed by [facility] office     Historical Provider, MD   Family History Family History  Problem Relation Age of Onset  . Cancer Mother   . Heart disease Mother   . Stroke      Social History Social History  Substance Use Topics  . Smoking status: Never Smoker  . Smokeless tobacco: Never Used  . Alcohol use No     Allergies   Adhesive [tape] and Codeine   Review of Systems Review of Systems  Constitutional: Negative for appetite change and fatigue.  HENT: Negative for congestion, ear discharge and sinus pressure.   Eyes: Negative for discharge.  Respiratory: Negative for cough.   Cardiovascular: Negative for chest pain.  Gastrointestinal: Negative for abdominal pain and diarrhea.  Genitourinary: Negative for frequency and hematuria.  Musculoskeletal: Positive for arthralgias, back pain and neck pain.  Skin: Negative  for rash.  Neurological: Negative for seizures and headaches.  Psychiatric/Behavioral: Negative for hallucinations.   Physical Exam Updated Vital Signs BP 116/65 (BP Location: Left Arm)   Pulse 84   Temp 98.9 F (37.2 C) (Oral)   Resp 20   Ht 5\' 1"  (1.549 m)   Wt 197 lb (89.4 kg)   SpO2 99%   BMI 37.22 kg/m   Physical Exam  Constitutional: She is oriented to person, place, and time. She appears well-developed.  HENT:  Head: Normocephalic.  Eyes: Conjunctivae and EOM are normal. No scleral icterus.  Neck: Neck supple. No thyromegaly present.  Cardiovascular: Normal rate and regular rhythm.  Exam reveals no gallop and no friction rub.   No murmur heard. Pulmonary/Chest: No stridor. She has no wheezes. She has no rales. She exhibits no tenderness.  Abdominal:  She exhibits no distension. There is no tenderness. There is no rebound.  Musculoskeletal: Normal range of motion. She exhibits tenderness. She exhibits no edema.  Moderate tenderness to left shoulder, posterior neck and lumbar spine.   Lymphadenopathy:    She has no cervical adenopathy.  Neurological: She is oriented to person, place, and time. She exhibits normal muscle tone. Coordination normal.  Skin: No rash noted. No erythema.  Psychiatric: She has a normal mood and affect. Her behavior is normal.   ED Treatments / Results  Labs (all labs ordered are listed, but only abnormal results are displayed) Labs Reviewed - No data to display  EKG  EKG Interpretation None       Radiology Dg Lumbar Spine Complete  Result Date: 06/09/2016 CLINICAL DATA:  [place] getting out of bed this morning. Low back pain. EXAM: LUMBAR SPINE - COMPLETE 4+ VIEW COMPARISON:  06/30/2013 FINDINGS: No evidence of fracture. Chronic lumbar degenerative disc disease and degenerative facet disease, with 3 mm of anterolisthesis at L4-5 as seen previously. IMPRESSION: No acute or traumatic finding. Chronic lumbar degenerative disc disease and  degenerative facet disease. Electronically Signed   By: Nelson Chimes M.D.   On: 06/09/2016 12:25   Ct Head Wo Contrast  Result Date: 06/09/2016 CLINICAL DATA:  neck, back and left shoulder pain s/p a fall that occurred this morning. Pt reports she had a "bad dream" that caused her to fall out of bed, striking her head on her nighttable. She notes several recent falls. Hx of HTN,CHF, rheumatoid arthritis EXAM: CT HEAD WITHOUT CONTRAST CT CERVICAL SPINE WITHOUT CONTRAST TECHNIQUE: Multidetector CT imaging of the head and cervical spine was performed following the standard protocol without intravenous contrast. Multiplanar CT image reconstructions of the cervical spine were also generated. COMPARISON:  08/14/2015 FINDINGS: CT HEAD FINDINGS Brain: No evidence of acute infarction, hemorrhage, hydrocephalus, extra-axial collection or mass lesion/mass effect. Vascular: No hyperdense vessel or unexpected calcification. Skull: Normal. Negative for fracture or focal lesion. Sinuses/Orbits: No acute finding. Other: None CT CERVICAL SPINE FINDINGS Alignment: Mild kyphosis centered at the C5-C6 level. No spondylolisthesis. Skull base and vertebrae: No acute fracture. No primary bone lesion or focal pathologic process. Soft tissues and spinal canal: No prevertebral fluid or swelling. No visible canal hematoma. Disc levels: There are disc degenerative changes from C3-C4 through C7-T1 with mild to moderate loss of disc height and small endplate osteophytes. Facet degenerative changes noted greatest on the left at C4-C5. Mild disc bulging. No convincing disc herniation. No significant stenosis. Upper chest: Unremarkable. Other: None IMPRESSION: HEAD CT:  No acute intracranial abnormality.  No skull fracture. CERVICAL CT:  No fracture or acute finding. Electronically Signed   By: Lajean Manes M.D.   On: 06/09/2016 13:07   Ct Cervical Spine Wo Contrast  Result Date: 06/09/2016 CLINICAL DATA:  neck, back and left shoulder pain  s/p a fall that occurred this morning. Pt reports she had a "bad dream" that caused her to fall out of bed, striking her head on her nighttable. She notes several recent falls. Hx of HTN,CHF, rheumatoid arthritis EXAM: CT HEAD WITHOUT CONTRAST CT CERVICAL SPINE WITHOUT CONTRAST TECHNIQUE: Multidetector CT imaging of the head and cervical spine was performed following the standard protocol without intravenous contrast. Multiplanar CT image reconstructions of the cervical spine were also generated. COMPARISON:  08/14/2015 FINDINGS: CT HEAD FINDINGS Brain: No evidence of acute infarction, hemorrhage, hydrocephalus, extra-axial collection or mass lesion/mass effect. Vascular: No hyperdense vessel or unexpected calcification. Skull:  Normal. Negative for fracture or focal lesion. Sinuses/Orbits: No acute finding. Other: None CT CERVICAL SPINE FINDINGS Alignment: Mild kyphosis centered at the C5-C6 level. No spondylolisthesis. Skull base and vertebrae: No acute fracture. No primary bone lesion or focal pathologic process. Soft tissues and spinal canal: No prevertebral fluid or swelling. No visible canal hematoma. Disc levels: There are disc degenerative changes from C3-C4 through C7-T1 with mild to moderate loss of disc height and small endplate osteophytes. Facet degenerative changes noted greatest on the left at C4-C5. Mild disc bulging. No convincing disc herniation. No significant stenosis. Upper chest: Unremarkable. Other: None IMPRESSION: HEAD CT:  No acute intracranial abnormality.  No skull fracture. CERVICAL CT:  No fracture or acute finding. Electronically Signed   By: Lajean Manes M.D.   On: 06/09/2016 13:07   Dg Shoulder Left  Result Date: 06/09/2016 CLINICAL DATA:  [place] getting out of bit head.  Left shoulder pain. EXAM: LEFT SHOULDER - 2+ VIEW COMPARISON:  None. FINDINGS: No evidence of fracture or dislocation. Subacromial calcification could represent chronic bursal calcification or tendon  calcification. IMPRESSION: No acute or traumatic finding. Possible subacromial bursal or tendon calcification. Electronically Signed   By: Nelson Chimes M.D.   On: 06/09/2016 12:24    Procedures Procedures (including critical care time)  Medications Ordered in ED Medications - No data to display  DIAGNOSTIC STUDIES:  Oxygen Saturation is 99% on RA, normal by my interpretation.    COORDINATION OF CARE:  11:23 AM Discussed treatment plan with pt at bedside and pt agreed to plan.  Initial Impression / Assessment and Plan / ED Course  I have reviewed the triage vital signs and the nursing notes.  Pertinent labs & imaging results that were available during my care of the patient were reviewed by me and considered in my medical decision making (see chart for details).  Clinical Course    Contusion to head with cervical strain from fall. Patient will take her own pain medicine and follow-up with her family doctor as needed. Patient also is contusion to left shoulder and lumbar strain  I personally performed the services described in this documentation, which was scribed in my presence. The recorded information has been reviewed and is accurate.   Final Clinical Impressions(s) / ED Diagnoses   Final diagnoses:  None  The chart was scribed for me under my direct supervision.  I personally performed the history, physical, and medical decision making and all procedures in the evaluation of this patient..   New Prescriptions New Prescriptions   No medications on file     Milton Ferguson, MD 06/09/16 1433

## 2016-06-09 NOTE — ED Notes (Signed)
Patient transported to X-ray & CT °

## 2016-06-09 NOTE — ED Triage Notes (Signed)
Per EMS, pt fell while getting out of bed this morning. Pt reports fell forward and hit face. Pt reports neck,back pain at this time. Pt reports several recent falls. Pt alert and oriented. nad noted.

## 2016-06-09 NOTE — Discharge Instructions (Signed)
Follow up with your md this week for recheck  °

## 2016-06-13 DIAGNOSIS — Z79899 Other long term (current) drug therapy: Secondary | ICD-10-CM | POA: Diagnosis not present

## 2016-06-13 DIAGNOSIS — Z7901 Long term (current) use of anticoagulants: Secondary | ICD-10-CM | POA: Diagnosis not present

## 2016-06-19 DIAGNOSIS — M545 Low back pain: Secondary | ICD-10-CM | POA: Diagnosis not present

## 2016-06-19 DIAGNOSIS — M542 Cervicalgia: Secondary | ICD-10-CM | POA: Diagnosis not present

## 2016-06-25 DIAGNOSIS — M545 Low back pain: Secondary | ICD-10-CM | POA: Diagnosis not present

## 2016-06-25 DIAGNOSIS — M542 Cervicalgia: Secondary | ICD-10-CM | POA: Diagnosis not present

## 2016-06-25 DIAGNOSIS — M9901 Segmental and somatic dysfunction of cervical region: Secondary | ICD-10-CM | POA: Diagnosis not present

## 2016-06-25 DIAGNOSIS — M9903 Segmental and somatic dysfunction of lumbar region: Secondary | ICD-10-CM | POA: Diagnosis not present

## 2016-06-25 DIAGNOSIS — M9902 Segmental and somatic dysfunction of thoracic region: Secondary | ICD-10-CM | POA: Diagnosis not present

## 2016-06-25 DIAGNOSIS — M546 Pain in thoracic spine: Secondary | ICD-10-CM | POA: Diagnosis not present

## 2016-06-28 DIAGNOSIS — M797 Fibromyalgia: Secondary | ICD-10-CM | POA: Diagnosis not present

## 2016-06-28 DIAGNOSIS — J4541 Moderate persistent asthma with (acute) exacerbation: Secondary | ICD-10-CM | POA: Diagnosis not present

## 2016-06-28 DIAGNOSIS — I10 Essential (primary) hypertension: Secondary | ICD-10-CM | POA: Diagnosis not present

## 2016-06-28 DIAGNOSIS — I5032 Chronic diastolic (congestive) heart failure: Secondary | ICD-10-CM | POA: Diagnosis not present

## 2016-07-02 DIAGNOSIS — M546 Pain in thoracic spine: Secondary | ICD-10-CM | POA: Diagnosis not present

## 2016-07-02 DIAGNOSIS — M545 Low back pain: Secondary | ICD-10-CM | POA: Diagnosis not present

## 2016-07-02 DIAGNOSIS — M9903 Segmental and somatic dysfunction of lumbar region: Secondary | ICD-10-CM | POA: Diagnosis not present

## 2016-07-02 DIAGNOSIS — M9902 Segmental and somatic dysfunction of thoracic region: Secondary | ICD-10-CM | POA: Diagnosis not present

## 2016-07-02 DIAGNOSIS — M9901 Segmental and somatic dysfunction of cervical region: Secondary | ICD-10-CM | POA: Diagnosis not present

## 2016-07-02 DIAGNOSIS — M542 Cervicalgia: Secondary | ICD-10-CM | POA: Diagnosis not present

## 2016-07-05 ENCOUNTER — Ambulatory Visit: Payer: Self-pay | Admitting: Rheumatology

## 2016-07-06 DIAGNOSIS — Z7901 Long term (current) use of anticoagulants: Secondary | ICD-10-CM | POA: Diagnosis not present

## 2016-07-09 DIAGNOSIS — M542 Cervicalgia: Secondary | ICD-10-CM | POA: Diagnosis not present

## 2016-07-09 DIAGNOSIS — M546 Pain in thoracic spine: Secondary | ICD-10-CM | POA: Diagnosis not present

## 2016-07-09 DIAGNOSIS — M545 Low back pain: Secondary | ICD-10-CM | POA: Diagnosis not present

## 2016-07-09 DIAGNOSIS — M9903 Segmental and somatic dysfunction of lumbar region: Secondary | ICD-10-CM | POA: Diagnosis not present

## 2016-07-09 DIAGNOSIS — M9902 Segmental and somatic dysfunction of thoracic region: Secondary | ICD-10-CM | POA: Diagnosis not present

## 2016-07-09 DIAGNOSIS — M9901 Segmental and somatic dysfunction of cervical region: Secondary | ICD-10-CM | POA: Diagnosis not present

## 2016-07-10 DIAGNOSIS — S5011XA Contusion of right forearm, initial encounter: Secondary | ICD-10-CM | POA: Diagnosis not present

## 2016-07-19 DIAGNOSIS — M797 Fibromyalgia: Secondary | ICD-10-CM | POA: Insufficient documentation

## 2016-07-19 DIAGNOSIS — M1120 Other chondrocalcinosis, unspecified site: Secondary | ICD-10-CM | POA: Insufficient documentation

## 2016-07-19 DIAGNOSIS — M170 Bilateral primary osteoarthritis of knee: Secondary | ICD-10-CM | POA: Insufficient documentation

## 2016-07-19 NOTE — Progress Notes (Deleted)
Office Visit Note  Patient: Dorothy Ford             Date of Birth: 25-Mar-1951           MRN: [id number]             PCP: Neale Burly, MD Referring: Neale Burly, MD Visit Date: 07/25/2016 Occupation: @GUAROCC @    Subjective:  No chief complaint on file.   History of Present Illness: Dorothy Ford is a 65 y.o. female ***   Activities of Daily Living:  Patient reports morning stiffness for *** {minute/hour:19697}.   Patient {ACTIONS;DENIES/REPORTS:21021675::"Denies"} nocturnal pain.  Difficulty dressing/grooming: {ACTIONS;DENIES/REPORTS:21021675::"Denies"} Difficulty climbing stairs: {ACTIONS;DENIES/REPORTS:21021675::"Denies"} Difficulty getting out of chair: {ACTIONS;DENIES/REPORTS:21021675::"Denies"} Difficulty using hands for taps, buttons, cutlery, and/or writing: {ACTIONS;DENIES/REPORTS:21021675::"Denies"}   No Rheumatology ROS completed.   PMFS History:  Patient Active Problem List   Diagnosis Date Noted  . Pseudogout 07/19/2016  . Primary osteoarthritis of knees, bilateral 07/19/2016  . Fibromyalgia 07/19/2016  . 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 Pulmonary embolism ([place]) 01/18/2012  . Anxiety 01/18/2012  . Syncope   . RHEUMATOID LUNG 12/30/2009  . OBESITY, UNSPECIFIED 06/02/2009  . DEPRESSIVE DISORDER NOT ELSEWHERE CLASSIFIED 06/02/2009  . COPD exacerbation ([place]) 06/02/2009  . SUPRAVENTRICULAR TACHYCARDIA 06/02/2009  . Chronic diastolic heart failure ([place]) 06/02/2009  . COPD 06/02/2009  . DIAPHRAGMAT HERN W/O MENTION OBSTRUCTION/GANGREN 06/02/2009  . PALPITATIONS 06/02/2009  . CHEST PAIN UNSPECIFIED 06/02/2009    Past Medical History:  Diagnosis Date  . Acid reflux   . Asthma   . Cardiac dysrhythmia, unspecified   . Carotid bruit    Bilateral  . Chest pain, unspecified   . Chronic airway obstruction, not elsewhere classified   . Chronic sinusitis   . Congestive heart failure, unspecified   . Depressive disorder, not elsewhere  classified   . 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 Edema    left leg  . Fibromyalgia   . High cholesterol   . History of rheumatoid arthritis   . Obesity, unspecified   . Obstructive lung disease (generalized) ([place])   . Palpitations   . Pseudogout involving multiple joints   . SVT (supraventricular tachycardia) ([place])   . Syncope    admitted 09/2011  . Thyroid disease   . Unspecified essential hypertension     Family History  Problem Relation Age of Onset  . Cancer Mother   . Heart disease Mother   . Stroke     Past Surgical History:  Procedure Laterality Date  . CARPAL TUNNEL RELEASE    . CATHETER ABLATION  02/1997   @ [place]     Social History   Social History Narrative  . No narrative on file     Objective: Vital Signs: There were no vitals taken for this visit.   Physical Exam   Musculoskeletal Exam: ***  CDAI Exam: No CDAI exam completed.    Investigation: Findings:  X-rays of bilateral knee joints 01/03/16 showed bilateral moderate medial compartment narrowing and chondrocalcinosis.  She has severe patellofemoral narrowing consistent with moderate osteoarthritis, chondrocalcinosis and chondromalacia of the patella.  UDS 04/26/16 consistent with medication prescribed     Imaging: No results found.  Speciality Comments: No specialty comments available.    Procedures:  No procedures performed Allergies: Adhesive [tape] and Codeine   Assessment / Plan: Visit Diagnoses: Chronic obstructive pulmonary disease, unspecified COPD type ([place])  Pseudogout  Primary osteoarthritis of knees, bilateral  Fibromyalgia  Chondrocalcinosis    Orders: No orders of the defined types were placed in this encounter.  No orders of the defined types were placed in this encounter.   Face-to-face time spent with patient was *** minutes. 50% of time was spent in counseling and coordination of  care.  Follow-Up Instructions: No Follow-up on file.   Amy Littrell, RT

## 2016-07-23 DIAGNOSIS — M542 Cervicalgia: Secondary | ICD-10-CM | POA: Diagnosis not present

## 2016-07-23 DIAGNOSIS — M9902 Segmental and somatic dysfunction of thoracic region: Secondary | ICD-10-CM | POA: Diagnosis not present

## 2016-07-23 DIAGNOSIS — M546 Pain in thoracic spine: Secondary | ICD-10-CM | POA: Diagnosis not present

## 2016-07-23 DIAGNOSIS — M9903 Segmental and somatic dysfunction of lumbar region: Secondary | ICD-10-CM | POA: Diagnosis not present

## 2016-07-23 DIAGNOSIS — M9901 Segmental and somatic dysfunction of cervical region: Secondary | ICD-10-CM | POA: Diagnosis not present

## 2016-07-23 DIAGNOSIS — M545 Low back pain: Secondary | ICD-10-CM | POA: Diagnosis not present

## 2016-07-24 ENCOUNTER — Encounter: Payer: Self-pay | Admitting: Rheumatology

## 2016-07-24 DIAGNOSIS — H409 Unspecified glaucoma: Secondary | ICD-10-CM | POA: Insufficient documentation

## 2016-07-24 DIAGNOSIS — M5030 Other cervical disc degeneration, unspecified cervical region: Secondary | ICD-10-CM

## 2016-07-24 DIAGNOSIS — M19041 Primary osteoarthritis, right hand: Secondary | ICD-10-CM

## 2016-07-24 DIAGNOSIS — M19042 Primary osteoarthritis, left hand: Secondary | ICD-10-CM

## 2016-07-24 DIAGNOSIS — M51369 Other intervertebral disc degeneration, lumbar region without mention of lumbar back pain or lower extremity pain: Secondary | ICD-10-CM

## 2016-07-24 DIAGNOSIS — M810 Age-related osteoporosis without current pathological fracture: Secondary | ICD-10-CM | POA: Insufficient documentation

## 2016-07-24 DIAGNOSIS — M5136 Other intervertebral disc degeneration, lumbar region: Secondary | ICD-10-CM

## 2016-07-24 DIAGNOSIS — G4730 Sleep apnea, unspecified: Secondary | ICD-10-CM | POA: Insufficient documentation

## 2016-07-24 HISTORY — DX: Other intervertebral disc degeneration, lumbar region: M51.36

## 2016-07-24 HISTORY — DX: Age-related osteoporosis without current pathological fracture: M81.0

## 2016-07-24 HISTORY — DX: Primary osteoarthritis, right hand: M19.041

## 2016-07-24 HISTORY — DX: Other intervertebral disc degeneration, lumbar region without mention of lumbar back pain or lower extremity pain: M51.369

## 2016-07-24 HISTORY — DX: Other cervical disc degeneration, unspecified cervical region: M50.30

## 2016-07-25 ENCOUNTER — Ambulatory Visit: Payer: Self-pay | Admitting: Rheumatology

## 2016-07-25 ENCOUNTER — Ambulatory Visit (INDEPENDENT_AMBULATORY_CARE_PROVIDER_SITE_OTHER): Payer: Medicare Other | Admitting: Rheumatology

## 2016-07-25 ENCOUNTER — Encounter: Payer: Self-pay | Admitting: Rheumatology

## 2016-07-25 VITALS — BP 142/84 | HR 80 | Resp 15 | Ht 60.0 in | Wt 194.0 lb

## 2016-07-25 DIAGNOSIS — M5136 Other intervertebral disc degeneration, lumbar region: Secondary | ICD-10-CM

## 2016-07-25 DIAGNOSIS — M797 Fibromyalgia: Secondary | ICD-10-CM

## 2016-07-25 DIAGNOSIS — I10 Essential (primary) hypertension: Secondary | ICD-10-CM | POA: Diagnosis not present

## 2016-07-25 DIAGNOSIS — H409 Unspecified glaucoma: Secondary | ICD-10-CM

## 2016-07-25 DIAGNOSIS — M19042 Primary osteoarthritis, left hand: Secondary | ICD-10-CM

## 2016-07-25 DIAGNOSIS — N959 Unspecified menopausal and perimenopausal disorder: Secondary | ICD-10-CM | POA: Diagnosis not present

## 2016-07-25 DIAGNOSIS — M19041 Primary osteoarthritis, right hand: Secondary | ICD-10-CM

## 2016-07-25 DIAGNOSIS — M1049 Other secondary gout, multiple sites: Secondary | ICD-10-CM | POA: Diagnosis not present

## 2016-07-25 DIAGNOSIS — M5030 Other cervical disc degeneration, unspecified cervical region: Secondary | ICD-10-CM | POA: Diagnosis not present

## 2016-07-25 DIAGNOSIS — M1120 Other chondrocalcinosis, unspecified site: Secondary | ICD-10-CM | POA: Diagnosis not present

## 2016-07-25 DIAGNOSIS — D689 Coagulation defect, unspecified: Secondary | ICD-10-CM | POA: Diagnosis not present

## 2016-07-25 DIAGNOSIS — J45909 Unspecified asthma, uncomplicated: Secondary | ICD-10-CM | POA: Diagnosis not present

## 2016-07-25 DIAGNOSIS — E876 Hypokalemia: Secondary | ICD-10-CM | POA: Diagnosis not present

## 2016-07-25 DIAGNOSIS — M810 Age-related osteoporosis without current pathological fracture: Secondary | ICD-10-CM | POA: Diagnosis not present

## 2016-07-25 DIAGNOSIS — M170 Bilateral primary osteoarthritis of knee: Secondary | ICD-10-CM

## 2016-07-25 DIAGNOSIS — M109 Gout, unspecified: Secondary | ICD-10-CM | POA: Diagnosis not present

## 2016-07-25 DIAGNOSIS — G4734 Idiopathic sleep related nonobstructive alveolar hypoventilation: Secondary | ICD-10-CM | POA: Diagnosis not present

## 2016-07-25 DIAGNOSIS — Z7901 Long term (current) use of anticoagulants: Secondary | ICD-10-CM | POA: Diagnosis not present

## 2016-07-25 DIAGNOSIS — N390 Urinary tract infection, site not specified: Secondary | ICD-10-CM | POA: Diagnosis not present

## 2016-07-25 DIAGNOSIS — R310 Gross hematuria: Secondary | ICD-10-CM | POA: Diagnosis not present

## 2016-07-25 DIAGNOSIS — I749 Embolism and thrombosis of unspecified artery: Secondary | ICD-10-CM | POA: Diagnosis not present

## 2016-07-25 DIAGNOSIS — Z86711 Personal history of pulmonary embolism: Secondary | ICD-10-CM | POA: Diagnosis not present

## 2016-07-25 DIAGNOSIS — R319 Hematuria, unspecified: Secondary | ICD-10-CM | POA: Diagnosis not present

## 2016-07-25 DIAGNOSIS — F339 Major depressive disorder, recurrent, unspecified: Secondary | ICD-10-CM | POA: Diagnosis not present

## 2016-07-25 DIAGNOSIS — Z780 Asymptomatic menopausal state: Secondary | ICD-10-CM | POA: Diagnosis not present

## 2016-07-25 DIAGNOSIS — R791 Abnormal coagulation profile: Secondary | ICD-10-CM | POA: Diagnosis not present

## 2016-07-25 NOTE — Progress Notes (Signed)
Office Visit Note  Patient: Dorothy Ford             Date of Birth: 1950-10-08           MRN: [id number]             PCP: Neale Burly, MD Referring: Neale Burly, MD Visit Date: 07/25/2016 Occupation: @GUAROCC @    Subjective:  Left shoulder pain.   History of Present Illness: Dorothy Ford is a 65 y.o. female with history of osteoarthritis and pseudogout. She states she's been having frequent falls which she relates to having poor balance due to sinus issues. She fell on 07/02/2016 and landed on her right forearm where she still have some bruising. She had another fall on 07/10/2016. She fell out of her bed and she has soreness on her forehead and multiple joints. She was seen in the emergency room there were no fractures. She's been seeing her chiropractor since then for lower back pain and left hip pain. She's been also having some discomfort in her left shoulder. Knee joints continue to hurt. She also has a stiffness in her hands. She has some discomfort from fibromyalgia. She rates pain on 0-10, about 10 currently.  Activities of Daily Living:  Patient reports morning stiffness for 1 hour.   Patient Reports nocturnal pain.  Difficulty dressing/grooming: Reports Difficulty climbing stairs: Reports Difficulty getting out of chair: Reports Difficulty using hands for taps, buttons, cutlery, and/or writing: Denies   Review of Systems  Constitutional: Positive for fatigue and weakness. Negative for night sweats, weight gain and weight loss.  HENT: Negative for mouth sores, trouble swallowing, trouble swallowing, mouth dryness and nose dryness.   Eyes: Negative for pain, redness, visual disturbance and dryness.  Respiratory: Positive for shortness of breath (With exertion). Negative for cough and difficulty breathing.   Cardiovascular: Positive for hypertension. Negative for chest pain, palpitations, irregular heartbeat and swelling in legs/feet.  Gastrointestinal:  Negative for blood in stool, constipation and diarrhea.  Endocrine: Negative for increased urination.  Genitourinary: Negative for vaginal dryness.  Musculoskeletal: Positive for arthralgias, joint pain, myalgias, morning stiffness, muscle tenderness and myalgias. Negative for joint swelling and muscle weakness.  Skin: Negative for color change, rash, hair loss, skin tightness, ulcers and sensitivity to sunlight.  Allergic/Immunologic: Negative for susceptible to infections.  Neurological: Negative for dizziness, memory loss and night sweats.  Hematological: Negative for swollen glands.  Psychiatric/Behavioral: Positive for depressed mood and sleep disturbance. The patient is not nervous/anxious.     PMFS History:  Patient Active Problem List   Diagnosis Date Noted  . Osteoarthritis of both hands 07/24/2016  . DDD (degenerative disc disease), cervical 07/24/2016  . DDD (degenerative disc disease), lumbar 07/24/2016  . Osteoporosis 07/24/2016  . Glaucoma 07/24/2016  . Sleep apnea 07/24/2016  . Pseudogout 07/19/2016  . Primary osteoarthritis of both knees 07/19/2016  . Fibromyalgia 07/19/2016  . 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 Pulmonary embolism ([place]) 01/18/2012  . Anxiety 01/18/2012  . Syncope   . RHEUMATOID LUNG 12/30/2009  . OBESITY, UNSPECIFIED 06/02/2009  . DEPRESSIVE DISORDER NOT ELSEWHERE CLASSIFIED 06/02/2009  . SUPRAVENTRICULAR TACHYCARDIA 06/02/2009  . Chronic diastolic heart failure ([place]) 06/02/2009  . COPD 06/02/2009  . DIAPHRAGMAT HERN W/O MENTION OBSTRUCTION/GANGREN 06/02/2009  . PALPITATIONS 06/02/2009  . CHEST PAIN UNSPECIFIED 06/02/2009    Past Medical History:  Diagnosis Date  . Acid reflux   . Asthma   . Cardiac dysrhythmia, unspecified   . Carotid bruit  Bilateral  . Cataract   . Chest pain, unspecified   . Chronic airway obstruction, not elsewhere classified   . Chronic sinusitis   . Congestive heart failure, unspecified   . DDD (degenerative disc  disease), cervical 07/24/2016  . DDD (degenerative disc disease), lumbar 07/24/2016  . Depressive disorder, not elsewhere classified   . 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 Edema    left leg  . Fibromyalgia   . High cholesterol   . History of rheumatoid arthritis   . Obesity, unspecified   . Obstructive lung disease (generalized) ([place])   . Osteoarthritis of both hands 07/24/2016  . Osteoporosis 07/24/2016  . Palpitations   . Pseudogout involving multiple joints   . SVT (supraventricular tachycardia) ([place])   . Syncope    admitted 09/2011  . Thyroid disease   . Unspecified essential hypertension     Family History  Problem Relation Age of Onset  . Cancer Mother   . Heart disease Mother   . Stroke     Past Surgical History:  Procedure Laterality Date  . CARPAL TUNNEL RELEASE    . CATHETER ABLATION  02/1997   @ [place]     Social History   Social History Narrative  . No narrative on file     Objective: Vital Signs: BP (!) 142/84 (BP Location: Left Arm, Patient Position: Sitting, Cuff Size: Large)   Pulse 80   Resp 15   Ht 5' (1.524 m)   Wt 194 lb (88 kg)   BMI 37.89 kg/m    Physical Exam  Constitutional: She is oriented to person, place, and time. She appears well-developed and well-nourished.  HENT:  Head: Normocephalic and atraumatic.  Eyes: Conjunctivae and EOM are normal.  Neck: Normal range of motion.  Cardiovascular: Normal rate, regular rhythm, normal heart sounds and intact distal pulses.   Pulmonary/Chest: Effort normal and breath sounds normal.  Abdominal: Soft. Bowel sounds are normal.  Lymphadenopathy:    She has no cervical adenopathy.  Neurological: She is alert and oriented to person, place, and time.  Skin: Skin is warm and dry. Capillary refill takes less than 2 seconds.  Psychiatric: She has a normal mood and affect. Her behavior is normal.  Nursing note and vitals reviewed.     Musculoskeletal Exam: She has some limitation with range of motion of her C-spine and lumbar spine due to underlying disc disease. She is good range of motion of her shoulders elbows wrist joints some. She has thickening of bilateral PIP/DIP joints consistent with osteoarthritis. She is good range of motion of her hip joints. She is some crepitus with range of motion of her knee joints without any warmth swelling or effusion ankles and MTPs are good range of motion. No synovitis was noted on examination fibromyalgia tender points with 10 out of 18 positive.  CDAI Exam: No CDAI exam completed.    Investigation: Findings:  04/24/16 UDS negative    Imaging: No results found.  Speciality Comments: No specialty comments available.    Procedures:  No procedures performed Allergies: Adhesive [tape] and Codeine   Assessment / Plan:     Visit Diagnoses: Chondrocalcinosis: She has had no recent flares. She takes  colchicine 1 every day. I discussed taking it on when necessary basis only.  Primary osteoarthritis of both hands: She has severe osteoarthritis in her hands, joint protection and muscle strengthening was discussed.  Primary osteoarthritis of both knees -  Bilateral moderate medial compartment narrowing, bilateral severe chondromalacia patella, chondrocalcinosis: Weight loss diet and exercise was discussed. Which is difficult for her due to underlying congestive heart failure and shortness of breath.  DDD (degenerative disc disease), cervical: She continues to have some stiffness  DDD (degenerative disc disease), lumbar: For lower back pain she will be seeing chiropractor for right now.  Fibromyalgia : She has generalized pain and discomfort and positive tender points.  She complains of frequent falls which she relates to sinus issues.  She has following medical problems for which she is seeing by the physicians:  Osteoporosis, unspecified osteoporosis type, unspecified  pathological fracture presence  Glaucoma, unspecified glaucoma type, unspecified laterality  Idiopathic sleep related nonobstructive alveolar hypoventilation    Orders: No orders of the defined types were placed in this encounter.  No orders of the defined types were placed in this encounter.   Face-to-face time spent with patient was 30 miinutes. 50% of time was spent in counseling and coordination of care.  Follow-Up Instructions: Return in about 6 months (around 01/22/2017) for Osteoarthritis.   Bo Merino, MD

## 2016-07-26 DIAGNOSIS — E876 Hypokalemia: Secondary | ICD-10-CM | POA: Diagnosis not present

## 2016-07-26 DIAGNOSIS — R791 Abnormal coagulation profile: Secondary | ICD-10-CM | POA: Diagnosis not present

## 2016-07-26 DIAGNOSIS — M1049 Other secondary gout, multiple sites: Secondary | ICD-10-CM | POA: Diagnosis not present

## 2016-07-26 DIAGNOSIS — R310 Gross hematuria: Secondary | ICD-10-CM | POA: Diagnosis not present

## 2016-07-26 DIAGNOSIS — N390 Urinary tract infection, site not specified: Secondary | ICD-10-CM | POA: Diagnosis not present

## 2016-07-27 ENCOUNTER — Other Ambulatory Visit: Payer: Self-pay | Admitting: Cardiovascular Disease

## 2016-07-31 DIAGNOSIS — J4541 Moderate persistent asthma with (acute) exacerbation: Secondary | ICD-10-CM | POA: Diagnosis not present

## 2016-07-31 DIAGNOSIS — H5202 Hypermetropia, left eye: Secondary | ICD-10-CM | POA: Diagnosis not present

## 2016-07-31 DIAGNOSIS — H52223 Regular astigmatism, bilateral: Secondary | ICD-10-CM | POA: Diagnosis not present

## 2016-07-31 DIAGNOSIS — H25813 Combined forms of age-related cataract, bilateral: Secondary | ICD-10-CM | POA: Diagnosis not present

## 2016-07-31 DIAGNOSIS — I10 Essential (primary) hypertension: Secondary | ICD-10-CM | POA: Diagnosis not present

## 2016-07-31 DIAGNOSIS — I5032 Chronic diastolic (congestive) heart failure: Secondary | ICD-10-CM | POA: Diagnosis not present

## 2016-07-31 DIAGNOSIS — E119 Type 2 diabetes mellitus without complications: Secondary | ICD-10-CM | POA: Diagnosis not present

## 2016-07-31 DIAGNOSIS — M797 Fibromyalgia: Secondary | ICD-10-CM | POA: Diagnosis not present

## 2016-08-02 DIAGNOSIS — N938 Other specified abnormal uterine and vaginal bleeding: Secondary | ICD-10-CM | POA: Diagnosis not present

## 2016-08-02 DIAGNOSIS — R310 Gross hematuria: Secondary | ICD-10-CM | POA: Diagnosis not present

## 2016-08-03 DIAGNOSIS — Z0100 Encounter for examination of eyes and vision without abnormal findings: Secondary | ICD-10-CM | POA: Diagnosis not present

## 2016-08-08 ENCOUNTER — Other Ambulatory Visit: Payer: Self-pay | Admitting: Rheumatology

## 2016-08-08 DIAGNOSIS — Z7901 Long term (current) use of anticoagulants: Secondary | ICD-10-CM | POA: Diagnosis not present

## 2016-08-09 NOTE — Telephone Encounter (Signed)
Last Visit: 01/03/16 Next Visit: 01/22/17 Labs: 01/04/16 WNL  Okay to refill Colchicine?

## 2016-08-09 NOTE — Telephone Encounter (Signed)
ok 

## 2016-08-21 DIAGNOSIS — Z01419 Encounter for gynecological examination (general) (routine) without abnormal findings: Secondary | ICD-10-CM | POA: Diagnosis not present

## 2016-08-21 DIAGNOSIS — Z1239 Encounter for other screening for malignant neoplasm of breast: Secondary | ICD-10-CM | POA: Diagnosis not present

## 2016-08-21 DIAGNOSIS — Z6835 Body mass index (BMI) 35.0-35.9, adult: Secondary | ICD-10-CM | POA: Diagnosis not present

## 2016-08-22 DIAGNOSIS — Z7901 Long term (current) use of anticoagulants: Secondary | ICD-10-CM | POA: Diagnosis not present

## 2016-08-23 DIAGNOSIS — Z1231 Encounter for screening mammogram for malignant neoplasm of breast: Secondary | ICD-10-CM | POA: Diagnosis not present

## 2016-09-05 ENCOUNTER — Other Ambulatory Visit: Payer: Self-pay | Admitting: Cardiovascular Disease

## 2016-09-07 DIAGNOSIS — Z7901 Long term (current) use of anticoagulants: Secondary | ICD-10-CM | POA: Diagnosis not present

## 2016-09-13 DIAGNOSIS — J4541 Moderate persistent asthma with (acute) exacerbation: Secondary | ICD-10-CM | POA: Diagnosis not present

## 2016-09-13 DIAGNOSIS — I5032 Chronic diastolic (congestive) heart failure: Secondary | ICD-10-CM | POA: Diagnosis not present

## 2016-09-13 DIAGNOSIS — M797 Fibromyalgia: Secondary | ICD-10-CM | POA: Diagnosis not present

## 2016-09-13 DIAGNOSIS — I10 Essential (primary) hypertension: Secondary | ICD-10-CM | POA: Diagnosis not present

## 2016-09-14 ENCOUNTER — Telehealth: Payer: Self-pay | Admitting: Rheumatology

## 2016-09-14 NOTE — Telephone Encounter (Signed)
Patient would like to know if we received a letter from her insurance company she had faxed about a gout medication? Please call patient.

## 2016-09-18 NOTE — Telephone Encounter (Signed)
Patient advised we received her fax and that we would get her prescription taken care of.

## 2016-09-25 ENCOUNTER — Other Ambulatory Visit: Payer: Self-pay | Admitting: Rheumatology

## 2016-09-25 ENCOUNTER — Ambulatory Visit: Payer: Medicare Other | Admitting: Cardiovascular Disease

## 2016-09-25 MED ORDER — COLCRYS 0.6 MG PO TABS: 0.6000 mg | ORAL_TABLET | Freq: Every day | ORAL | 2 refills | Status: DC

## 2016-09-28 DIAGNOSIS — Z7901 Long term (current) use of anticoagulants: Secondary | ICD-10-CM | POA: Diagnosis not present

## 2016-10-02 DIAGNOSIS — E038 Other specified hypothyroidism: Secondary | ICD-10-CM | POA: Diagnosis not present

## 2016-10-02 DIAGNOSIS — Z0000 Encounter for general adult medical examination without abnormal findings: Secondary | ICD-10-CM | POA: Diagnosis not present

## 2016-10-02 DIAGNOSIS — E784 Other hyperlipidemia: Secondary | ICD-10-CM | POA: Diagnosis not present

## 2016-10-02 DIAGNOSIS — Z6835 Body mass index (BMI) 35.0-35.9, adult: Secondary | ICD-10-CM | POA: Diagnosis not present

## 2016-10-02 DIAGNOSIS — I10 Essential (primary) hypertension: Secondary | ICD-10-CM | POA: Diagnosis not present

## 2016-10-02 DIAGNOSIS — J4530 Mild persistent asthma, uncomplicated: Secondary | ICD-10-CM | POA: Diagnosis not present

## 2016-10-02 DIAGNOSIS — Z1389 Encounter for screening for other disorder: Secondary | ICD-10-CM | POA: Diagnosis not present

## 2016-10-08 ENCOUNTER — Encounter: Payer: Self-pay | Admitting: *Deleted

## 2016-10-09 ENCOUNTER — Encounter: Payer: Self-pay | Admitting: *Deleted

## 2016-10-09 ENCOUNTER — Encounter: Payer: Self-pay | Admitting: Cardiovascular Disease

## 2016-10-09 ENCOUNTER — Ambulatory Visit (INDEPENDENT_AMBULATORY_CARE_PROVIDER_SITE_OTHER): Payer: Medicare HMO | Admitting: Cardiovascular Disease

## 2016-10-09 VITALS — BP 110/74 | HR 82 | Ht 61.0 in | Wt 185.6 lb

## 2016-10-09 DIAGNOSIS — I471 Supraventricular tachycardia: Secondary | ICD-10-CM

## 2016-10-09 DIAGNOSIS — R9431 Abnormal electrocardiogram [ECG] [EKG]: Secondary | ICD-10-CM | POA: Diagnosis not present

## 2016-10-09 DIAGNOSIS — I10 Essential (primary) hypertension: Secondary | ICD-10-CM

## 2016-10-09 DIAGNOSIS — R0609 Other forms of dyspnea: Secondary | ICD-10-CM | POA: Diagnosis not present

## 2016-10-09 DIAGNOSIS — R002 Palpitations: Secondary | ICD-10-CM

## 2016-10-09 DIAGNOSIS — I5032 Chronic diastolic (congestive) heart failure: Secondary | ICD-10-CM

## 2016-10-09 NOTE — Patient Instructions (Signed)
Medication Instructions:  Continue all current medications.  Labwork: none  Testing/Procedures:  Your physician has requested that you have a lexiscan myoview. For further information please visit [email or web address]. Please follow instruction sheet, as given.  Office will contact with results via phone or letter.    Follow-Up: 2 months   Any Other Special Instructions Will Be Listed Below (If Applicable).  If you need a refill on your cardiac medications before your next appointment, please call your pharmacy.

## 2016-10-09 NOTE — Progress Notes (Signed)
SUBJECTIVE: The patient presents for routine annual follow-up. She was hospitalized in December 2016 at [facility] for severe bradycardia secondary to taking an extra dose of diltiazem unintentionally. She took both immediate release and sustained release Cardizem. Heart rate was 47 bpm.  She has a history of PSVT, sinus tachycardia with palpitations, HTN, chronic diastolic heart failure, COPD, and pulmonary embolism. Has issues with anxiety and panic attacks from time to time.  Recently evaluated for a fall in the ED in October 2017.  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 She has chronic exertional dyspnea which has gotten worse over the past several years and especially when climbing stairs. She denies chest tightness. She underwent a normal nuclear stress test in January 2013. She had some right shoulder blade pain last night which was new for her. Last week she had some left calf pain. She again complains of sinus congestion and says she has these problems year around. She sees a pulmonologist in [place].  She said "I have small lungs and they don't expand ".  ECG performed in our office today demonstrates normal sinus rhythm with late R-wave transition and a nonspecific T wave abnormality.   Review of Systems: As per "subjective", otherwise negative.  Allergies  Allergen Reactions  . Adhesive [Tape] Rash  . Codeine Nausea And Vomiting    REACTION: vomiting    Current Outpatient Prescriptions  Medication Sig Dispense Refill  . albuterol (PROVENTIL HFA;VENTOLIN HFA) 108 (90 Base) MCG/ACT inhaler Inhale into the lungs every 6 (six) hours as needed for wheezing or shortness of breath.    Marland Kitchen amLODipine (NORVASC) 5 MG tablet Take 5 mg by mouth daily.    Marland Kitchen atorvastatin (LIPITOR) 40 MG tablet Take 40 mg by mouth daily.    . clonazePAM (KLONOPIN) 0.5 MG tablet Take 0.5 mg by mouth at bedtime.     Marland Kitchen COLCRYS 0.6 MG tablet Take 1 tablet  (0.6 mg total) by mouth daily. 30 tablet 2  . cycloSPORINE (RESTASIS) 0.05 % ophthalmic emulsion Place 1 drop into both eyes 2 (two) times daily.    . DULoxetine (CYMBALTA) 30 MG capsule Take 60 mg by mouth 2 (two) times daily.     . fluticasone (FLONASE) 50 MCG/ACT nasal spray Place 1 spray into the nose daily.     . furosemide (LASIX) 40 MG tablet TAKE 1 TABLET ONCE DAILY. 30 tablet 0  . guaiFENesin (MUCINEX) 600 MG 12 hr tablet Take 600 mg by mouth 2 (two) times daily as needed.    . hydrOXYzine (VISTARIL) 25 MG capsule Take 25 mg by mouth 3 (three) times daily as needed.     Marland Kitchen ipratropium-albuterol (DUONEB) 0.5-2.5 (3) MG/3ML SOLN Take 3 mLs by nebulization.    Marland Kitchen levalbuterol (XOPENEX HFA) 45 MCG/ACT inhaler Inhale 2 puffs into the lungs every 6 (six) hours as needed for wheezing.    Marland Kitchen levocetirizine (XYZAL) 5 MG tablet Take 5 mg by mouth every evening.     Marland Kitchen levothyroxine (SYNTHROID, LEVOTHROID) 50 MCG tablet Take 50 mcg by mouth daily before breakfast.     . loperamide (IMODIUM A-D) 2 MG tablet Take 2 mg by mouth 4 (four) times daily as needed for diarrhea or loose stools.    . metoprolol succinate (TOPROL-XL) 25 MG 24 hr tablet Take 1 tablet (25 mg total) by mouth daily.    . montelukast (SINGULAIR) 10 MG tablet Take 1 tablet by mouth  daily.    . nystatin (MYCOSTATIN) powder Apply topically as needed.    . ondansetron (ZOFRAN) 4 MG tablet     . PARoxetine (PAXIL) 30 MG tablet Take 30 mg by mouth daily.    . potassium chloride (K-DUR) 10 MEQ tablet Take 2 tablets by mouth daily.    . predniSONE (DELTASONE) 10 MG tablet Take 10 mg by mouth 3 (three) times daily.     . ranitidine (ZANTAC) 300 MG tablet Take 300 mg by mouth at bedtime.    . Simethicone (GAS-X EXTRA STRENGTH) 125 MG CAPS Take 2 capsules by mouth as needed.    . SYMBICORT 160-4.5 MCG/ACT inhaler Inhale 2 puffs into the lungs 2 (two) times daily.    Marland Kitchen tiZANidine (ZANAFLEX) 4 MG tablet Take 4 mg by mouth as needed.    .  traMADol (ULTRAM) 50 MG tablet Take 50-100 mg by mouth 2 (two) times daily.    Marland Kitchen warfarin (COUMADIN) 5 MG tablet Take 5 mg by mouth daily. Managed by [place] office  2.5mg  Monday thru Friday. 5 mg on Saturday and Sunday.     No current facility-administered medications for this visit.     Past Medical History:  Diagnosis Date  . Acid reflux   . Asthma   . Cardiac dysrhythmia, unspecified   . Carotid bruit    Bilateral  . Cataract   . Chest pain, unspecified   . Chronic airway obstruction, not elsewhere classified   . Chronic sinusitis   . Congestive heart failure, unspecified   . DDD (degenerative disc disease), cervical 07/24/2016  . DDD (degenerative disc disease), lumbar 07/24/2016  . Depressive disorder, not elsewhere classified   . 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 Edema    left leg  . Fibromyalgia   . High cholesterol   . History of rheumatoid arthritis   . Obesity, unspecified   . Obstructive lung disease (generalized) ([place])   . Osteoarthritis of both hands 07/24/2016  . Osteoporosis 07/24/2016  . Palpitations   . Pseudogout involving multiple joints   . SVT (supraventricular tachycardia) ([place])   . Syncope    admitted 09/2011  . Thyroid disease   . Unspecified essential hypertension     Past Surgical History:  Procedure Laterality Date  . CARPAL TUNNEL RELEASE    . CATHETER ABLATION  02/1997   @ [place]      Social History   Social History  . Marital status: Single    Spouse name: N/A  . Number of children: N/A  . Years of education: N/A   Occupational History  . Not on file.   Social History Main Topics  . Smoking status: Never Smoker  . Smokeless tobacco: Never Used  . Alcohol use No  . Drug use: No  . Sexual activity: Not on file   Other Topics Concern  . Not on file   Social History Narrative  . No narrative on file     Vitals:   10/09/16 1123  BP: 110/74  Pulse: 82  SpO2:  98%  Weight: 185 lb 9.6 oz (84.2 kg)  Height: 5\' 1"  (1.549 m)    PHYSICAL EXAM General: NAD HEENT: Normal. Neck: No JVD, no thyromegaly. Lungs: Clear to auscultation bilaterally with normal respiratory effort. CV: Nondisplaced PMI. Regular rate and rhythm, normal S1/S2, no S3/S4, no murmur. Trivial pretibial and periankle edema. Bilateral venous varicosities. Abdomen: Soft, nontender, obese, no distention.  Neurologic:  Alert and oriented x 3.  Psych: Somewhat flat affect. Skin: Normal. Musculoskeletal: No gross deformities. Extremities: No clubbing or cyanosis.     ECG: Most recent ECG reviewed.      ASSESSMENT AND PLAN:  Palpitations/Sinus Tachycardia/History of PSVT Stable on long-acting metoprolol. Not bradycardic. She apparently had a previous RF ablation procedure in 1998 at [facility] which was unsuccessful.   Chronic diastolic heart failure  Euvolemic by history and exam. BP well controlled. No changes.  Pulmonary embolism  On chronic anticoagulation with warfarin, followed by primary M.D.   Essential HTN Controlled. No changes.  Progressive exertional dyspnea/fatigue/abnormal ECG Normal nuclear stress test in June 2013. I will repeat a Lexiscan Myoview stress test to see if there is an ischemic component to her symptoms.  Dispo: f/u 2 months.  Kate Sable, M.D., F.A.C.C.

## 2016-10-15 ENCOUNTER — Encounter (HOSPITAL_COMMUNITY): Payer: Self-pay

## 2016-10-15 ENCOUNTER — Encounter (HOSPITAL_COMMUNITY)
Admission: RE | Admit: 2016-10-15 | Discharge: 2016-10-15 | Disposition: A | Discharge status: Home / Self Care | Payer: Medicare HMO | Source: Ambulatory Visit | Attending: Cardiovascular Disease | Admitting: Cardiovascular Disease

## 2016-10-15 ENCOUNTER — Inpatient Hospital Stay (HOSPITAL_COMMUNITY): Admission: RE | Admit: 2016-10-15 | Payer: Medicare HMO | Source: Ambulatory Visit

## 2016-10-15 DIAGNOSIS — R9431 Abnormal electrocardiogram [ECG] [EKG]: Secondary | ICD-10-CM | POA: Insufficient documentation

## 2016-10-15 DIAGNOSIS — R0609 Other forms of dyspnea: Secondary | ICD-10-CM | POA: Insufficient documentation

## 2016-10-15 LAB — NM MYOCAR MULTI W/SPECT W/WALL MOTION / EF
CHL CUP NUCLEAR SDS: 3
CHL CUP NUCLEAR SSS: 4
LHR: 0.2
LVDIAVOL: 43 mL (ref 46–106)
LVSYSVOL: 12 mL
NUC STRESS TID: 0.98
Peak HR: 81 {beats}/min
Rest HR: 66 {beats}/min
SRS: 2

## 2016-10-15 MED ORDER — SODIUM CHLORIDE 0.9% FLUSH
INTRAVENOUS | Status: AC
Start: 2016-10-15 — End: 2016-10-15
  Administered 2016-10-15: 10 mL via INTRAVENOUS
  Filled 2016-10-15: qty 10

## 2016-10-15 MED ORDER — REGADENOSON 0.4 MG/5ML IV SOLN
INTRAVENOUS | Status: AC
  Administered 2016-10-15: 0.4 mg via INTRAVENOUS
  Filled 2016-10-15: qty 5

## 2016-10-15 MED ORDER — TECHNETIUM TC 99M TETROFOSMIN IV KIT
10.0000 | PACK | Freq: Once | INTRAVENOUS | Status: AC | PRN
  Administered 2016-10-15: 11 via INTRAVENOUS

## 2016-10-15 MED ORDER — TECHNETIUM TC 99M TETROFOSMIN IV KIT
30.0000 | PACK | Freq: Once | INTRAVENOUS | Status: AC | PRN
  Administered 2016-10-15: 32 via INTRAVENOUS

## 2016-10-17 ENCOUNTER — Telehealth: Payer: Self-pay | Admitting: *Deleted

## 2016-10-17 ENCOUNTER — Other Ambulatory Visit: Payer: Self-pay | Admitting: Cardiovascular Disease

## 2016-10-17 ENCOUNTER — Other Ambulatory Visit: Payer: Self-pay | Admitting: Rheumatology

## 2016-10-17 NOTE — Telephone Encounter (Signed)
Last Visit: 01/03/16 Next Visit: 01/22/17 Labs: 01/04/16 WNL  Okay to refill Colchicine?

## 2016-10-17 NOTE — Telephone Encounter (Signed)
Notes Recorded by Laurine Blazer, LPN on 075-GRM at 075-GRM PM EST Patient notified. Copy to pmd. Follow up scheduled for April. ------  Notes Recorded by Laurine Blazer, LPN on [id number] at D34-534 PM EST No answer.  ------  Notes Recorded by Herminio Commons, MD on 10/15/2016 at 1:10 PM EST

## 2016-10-18 DIAGNOSIS — J45909 Unspecified asthma, uncomplicated: Secondary | ICD-10-CM | POA: Diagnosis not present

## 2016-10-18 DIAGNOSIS — Z87891 Personal history of nicotine dependence: Secondary | ICD-10-CM | POA: Diagnosis not present

## 2016-10-18 DIAGNOSIS — J301 Allergic rhinitis due to pollen: Secondary | ICD-10-CM | POA: Diagnosis not present

## 2016-10-19 DIAGNOSIS — Z7901 Long term (current) use of anticoagulants: Secondary | ICD-10-CM | POA: Diagnosis not present

## 2016-10-22 DIAGNOSIS — I10 Essential (primary) hypertension: Secondary | ICD-10-CM | POA: Diagnosis not present

## 2016-10-22 DIAGNOSIS — M9903 Segmental and somatic dysfunction of lumbar region: Secondary | ICD-10-CM | POA: Diagnosis not present

## 2016-10-22 DIAGNOSIS — M545 Low back pain: Secondary | ICD-10-CM | POA: Diagnosis not present

## 2016-10-22 DIAGNOSIS — M9902 Segmental and somatic dysfunction of thoracic region: Secondary | ICD-10-CM | POA: Diagnosis not present

## 2016-10-22 DIAGNOSIS — M546 Pain in thoracic spine: Secondary | ICD-10-CM | POA: Diagnosis not present

## 2016-10-22 DIAGNOSIS — M542 Cervicalgia: Secondary | ICD-10-CM | POA: Diagnosis not present

## 2016-10-22 DIAGNOSIS — M9901 Segmental and somatic dysfunction of cervical region: Secondary | ICD-10-CM | POA: Diagnosis not present

## 2016-10-29 DIAGNOSIS — M545 Low back pain: Secondary | ICD-10-CM | POA: Diagnosis not present

## 2016-10-29 DIAGNOSIS — M9901 Segmental and somatic dysfunction of cervical region: Secondary | ICD-10-CM | POA: Diagnosis not present

## 2016-10-29 DIAGNOSIS — M9902 Segmental and somatic dysfunction of thoracic region: Secondary | ICD-10-CM | POA: Diagnosis not present

## 2016-10-29 DIAGNOSIS — M546 Pain in thoracic spine: Secondary | ICD-10-CM | POA: Diagnosis not present

## 2016-10-29 DIAGNOSIS — M542 Cervicalgia: Secondary | ICD-10-CM | POA: Diagnosis not present

## 2016-10-29 DIAGNOSIS — M9903 Segmental and somatic dysfunction of lumbar region: Secondary | ICD-10-CM | POA: Diagnosis not present

## 2016-11-01 DIAGNOSIS — Z7901 Long term (current) use of anticoagulants: Secondary | ICD-10-CM | POA: Diagnosis not present

## 2016-11-15 DIAGNOSIS — Z7901 Long term (current) use of anticoagulants: Secondary | ICD-10-CM | POA: Diagnosis not present

## 2016-11-19 DIAGNOSIS — I5032 Chronic diastolic (congestive) heart failure: Secondary | ICD-10-CM | POA: Diagnosis not present

## 2016-11-19 DIAGNOSIS — M797 Fibromyalgia: Secondary | ICD-10-CM | POA: Diagnosis not present

## 2016-11-19 DIAGNOSIS — I10 Essential (primary) hypertension: Secondary | ICD-10-CM | POA: Diagnosis not present

## 2016-11-19 DIAGNOSIS — J4541 Moderate persistent asthma with (acute) exacerbation: Secondary | ICD-10-CM | POA: Diagnosis not present

## 2016-11-29 DIAGNOSIS — Z7901 Long term (current) use of anticoagulants: Secondary | ICD-10-CM | POA: Diagnosis not present

## 2016-12-05 DIAGNOSIS — R51 Headache: Secondary | ICD-10-CM | POA: Diagnosis not present

## 2016-12-05 DIAGNOSIS — E039 Hypothyroidism, unspecified: Secondary | ICD-10-CM | POA: Diagnosis not present

## 2016-12-05 DIAGNOSIS — Z79899 Other long term (current) drug therapy: Secondary | ICD-10-CM | POA: Diagnosis not present

## 2016-12-05 DIAGNOSIS — J449 Chronic obstructive pulmonary disease, unspecified: Secondary | ICD-10-CM | POA: Diagnosis not present

## 2016-12-05 DIAGNOSIS — W01190A Fall on same level from slipping, tripping and stumbling with subsequent striking against furniture, initial encounter: Secondary | ICD-10-CM | POA: Diagnosis not present

## 2016-12-05 DIAGNOSIS — Z7901 Long term (current) use of anticoagulants: Secondary | ICD-10-CM | POA: Diagnosis not present

## 2016-12-05 DIAGNOSIS — Z7951 Long term (current) use of inhaled steroids: Secondary | ICD-10-CM | POA: Diagnosis not present

## 2016-12-05 DIAGNOSIS — E7800 Pure hypercholesterolemia, unspecified: Secondary | ICD-10-CM | POA: Diagnosis not present

## 2016-12-05 DIAGNOSIS — R42 Dizziness and giddiness: Secondary | ICD-10-CM | POA: Diagnosis not present

## 2016-12-05 DIAGNOSIS — R404 Transient alteration of awareness: Secondary | ICD-10-CM | POA: Diagnosis not present

## 2016-12-05 DIAGNOSIS — G4489 Other headache syndrome: Secondary | ICD-10-CM | POA: Diagnosis not present

## 2016-12-05 DIAGNOSIS — S0990XA Unspecified injury of head, initial encounter: Secondary | ICD-10-CM | POA: Diagnosis not present

## 2016-12-05 DIAGNOSIS — M797 Fibromyalgia: Secondary | ICD-10-CM | POA: Diagnosis not present

## 2016-12-05 DIAGNOSIS — I10 Essential (primary) hypertension: Secondary | ICD-10-CM | POA: Diagnosis not present

## 2016-12-07 DIAGNOSIS — S40011A Contusion of right shoulder, initial encounter: Secondary | ICD-10-CM | POA: Diagnosis not present

## 2016-12-07 DIAGNOSIS — Z6834 Body mass index (BMI) 34.0-34.9, adult: Secondary | ICD-10-CM | POA: Diagnosis not present

## 2016-12-18 ENCOUNTER — Ambulatory Visit (INDEPENDENT_AMBULATORY_CARE_PROVIDER_SITE_OTHER): Payer: Medicare HMO | Admitting: Cardiovascular Disease

## 2016-12-18 ENCOUNTER — Encounter: Payer: Self-pay | Admitting: Cardiovascular Disease

## 2016-12-18 VITALS — BP 128/90 | HR 88 | Ht 61.0 in | Wt 185.0 lb

## 2016-12-18 DIAGNOSIS — M5136 Other intervertebral disc degeneration, lumbar region: Secondary | ICD-10-CM | POA: Diagnosis not present

## 2016-12-18 DIAGNOSIS — R002 Palpitations: Secondary | ICD-10-CM | POA: Diagnosis not present

## 2016-12-18 DIAGNOSIS — I5032 Chronic diastolic (congestive) heart failure: Secondary | ICD-10-CM | POA: Diagnosis not present

## 2016-12-18 DIAGNOSIS — R0609 Other forms of dyspnea: Secondary | ICD-10-CM

## 2016-12-18 DIAGNOSIS — I2782 Chronic pulmonary embolism: Secondary | ICD-10-CM

## 2016-12-18 DIAGNOSIS — M48061 Spinal stenosis, lumbar region without neurogenic claudication: Secondary | ICD-10-CM | POA: Diagnosis not present

## 2016-12-18 DIAGNOSIS — I471 Supraventricular tachycardia: Secondary | ICD-10-CM | POA: Diagnosis not present

## 2016-12-18 DIAGNOSIS — M47814 Spondylosis without myelopathy or radiculopathy, thoracic region: Secondary | ICD-10-CM | POA: Diagnosis not present

## 2016-12-18 DIAGNOSIS — I10 Essential (primary) hypertension: Secondary | ICD-10-CM | POA: Diagnosis not present

## 2016-12-18 DIAGNOSIS — M797 Fibromyalgia: Secondary | ICD-10-CM | POA: Diagnosis not present

## 2016-12-18 DIAGNOSIS — J449 Chronic obstructive pulmonary disease, unspecified: Secondary | ICD-10-CM | POA: Diagnosis not present

## 2016-12-18 DIAGNOSIS — E7800 Pure hypercholesterolemia, unspecified: Secondary | ICD-10-CM | POA: Diagnosis not present

## 2016-12-18 DIAGNOSIS — E039 Hypothyroidism, unspecified: Secondary | ICD-10-CM | POA: Diagnosis not present

## 2016-12-18 DIAGNOSIS — M47816 Spondylosis without myelopathy or radiculopathy, lumbar region: Secondary | ICD-10-CM | POA: Diagnosis not present

## 2016-12-18 DIAGNOSIS — M5134 Other intervertebral disc degeneration, thoracic region: Secondary | ICD-10-CM | POA: Diagnosis not present

## 2016-12-18 NOTE — Patient Instructions (Signed)

## 2016-12-18 NOTE — Progress Notes (Signed)
SUBJECTIVE: The patient returns for follow-up after undergoing cardiovascular testing performed for the evaluation of progressive exertional dyspnea, fatigue, and abnormal ECG.  Nuclear stress test on 10/15/16 was normal, LVEF greater than 65%.  She also has a history of PSVT, sinus tachycardia with palpitations, hypertension, chronic diastolic heart failure, COPD, and pulmonary embolism. She also has anxiety with panic attacks.  She previously told me she used to work with cotton dust and chemicals and has asthmatic bronchitis with chronic exertional dyspnea which has been getting worse over the past several years.  She recently had a fall due to dizziness related to in her ear disease.  With regards to palpitations, she says they are well controlled.  With regards to chronic diastolic heart failure, she tells me her ankles are chronically swollen.   Review of Systems: As per "subjective", otherwise negative.  Allergies  Allergen Reactions  . Adhesive [Tape] Rash  . Codeine Nausea And Vomiting    REACTION: vomiting    Current Outpatient Prescriptions  Medication Sig Dispense Refill  . albuterol (PROVENTIL HFA;VENTOLIN HFA) 108 (90 Base) MCG/ACT inhaler Inhale into the lungs every 6 (six) hours as needed for wheezing or shortness of breath.    Marland Kitchen amLODipine (NORVASC) 5 MG tablet Take 5 mg by mouth daily.    Marland Kitchen atorvastatin (LIPITOR) 40 MG tablet Take 40 mg by mouth daily.    . clonazePAM (KLONOPIN) 0.5 MG tablet Take 0.5 mg by mouth at bedtime.     . colchicine 0.6 MG tablet TAKE (1) TABLET BY MOUTH ONCE DAILY. 30 tablet 3  . cycloSPORINE (RESTASIS) 0.05 % ophthalmic emulsion Place 1 drop into both eyes 2 (two) times daily.    . DULoxetine (CYMBALTA) 30 MG capsule Take 60 mg by mouth 2 (two) times daily.     . fluticasone (FLONASE) 50 MCG/ACT nasal spray Place 1 spray into the nose daily.     . furosemide (LASIX) 40 MG tablet TAKE 1 TABLET ONCE DAILY. 30 tablet 6  .  guaiFENesin (MUCINEX) 600 MG 12 hr tablet Take 600 mg by mouth 2 (two) times daily as needed.    . hydrOXYzine (VISTARIL) 25 MG capsule Take 25 mg by mouth 3 (three) times daily as needed.     Marland Kitchen ipratropium-albuterol (DUONEB) 0.5-2.5 (3) MG/3ML SOLN Take 3 mLs by nebulization.    Marland Kitchen levalbuterol (XOPENEX HFA) 45 MCG/ACT inhaler Inhale 2 puffs into the lungs every 6 (six) hours as needed for wheezing.    Marland Kitchen levocetirizine (XYZAL) 5 MG tablet Take 5 mg by mouth every evening.     Marland Kitchen levothyroxine (SYNTHROID, LEVOTHROID) 50 MCG tablet Take 50 mcg by mouth daily before breakfast.     . loperamide (IMODIUM A-D) 2 MG tablet Take 2 mg by mouth 4 (four) times daily as needed for diarrhea or loose stools.    . metoprolol succinate (TOPROL-XL) 25 MG 24 hr tablet Take 1 tablet (25 mg total) by mouth daily.    . montelukast (SINGULAIR) 10 MG tablet Take 1 tablet by mouth daily.    Marland Kitchen nystatin (MYCOSTATIN) powder Apply topically as needed.    . ondansetron (ZOFRAN) 4 MG tablet     . PARoxetine (PAXIL) 30 MG tablet Take 30 mg by mouth daily.    . potassium chloride (K-DUR) 10 MEQ tablet Take 2 tablets by mouth daily.    . predniSONE (DELTASONE) 10 MG tablet Take 10 mg by mouth 3 (three) times daily.     Marland Kitchen  ranitidine (ZANTAC) 300 MG tablet Take 300 mg by mouth at bedtime.    . Sennosides (SENOKOT PO) Take by mouth.    . Simethicone (GAS-X EXTRA STRENGTH) 125 MG CAPS Take 2 capsules by mouth as needed.    . SYMBICORT 160-4.5 MCG/ACT inhaler Inhale 2 puffs into the lungs 2 (two) times daily.    Marland Kitchen tiZANidine (ZANAFLEX) 4 MG tablet Take 4 mg by mouth as needed.    . traMADol (ULTRAM) 50 MG tablet Take 50-100 mg by mouth 2 (two) times daily.    Marland Kitchen warfarin (COUMADIN) 5 MG tablet Take 5 mg by mouth daily. Managed by [place] office  2.5mg  Monday thru Friday. 5 mg on Saturday and Sunday.     No current facility-administered medications for this visit.     Past Medical History:  Diagnosis Date  . Acid reflux   .  Asthma   . Cardiac dysrhythmia, unspecified   . Carotid bruit    Bilateral  . Cataract   . Chest pain, unspecified   . Chronic airway obstruction, not elsewhere classified   . Chronic sinusitis   . Congestive heart failure, unspecified   . DDD (degenerative disc disease), cervical 07/24/2016  . DDD (degenerative disc disease), lumbar 07/24/2016  . Depressive disorder, not elsewhere classified   . 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 Edema    left leg  . Fibromyalgia   . High cholesterol   . History of rheumatoid arthritis   . Obesity, unspecified   . Obstructive lung disease (generalized) ([place])   . Osteoarthritis of both hands 07/24/2016  . Osteoporosis 07/24/2016  . Palpitations   . Pseudogout involving multiple joints   . SVT (supraventricular tachycardia) ([place])   . Syncope    admitted 09/2011  . Thyroid disease   . Unspecified essential hypertension     Past Surgical History:  Procedure Laterality Date  . CARPAL TUNNEL RELEASE    . CATHETER ABLATION  02/1997   @ [place]      Social History   Social History  . Marital status: Single    Spouse name: N/A  . Number of children: N/A  . Years of education: N/A   Occupational History  . Not on file.   Social History Main Topics  . Smoking status: Never Smoker  . Smokeless tobacco: Never Used  . Alcohol use No  . Drug use: No  . Sexual activity: Not on file   Other Topics Concern  . Not on file   Social History Narrative  . No narrative on file     Vitals:   12/18/16 1059  BP: 128/90  Pulse: 88  SpO2: 96%  Weight: 185 lb (83.9 kg)  Height: 5\' 1"  (1.549 m)    Wt Readings from Last 3 Encounters:  12/18/16 185 lb (83.9 kg)  10/09/16 185 lb 9.6 oz (84.2 kg)  07/25/16 194 lb (88 kg)     PHYSICAL EXAM General: NAD HEENT: Normal. Neck: No JVD, no thyromegaly. Lungs: Clear to auscultation bilaterally with normal respiratory effort. CV:  Nondisplaced PMI.  Regular rate and rhythm, normal S1/S2, no S3/S4, no murmur. Trace b/l periankle edema.     Abdomen: Soft, nontender, obese, no distention.  Neurologic: Alert and oriented.  Psych: Normal affect. Skin: Normal. Musculoskeletal: No gross deformities.    ECG: Most recent ECG reviewed.   Labs: No results found for: K, BUN, CREATININE, ALT, TSH, HGB   Lipids:  No results found for: LDLCALC, LDLDIRECT, CHOL, TRIG, HDL     ASSESSMENT AND PLAN:  Palpitations/SinusTachycardia/History of PSVT Stable on long-acting metoprolol. She apparently had a previous radiofrequency ablation in 1998 at [facility] which was unsuccessful.  Chronic diastolic heart failure Euvolemic. No changes to therapy.  Pulmonary embolism Remains on chronic anticoagulation with warfarin.  Essential HTN Mildly elevated diastolic blood pressure. No changes at this time.  Progressive exertional dyspnea/fatigue/abnormal ECG Normal nuclear stress test as detailed above. No further cardiovascular testing is indicated at this time.    Disposition: Follow up 1 year  Kate Sable, M.D., F.A.C.C.

## 2016-12-19 DIAGNOSIS — Z7901 Long term (current) use of anticoagulants: Secondary | ICD-10-CM | POA: Diagnosis not present

## 2016-12-21 ENCOUNTER — Telehealth: Payer: Self-pay | Admitting: Radiology

## 2016-12-21 MED ORDER — TRAMADOL HCL 50 MG PO TABS: 50.0000 mg | ORAL_TABLET | Freq: Two times a day (BID) | ORAL | 0 refills | Status: DC

## 2016-12-21 NOTE — Addendum Note (Signed)
Addended byCandice Camp on: 12/21/2016 01:22 PM   Modules accepted: Orders

## 2016-12-21 NOTE — Telephone Encounter (Signed)
Refill request received via fax for Tramadol [employer]

## 2016-12-21 NOTE — Telephone Encounter (Signed)
01/22/2017 next visit Last visit 01/03/2016  UDS on 04/24/2016   She is very past due for 6 month follow up (she did have injections only with [place] in Aug 2017)  Ok to refill Tramadol ?

## 2016-12-21 NOTE — Telephone Encounter (Signed)
Ok to give 30d and sch fu appt

## 2016-12-24 DIAGNOSIS — M546 Pain in thoracic spine: Secondary | ICD-10-CM | POA: Diagnosis not present

## 2016-12-24 DIAGNOSIS — M9903 Segmental and somatic dysfunction of lumbar region: Secondary | ICD-10-CM | POA: Diagnosis not present

## 2016-12-24 DIAGNOSIS — I10 Essential (primary) hypertension: Secondary | ICD-10-CM | POA: Diagnosis not present

## 2016-12-24 DIAGNOSIS — M545 Low back pain: Secondary | ICD-10-CM | POA: Diagnosis not present

## 2016-12-24 DIAGNOSIS — M542 Cervicalgia: Secondary | ICD-10-CM | POA: Diagnosis not present

## 2016-12-24 DIAGNOSIS — M9902 Segmental and somatic dysfunction of thoracic region: Secondary | ICD-10-CM | POA: Diagnosis not present

## 2016-12-24 DIAGNOSIS — M9901 Segmental and somatic dysfunction of cervical region: Secondary | ICD-10-CM | POA: Diagnosis not present

## 2016-12-25 ENCOUNTER — Telehealth: Payer: Self-pay | Admitting: Radiology

## 2016-12-25 DIAGNOSIS — Z6835 Body mass index (BMI) 35.0-35.9, adult: Secondary | ICD-10-CM | POA: Diagnosis not present

## 2016-12-25 DIAGNOSIS — M25512 Pain in left shoulder: Secondary | ICD-10-CM | POA: Diagnosis not present

## 2016-12-25 NOTE — Telephone Encounter (Signed)
Refill request received via fax for Tramadol [employer]

## 2016-12-25 NOTE — Telephone Encounter (Signed)
This was done on 12/21/16

## 2017-01-07 DIAGNOSIS — M9902 Segmental and somatic dysfunction of thoracic region: Secondary | ICD-10-CM | POA: Diagnosis not present

## 2017-01-07 DIAGNOSIS — M546 Pain in thoracic spine: Secondary | ICD-10-CM | POA: Diagnosis not present

## 2017-01-07 DIAGNOSIS — M9901 Segmental and somatic dysfunction of cervical region: Secondary | ICD-10-CM | POA: Diagnosis not present

## 2017-01-07 DIAGNOSIS — M9903 Segmental and somatic dysfunction of lumbar region: Secondary | ICD-10-CM | POA: Diagnosis not present

## 2017-01-07 DIAGNOSIS — M542 Cervicalgia: Secondary | ICD-10-CM | POA: Diagnosis not present

## 2017-01-07 DIAGNOSIS — M545 Low back pain: Secondary | ICD-10-CM | POA: Diagnosis not present

## 2017-01-11 DIAGNOSIS — N760 Acute vaginitis: Secondary | ICD-10-CM | POA: Diagnosis not present

## 2017-01-11 DIAGNOSIS — Z6835 Body mass index (BMI) 35.0-35.9, adult: Secondary | ICD-10-CM | POA: Diagnosis not present

## 2017-01-11 DIAGNOSIS — Z7901 Long term (current) use of anticoagulants: Secondary | ICD-10-CM | POA: Diagnosis not present

## 2017-01-15 DIAGNOSIS — M2242 Chondromalacia patellae, left knee: Secondary | ICD-10-CM | POA: Insufficient documentation

## 2017-01-15 DIAGNOSIS — M2241 Chondromalacia patellae, right knee: Secondary | ICD-10-CM | POA: Insufficient documentation

## 2017-01-15 NOTE — Progress Notes (Signed)
Office Visit Note  Patient: Dorothy Ford             Date of Birth: 05/15/1951           MRN: [id number]             PCP: Neale Burly, MD Referring: Neale Burly, MD Visit Date: 01/22/2017 Occupation: @GUAROCC @    Subjective:  Joint Pain (has had hosptial stay for fall out of bed, hit head and shoulder / on Coumadin )   History of Present Illness: Dorothy Ford is a 66 y.o. female with history of pseudogout and osteoarthritis. She states she's been having pain in multiple joints. She describes pain in her bilateral wrist and hands, knee joints. She continues to have some discomfort in her C-spine and lumbar spine. She has generalized pain from fibromyalgia. She reports her pain on scale of 0-10 is about 10 with her back pain. It decreased to have fatigue.  Activities of Daily Living:  Patient reports morning stiffness for 1 minutes.   Patient Reports nocturnal pain.  Difficulty dressing/grooming: Reports Difficulty climbing stairs: Reports Difficulty getting out of chair: Reports Difficulty using hands for taps, buttons, cutlery, and/or writing: Denies   Review of Systems  Constitutional: Positive for fatigue. Negative for night sweats, weight gain, weight loss and weakness.  HENT: Positive for mouth dryness. Negative for mouth sores, trouble swallowing, trouble swallowing and nose dryness.   Eyes: Negative for pain, redness, visual disturbance and dryness.  Respiratory: Positive for shortness of breath. Negative for cough and difficulty breathing.        Persistent per patient  Cardiovascular: Negative for chest pain, palpitations, hypertension, irregular heartbeat and swelling in legs/feet.  Gastrointestinal: Negative for blood in stool, constipation and diarrhea.  Endocrine: Negative for increased urination.  Genitourinary: Negative for vaginal dryness.  Musculoskeletal: Positive for arthralgias, joint pain, myalgias and myalgias. Negative for joint swelling,  muscle weakness, morning stiffness and muscle tenderness.  Skin: Negative for color change, rash, hair loss, skin tightness, ulcers and sensitivity to sunlight.  Allergic/Immunologic: Negative for susceptible to infections.  Neurological: Negative for dizziness, memory loss and night sweats.  Hematological: Negative for swollen glands.  Psychiatric/Behavioral: Positive for depressed mood and sleep disturbance. The patient is nervous/anxious.     PMFS History:  Patient Active Problem List   Diagnosis Date Noted  . 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 Chondromalacia patellae, right knee 01/15/2017  . Osteoarthritis of both hands 07/24/2016  . DDD (degenerative disc disease), cervical 07/24/2016  . DDD (degenerative disc disease), lumbar 07/24/2016  . Osteoporosis 07/24/2016  . Glaucoma 07/24/2016  . Sleep apnea 07/24/2016  . Pseudogout 07/19/2016  . Primary osteoarthritis of both knees 07/19/2016  . Fibromyalgia 07/19/2016  . 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 Pulmonary embolism ([place]) 01/18/2012  . Anxiety 01/18/2012  . Syncope   . RHEUMATOID LUNG 12/30/2009  . OBESITY, UNSPECIFIED 06/02/2009  . DEPRESSIVE DISORDER NOT ELSEWHERE CLASSIFIED 06/02/2009  . SUPRAVENTRICULAR TACHYCARDIA 06/02/2009  . Chronic diastolic heart failure ([place]) 06/02/2009  . COPD 06/02/2009  . DIAPHRAGMAT HERN W/O MENTION OBSTRUCTION/GANGREN 06/02/2009  . PALPITATIONS 06/02/2009  . CHEST PAIN UNSPECIFIED 06/02/2009    Past Medical History:  Diagnosis Date  . Acid reflux   . Asthma   . Cardiac dysrhythmia, unspecified   . Carotid bruit    Bilateral  . Cataract   . Chest pain, unspecified   . Chronic airway obstruction, not elsewhere classified   . Chronic sinusitis   .  Congestive heart failure, unspecified   . DDD (degenerative disc disease), cervical 07/24/2016  . DDD (degenerative disc disease), lumbar 07/24/2016  . Depressive disorder, not elsewhere classified   . Diaphragmatic hernia  without mention of obstruction or gangrene   . Edema    left leg  . Fibromyalgia   . High cholesterol   . History of rheumatoid arthritis   . Obesity, unspecified   . Obstructive lung disease (generalized) ([place])   . Osteoarthritis of both hands 07/24/2016  . Osteoporosis 07/24/2016  . Palpitations   . Pseudogout involving multiple joints   . SVT (supraventricular tachycardia) ([place])   . Syncope    admitted 09/2011  . Thyroid disease   . Unspecified essential hypertension     Family History  Problem Relation Age of Onset  . Cancer Mother   . Heart disease Mother   . Stroke Unknown    Past Surgical History:  Procedure Laterality Date  . CARPAL TUNNEL RELEASE    . CATHETER ABLATION  02/1997   @ [place]     Social History   Social History Narrative  . No narrative on file     Objective: Vital Signs: BP 134/74   Pulse 78   Resp 16   Wt 180 lb (81.6 kg)   BMI 34.01 kg/m    Physical Exam  Constitutional: She is oriented to person, place, and time. She appears well-developed and well-nourished.  HENT:  Head: Normocephalic and atraumatic.  Eyes: Conjunctivae and EOM are normal.  Neck: Normal range of motion.  Cardiovascular: Normal rate, regular rhythm, normal heart sounds and intact distal pulses.   Pulmonary/Chest: Effort normal and breath sounds normal.  Abdominal: Soft. Bowel sounds are normal.  Lymphadenopathy:    She has no cervical adenopathy.  Neurological: She is alert and oriented to person, place, and time.  Skin: Skin is warm and dry. Capillary refill takes less than 2 seconds.  Psychiatric: She has a normal mood and affect. Her behavior is normal.  Nursing note and vitals reviewed.    Musculoskeletal Exam: C-spine and thoracic lumbar spine limited range of motion. Shoulder joints elbow joints wrist joints are good range of motion. She has incomplete fist formation bilaterally with  thickening of bilateral PIP/DIP joints  consistent with severe osteoarthritis. Hip joints are good range of motion. She is some discomfort range of motion of bilateral knee joints with crepitus. Fibromyalgia tender points were 18 out of 18 positive.  CDAI Exam: No CDAI exam completed.    Investigation: Findings:  01/03/2016 X-rays of bilateral knee joints showed bilateral moderate medial compartment narrowing and chondrocalcinosis.  She has severe patellofemoral narrowing consistent with moderate osteoarthritis, chondrocalcinosis and chondromalacia of the patella. 12/05/2016 UA negative, CBC normal, INR 3.5, CMP normal       Imaging: No results found.  Speciality Comments: No specialty comments available.    Procedures:  No procedures performed Allergies: Adhesive [tape] and Codeine   Assessment / Plan:     Visit Diagnoses: Pseudogout: She has not had a flare of pseudogout. She does have colchicine to be used on when necessary basis.  Primary osteoarthritis of both hands - severe: She has decreased grip strength and chronic pain. Joint protection and muscle strengthening was discussed.  Primary osteoarthritis of both knees - With chondromalacia patella: She continues to have pain and discomfort. Weight loss diet and exercise was discussed.  DDD (degenerative disc disease), cervical: She has chronic discomfort  DDD (degenerative disc  disease), lumbar: She describes severe lower back pain. Without any radiculopathy.  Fibromyalgia: She continues to have some generalized pain and discomfort for which she's been treated by her PCP she is on Cymbalta, tizanidine and tramadol.   other medical problems are listed as follows:  History of sleep apnea  History of CHF (congestive heart failure)  History of COPD  History of pulmonary embolism  History of glaucoma  Osteoporosis, unspecified osteoporosis type, unspecified pathological fracture presence    Orders: No orders of the defined types were placed in this  encounter.  No orders of the defined types were placed in this encounter.   Face-to-face time spent with patient was 30 minutes. 50% of time was spent in counseling and coordination of care.  Follow-Up Instructions: Return in about 6 months (around 07/25/2017) for CPPD, OA, DDD.   Bo Merino, MD  Note - This record has been created using Editor, commissioning.  Chart creation errors have been sought, but may not always  have been located. Such creation errors do not reflect on  the standard of medical care.

## 2017-01-18 ENCOUNTER — Other Ambulatory Visit: Payer: Self-pay | Admitting: Rheumatology

## 2017-01-21 DIAGNOSIS — M546 Pain in thoracic spine: Secondary | ICD-10-CM | POA: Diagnosis not present

## 2017-01-21 DIAGNOSIS — M9902 Segmental and somatic dysfunction of thoracic region: Secondary | ICD-10-CM | POA: Diagnosis not present

## 2017-01-21 DIAGNOSIS — M545 Low back pain: Secondary | ICD-10-CM | POA: Diagnosis not present

## 2017-01-21 DIAGNOSIS — M542 Cervicalgia: Secondary | ICD-10-CM | POA: Diagnosis not present

## 2017-01-21 DIAGNOSIS — M9903 Segmental and somatic dysfunction of lumbar region: Secondary | ICD-10-CM | POA: Diagnosis not present

## 2017-01-21 DIAGNOSIS — M9901 Segmental and somatic dysfunction of cervical region: Secondary | ICD-10-CM | POA: Diagnosis not present

## 2017-01-21 NOTE — Telephone Encounter (Signed)
ok 

## 2017-01-21 NOTE — Telephone Encounter (Signed)
Last Visit: 07/25/16 Next Visit: 01/22/17 Labs: 01/04/16 WNL  Okay to refill Colcrys?

## 2017-01-22 ENCOUNTER — Encounter: Payer: Self-pay | Admitting: Rheumatology

## 2017-01-22 ENCOUNTER — Ambulatory Visit (INDEPENDENT_AMBULATORY_CARE_PROVIDER_SITE_OTHER): Payer: Medicare HMO | Admitting: Rheumatology

## 2017-01-22 VITALS — BP 134/74 | HR 78 | Resp 16 | Wt 180.0 lb

## 2017-01-22 DIAGNOSIS — Z86711 Personal history of pulmonary embolism: Secondary | ICD-10-CM

## 2017-01-22 DIAGNOSIS — M797 Fibromyalgia: Secondary | ICD-10-CM

## 2017-01-22 DIAGNOSIS — M19041 Primary osteoarthritis, right hand: Secondary | ICD-10-CM

## 2017-01-22 DIAGNOSIS — M19042 Primary osteoarthritis, left hand: Secondary | ICD-10-CM | POA: Diagnosis not present

## 2017-01-22 DIAGNOSIS — M810 Age-related osteoporosis without current pathological fracture: Secondary | ICD-10-CM

## 2017-01-22 DIAGNOSIS — M5030 Other cervical disc degeneration, unspecified cervical region: Secondary | ICD-10-CM | POA: Diagnosis not present

## 2017-01-22 DIAGNOSIS — M170 Bilateral primary osteoarthritis of knee: Secondary | ICD-10-CM

## 2017-01-22 DIAGNOSIS — Z8709 Personal history of other diseases of the respiratory system: Secondary | ICD-10-CM | POA: Diagnosis not present

## 2017-01-22 DIAGNOSIS — Z8679 Personal history of other diseases of the circulatory system: Secondary | ICD-10-CM | POA: Diagnosis not present

## 2017-01-22 DIAGNOSIS — M1120 Other chondrocalcinosis, unspecified site: Secondary | ICD-10-CM

## 2017-01-22 DIAGNOSIS — Z8669 Personal history of other diseases of the nervous system and sense organs: Secondary | ICD-10-CM | POA: Diagnosis not present

## 2017-01-22 DIAGNOSIS — M5136 Other intervertebral disc degeneration, lumbar region: Secondary | ICD-10-CM | POA: Diagnosis not present

## 2017-01-31 DIAGNOSIS — Z7901 Long term (current) use of anticoagulants: Secondary | ICD-10-CM | POA: Diagnosis not present

## 2017-02-18 DIAGNOSIS — Z780 Asymptomatic menopausal state: Secondary | ICD-10-CM | POA: Diagnosis not present

## 2017-02-18 DIAGNOSIS — Z79899 Other long term (current) drug therapy: Secondary | ICD-10-CM | POA: Diagnosis not present

## 2017-02-18 DIAGNOSIS — M810 Age-related osteoporosis without current pathological fracture: Secondary | ICD-10-CM | POA: Diagnosis not present

## 2017-02-18 DIAGNOSIS — E039 Hypothyroidism, unspecified: Secondary | ICD-10-CM | POA: Diagnosis not present

## 2017-02-18 DIAGNOSIS — I10 Essential (primary) hypertension: Secondary | ICD-10-CM | POA: Diagnosis not present

## 2017-02-19 ENCOUNTER — Other Ambulatory Visit: Payer: Self-pay | Admitting: Rheumatology

## 2017-02-19 MED ORDER — TRAMADOL HCL 50 MG PO TABS: 50.0000 mg | ORAL_TABLET | Freq: Two times a day (BID) | ORAL | 0 refills | Status: DC

## 2017-02-19 NOTE — Telephone Encounter (Signed)
Patient request a refill on her Tramadol. [facility] uses [facility] in [place]. Please call to advise when called in.

## 2017-02-19 NOTE — Telephone Encounter (Signed)
ok 

## 2017-02-19 NOTE — Telephone Encounter (Signed)
Last Visit: 01/22/17 Next Visit: 07/22/17 UDS: 04/26/16 Narc Agreement: 03/30/16  Okay to refill Tramadol?

## 2017-02-21 DIAGNOSIS — M9902 Segmental and somatic dysfunction of thoracic region: Secondary | ICD-10-CM | POA: Diagnosis not present

## 2017-02-21 DIAGNOSIS — M542 Cervicalgia: Secondary | ICD-10-CM | POA: Diagnosis not present

## 2017-02-21 DIAGNOSIS — M546 Pain in thoracic spine: Secondary | ICD-10-CM | POA: Diagnosis not present

## 2017-02-21 DIAGNOSIS — M9903 Segmental and somatic dysfunction of lumbar region: Secondary | ICD-10-CM | POA: Diagnosis not present

## 2017-02-21 DIAGNOSIS — M9901 Segmental and somatic dysfunction of cervical region: Secondary | ICD-10-CM | POA: Diagnosis not present

## 2017-02-21 DIAGNOSIS — M545 Low back pain: Secondary | ICD-10-CM | POA: Diagnosis not present

## 2017-02-22 DIAGNOSIS — I2782 Chronic pulmonary embolism: Secondary | ICD-10-CM | POA: Diagnosis not present

## 2017-02-25 DIAGNOSIS — K219 Gastro-esophageal reflux disease without esophagitis: Secondary | ICD-10-CM | POA: Diagnosis not present

## 2017-02-25 DIAGNOSIS — J208 Acute bronchitis due to other specified organisms: Secondary | ICD-10-CM | POA: Diagnosis not present

## 2017-02-25 DIAGNOSIS — J4521 Mild intermittent asthma with (acute) exacerbation: Secondary | ICD-10-CM | POA: Diagnosis not present

## 2017-02-25 DIAGNOSIS — E038 Other specified hypothyroidism: Secondary | ICD-10-CM | POA: Diagnosis not present

## 2017-02-25 DIAGNOSIS — Z6834 Body mass index (BMI) 34.0-34.9, adult: Secondary | ICD-10-CM | POA: Diagnosis not present

## 2017-02-25 DIAGNOSIS — I10 Essential (primary) hypertension: Secondary | ICD-10-CM | POA: Diagnosis not present

## 2017-02-25 DIAGNOSIS — F411 Generalized anxiety disorder: Secondary | ICD-10-CM | POA: Diagnosis not present

## 2017-02-25 DIAGNOSIS — M545 Low back pain: Secondary | ICD-10-CM | POA: Diagnosis not present

## 2017-03-22 DIAGNOSIS — Z7901 Long term (current) use of anticoagulants: Secondary | ICD-10-CM | POA: Diagnosis not present

## 2017-03-25 DIAGNOSIS — M545 Low back pain: Secondary | ICD-10-CM | POA: Diagnosis not present

## 2017-03-25 DIAGNOSIS — M542 Cervicalgia: Secondary | ICD-10-CM | POA: Diagnosis not present

## 2017-03-25 DIAGNOSIS — M9901 Segmental and somatic dysfunction of cervical region: Secondary | ICD-10-CM | POA: Diagnosis not present

## 2017-03-25 DIAGNOSIS — M9902 Segmental and somatic dysfunction of thoracic region: Secondary | ICD-10-CM | POA: Diagnosis not present

## 2017-03-25 DIAGNOSIS — M546 Pain in thoracic spine: Secondary | ICD-10-CM | POA: Diagnosis not present

## 2017-03-25 DIAGNOSIS — M9903 Segmental and somatic dysfunction of lumbar region: Secondary | ICD-10-CM | POA: Diagnosis not present

## 2017-04-01 DIAGNOSIS — M797 Fibromyalgia: Secondary | ICD-10-CM | POA: Diagnosis not present

## 2017-04-01 DIAGNOSIS — J4541 Moderate persistent asthma with (acute) exacerbation: Secondary | ICD-10-CM | POA: Diagnosis not present

## 2017-04-01 DIAGNOSIS — I5032 Chronic diastolic (congestive) heart failure: Secondary | ICD-10-CM | POA: Diagnosis not present

## 2017-04-01 DIAGNOSIS — I10 Essential (primary) hypertension: Secondary | ICD-10-CM | POA: Diagnosis not present

## 2017-04-11 DIAGNOSIS — I2782 Chronic pulmonary embolism: Secondary | ICD-10-CM | POA: Diagnosis not present

## 2017-04-18 DIAGNOSIS — Z87891 Personal history of nicotine dependence: Secondary | ICD-10-CM | POA: Diagnosis not present

## 2017-04-18 DIAGNOSIS — J329 Chronic sinusitis, unspecified: Secondary | ICD-10-CM | POA: Diagnosis not present

## 2017-04-18 DIAGNOSIS — H9201 Otalgia, right ear: Secondary | ICD-10-CM | POA: Diagnosis not present

## 2017-04-18 DIAGNOSIS — J45909 Unspecified asthma, uncomplicated: Secondary | ICD-10-CM | POA: Diagnosis not present

## 2017-04-25 ENCOUNTER — Other Ambulatory Visit: Payer: Self-pay | Admitting: Rheumatology

## 2017-04-25 DIAGNOSIS — Z5181 Encounter for therapeutic drug level monitoring: Secondary | ICD-10-CM

## 2017-04-25 NOTE — Telephone Encounter (Signed)
Patient needs a refill on Tramadol. Pharmacy faxed request, but has not heard anything back. [facility] uses [facility] in [place].

## 2017-04-25 NOTE — Telephone Encounter (Signed)
Last Visit: 01/22/17 Next Visit: 07/22/17 UDS: 04/26/16 Narc Agreement: 03/30/16  Patient coming to office to update UDS and Narc agreement.   Okay to refill Tramadol?

## 2017-04-25 NOTE — Telephone Encounter (Signed)
Okay to refill after UDS is collected

## 2017-04-26 NOTE — Telephone Encounter (Signed)
Attempted to contact the patient and left message for patient to call the office.  

## 2017-04-30 DIAGNOSIS — R0981 Nasal congestion: Secondary | ICD-10-CM | POA: Diagnosis not present

## 2017-04-30 DIAGNOSIS — J302 Other seasonal allergic rhinitis: Secondary | ICD-10-CM | POA: Diagnosis not present

## 2017-05-01 DIAGNOSIS — Z7901 Long term (current) use of anticoagulants: Secondary | ICD-10-CM | POA: Diagnosis not present

## 2017-05-02 DIAGNOSIS — Z5181 Encounter for therapeutic drug level monitoring: Secondary | ICD-10-CM | POA: Diagnosis not present

## 2017-05-02 MED ORDER — TRAMADOL HCL 50 MG PO TABS: 50.0000 mg | ORAL_TABLET | Freq: Two times a day (BID) | ORAL | 0 refills | Status: DC

## 2017-05-02 NOTE — Telephone Encounter (Signed)
Patient in office for UDS 05/02/17

## 2017-05-03 DIAGNOSIS — M546 Pain in thoracic spine: Secondary | ICD-10-CM | POA: Diagnosis not present

## 2017-05-03 DIAGNOSIS — M9902 Segmental and somatic dysfunction of thoracic region: Secondary | ICD-10-CM | POA: Diagnosis not present

## 2017-05-03 DIAGNOSIS — M9903 Segmental and somatic dysfunction of lumbar region: Secondary | ICD-10-CM | POA: Diagnosis not present

## 2017-05-03 DIAGNOSIS — M9901 Segmental and somatic dysfunction of cervical region: Secondary | ICD-10-CM | POA: Diagnosis not present

## 2017-05-03 DIAGNOSIS — M542 Cervicalgia: Secondary | ICD-10-CM | POA: Diagnosis not present

## 2017-05-03 DIAGNOSIS — M545 Low back pain: Secondary | ICD-10-CM | POA: Diagnosis not present

## 2017-05-03 LAB — PAIN MGMT, PROFILE 5 W/CONF, U
AMPHETAMINES: NEGATIVE ng/mL (ref <500)
BARBITURATES: NEGATIVE ng/mL (ref <300)
Benzodiazepines: NEGATIVE ng/mL (ref <100)
COCAINE METABOLITE: NEGATIVE ng/mL (ref <150)
CREATININE: 36 mg/dL (ref >=20.0)
METHADONE METABOLITE: NEGATIVE ng/mL (ref <100)
Marijuana Metabolite: NEGATIVE ng/mL (ref <20)
OPIATES: NEGATIVE ng/mL (ref <100)
Oxidant: NEGATIVE ug/mL (ref <200)
Oxycodone: NEGATIVE ng/mL (ref <100)
PH: 5.77 (ref 4.5–9.0)

## 2017-05-08 LAB — PAIN MGMT, TRAMADOL W/MEDMATCH, U
DESMETHYLTRAMADOL: NEGATIVE ng/mL (ref <100)
Tramadol: NEGATIVE ng/mL (ref <100)

## 2017-05-08 NOTE — Telephone Encounter (Signed)
Her urine drug screen is negative for Tramadol. Please ask patient if she was taking the medication or not ? Did she ran out of the medication.

## 2017-05-15 DIAGNOSIS — W109XXA Fall (on) (from) unspecified stairs and steps, initial encounter: Secondary | ICD-10-CM | POA: Diagnosis not present

## 2017-05-15 DIAGNOSIS — R52 Pain, unspecified: Secondary | ICD-10-CM | POA: Diagnosis not present

## 2017-05-15 DIAGNOSIS — M25562 Pain in left knee: Secondary | ICD-10-CM | POA: Diagnosis not present

## 2017-05-15 DIAGNOSIS — S8992XA Unspecified injury of left lower leg, initial encounter: Secondary | ICD-10-CM | POA: Diagnosis not present

## 2017-05-15 DIAGNOSIS — M797 Fibromyalgia: Secondary | ICD-10-CM | POA: Diagnosis not present

## 2017-05-15 DIAGNOSIS — J449 Chronic obstructive pulmonary disease, unspecified: Secondary | ICD-10-CM | POA: Diagnosis not present

## 2017-05-15 DIAGNOSIS — Z79899 Other long term (current) drug therapy: Secondary | ICD-10-CM | POA: Diagnosis not present

## 2017-05-15 DIAGNOSIS — I110 Hypertensive heart disease with heart failure: Secondary | ICD-10-CM | POA: Diagnosis not present

## 2017-05-15 DIAGNOSIS — Z7951 Long term (current) use of inhaled steroids: Secondary | ICD-10-CM | POA: Diagnosis not present

## 2017-05-15 DIAGNOSIS — M7989 Other specified soft tissue disorders: Secondary | ICD-10-CM | POA: Diagnosis not present

## 2017-05-15 DIAGNOSIS — I509 Heart failure, unspecified: Secondary | ICD-10-CM | POA: Diagnosis not present

## 2017-05-15 DIAGNOSIS — S8392XA Sprain of unspecified site of left knee, initial encounter: Secondary | ICD-10-CM | POA: Diagnosis not present

## 2017-05-15 DIAGNOSIS — M1712 Unilateral primary osteoarthritis, left knee: Secondary | ICD-10-CM | POA: Diagnosis not present

## 2017-05-17 DIAGNOSIS — Z7901 Long term (current) use of anticoagulants: Secondary | ICD-10-CM | POA: Diagnosis not present

## 2017-05-21 DIAGNOSIS — Z6834 Body mass index (BMI) 34.0-34.9, adult: Secondary | ICD-10-CM | POA: Diagnosis not present

## 2017-05-21 DIAGNOSIS — M12562 Traumatic arthropathy, left knee: Secondary | ICD-10-CM | POA: Diagnosis not present

## 2017-05-28 DIAGNOSIS — Z6835 Body mass index (BMI) 35.0-35.9, adult: Secondary | ICD-10-CM | POA: Diagnosis not present

## 2017-05-28 DIAGNOSIS — I8002 Phlebitis and thrombophlebitis of superficial vessels of left lower extremity: Secondary | ICD-10-CM | POA: Diagnosis not present

## 2017-05-28 DIAGNOSIS — J4520 Mild intermittent asthma, uncomplicated: Secondary | ICD-10-CM | POA: Diagnosis not present

## 2017-05-28 DIAGNOSIS — M12562 Traumatic arthropathy, left knee: Secondary | ICD-10-CM | POA: Diagnosis not present

## 2017-05-28 DIAGNOSIS — I10 Essential (primary) hypertension: Secondary | ICD-10-CM | POA: Diagnosis not present

## 2017-05-31 DIAGNOSIS — M9902 Segmental and somatic dysfunction of thoracic region: Secondary | ICD-10-CM | POA: Diagnosis not present

## 2017-05-31 DIAGNOSIS — M9903 Segmental and somatic dysfunction of lumbar region: Secondary | ICD-10-CM | POA: Diagnosis not present

## 2017-05-31 DIAGNOSIS — M545 Low back pain: Secondary | ICD-10-CM | POA: Diagnosis not present

## 2017-05-31 DIAGNOSIS — M542 Cervicalgia: Secondary | ICD-10-CM | POA: Diagnosis not present

## 2017-05-31 DIAGNOSIS — M546 Pain in thoracic spine: Secondary | ICD-10-CM | POA: Diagnosis not present

## 2017-05-31 DIAGNOSIS — M9901 Segmental and somatic dysfunction of cervical region: Secondary | ICD-10-CM | POA: Diagnosis not present

## 2017-06-05 DIAGNOSIS — J302 Other seasonal allergic rhinitis: Secondary | ICD-10-CM | POA: Diagnosis not present

## 2017-06-05 DIAGNOSIS — H61893 Other specified disorders of external ear, bilateral: Secondary | ICD-10-CM | POA: Diagnosis not present

## 2017-06-05 DIAGNOSIS — R0981 Nasal congestion: Secondary | ICD-10-CM | POA: Diagnosis not present

## 2017-06-05 DIAGNOSIS — H9313 Tinnitus, bilateral: Secondary | ICD-10-CM | POA: Diagnosis not present

## 2017-06-05 DIAGNOSIS — M26609 Unspecified temporomandibular joint disorder, unspecified side: Secondary | ICD-10-CM | POA: Diagnosis not present

## 2017-06-06 DIAGNOSIS — Z7901 Long term (current) use of anticoagulants: Secondary | ICD-10-CM | POA: Diagnosis not present

## 2017-06-21 DIAGNOSIS — M26609 Unspecified temporomandibular joint disorder, unspecified side: Secondary | ICD-10-CM | POA: Diagnosis not present

## 2017-06-21 DIAGNOSIS — R0981 Nasal congestion: Secondary | ICD-10-CM | POA: Diagnosis not present

## 2017-06-21 DIAGNOSIS — J302 Other seasonal allergic rhinitis: Secondary | ICD-10-CM | POA: Diagnosis not present

## 2017-06-21 DIAGNOSIS — H9319 Tinnitus, unspecified ear: Secondary | ICD-10-CM | POA: Diagnosis not present

## 2017-06-21 DIAGNOSIS — H9313 Tinnitus, bilateral: Secondary | ICD-10-CM | POA: Diagnosis not present

## 2017-06-21 DIAGNOSIS — H61893 Other specified disorders of external ear, bilateral: Secondary | ICD-10-CM | POA: Diagnosis not present

## 2017-06-21 DIAGNOSIS — R0982 Postnasal drip: Secondary | ICD-10-CM | POA: Diagnosis not present

## 2017-06-21 DIAGNOSIS — H903 Sensorineural hearing loss, bilateral: Secondary | ICD-10-CM | POA: Diagnosis not present

## 2017-06-27 DIAGNOSIS — Z7901 Long term (current) use of anticoagulants: Secondary | ICD-10-CM | POA: Diagnosis not present

## 2017-06-28 DIAGNOSIS — M9901 Segmental and somatic dysfunction of cervical region: Secondary | ICD-10-CM | POA: Diagnosis not present

## 2017-06-28 DIAGNOSIS — M542 Cervicalgia: Secondary | ICD-10-CM | POA: Diagnosis not present

## 2017-06-28 DIAGNOSIS — M9903 Segmental and somatic dysfunction of lumbar region: Secondary | ICD-10-CM | POA: Diagnosis not present

## 2017-06-28 DIAGNOSIS — M546 Pain in thoracic spine: Secondary | ICD-10-CM | POA: Diagnosis not present

## 2017-06-28 DIAGNOSIS — M9902 Segmental and somatic dysfunction of thoracic region: Secondary | ICD-10-CM | POA: Diagnosis not present

## 2017-06-28 DIAGNOSIS — M545 Low back pain: Secondary | ICD-10-CM | POA: Diagnosis not present

## 2017-07-02 ENCOUNTER — Other Ambulatory Visit: Payer: Self-pay | Admitting: Cardiovascular Disease

## 2017-07-03 ENCOUNTER — Other Ambulatory Visit: Payer: Self-pay | Admitting: Rheumatology

## 2017-07-03 DIAGNOSIS — M25562 Pain in left knee: Secondary | ICD-10-CM | POA: Diagnosis not present

## 2017-07-03 DIAGNOSIS — M1120 Other chondrocalcinosis, unspecified site: Secondary | ICD-10-CM | POA: Diagnosis not present

## 2017-07-03 DIAGNOSIS — M170 Bilateral primary osteoarthritis of knee: Secondary | ICD-10-CM | POA: Diagnosis not present

## 2017-07-03 DIAGNOSIS — M25561 Pain in right knee: Secondary | ICD-10-CM | POA: Diagnosis not present

## 2017-07-03 NOTE — Telephone Encounter (Signed)
Last Visit: 01/22/17 Next Visit: 07/31/17 Labs: 12/05/16 WNL  Left message to advise patient she is due for labs.  Okay to refill 30 day supply per Dr. Estanislado Pandy

## 2017-07-08 DIAGNOSIS — M25562 Pain in left knee: Secondary | ICD-10-CM | POA: Diagnosis not present

## 2017-07-08 DIAGNOSIS — M25561 Pain in right knee: Secondary | ICD-10-CM | POA: Diagnosis not present

## 2017-07-09 ENCOUNTER — Telehealth: Payer: Self-pay

## 2017-07-09 NOTE — Telephone Encounter (Signed)
ok 

## 2017-07-09 NOTE — Telephone Encounter (Signed)
Last visit: 01/22/2017 Next visit: 07/31/2017 UDS: 05/02/2017 Narc agreement: 05/02/2017   Okay to refill Tramadol?

## 2017-07-10 MED ORDER — TRAMADOL HCL 50 MG PO TABS: 50.0000 mg | ORAL_TABLET | Freq: Two times a day (BID) | ORAL | 0 refills | Status: DC

## 2017-07-15 DIAGNOSIS — M25561 Pain in right knee: Secondary | ICD-10-CM | POA: Diagnosis not present

## 2017-07-15 DIAGNOSIS — M25562 Pain in left knee: Secondary | ICD-10-CM | POA: Diagnosis not present

## 2017-07-16 DIAGNOSIS — M545 Low back pain: Secondary | ICD-10-CM | POA: Diagnosis not present

## 2017-07-16 DIAGNOSIS — M9903 Segmental and somatic dysfunction of lumbar region: Secondary | ICD-10-CM | POA: Diagnosis not present

## 2017-07-16 DIAGNOSIS — M9902 Segmental and somatic dysfunction of thoracic region: Secondary | ICD-10-CM | POA: Diagnosis not present

## 2017-07-16 DIAGNOSIS — M9901 Segmental and somatic dysfunction of cervical region: Secondary | ICD-10-CM | POA: Diagnosis not present

## 2017-07-16 DIAGNOSIS — M546 Pain in thoracic spine: Secondary | ICD-10-CM | POA: Diagnosis not present

## 2017-07-16 DIAGNOSIS — M542 Cervicalgia: Secondary | ICD-10-CM | POA: Diagnosis not present

## 2017-07-19 DIAGNOSIS — Z7901 Long term (current) use of anticoagulants: Secondary | ICD-10-CM | POA: Diagnosis not present

## 2017-07-19 DIAGNOSIS — M25562 Pain in left knee: Secondary | ICD-10-CM | POA: Diagnosis not present

## 2017-07-19 DIAGNOSIS — M25561 Pain in right knee: Secondary | ICD-10-CM | POA: Diagnosis not present

## 2017-07-21 NOTE — Progress Notes (Signed)
Office Visit Note  Patient: Dorothy Ford             Date of Birth: 06/12/1951           MRN: [id number]             PCP: Neale Burly, MD Referring: Neale Burly, MD Visit Date: 07/31/2017 Occupation: @GUAROCC @    Subjective:  Other (low back pain, left knee sprain 05/2017 (post BIL knee injections) )   History of Present Illness: Dorothy Ford is a 66 y.o. female with history of pseudogout, osteoarthritis, disc disease and fibromyalgia. She states she sprained her left knee in September 2018. She was seen at the emergency room where they did x-ray of her knee joint. She's been also having some lower back pain. She is in physical therapy now due to poor balance and has been using a walker. She was seen by a sports medicine doctor who gave her cortisone injection to bilateral knee joints in October. Her pain level from fibromyalgia is controlled on tramadol. She describes her pain level without tramadol about 8 and a tramadol about 4. She denies any flare of pseudogout. She's been taking Colcrys 1 tablet by mouth daily.  Activities of Daily Living:  Patient reports morning stiffness for 5 minutes.   Patient Denies nocturnal pain.  Difficulty dressing/grooming: Denies Difficulty climbing stairs: Reports Difficulty getting out of chair: Reports Difficulty using hands for taps, buttons, cutlery, and/or writing: Denies   Review of Systems  Constitutional: Positive for fatigue. Negative for night sweats, weight gain, weight loss and weakness.  HENT: Negative for mouth sores, trouble swallowing, trouble swallowing, mouth dryness and nose dryness.   Eyes: Negative for pain, redness, visual disturbance and dryness.  Respiratory: Positive for shortness of breath. Negative for cough and difficulty breathing.        H/o COPD  Cardiovascular: Negative for chest pain, palpitations, hypertension, irregular heartbeat and swelling in legs/feet.  Gastrointestinal: Negative for blood  in stool, constipation and diarrhea.  Endocrine: Negative for increased urination.  Genitourinary: Negative for vaginal dryness.  Musculoskeletal: Positive for arthralgias, joint pain, myalgias, morning stiffness and myalgias. Negative for joint swelling, muscle weakness and muscle tenderness.  Skin: Negative for color change, rash, hair loss, skin tightness, ulcers and sensitivity to sunlight.  Allergic/Immunologic: Negative for susceptible to infections.  Neurological: Negative for dizziness, memory loss and night sweats.  Hematological: Negative for swollen glands.  Psychiatric/Behavioral: Positive for depressed mood and sleep disturbance. The patient is nervous/anxious.     PMFS History:  Patient Active Problem List   Diagnosis Date Noted  . 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 Chondromalacia patellae, right knee 01/15/2017  . Osteoarthritis of both hands 07/24/2016  . DDD (degenerative disc disease), cervical 07/24/2016  . DDD (degenerative disc disease), lumbar 07/24/2016  . Osteoporosis 07/24/2016  . Glaucoma 07/24/2016  . Sleep apnea 07/24/2016  . Pseudogout 07/19/2016  . Primary osteoarthritis of both knees 07/19/2016  . Fibromyalgia 07/19/2016  . 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 Pulmonary embolism ([place]) 01/18/2012  . Anxiety 01/18/2012  . Syncope   . RHEUMATOID LUNG 12/30/2009  . OBESITY, UNSPECIFIED 06/02/2009  . DEPRESSIVE DISORDER NOT ELSEWHERE CLASSIFIED 06/02/2009  . SUPRAVENTRICULAR TACHYCARDIA 06/02/2009  . Chronic diastolic heart failure ([place]) 06/02/2009  . COPD 06/02/2009  . DIAPHRAGMAT HERN W/O MENTION OBSTRUCTION/GANGREN 06/02/2009  . PALPITATIONS 06/02/2009  . CHEST PAIN UNSPECIFIED 06/02/2009    Past Medical History:  Diagnosis Date  . Acid reflux   .  Asthma   . Cardiac dysrhythmia, unspecified   . Carotid bruit    Bilateral  . Cataract   . Chest pain, unspecified   . Chronic airway obstruction, not elsewhere classified   . Chronic  sinusitis   . Congestive heart failure, unspecified   . DDD (degenerative disc disease), cervical 07/24/2016  . DDD (degenerative disc disease), lumbar 07/24/2016  . Depressive disorder, not elsewhere classified   . 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 Edema    left leg  . Fibromyalgia   . High cholesterol   . History of rheumatoid arthritis   . Obesity, unspecified   . Obstructive lung disease (generalized) ([place])   . Osteoarthritis of both hands 07/24/2016  . Osteoporosis 07/24/2016  . Palpitations   . Pseudogout involving multiple joints   . SVT (supraventricular tachycardia) ([place])   . Syncope    admitted 09/2011  . Thyroid disease   . Unspecified essential hypertension     Family History  Problem Relation Age of Onset  . Heart disease Mother   . Stroke Unknown   . Heart disease Father    Past Surgical History:  Procedure Laterality Date  . CARPAL TUNNEL RELEASE    . CATHETER ABLATION  02/1997   @ [place]     Social History   Social History Narrative  . Not on file     Objective: Vital Signs: BP (!) 141/76 (BP Location: Left Arm, Patient Position: Sitting, Cuff Size: Normal)   Pulse (!) 58   Resp 19   Ht 5\' 1"  (1.549 m)   Wt 181 lb (82.1 kg)   BMI 34.20 kg/m    Physical Exam  Constitutional: She is oriented to person, place, and time. She appears well-developed and well-nourished.  HENT:  Head: Normocephalic and atraumatic.  Eyes: Conjunctivae and EOM are normal.  Neck: Normal range of motion.  Cardiovascular: Normal rate, regular rhythm, normal heart sounds and intact distal pulses.  Pulmonary/Chest: Effort normal and breath sounds normal.  Abdominal: Soft. Bowel sounds are normal.  Lymphadenopathy:    She has no cervical adenopathy.  Neurological: She is alert and oriented to person, place, and time.  Skin: Skin is warm and dry. Capillary refill takes less than 2 seconds.  Psychiatric: She has a  normal mood and affect. Her behavior is normal.  Nursing note and vitals reviewed.    Musculoskeletal Exam: C-spine and thoracic lumbar spine limited range of motion his discomfort. Shoulder joints elbow joints wrist joints are good range of motion. She has some DIP thickening in her hands. No synovitis was noted. She is crepitus in her bilateral knee joints without any warmth swelling or effusion. She has generalized hyperalgesia due to fibromyalgia.  CDAI Exam: No CDAI exam completed.    Investigation: No additional findings.  DEXA scan from [facility] on 02/18/2017 T score -1.0 and now right femoral neck BMD 0.900, left femoral neck T score -1.0 BMD 0.898.  Imaging: No results found.  Speciality Comments: No specialty comments available.    Procedures:  No procedures performed Allergies: Adhesive [tape] and Codeine   Assessment / Plan:     Visit Diagnoses: Pseudogout - she has not had any flare of pseudogout. She's been taking colcrys 1 tablet a day which is controlling her symptoms quite well.  Primary osteoarthritis of both hands: She has some DIP changes but not much discomfort.  Primary osteoarthritis of both knees -  With chondromalacia patella: She's been having increased knee joint pain since her fall. She is going to a sports medicine doctor and also getting physical therapy which is been helpful. She's been having balance issues and has been using a walker.  DDD (degenerative disc disease), cervical: Chronic pain  DDD (degenerative disc disease), lumbar: Chronic pain  Fibromyalgia - She continues to have some generalized pain and discomfort for which she's been treated by her PCP she is on Cymbalta, tizanidine. She gets tramadol from [place] which is been effective in controlling her pain symptoms. Her UDS and narcotic agreement are up-to-date.  Medication monitoring encounter - Plan: CBC with Differential/Platelet, COMPLETE METABOLIC PANEL WITH GFR as  she's been taking long-term colchicine.  Osteopenia of multiple sites - DEXA 02/18/2014 T score -1.0. Use of calcium and vitamin D was discussed.  Other medical problems are listed as follows:  History of CHF (congestive heart failure)  History of COPD  History of glaucoma  History of pulmonary embolism  History of sleep apnea    Orders: Orders Placed This Encounter  Procedures  . CBC with Differential/Platelet  . COMPLETE METABOLIC PANEL WITH GFR   No orders of the defined types were placed in this encounter.   Face-to-face time spent with patient was 30 minutes. Greater than 50% of time was spent in counseling and coordination of care.  Follow-Up Instructions: Return in about 6 months (around 01/28/2018) for Osteoarthritis DDD FMS.   Bo Merino, MD  Note - This record has been created using Editor, commissioning.  Chart creation errors have been sought, but may not always  have been located. Such creation errors do not reflect on  the standard of medical care.

## 2017-07-22 ENCOUNTER — Ambulatory Visit: Payer: Medicare Other | Admitting: Rheumatology

## 2017-07-29 DIAGNOSIS — M25561 Pain in right knee: Secondary | ICD-10-CM | POA: Diagnosis not present

## 2017-07-29 DIAGNOSIS — M25562 Pain in left knee: Secondary | ICD-10-CM | POA: Diagnosis not present

## 2017-07-30 DIAGNOSIS — J302 Other seasonal allergic rhinitis: Secondary | ICD-10-CM | POA: Diagnosis not present

## 2017-07-30 DIAGNOSIS — R0981 Nasal congestion: Secondary | ICD-10-CM | POA: Diagnosis not present

## 2017-07-30 DIAGNOSIS — Z87891 Personal history of nicotine dependence: Secondary | ICD-10-CM | POA: Diagnosis not present

## 2017-07-30 DIAGNOSIS — J45909 Unspecified asthma, uncomplicated: Secondary | ICD-10-CM | POA: Diagnosis not present

## 2017-07-31 ENCOUNTER — Encounter: Payer: Self-pay | Admitting: Rheumatology

## 2017-07-31 ENCOUNTER — Ambulatory Visit (INDEPENDENT_AMBULATORY_CARE_PROVIDER_SITE_OTHER): Payer: Medicare HMO | Admitting: Rheumatology

## 2017-07-31 VITALS — BP 141/76 | HR 58 | Resp 19 | Ht 61.0 in | Wt 181.0 lb

## 2017-07-31 DIAGNOSIS — M5136 Other intervertebral disc degeneration, lumbar region: Secondary | ICD-10-CM | POA: Diagnosis not present

## 2017-07-31 DIAGNOSIS — M797 Fibromyalgia: Secondary | ICD-10-CM

## 2017-07-31 DIAGNOSIS — Z8669 Personal history of other diseases of the nervous system and sense organs: Secondary | ICD-10-CM

## 2017-07-31 DIAGNOSIS — M5030 Other cervical disc degeneration, unspecified cervical region: Secondary | ICD-10-CM | POA: Diagnosis not present

## 2017-07-31 DIAGNOSIS — M1120 Other chondrocalcinosis, unspecified site: Secondary | ICD-10-CM | POA: Diagnosis not present

## 2017-07-31 DIAGNOSIS — Z8679 Personal history of other diseases of the circulatory system: Secondary | ICD-10-CM

## 2017-07-31 DIAGNOSIS — Z8709 Personal history of other diseases of the respiratory system: Secondary | ICD-10-CM

## 2017-07-31 DIAGNOSIS — M19041 Primary osteoarthritis, right hand: Secondary | ICD-10-CM

## 2017-07-31 DIAGNOSIS — M8589 Other specified disorders of bone density and structure, multiple sites: Secondary | ICD-10-CM | POA: Diagnosis not present

## 2017-07-31 DIAGNOSIS — Z5181 Encounter for therapeutic drug level monitoring: Secondary | ICD-10-CM

## 2017-07-31 DIAGNOSIS — M170 Bilateral primary osteoarthritis of knee: Secondary | ICD-10-CM

## 2017-07-31 DIAGNOSIS — M19042 Primary osteoarthritis, left hand: Secondary | ICD-10-CM

## 2017-07-31 DIAGNOSIS — Z86711 Personal history of pulmonary embolism: Secondary | ICD-10-CM

## 2017-07-31 NOTE — Progress Notes (Signed)
Tramadol 50mg - 2 tablets in the AM, 2 tablets in PM UDS and Narcotic agreement completed 05/02/17  Bertis Ruddy, PharmD

## 2017-08-01 LAB — CBC WITH DIFFERENTIAL/PLATELET
Basophils Absolute: 40 {cells}/uL (ref 0–200)
Basophils Relative: 0.6 %
Eosinophils Absolute: 60 {cells}/uL (ref 15–500)
Eosinophils Relative: 0.9 %
HCT: 38.4 % (ref 35.0–45.0)
Hemoglobin: 12.8 g/dL (ref 11.7–15.5)
Lymphs Abs: 2439 {cells}/uL (ref 850–3900)
MCH: 29 pg (ref 27.0–33.0)
MCHC: 33.3 g/dL (ref 32.0–36.0)
MCV: 87.1 fL (ref 80.0–100.0)
MPV: 10.2 fL (ref 7.5–12.5)
Monocytes Relative: 6.2 %
Neutro Abs: 3745 {cells}/uL (ref 1500–7800)
Neutrophils Relative %: 55.9 %
Platelets: 295 Thousand/uL (ref 140–400)
RBC: 4.41 Million/uL (ref 3.80–5.10)
RDW: 13.3 % (ref 11.0–15.0)
Total Lymphocyte: 36.4 %
WBC mixed population: 415 {cells}/uL (ref 200–950)
WBC: 6.7 Thousand/uL (ref 3.8–10.8)

## 2017-08-01 LAB — COMPLETE METABOLIC PANEL WITHOUT GFR
AG Ratio: 1.4 (calc) (ref 1.0–2.5)
ALT: 21 U/L (ref 6–29)
AST: 16 U/L (ref 10–35)
Albumin: 4.1 g/dL (ref 3.6–5.1)
Alkaline phosphatase (APISO): 148 U/L — ABNORMAL HIGH (ref 33–130)
BUN: 19 mg/dL (ref 7–25)
CO2: 31 mmol/L (ref 20–32)
Calcium: 9.2 mg/dL (ref 8.6–10.4)
Chloride: 103 mmol/L (ref 98–110)
Creat: 0.95 mg/dL (ref 0.50–0.99)
GFR, Est African American: 73 mL/min/1.73m2
GFR, Est Non African American: 63 mL/min/1.73m2
Globulin: 2.9 g/dL (ref 1.9–3.7)
Glucose, Bld: 96 mg/dL (ref 65–99)
Potassium: 3.9 mmol/L (ref 3.5–5.3)
Sodium: 141 mmol/L (ref 135–146)
Total Bilirubin: 0.5 mg/dL (ref 0.2–1.2)
Total Protein: 7 g/dL (ref 6.1–8.1)

## 2017-08-01 NOTE — Progress Notes (Signed)
Alkaline phosphatase is mildly elevated we will continue to monitor. Rest  of the labs are normal.

## 2017-08-02 DIAGNOSIS — M25562 Pain in left knee: Secondary | ICD-10-CM | POA: Diagnosis not present

## 2017-08-02 DIAGNOSIS — M25561 Pain in right knee: Secondary | ICD-10-CM | POA: Diagnosis not present

## 2017-08-05 ENCOUNTER — Other Ambulatory Visit: Payer: Self-pay | Admitting: Rheumatology

## 2017-08-05 NOTE — Telephone Encounter (Signed)
Last visit: 07/31/17 Next Visit: 01/29/18 Labs: 08/01/17 Alkaline phosphatase is mildly elevated we will continue to monitor. Rest of the labs are normal.  Okay to refill per Dr. Estanislado Pandy

## 2017-08-07 DIAGNOSIS — Z7901 Long term (current) use of anticoagulants: Secondary | ICD-10-CM | POA: Diagnosis not present

## 2017-08-09 DIAGNOSIS — M25562 Pain in left knee: Secondary | ICD-10-CM | POA: Diagnosis not present

## 2017-08-09 DIAGNOSIS — M170 Bilateral primary osteoarthritis of knee: Secondary | ICD-10-CM | POA: Diagnosis not present

## 2017-08-09 DIAGNOSIS — M25561 Pain in right knee: Secondary | ICD-10-CM | POA: Diagnosis not present

## 2017-08-16 DIAGNOSIS — M25561 Pain in right knee: Secondary | ICD-10-CM | POA: Diagnosis not present

## 2017-08-16 DIAGNOSIS — M170 Bilateral primary osteoarthritis of knee: Secondary | ICD-10-CM | POA: Diagnosis not present

## 2017-08-16 DIAGNOSIS — M25562 Pain in left knee: Secondary | ICD-10-CM | POA: Diagnosis not present

## 2017-08-21 DIAGNOSIS — M170 Bilateral primary osteoarthritis of knee: Secondary | ICD-10-CM | POA: Diagnosis not present

## 2017-08-21 DIAGNOSIS — M25562 Pain in left knee: Secondary | ICD-10-CM | POA: Diagnosis not present

## 2017-08-21 DIAGNOSIS — M25561 Pain in right knee: Secondary | ICD-10-CM | POA: Diagnosis not present

## 2017-08-22 DIAGNOSIS — J4520 Mild intermittent asthma, uncomplicated: Secondary | ICD-10-CM | POA: Diagnosis not present

## 2017-08-22 DIAGNOSIS — Z6834 Body mass index (BMI) 34.0-34.9, adult: Secondary | ICD-10-CM | POA: Diagnosis not present

## 2017-08-22 DIAGNOSIS — I10 Essential (primary) hypertension: Secondary | ICD-10-CM | POA: Diagnosis not present

## 2017-08-23 DIAGNOSIS — Z7901 Long term (current) use of anticoagulants: Secondary | ICD-10-CM | POA: Diagnosis not present

## 2017-08-30 DIAGNOSIS — M170 Bilateral primary osteoarthritis of knee: Secondary | ICD-10-CM | POA: Diagnosis not present

## 2017-08-30 DIAGNOSIS — M25561 Pain in right knee: Secondary | ICD-10-CM | POA: Diagnosis not present

## 2017-08-30 DIAGNOSIS — M25562 Pain in left knee: Secondary | ICD-10-CM | POA: Diagnosis not present

## 2017-09-02 DIAGNOSIS — Z1231 Encounter for screening mammogram for malignant neoplasm of breast: Secondary | ICD-10-CM | POA: Diagnosis not present

## 2017-09-04 DIAGNOSIS — Z7901 Long term (current) use of anticoagulants: Secondary | ICD-10-CM | POA: Diagnosis not present

## 2017-09-04 DIAGNOSIS — S8992XA Unspecified injury of left lower leg, initial encounter: Secondary | ICD-10-CM | POA: Diagnosis not present

## 2017-09-04 DIAGNOSIS — S0993XA Unspecified injury of face, initial encounter: Secondary | ICD-10-CM | POA: Diagnosis not present

## 2017-09-04 DIAGNOSIS — Z79899 Other long term (current) drug therapy: Secondary | ICD-10-CM | POA: Diagnosis not present

## 2017-09-04 DIAGNOSIS — Z86711 Personal history of pulmonary embolism: Secondary | ICD-10-CM | POA: Diagnosis not present

## 2017-09-04 DIAGNOSIS — S0081XA Abrasion of other part of head, initial encounter: Secondary | ICD-10-CM | POA: Diagnosis not present

## 2017-09-04 DIAGNOSIS — M25562 Pain in left knee: Secondary | ICD-10-CM | POA: Diagnosis not present

## 2017-09-04 DIAGNOSIS — S199XXA Unspecified injury of neck, initial encounter: Secondary | ICD-10-CM | POA: Diagnosis not present

## 2017-09-04 DIAGNOSIS — M5030 Other cervical disc degeneration, unspecified cervical region: Secondary | ICD-10-CM | POA: Diagnosis not present

## 2017-09-04 DIAGNOSIS — S0990XA Unspecified injury of head, initial encounter: Secondary | ICD-10-CM | POA: Diagnosis not present

## 2017-09-04 DIAGNOSIS — W19XXXA Unspecified fall, initial encounter: Secondary | ICD-10-CM | POA: Diagnosis not present

## 2017-09-04 DIAGNOSIS — Z7951 Long term (current) use of inhaled steroids: Secondary | ICD-10-CM | POA: Diagnosis not present

## 2017-09-04 DIAGNOSIS — R52 Pain, unspecified: Secondary | ICD-10-CM | POA: Diagnosis not present

## 2017-09-04 DIAGNOSIS — J449 Chronic obstructive pulmonary disease, unspecified: Secondary | ICD-10-CM | POA: Diagnosis not present

## 2017-09-05 DIAGNOSIS — S0003XA Contusion of scalp, initial encounter: Secondary | ICD-10-CM | POA: Diagnosis not present

## 2017-09-05 DIAGNOSIS — C760 Malignant neoplasm of head, face and neck: Secondary | ICD-10-CM | POA: Diagnosis not present

## 2017-09-05 DIAGNOSIS — Z6835 Body mass index (BMI) 35.0-35.9, adult: Secondary | ICD-10-CM | POA: Diagnosis not present

## 2017-09-09 DIAGNOSIS — R0602 Shortness of breath: Secondary | ICD-10-CM | POA: Diagnosis not present

## 2017-09-09 DIAGNOSIS — J449 Chronic obstructive pulmonary disease, unspecified: Secondary | ICD-10-CM | POA: Diagnosis not present

## 2017-09-09 DIAGNOSIS — Z86711 Personal history of pulmonary embolism: Secondary | ICD-10-CM | POA: Diagnosis not present

## 2017-09-09 DIAGNOSIS — R0682 Tachypnea, not elsewhere classified: Secondary | ICD-10-CM | POA: Diagnosis not present

## 2017-09-09 DIAGNOSIS — I509 Heart failure, unspecified: Secondary | ICD-10-CM | POA: Diagnosis not present

## 2017-09-09 DIAGNOSIS — G4730 Sleep apnea, unspecified: Secondary | ICD-10-CM | POA: Diagnosis not present

## 2017-09-09 DIAGNOSIS — I110 Hypertensive heart disease with heart failure: Secondary | ICD-10-CM | POA: Diagnosis not present

## 2017-09-09 DIAGNOSIS — Z7951 Long term (current) use of inhaled steroids: Secondary | ICD-10-CM | POA: Diagnosis not present

## 2017-09-09 DIAGNOSIS — Z79899 Other long term (current) drug therapy: Secondary | ICD-10-CM | POA: Diagnosis not present

## 2017-09-09 DIAGNOSIS — F419 Anxiety disorder, unspecified: Secondary | ICD-10-CM | POA: Diagnosis not present

## 2017-09-11 ENCOUNTER — Other Ambulatory Visit: Payer: Self-pay | Admitting: Rheumatology

## 2017-09-11 ENCOUNTER — Encounter: Payer: Self-pay | Admitting: *Deleted

## 2017-09-11 NOTE — Telephone Encounter (Signed)
Last visit: 07/31/17 Next Visit: 01/29/18 Labs: 08/01/17 Alkaline phosphatase is mildly elevated we will continue to monitor. Rest of the labs are normal.  Okay to refill per Dr. Estanislado Pandy

## 2017-09-12 ENCOUNTER — Encounter: Payer: Self-pay | Admitting: Cardiovascular Disease

## 2017-09-12 ENCOUNTER — Other Ambulatory Visit: Payer: Self-pay | Admitting: *Deleted

## 2017-09-12 ENCOUNTER — Ambulatory Visit (INDEPENDENT_AMBULATORY_CARE_PROVIDER_SITE_OTHER): Payer: Medicare HMO | Admitting: Cardiovascular Disease

## 2017-09-12 VITALS — BP 104/72 | HR 73 | Ht 61.0 in | Wt 172.0 lb

## 2017-09-12 DIAGNOSIS — I471 Supraventricular tachycardia: Secondary | ICD-10-CM

## 2017-09-12 DIAGNOSIS — I5032 Chronic diastolic (congestive) heart failure: Secondary | ICD-10-CM | POA: Diagnosis not present

## 2017-09-12 DIAGNOSIS — R002 Palpitations: Secondary | ICD-10-CM

## 2017-09-12 DIAGNOSIS — I2782 Chronic pulmonary embolism: Secondary | ICD-10-CM

## 2017-09-12 DIAGNOSIS — I10 Essential (primary) hypertension: Secondary | ICD-10-CM

## 2017-09-12 DIAGNOSIS — Z9289 Personal history of other medical treatment: Secondary | ICD-10-CM

## 2017-09-12 NOTE — Patient Instructions (Signed)
Medication Instructions:  Continue all current medications.  Labwork: none  Testing/Procedures: none  Follow-Up: 3 months   Any Other Special Instructions Will Be Listed Below (If Applicable).  If you need a refill on your cardiac medications before your next appointment, please call your pharmacy.  

## 2017-09-12 NOTE — Progress Notes (Signed)
SUBJECTIVE: The patient presents for routine follow-up. She also has a history of PSVT, sinus tachycardia with palpitations, hypertension, chronic diastolic heart failure, COPD, and pulmonary embolism. She also has anxiety with panic attacks.  Nuclear stress test on 10/15/16 was normal, LVEF greater than 65%.  She was recently evaluated for shortness of breath at [facility] on 09/10/17.  I reviewed all relevant documentation, labs, and studies.  Creatinine 0.68, hemoglobin 12.3, normal troponin, white blood cells 5.9, BUN 9.  Blood pressure and oxygen saturations were normal.  Chest x-ray showed no acute cardiopulmonary abnormalities.  ECG which I personally interpreted demonstrated sinus rhythm with mild nonspecific T wave abnormalities.  She does have a history of panic attacks but has not had one in some time.  She was sitting watching TV and then felt her legs began to shake.  She then became short of breath and felt nervous.  She then experienced a sensation as if her heart were racing.  By the time EMS came and got her and she went outside the cold air it brought her heart rate down.  She is also been struggling with seasonal allergies and postnasal drip.  She takes Klonopin at night for anxiety.   Review of Systems: As per "subjective", otherwise negative.  Allergies  Allergen Reactions  . Adhesive [Tape] Rash  . Codeine Nausea And Vomiting    REACTION: vomiting    Current Outpatient Medications  Medication Sig Dispense Refill  . albuterol (PROVENTIL HFA;VENTOLIN HFA) 108 (90 Base) MCG/ACT inhaler Inhale into the lungs every 6 (six) hours as needed for wheezing or shortness of breath.    Marland Kitchen amLODipine (NORVASC) 5 MG tablet Take 5 mg by mouth daily.    Marland Kitchen atorvastatin (LIPITOR) 40 MG tablet Take 40 mg by mouth daily.    Marland Kitchen azelastine (ASTELIN) 0.1 % nasal spray Place 1 spray into both nostrils daily.    . cetirizine (ZYRTEC) 10 MG tablet Take 10 mg by mouth daily.     . clonazePAM (KLONOPIN) 0.5 MG tablet Take 0.5 mg by mouth at bedtime.     . colchicine 0.6 MG tablet TAKE (1) TABLET BY MOUTH ONCE DAILY. 30 tablet 3  . COLCRYS 0.6 MG tablet TAKE (1) TABLET BY MOUTH ONCE DAILY. 30 tablet 2  . cycloSPORINE (RESTASIS) 0.05 % ophthalmic emulsion Place 1 drop into both eyes 2 (two) times daily.    . diclofenac (VOLTAREN) 75 MG EC tablet     . DULoxetine (CYMBALTA) 60 MG capsule     . fluticasone (FLONASE) 50 MCG/ACT nasal spray Place 1 spray into the nose daily.     . furosemide (LASIX) 40 MG tablet TAKE 1 TABLET ONCE DAILY. 30 tablet 6  . guaiFENesin (MUCINEX) 600 MG 12 hr tablet Take 600 mg by mouth 2 (two) times daily as needed.    . hydrOXYzine (VISTARIL) 25 MG capsule Take 25 mg by mouth 3 (three) times daily as needed.     Marland Kitchen ipratropium (ATROVENT) 0.06 % nasal spray Place 1 spray into the nose daily.    Marland Kitchen ipratropium-albuterol (DUONEB) 0.5-2.5 (3) MG/3ML SOLN Take 3 mLs by nebulization.    Marland Kitchen levalbuterol (XOPENEX HFA) 45 MCG/ACT inhaler Inhale 2 puffs into the lungs every 6 (six) hours as needed for wheezing.    Marland Kitchen levocetirizine (XYZAL) 5 MG tablet Take 5 mg by mouth daily.    Marland Kitchen levothyroxine (SYNTHROID, LEVOTHROID) 50 MCG tablet Take 50 mcg by mouth daily before breakfast.     .  loperamide (IMODIUM A-D) 2 MG tablet Take 2 mg by mouth 4 (four) times daily as needed for diarrhea or loose stools.    . metoprolol succinate (TOPROL-XL) 25 MG 24 hr tablet Take 1 tablet (25 mg total) by mouth daily.    . montelukast (SINGULAIR) 10 MG tablet Take 1 tablet by mouth daily.    Marland Kitchen nystatin (MYCOSTATIN) powder Apply topically as needed.    . ondansetron (ZOFRAN) 4 MG tablet     . pantoprazole (PROTONIX) 40 MG tablet Take 40 mg by mouth daily.    Marland Kitchen PARoxetine (PAXIL) 30 MG tablet Take 30 mg by mouth daily.    . potassium chloride (K-DUR) 10 MEQ tablet Take 2 tablets by mouth daily.    . predniSONE (DELTASONE) 10 MG tablet Take 10 mg by mouth as needed (for asthma  flare).     . ranitidine (ZANTAC) 300 MG tablet Take 300 mg by mouth at bedtime.    . Sennosides (SENOKOT PO) Take by mouth.    . Simethicone (GAS-X EXTRA STRENGTH) 125 MG CAPS Take 2 capsules by mouth as needed.    . SYMBICORT 160-4.5 MCG/ACT inhaler Inhale 2 puffs into the lungs 2 (two) times daily.    Marland Kitchen tiZANidine (ZANAFLEX) 4 MG tablet Take 4 mg by mouth as needed.    . traMADol (ULTRAM) 50 MG tablet Take 1-2 tablets (50-100 mg total) 2 (two) times daily by mouth. 120 tablet 0  . VOLTAREN 1 % GEL Apply 1 g topically 2 (two) times daily.    Marland Kitchen warfarin (COUMADIN) 5 MG tablet Take 5 mg by mouth daily. Managed by [place] office  2.5mg  Monday thru Friday. 5 mg on Saturday and Sunday.     No current facility-administered medications for this visit.     Past Medical History:  Diagnosis Date  . Acid reflux   . Asthma   . Cardiac dysrhythmia, unspecified   . Carotid bruit    Bilateral  . Cataract   . Chest pain, unspecified   . Chronic airway obstruction, not elsewhere classified   . Chronic sinusitis   . Congestive heart failure, unspecified   . DDD (degenerative disc disease), cervical 07/24/2016  . DDD (degenerative disc disease), lumbar 07/24/2016  . Depressive disorder, not elsewhere classified   . 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 Edema    left leg  . Fibromyalgia   . High cholesterol   . History of rheumatoid arthritis   . Obesity, unspecified   . Obstructive lung disease (generalized) ([place])   . Osteoarthritis of both hands 07/24/2016  . Osteoporosis 07/24/2016  . Palpitations   . Pseudogout involving multiple joints   . SVT (supraventricular tachycardia) ([place])   . Syncope    admitted 09/2011  . Thyroid disease   . Unspecified essential hypertension     Past Surgical History:  Procedure Laterality Date  . CARPAL TUNNEL RELEASE    . CATHETER ABLATION  02/1997   @ [place]      Social History    Socioeconomic History  . Marital status: Single    Spouse name: Not on file  . Number of children: Not on file  . Years of education: Not on file  . Highest education level: Not on file  Social Needs  . Financial resource strain: Not on file  . Food insecurity - worry: Not on file  . Food insecurity - inability: Not on file  . Transportation  needs - medical: Not on file  . Transportation needs - non-medical: Not on file  Occupational History  . Not on file  Tobacco Use  . Smoking status: Never Smoker  . Smokeless tobacco: Never Used  Substance and Sexual Activity  . Alcohol use: No    Alcohol/week: 0.0 oz  . Drug use: No  . Sexual activity: Not on file  Other Topics Concern  . Not on file  Social History Narrative  . Not on file     Vitals:   09/12/17 1328  BP: 104/72  Pulse: 73  SpO2: 98%  Weight: 172 lb (78 kg)  Height: 5\' 1"  (1.549 m)    Wt Readings from Last 3 Encounters:  09/12/17 172 lb (78 kg)  07/31/17 181 lb (82.1 kg)  01/22/17 180 lb (81.6 kg)     PHYSICAL EXAM General: NAD HEENT: Normal. Neck: No JVD, no thyromegaly. Lungs: Clear to auscultation bilaterally with normal respiratory effort. CV: Regular rate and rhythm, normal S1/S2, no S3/S4, no murmur. No pretibial or periankle edema.  Abdomen: Soft, nontender, no distention.  Neurologic: Alert and oriented.  Psych: Normal affect. Skin: Normal. Musculoskeletal: No gross deformities.    ECG: Most recent ECG reviewed.   Labs: Lab Results  Component Value Date/Time   K 3.9 07/31/2017 10:11 AM   BUN 19 07/31/2017 10:11 AM   CREATININE 0.95 07/31/2017 10:11 AM   ALT 21 07/31/2017 10:11 AM   HGB 12.8 07/31/2017 10:11 AM     Lipids: No results found for: LDLCALC, LDLDIRECT, CHOL, TRIG, HDL     ASSESSMENT AND PLAN:  Palpitations/SinusTachycardia/History of PSVT It is unclear if she had a paroxysm of supraventricular tachycardia leading to anxiety or if she had an anxiety attack  which led to palpitations.  For the time being, I will not change the dose of metoprolol succinate.  She apparently had a previous radiofrequency ablation in 1998 at [facility] which was unsuccessful. If she were to have a recurrence, I would consider cardiac monitoring.  She appeared to have an allergic reaction to the adhesive leads in the past.  I may have her see EP.  Chronic diastolic heart failure Euvolemic. No changes to therapy.  Pulmonary embolism Remains on chronic anticoagulation with warfarin.  Essential HTN Controlled. No changes at this time.  Progressive exertional dyspnea/fatigue/abnormal ECG Normal nuclear stress test as detailed above. No further cardiovascular testing is indicated at this time.     Disposition: Follow up 3 months  Time spent: 40 minutes, of which greater than 50% was spent reviewing symptoms, relevant blood tests and studies, and discussing management plan with the patient.   Kate Sable, M.D., F.A.C.C.

## 2017-09-13 ENCOUNTER — Other Ambulatory Visit: Payer: Self-pay | Admitting: *Deleted

## 2017-09-13 DIAGNOSIS — Z7901 Long term (current) use of anticoagulants: Secondary | ICD-10-CM | POA: Diagnosis not present

## 2017-09-13 MED ORDER — TRAMADOL HCL 50 MG PO TABS: 50.0000 mg | ORAL_TABLET | Freq: Two times a day (BID) | ORAL | 0 refills | Status: DC

## 2017-09-13 NOTE — Telephone Encounter (Signed)
Last Visit: 07/31/17 Next Visit 01/29/18 UDS and Narcotic agreement completed 05/02/17  Okay to refill Tramadol?

## 2017-09-13 NOTE — Telephone Encounter (Signed)
ok 

## 2017-09-30 DIAGNOSIS — M25561 Pain in right knee: Secondary | ICD-10-CM | POA: Diagnosis not present

## 2017-09-30 DIAGNOSIS — M170 Bilateral primary osteoarthritis of knee: Secondary | ICD-10-CM | POA: Diagnosis not present

## 2017-09-30 DIAGNOSIS — M25562 Pain in left knee: Secondary | ICD-10-CM | POA: Diagnosis not present

## 2017-10-02 DIAGNOSIS — I10 Essential (primary) hypertension: Secondary | ICD-10-CM | POA: Diagnosis not present

## 2017-10-02 DIAGNOSIS — M545 Low back pain: Secondary | ICD-10-CM | POA: Diagnosis not present

## 2017-10-02 DIAGNOSIS — M542 Cervicalgia: Secondary | ICD-10-CM | POA: Diagnosis not present

## 2017-10-02 DIAGNOSIS — M9901 Segmental and somatic dysfunction of cervical region: Secondary | ICD-10-CM | POA: Diagnosis not present

## 2017-10-02 DIAGNOSIS — M9902 Segmental and somatic dysfunction of thoracic region: Secondary | ICD-10-CM | POA: Diagnosis not present

## 2017-10-02 DIAGNOSIS — M546 Pain in thoracic spine: Secondary | ICD-10-CM | POA: Diagnosis not present

## 2017-10-02 DIAGNOSIS — M9903 Segmental and somatic dysfunction of lumbar region: Secondary | ICD-10-CM | POA: Diagnosis not present

## 2017-10-04 DIAGNOSIS — M25561 Pain in right knee: Secondary | ICD-10-CM | POA: Diagnosis not present

## 2017-10-04 DIAGNOSIS — Z6835 Body mass index (BMI) 35.0-35.9, adult: Secondary | ICD-10-CM | POA: Diagnosis not present

## 2017-10-04 DIAGNOSIS — M25562 Pain in left knee: Secondary | ICD-10-CM | POA: Diagnosis not present

## 2017-10-04 DIAGNOSIS — C760 Malignant neoplasm of head, face and neck: Secondary | ICD-10-CM | POA: Diagnosis not present

## 2017-10-04 DIAGNOSIS — M170 Bilateral primary osteoarthritis of knee: Secondary | ICD-10-CM | POA: Diagnosis not present

## 2017-10-04 DIAGNOSIS — S0003XA Contusion of scalp, initial encounter: Secondary | ICD-10-CM | POA: Diagnosis not present

## 2017-10-04 DIAGNOSIS — Z7901 Long term (current) use of anticoagulants: Secondary | ICD-10-CM | POA: Diagnosis not present

## 2017-10-07 ENCOUNTER — Telehealth: Payer: Self-pay | Admitting: Rheumatology

## 2017-10-07 NOTE — Telephone Encounter (Signed)
Patient called to request a prescription refill of Tramadol.  Patient uses [facility] in [place]

## 2017-10-07 NOTE — Telephone Encounter (Signed)
Called patient and advised patient the last refill was sent in on 09/13/2017 for 120 tablets which should last her 30 days since she takes 4 tablets per day. Patient states she only received 28 tablets, I advised patient to call the pharmacy.

## 2017-10-16 DIAGNOSIS — M170 Bilateral primary osteoarthritis of knee: Secondary | ICD-10-CM | POA: Diagnosis not present

## 2017-10-16 DIAGNOSIS — M25561 Pain in right knee: Secondary | ICD-10-CM | POA: Diagnosis not present

## 2017-10-16 DIAGNOSIS — M25562 Pain in left knee: Secondary | ICD-10-CM | POA: Diagnosis not present

## 2017-10-23 DIAGNOSIS — M797 Fibromyalgia: Secondary | ICD-10-CM | POA: Diagnosis not present

## 2017-10-23 DIAGNOSIS — M1049 Other secondary gout, multiple sites: Secondary | ICD-10-CM | POA: Diagnosis not present

## 2017-10-23 DIAGNOSIS — S0003XA Contusion of scalp, initial encounter: Secondary | ICD-10-CM | POA: Diagnosis not present

## 2017-10-23 DIAGNOSIS — C760 Malignant neoplasm of head, face and neck: Secondary | ICD-10-CM | POA: Diagnosis not present

## 2017-10-23 DIAGNOSIS — Z6835 Body mass index (BMI) 35.0-35.9, adult: Secondary | ICD-10-CM | POA: Diagnosis not present

## 2017-10-23 DIAGNOSIS — E7849 Other hyperlipidemia: Secondary | ICD-10-CM | POA: Diagnosis not present

## 2017-10-23 DIAGNOSIS — J45998 Other asthma: Secondary | ICD-10-CM | POA: Diagnosis not present

## 2017-10-23 DIAGNOSIS — M25562 Pain in left knee: Secondary | ICD-10-CM | POA: Diagnosis not present

## 2017-10-23 DIAGNOSIS — M109 Gout, unspecified: Secondary | ICD-10-CM | POA: Diagnosis not present

## 2017-10-23 DIAGNOSIS — M170 Bilateral primary osteoarthritis of knee: Secondary | ICD-10-CM | POA: Diagnosis not present

## 2017-10-23 DIAGNOSIS — F320 Major depressive disorder, single episode, mild: Secondary | ICD-10-CM | POA: Diagnosis not present

## 2017-10-23 DIAGNOSIS — M25561 Pain in right knee: Secondary | ICD-10-CM | POA: Diagnosis not present

## 2017-10-23 DIAGNOSIS — Z6834 Body mass index (BMI) 34.0-34.9, adult: Secondary | ICD-10-CM | POA: Diagnosis not present

## 2017-10-23 DIAGNOSIS — J3089 Other allergic rhinitis: Secondary | ICD-10-CM | POA: Diagnosis not present

## 2017-10-25 DIAGNOSIS — M170 Bilateral primary osteoarthritis of knee: Secondary | ICD-10-CM | POA: Diagnosis not present

## 2017-10-25 DIAGNOSIS — M25561 Pain in right knee: Secondary | ICD-10-CM | POA: Diagnosis not present

## 2017-10-25 DIAGNOSIS — M25562 Pain in left knee: Secondary | ICD-10-CM | POA: Diagnosis not present

## 2017-10-31 DIAGNOSIS — J45998 Other asthma: Secondary | ICD-10-CM | POA: Diagnosis not present

## 2017-10-31 DIAGNOSIS — E7849 Other hyperlipidemia: Secondary | ICD-10-CM | POA: Diagnosis not present

## 2017-10-31 DIAGNOSIS — F320 Major depressive disorder, single episode, mild: Secondary | ICD-10-CM | POA: Diagnosis not present

## 2017-10-31 DIAGNOSIS — M797 Fibromyalgia: Secondary | ICD-10-CM | POA: Diagnosis not present

## 2017-11-01 DIAGNOSIS — M25562 Pain in left knee: Secondary | ICD-10-CM | POA: Diagnosis not present

## 2017-11-01 DIAGNOSIS — M25561 Pain in right knee: Secondary | ICD-10-CM | POA: Diagnosis not present

## 2017-11-06 DIAGNOSIS — M25561 Pain in right knee: Secondary | ICD-10-CM | POA: Diagnosis not present

## 2017-11-06 DIAGNOSIS — M25562 Pain in left knee: Secondary | ICD-10-CM | POA: Diagnosis not present

## 2017-11-08 DIAGNOSIS — M25562 Pain in left knee: Secondary | ICD-10-CM | POA: Diagnosis not present

## 2017-11-08 DIAGNOSIS — M25561 Pain in right knee: Secondary | ICD-10-CM | POA: Diagnosis not present

## 2017-11-11 DIAGNOSIS — M25562 Pain in left knee: Secondary | ICD-10-CM | POA: Diagnosis not present

## 2017-11-11 DIAGNOSIS — M25561 Pain in right knee: Secondary | ICD-10-CM | POA: Diagnosis not present

## 2017-11-15 DIAGNOSIS — Z7901 Long term (current) use of anticoagulants: Secondary | ICD-10-CM | POA: Diagnosis not present

## 2017-11-15 DIAGNOSIS — M25562 Pain in left knee: Secondary | ICD-10-CM | POA: Diagnosis not present

## 2017-11-15 DIAGNOSIS — M25561 Pain in right knee: Secondary | ICD-10-CM | POA: Diagnosis not present

## 2017-11-19 DIAGNOSIS — M25562 Pain in left knee: Secondary | ICD-10-CM | POA: Diagnosis not present

## 2017-11-19 DIAGNOSIS — M25561 Pain in right knee: Secondary | ICD-10-CM | POA: Diagnosis not present

## 2017-11-20 DIAGNOSIS — M1120 Other chondrocalcinosis, unspecified site: Secondary | ICD-10-CM | POA: Diagnosis not present

## 2017-11-20 DIAGNOSIS — M170 Bilateral primary osteoarthritis of knee: Secondary | ICD-10-CM | POA: Diagnosis not present

## 2017-11-20 DIAGNOSIS — M19041 Primary osteoarthritis, right hand: Secondary | ICD-10-CM | POA: Diagnosis not present

## 2017-11-20 DIAGNOSIS — M25561 Pain in right knee: Secondary | ICD-10-CM | POA: Insufficient documentation

## 2017-11-20 DIAGNOSIS — M25562 Pain in left knee: Secondary | ICD-10-CM | POA: Diagnosis not present

## 2017-11-20 DIAGNOSIS — M19042 Primary osteoarthritis, left hand: Secondary | ICD-10-CM | POA: Diagnosis not present

## 2017-12-04 DIAGNOSIS — N762 Acute vulvitis: Secondary | ICD-10-CM | POA: Diagnosis not present

## 2017-12-06 DIAGNOSIS — Z7901 Long term (current) use of anticoagulants: Secondary | ICD-10-CM | POA: Diagnosis not present

## 2017-12-18 DIAGNOSIS — J45998 Other asthma: Secondary | ICD-10-CM | POA: Diagnosis not present

## 2017-12-18 DIAGNOSIS — F320 Major depressive disorder, single episode, mild: Secondary | ICD-10-CM | POA: Diagnosis not present

## 2017-12-18 DIAGNOSIS — E7849 Other hyperlipidemia: Secondary | ICD-10-CM | POA: Diagnosis not present

## 2017-12-18 DIAGNOSIS — Z6834 Body mass index (BMI) 34.0-34.9, adult: Secondary | ICD-10-CM | POA: Diagnosis not present

## 2017-12-18 DIAGNOSIS — M797 Fibromyalgia: Secondary | ICD-10-CM | POA: Diagnosis not present

## 2017-12-18 DIAGNOSIS — M1049 Other secondary gout, multiple sites: Secondary | ICD-10-CM | POA: Diagnosis not present

## 2017-12-18 DIAGNOSIS — J3089 Other allergic rhinitis: Secondary | ICD-10-CM | POA: Diagnosis not present

## 2017-12-18 DIAGNOSIS — E038 Other specified hypothyroidism: Secondary | ICD-10-CM | POA: Diagnosis not present

## 2017-12-19 ENCOUNTER — Other Ambulatory Visit: Payer: Self-pay | Admitting: Physician Assistant

## 2017-12-23 ENCOUNTER — Ambulatory Visit: Payer: Medicare HMO | Admitting: Cardiovascular Disease

## 2017-12-23 NOTE — Telephone Encounter (Signed)
Patient advised she will need to update UDS before we can refill. Patient verbalized understanding .

## 2017-12-24 ENCOUNTER — Ambulatory Visit: Payer: Medicare HMO | Admitting: Cardiovascular Disease

## 2017-12-25 ENCOUNTER — Ambulatory Visit (INDEPENDENT_AMBULATORY_CARE_PROVIDER_SITE_OTHER): Payer: Medicare HMO | Admitting: Cardiovascular Disease

## 2017-12-25 ENCOUNTER — Other Ambulatory Visit: Payer: Self-pay

## 2017-12-25 ENCOUNTER — Encounter: Payer: Self-pay | Admitting: Cardiovascular Disease

## 2017-12-25 VITALS — BP 128/81 | HR 68 | Ht 61.0 in | Wt 186.0 lb

## 2017-12-25 DIAGNOSIS — I5032 Chronic diastolic (congestive) heart failure: Secondary | ICD-10-CM

## 2017-12-25 DIAGNOSIS — I471 Supraventricular tachycardia: Secondary | ICD-10-CM

## 2017-12-25 DIAGNOSIS — R002 Palpitations: Secondary | ICD-10-CM

## 2017-12-25 DIAGNOSIS — I2782 Chronic pulmonary embolism: Secondary | ICD-10-CM

## 2017-12-25 DIAGNOSIS — I10 Essential (primary) hypertension: Secondary | ICD-10-CM

## 2017-12-25 NOTE — Progress Notes (Signed)
SUBJECTIVE: The patient presents for routine follow-up. She also has a history of PSVT, sinus tachycardia with palpitations, hypertension, chronic diastolic heart failure, COPD, and pulmonary embolism. She also has anxiety with panic attacks.  Nuclear stress test on 10/15/16 was normal, LVEF greater than 65%.  She was evaluated for shortness of breath at [facility] on 09/10/2017.  I previously reviewed all relevant documentation, labs, and studies.  She denies chest pain.  She seldom has palpitations.  She was recently started on prednisone on 12/18/2017 for sinus issues.  She said she has bad allergies.  She has 2 dogs and does not know if she is allergic to them.  She has a lot of postnasal drip.  She recently had some issues with left shoulder bursitis.  She said she drinks a lot of water due to having to take several medications.    Review of Systems: As per "subjective", otherwise negative.  Allergies  Allergen Reactions  . Adhesive [Tape] Rash  . Codeine Nausea And Vomiting    REACTION: vomiting    Current Outpatient Medications  Medication Sig Dispense Refill  . albuterol (PROVENTIL HFA;VENTOLIN HFA) 108 (90 Base) MCG/ACT inhaler Inhale into the lungs every 6 (six) hours as needed for wheezing or shortness of breath.    Marland Kitchen amLODipine (NORVASC) 5 MG tablet Take 5 mg by mouth daily.    Marland Kitchen atorvastatin (LIPITOR) 40 MG tablet Take 40 mg by mouth daily.    Marland Kitchen azelastine (ASTELIN) 0.1 % nasal spray Place 1 spray into both nostrils daily.    . cetirizine (ZYRTEC) 10 MG tablet Take 10 mg by mouth daily.    . clonazePAM (KLONOPIN) 0.5 MG tablet Take 0.5 mg by mouth at bedtime.     . clotrimazole-betamethasone (LOTRISONE) cream     . COLCRYS 0.6 MG tablet TAKE (1) TABLET BY MOUTH ONCE DAILY. 30 tablet 2  . cycloSPORINE (RESTASIS) 0.05 % ophthalmic emulsion Place 1 drop into both eyes 2 (two) times daily.    . diclofenac (VOLTAREN) 75 MG EC tablet     . DULoxetine (CYMBALTA) 60 MG  capsule     . fluticasone (FLONASE) 50 MCG/ACT nasal spray Place 1 spray into the nose daily.     . furosemide (LASIX) 40 MG tablet TAKE 1 TABLET ONCE DAILY. 30 tablet 6  . guaiFENesin (MUCINEX) 600 MG 12 hr tablet Take 600 mg by mouth 2 (two) times daily as needed.    . hydrOXYzine (VISTARIL) 25 MG capsule Take 25 mg by mouth 3 (three) times daily as needed.     Marland Kitchen ipratropium (ATROVENT) 0.06 % nasal spray Place 1 spray into the nose daily.    Marland Kitchen ipratropium-albuterol (DUONEB) 0.5-2.5 (3) MG/3ML SOLN Take 3 mLs by nebulization.    Marland Kitchen levalbuterol (XOPENEX HFA) 45 MCG/ACT inhaler Inhale 2 puffs into the lungs every 6 (six) hours as needed for wheezing.    Marland Kitchen levocetirizine (XYZAL) 5 MG tablet Take 5 mg by mouth daily.    Marland Kitchen levothyroxine (SYNTHROID, LEVOTHROID) 50 MCG tablet Take 50 mcg by mouth daily before breakfast.     . loperamide (IMODIUM A-D) 2 MG tablet Take 2 mg by mouth 4 (four) times daily as needed for diarrhea or loose stools.    . metoprolol succinate (TOPROL-XL) 25 MG 24 hr tablet Take 1 tablet (25 mg total) by mouth daily.    . montelukast (SINGULAIR) 10 MG tablet Take 1 tablet by mouth daily.    Marland Kitchen nystatin (MYCOSTATIN)  powder Apply topically as needed.    . ondansetron (ZOFRAN) 4 MG tablet     . pantoprazole (PROTONIX) 40 MG tablet Take 40 mg by mouth daily.    Marland Kitchen PARoxetine (PAXIL) 30 MG tablet Take 30 mg by mouth daily.    . potassium chloride (K-DUR) 10 MEQ tablet Take 2 tablets by mouth daily.    . predniSONE (DELTASONE) 10 MG tablet Take 10 mg by mouth as needed (for asthma flare).     . ranitidine (ZANTAC) 300 MG tablet Take 300 mg by mouth at bedtime.    . Sennosides (SENOKOT PO) Take by mouth.    . Simethicone (GAS-X EXTRA STRENGTH) 125 MG CAPS Take 2 capsules by mouth as needed.    . SYMBICORT 160-4.5 MCG/ACT inhaler Inhale 2 puffs into the lungs 2 (two) times daily.    Marland Kitchen tiZANidine (ZANAFLEX) 4 MG tablet Take 4 mg by mouth as needed.    . traMADol (ULTRAM) 50 MG tablet  Take 1-2 tablets (50-100 mg total) by mouth 2 (two) times daily. 120 tablet 0  . VOLTAREN 1 % GEL Apply 1 g topically 2 (two) times daily.    Marland Kitchen warfarin (COUMADIN) 5 MG tablet Take 5 mg by mouth daily. Managed by [place] office  2.5mg  Monday thru Friday. 5 mg on Saturday and Sunday.     No current facility-administered medications for this visit.     Past Medical History:  Diagnosis Date  . Acid reflux   . Asthma   . Cardiac dysrhythmia, unspecified   . Carotid bruit    Bilateral  . Cataract   . Chest pain, unspecified   . Chronic airway obstruction, not elsewhere classified   . Chronic sinusitis   . Congestive heart failure, unspecified   . DDD (degenerative disc disease), cervical 07/24/2016  . DDD (degenerative disc disease), lumbar 07/24/2016  . Depressive disorder, not elsewhere classified   . 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 Edema    left leg  . Fibromyalgia   . High cholesterol   . History of rheumatoid arthritis   . Obesity, unspecified   . Obstructive lung disease (generalized) ([place])   . Osteoarthritis of both hands 07/24/2016  . Osteoporosis 07/24/2016  . Palpitations   . Pseudogout involving multiple joints   . SVT (supraventricular tachycardia) ([place])   . Syncope    admitted 09/2011  . Thyroid disease   . Unspecified essential hypertension     Past Surgical History:  Procedure Laterality Date  . CARPAL TUNNEL RELEASE    . CATHETER ABLATION  02/1997   @ [place]      Social History   Socioeconomic History  . Marital status: Single    Spouse name: Not on file  . Number of children: Not on file  . Years of education: Not on file  . Highest education level: Not on file  Occupational History  . Not on file  Social Needs  . Financial resource strain: Not on file  . Food insecurity:    Worry: Not on file    Inability: Not on file  . Transportation needs:    Medical: Not on file     Non-medical: Not on file  Tobacco Use  . Smoking status: Never Smoker  . Smokeless tobacco: Never Used  Substance and Sexual Activity  . Alcohol use: No    Alcohol/week: 0.0 oz  . Drug use: No  . Sexual activity: Not  on file  Lifestyle  . Physical activity:    Days per week: Not on file    Minutes per session: Not on file  . Stress: Not on file  Relationships  . Social connections:    Talks on phone: Not on file    Gets together: Not on file    Attends religious service: Not on file    Active member of club or organization: Not on file    Attends meetings of clubs or organizations: Not on file    Relationship status: Not on file  . Intimate partner violence:    Fear of current or ex partner: Not on file    Emotionally abused: Not on file    Physically abused: Not on file    Forced sexual activity: Not on file  Other Topics Concern  . Not on file  Social History Narrative  . Not on file     Vitals:   12/25/17 0929  BP: 128/81  Pulse: 68  SpO2: 99%  Weight: 186 lb (84.4 kg)  Height: 5\' 1"  (1.549 m)    Wt Readings from Last 3 Encounters:  12/25/17 186 lb (84.4 kg)  09/12/17 172 lb (78 kg)  07/31/17 181 lb (82.1 kg)     PHYSICAL EXAM General: NAD HEENT: Normal. Neck: No JVD, no thyromegaly. Lungs: Clear to auscultation bilaterally with normal respiratory effort. CV: Regular rate and rhythm, normal S1/S2, no S3/S4, no murmur. No pretibial or periankle edema.  No carotid bruit.   Abdomen: Soft, nontender, no distention.  Neurologic: Alert and oriented.  Psych: Normal affect. Skin: Normal. Musculoskeletal: No gross deformities.    ECG: Most recent ECG reviewed.   Labs: Lab Results  Component Value Date/Time   K 3.9 07/31/2017 10:11 AM   BUN 19 07/31/2017 10:11 AM   CREATININE 0.95 07/31/2017 10:11 AM   ALT 21 07/31/2017 10:11 AM   HGB 12.8 07/31/2017 10:11 AM     Lipids: No results found for: LDLCALC, LDLDIRECT, CHOL, TRIG, HDL     ASSESSMENT  AND PLAN:  Palpitations/SinusTachycardia/History of PSVT Symptomatically stable without recurrence. Continue metoprolol succinate.  She apparently had a previous radiofrequency ablation in 1998 at [facility] which was unsuccessful. If she were to have a recurrence, I would consider cardiac monitoring.   Chronic diastolic heart failure Euvolemic. No changes to therapy.  Pulmonary embolism Remains on chronic anticoagulation with warfarin.  Essential HTN Controlled. No changes at this time.  Progressive exertional dyspnea/fatigue/abnormal ECG Symptomatically stable.  Normal nuclearstress test as detailed above. No further cardiovascular testing is indicated at this time.      Disposition: Follow up 1 year   Kate Sable, M.D., F.A.C.C.

## 2017-12-25 NOTE — Patient Instructions (Signed)
Your physician wants you to follow-up in: 1 YEAR WITH DR KONESWARAN You will receive a reminder letter in the mail two months in advance. If you don't receive a letter, please call our office to schedule the follow-up appointment.  Your physician recommends that you continue on your current medications as directed. Please refer to the Current Medication list given to you today.  Thank you for choosing [facility]!!    

## 2017-12-27 DIAGNOSIS — Z7901 Long term (current) use of anticoagulants: Secondary | ICD-10-CM | POA: Diagnosis not present

## 2017-12-30 DIAGNOSIS — M546 Pain in thoracic spine: Secondary | ICD-10-CM | POA: Diagnosis not present

## 2017-12-30 DIAGNOSIS — M542 Cervicalgia: Secondary | ICD-10-CM | POA: Diagnosis not present

## 2017-12-30 DIAGNOSIS — M9901 Segmental and somatic dysfunction of cervical region: Secondary | ICD-10-CM | POA: Diagnosis not present

## 2017-12-30 DIAGNOSIS — M9902 Segmental and somatic dysfunction of thoracic region: Secondary | ICD-10-CM | POA: Diagnosis not present

## 2017-12-30 DIAGNOSIS — M9903 Segmental and somatic dysfunction of lumbar region: Secondary | ICD-10-CM | POA: Diagnosis not present

## 2017-12-30 DIAGNOSIS — M545 Low back pain: Secondary | ICD-10-CM | POA: Diagnosis not present

## 2017-12-31 ENCOUNTER — Other Ambulatory Visit: Payer: Self-pay | Admitting: *Deleted

## 2017-12-31 DIAGNOSIS — Z79899 Other long term (current) drug therapy: Secondary | ICD-10-CM | POA: Diagnosis not present

## 2017-12-31 DIAGNOSIS — Z5181 Encounter for therapeutic drug level monitoring: Secondary | ICD-10-CM | POA: Diagnosis not present

## 2017-12-31 LAB — CBC WITH DIFFERENTIAL/PLATELET
Basophils Absolute: 32 cells/uL (ref 0–200)
Basophils Relative: 0.4 %
EOS PCT: 0.9 %
Eosinophils Absolute: 72 cells/uL (ref 15–500)
HEMATOCRIT: 39.2 % (ref 35.0–45.0)
HEMOGLOBIN: 13.2 g/dL (ref 11.7–15.5)
LYMPHS ABS: 3296 {cells}/uL (ref 850–3900)
MCH: 29.4 pg (ref 27.0–33.0)
MCHC: 33.7 g/dL (ref 32.0–36.0)
MCV: 87.3 fL (ref 80.0–100.0)
MPV: 10 fL (ref 7.5–12.5)
Monocytes Relative: 7.1 %
Neutro Abs: 4032 cells/uL (ref 1500–7800)
Neutrophils Relative %: 50.4 %
Platelets: 298 10*3/uL (ref 140–400)
RBC: 4.49 10*6/uL (ref 3.80–5.10)
RDW: 13.3 % (ref 11.0–15.0)
Total Lymphocyte: 41.2 %
WBC mixed population: 568 cells/uL (ref 200–950)
WBC: 8 10*3/uL (ref 3.8–10.8)

## 2018-01-01 LAB — COMPLETE METABOLIC PANEL WITH GFR
AG Ratio: 1.6 (calc) (ref 1.0–2.5)
ALKALINE PHOSPHATASE (APISO): 109 U/L (ref 33–130)
ALT: 22 U/L (ref 6–29)
AST: 16 U/L (ref 10–35)
Albumin: 4.2 g/dL (ref 3.6–5.1)
BUN: 9 mg/dL (ref 7–25)
CHLORIDE: 103 mmol/L (ref 98–110)
CO2: 30 mmol/L (ref 20–32)
Calcium: 9.2 mg/dL (ref 8.6–10.4)
Creat: 0.94 mg/dL (ref 0.50–0.99)
GFR, Est African American: 74 mL/min/{1.73_m2} (ref >=60)
GFR, Est Non African American: 64 mL/min/{1.73_m2} (ref >=60)
GLUCOSE: 86 mg/dL (ref 65–99)
Globulin: 2.7 g/dL (calc) (ref 1.9–3.7)
Potassium: 4.1 mmol/L (ref 3.5–5.3)
Sodium: 142 mmol/L (ref 135–146)
Total Bilirubin: 0.8 mg/dL (ref 0.2–1.2)
Total Protein: 6.9 g/dL (ref 6.1–8.1)

## 2018-01-02 ENCOUNTER — Other Ambulatory Visit: Payer: Self-pay | Admitting: Rheumatology

## 2018-01-03 NOTE — Telephone Encounter (Signed)
Last Visit: 07/31/17 Next Visit 01/29/18 Labs: 12/31/17 CBC and CMP WNL.  Okay to refill per Dr. Estanislado Pandy

## 2018-01-07 ENCOUNTER — Other Ambulatory Visit: Payer: Self-pay

## 2018-01-07 ENCOUNTER — Other Ambulatory Visit: Payer: Self-pay | Admitting: *Deleted

## 2018-01-07 DIAGNOSIS — Z5181 Encounter for therapeutic drug level monitoring: Secondary | ICD-10-CM | POA: Diagnosis not present

## 2018-01-07 DIAGNOSIS — Z79899 Other long term (current) drug therapy: Secondary | ICD-10-CM

## 2018-01-10 LAB — PAIN MGMT, PROFILE 5 W/CONF, U
ALPHAHYDROXYALPRAZOLAM: NEGATIVE ng/mL (ref <25)
ALPHAHYDROXYMIDAZOLAM: NEGATIVE ng/mL (ref <50)
AMPHETAMINES: NEGATIVE ng/mL (ref <500)
Alphahydroxytriazolam: NEGATIVE ng/mL (ref <50)
Aminoclonazepam: NEGATIVE ng/mL (ref <25)
BENZODIAZEPINES: NEGATIVE ng/mL (ref <100)
Barbiturates: NEGATIVE ng/mL (ref <300)
CREATININE: 126 mg/dL
Cocaine Metabolite: NEGATIVE ng/mL (ref <150)
HYDROXYETHYLFLURAZEPAM: NEGATIVE ng/mL (ref <50)
Lorazepam: NEGATIVE ng/mL (ref <50)
MARIJUANA METABOLITE: NEGATIVE ng/mL (ref <20)
METHADONE METABOLITE: NEGATIVE ng/mL (ref <100)
NORDIAZEPAM: NEGATIVE ng/mL (ref <50)
OXIDANT: NEGATIVE ug/mL (ref <200)
Opiates: NEGATIVE ng/mL (ref <100)
Oxazepam: NEGATIVE ng/mL (ref <50)
Oxycodone: NEGATIVE ng/mL (ref <100)
PH: 6.73 (ref 4.5–9.0)
Temazepam: NEGATIVE ng/mL (ref <50)

## 2018-01-10 LAB — PAIN MGMT, TRAMADOL W/MEDMATCH, U
DESMETHYLTRAMADOL: NEGATIVE ng/mL (ref <100)
TRAMADOL: NEGATIVE ng/mL (ref <100)

## 2018-01-10 NOTE — Progress Notes (Signed)
C/w

## 2018-01-15 ENCOUNTER — Telehealth: Payer: Self-pay | Admitting: Rheumatology

## 2018-01-15 MED ORDER — TRAMADOL HCL 50 MG PO TABS: 50.0000 mg | ORAL_TABLET | Freq: Two times a day (BID) | ORAL | 0 refills | Status: DC

## 2018-01-15 NOTE — Progress Notes (Signed)
Office Visit Note  Patient: Dorothy Ford             Date of Birth: 05/21/1951           MRN: [id number]             PCP: Neale Burly, MD Referring: Neale Burly, MD Visit Date: 01/29/2018 Occupation: @GUAROCC @    Subjective:  Left shoulder pain    History of Present Illness: Dorothy Ford is a 67 y.o. female with history of pseudogout, osteoarthritis, DDD, and fibromyalgia.  She continues to take Colcrys 0.6 mg on a daily basis.  She denies any pseudogout flares.  She has been having some discomfort in bilateral elbow joints from previous damage.  She states her main concern today is left shoulder pain that started a few months ago.  She denies any injury.  She has been having pain at night especially if she lays on her left side.  She has noticed limited ROM of the left shoulder.  She states she has radiation of pain down the left arm.  She continues to have chronic pain in neck and lower back.  She has limited ROM of neck and lower back.  She states she has occasional hand stiffness.  She reports her fibromyalgia has been well controlled.  She continues to take Tramadol 2 tablets in the morning and 2 tablets at bedtime.  She states that before her last UDS she had run out of her prescription.   Activities of Daily Living:  Patient reports morning stiffness for 15 minutes.   Patient Reports nocturnal pain.  Difficulty dressing/grooming: Reports Difficulty climbing stairs: Reports Difficulty getting out of chair: Reports Difficulty using hands for taps, buttons, cutlery, and/or writing: Reports   Review of Systems  Constitutional: Positive for fatigue.  HENT: Positive for mouth dryness. Negative for mouth sores and nose dryness.   Eyes: Positive for dryness. Negative for pain and visual disturbance.  Respiratory: Positive for cough. Negative for hemoptysis, shortness of breath and difficulty breathing.   Cardiovascular: Positive for swelling in legs/feet. Negative  for chest pain, palpitations and hypertension.  Gastrointestinal: Negative for blood in stool, constipation and diarrhea.  Endocrine: Negative for increased urination.  Genitourinary: Negative for painful urination and pelvic pain.  Musculoskeletal: Positive for arthralgias, joint pain and morning stiffness. Negative for joint swelling, myalgias, muscle weakness, muscle tenderness and myalgias.  Skin: Negative for color change, pallor, rash, hair loss, nodules/bumps, skin tightness, ulcers and sensitivity to sunlight.  Allergic/Immunologic: Negative for susceptible to infections.  Neurological: Negative for numbness and headaches.  Hematological: Negative for swollen glands.  Psychiatric/Behavioral: Negative for depressed mood, confusion and sleep disturbance. The patient is not nervous/anxious.     PMFS History:  Patient Active Problem List   Diagnosis Date Noted  . 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 Chondromalacia patellae, right knee 01/15/2017  . Osteoarthritis of both hands 07/24/2016  . DDD (degenerative disc disease), cervical 07/24/2016  . DDD (degenerative disc disease), lumbar 07/24/2016  . Osteoporosis 07/24/2016  . Glaucoma 07/24/2016  . Sleep apnea 07/24/2016  . Pseudogout 07/19/2016  . Primary osteoarthritis of both knees 07/19/2016  . Fibromyalgia 07/19/2016  . 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 Pulmonary embolism ([place]) 01/18/2012  . Anxiety 01/18/2012  . Syncope   . RHEUMATOID LUNG 12/30/2009  . OBESITY, UNSPECIFIED 06/02/2009  . DEPRESSIVE DISORDER NOT ELSEWHERE CLASSIFIED 06/02/2009  . SUPRAVENTRICULAR TACHYCARDIA 06/02/2009  . Chronic diastolic heart failure ([place]) 06/02/2009  . COPD 06/02/2009  .  DIAPHRAGMAT HERN W/O MENTION OBSTRUCTION/GANGREN 06/02/2009  . PALPITATIONS 06/02/2009  . CHEST PAIN UNSPECIFIED 06/02/2009    Past Medical History:  Diagnosis Date  . Acid reflux   . Asthma   . Cardiac dysrhythmia, unspecified   . Carotid bruit     Bilateral  . Cataract   . Chest pain, unspecified   . Chronic airway obstruction, not elsewhere classified   . Chronic sinusitis   . Congestive heart failure, unspecified   . DDD (degenerative disc disease), cervical 07/24/2016  . DDD (degenerative disc disease), lumbar 07/24/2016  . Depressive disorder, not elsewhere classified   . 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 Edema    left leg  . Fibromyalgia   . High cholesterol   . History of rheumatoid arthritis   . Obesity, unspecified   . Obstructive lung disease (generalized) ([place])   . Osteoarthritis of both hands 07/24/2016  . Osteoporosis 07/24/2016  . Palpitations   . Pseudogout involving multiple joints   . SVT (supraventricular tachycardia) ([place])   . Syncope    admitted 09/2011  . Thyroid disease   . Unspecified essential hypertension     Family History  Problem Relation Age of Onset  . Heart disease Mother   . Stroke Unknown   . Heart disease Father    Past Surgical History:  Procedure Laterality Date  . CARPAL TUNNEL RELEASE    . CATHETER ABLATION  02/1997   @ [place]     Social History   Social History Narrative  . Not on file     Objective: Vital Signs: BP 113/71 (BP Location: Right Arm, Patient Position: Sitting, Cuff Size: Normal)   Pulse (!) 59   Resp 16   Ht 5\' 1"  (1.549 m)   Wt 185 lb (83.9 kg)   BMI 34.96 kg/m    Physical Exam  Constitutional: She is oriented to person, place, and time. She appears well-developed and well-nourished.  HENT:  Head: Normocephalic and atraumatic.  Eyes: Conjunctivae and EOM are normal.  Neck: Normal range of motion.  Cardiovascular: Normal rate, regular rhythm, normal heart sounds and intact distal pulses.  Pulmonary/Chest: Effort normal and breath sounds normal.  Abdominal: Soft. Bowel sounds are normal.  Lymphadenopathy:    She has no cervical adenopathy.  Neurological: She is alert and oriented to  person, place, and time.  Skin: Skin is warm and dry. Capillary refill takes less than 2 seconds.  Psychiatric: She has a normal mood and affect. Her behavior is normal.  Nursing note and vitals reviewed.    Musculoskeletal Exam: C-spine limited ROM. Thoracic kyphosis.  Limited ROM of lumbar spine.  Right shoulder full ROM.  Left shoulder abduction to 90 degrees with discomfort.  Elbow joints good ROM with no synovitis.  Wrist joints, MCPs, PIPs, and DIPs good ROM with no synovitis.  Hip joints, knee joints, and ankle joints good ROM.  No tenderness of trochanteric bursa.  No warmth or effusion of knee joints.   CDAI Exam: No CDAI exam completed.    Investigation: No additional findings. CBC Latest Ref Rng & Units 12/31/2017 07/31/2017  WBC 3.8 - 10.8 Thousand/uL 8.0 6.7  Hemoglobin 11.7 - 15.5 g/dL 13.2 12.8  Hematocrit 35.0 - 45.0 % 39.2 38.4  Platelets 140 - 400 Thousand/uL 298 295   CMP Latest Ref Rng & Units 12/31/2017 07/31/2017  Glucose 65 - 99 mg/dL 86 96  BUN 7 - 25 mg/dL 9  19  Creatinine 0.50 - 0.99 mg/dL 0.94 0.95  Sodium 135 - 146 mmol/L 142 141  Potassium 3.5 - 5.3 mmol/L 4.1 3.9  Chloride 98 - 110 mmol/L 103 103  CO2 20 - 32 mmol/L 30 31  Calcium 8.6 - 10.4 mg/dL 9.2 9.2  Total Protein 6.1 - 8.1 g/dL 6.9 7.0  Total Bilirubin 0.2 - 1.2 mg/dL 0.8 0.5  AST 10 - 35 U/L 16 16  ALT 6 - 29 U/L 22 21    Imaging: Xr Shoulder Left  Result Date: 01/29/2018 No glenohumeral joint space narrowing was noted.  Spurring was noted.  Type II acromion was noted.   Speciality Comments: No specialty comments available.    Procedures:  Large Joint Inj: L glenohumeral on 01/29/2018 12:28 PM Indications: pain Details: 27 G 1.5 in needle, posterior approach  Arthrogram: No  Medications: 1.5 mL lidocaine 1 %; 40 mg triamcinolone acetonide 40 MG/ML Aspirate: 0 mL Outcome: tolerated well, no immediate complications Procedure, treatment alternatives, risks and benefits explained,  specific risks discussed. Consent was given by the patient. Immediately prior to procedure a time out was called to verify the correct patient, procedure, equipment, support staff and site/side marked as required. Patient was prepped and draped in the usual sterile fashion.     Allergies: Adhesive [tape] and Codeine   Assessment / Plan:     Visit Diagnoses: Pseudogout -She has not had any recent flares of pseudogout. She has occasional discomfort in bilateral elbows but no synovitis or tenderness on exam. She continues to take colcrys 0.6 mg daily.  She does not need any refills at this time.   Primary osteoarthritis of both hands:  She has occasional hand stiffness but no synovitis.  Joint protection and muscle strengthening were discussed.    Primary osteoarthritis of both knees - With chondromalacia patella: No warmth or effusion of knee joints. She has no discomfort in her knees at this time.    DDD (degenerative disc disease), cervical: Chronic pain and limited ROM.   DDD (degenerative disc disease), lumbar: Chronic pain.  She has midline spinal tenderness in the lumbar region.   Fibromyalgia -Her fibromyalgia has been well controlled.  She has mild muscle tenderness and muscle aches.  She continues to have chronic fatigue.  She takes tramadol 2 tablets in the morning and 2 tablets at bedtime. She ran out of her last prescription prior to having the UDS performed. UDS was negative on 05/02/17 and 01/07/18 but both times she had run out of the prescription.   The refill prior to the UDS performed on 01/17/18 was on 09/13/17 which was for 120 tablets.  We will continue to monitor UDS on regular basis.  We stressed the importance of coming for regular UDS and taking medications as prescribed.  She is in understanding with the plan.  UDS: 01/17/2018. Narc agreement: 12/31/2017  Chronic left shoulder pain -She is been having increased pain in her left shoulder for the past few months.  She denies any  injuries.  She has limited range of motion with abduction to 90 degrees.  An x-ray was obtained today.  X-ray revealed findings consistent with a type II acromion. A cortisone injection was performed in the office today.  She tolerated procedure well.  Potential side effects were discussed.  She was advised to monitor her blood pressure closely upon the cortisone injection today.  She is given a handout of shoulder exercises that she can perform at home.  Plan: XR Shoulder  Left   Osteopenia of multiple sites - DEXA 02/18/2014 T score -1.0. Use of calcium and vitamin D was discussed.  Other medical conditions are listed as follows:   History of sleep apnea  History of COPD  History of CHF (congestive heart failure)  History of glaucoma  History of pulmonary embolism     Orders: Orders Placed This Encounter  Procedures  . Large Joint Inj  . XR Shoulder Left   No orders of the defined types were placed in this encounter.   Face-to-face time spent with patient was 30 minutes. >50% of time was spent in counseling and coordination of care.  Follow-Up Instructions: Return in about 6 months (around 08/01/2018) for Pseudogout, Osteoarthritis, Fibromyalgia, DDD.   Ofilia Neas, PA-C   I examined and evaluated the patient with Dorothy Sams PA.  Patient had painful range of motion of her left shoulder on my examination today.  Her findings were consistent with shoulder joint bursitis.  X-ray of the shoulder joint was unremarkable except for some spurring.  I injected her left shoulder with the cortisone as described above.  She tolerated the procedure well.  The plan of care was discussed as noted above.  Bo Merino, MD  Note - This record has been created using Editor, commissioning.  Chart creation errors have been sought, but may not always  have been located. Such creation errors do not reflect on  the standard of medical care.

## 2018-01-15 NOTE — Telephone Encounter (Signed)
Patient called requesting the results from her labwork on 01/07/18.

## 2018-01-15 NOTE — Telephone Encounter (Signed)
Patient advised of lab results and request a refill on Tramadol.   Last Visit: 07/31/17 Next Visit: 01/29/18 UDS: 01/07/18 Narc Agreement: 01/13/18   Okay to refill Tramadol?

## 2018-01-17 DIAGNOSIS — Z7901 Long term (current) use of anticoagulants: Secondary | ICD-10-CM | POA: Diagnosis not present

## 2018-01-28 DIAGNOSIS — J3489 Other specified disorders of nose and nasal sinuses: Secondary | ICD-10-CM | POA: Diagnosis not present

## 2018-01-28 DIAGNOSIS — J301 Allergic rhinitis due to pollen: Secondary | ICD-10-CM | POA: Diagnosis not present

## 2018-01-28 DIAGNOSIS — J4540 Moderate persistent asthma, uncomplicated: Secondary | ICD-10-CM | POA: Diagnosis not present

## 2018-01-28 DIAGNOSIS — J329 Chronic sinusitis, unspecified: Secondary | ICD-10-CM | POA: Diagnosis not present

## 2018-01-28 DIAGNOSIS — Z87891 Personal history of nicotine dependence: Secondary | ICD-10-CM | POA: Diagnosis not present

## 2018-01-29 ENCOUNTER — Ambulatory Visit (INDEPENDENT_AMBULATORY_CARE_PROVIDER_SITE_OTHER): Payer: Medicare HMO | Admitting: Rheumatology

## 2018-01-29 ENCOUNTER — Encounter: Payer: Self-pay | Admitting: Rheumatology

## 2018-01-29 ENCOUNTER — Ambulatory Visit (INDEPENDENT_AMBULATORY_CARE_PROVIDER_SITE_OTHER): Payer: Medicare HMO

## 2018-01-29 VITALS — BP 113/71 | HR 59 | Resp 16 | Ht 61.0 in | Wt 185.0 lb

## 2018-01-29 DIAGNOSIS — Z8669 Personal history of other diseases of the nervous system and sense organs: Secondary | ICD-10-CM | POA: Diagnosis not present

## 2018-01-29 DIAGNOSIS — M170 Bilateral primary osteoarthritis of knee: Secondary | ICD-10-CM

## 2018-01-29 DIAGNOSIS — M1120 Other chondrocalcinosis, unspecified site: Secondary | ICD-10-CM | POA: Diagnosis not present

## 2018-01-29 DIAGNOSIS — M797 Fibromyalgia: Secondary | ICD-10-CM

## 2018-01-29 DIAGNOSIS — Z8709 Personal history of other diseases of the respiratory system: Secondary | ICD-10-CM

## 2018-01-29 DIAGNOSIS — M25512 Pain in left shoulder: Secondary | ICD-10-CM

## 2018-01-29 DIAGNOSIS — M19042 Primary osteoarthritis, left hand: Secondary | ICD-10-CM

## 2018-01-29 DIAGNOSIS — Z8679 Personal history of other diseases of the circulatory system: Secondary | ICD-10-CM | POA: Diagnosis not present

## 2018-01-29 DIAGNOSIS — M19041 Primary osteoarthritis, right hand: Secondary | ICD-10-CM

## 2018-01-29 DIAGNOSIS — M8589 Other specified disorders of bone density and structure, multiple sites: Secondary | ICD-10-CM

## 2018-01-29 DIAGNOSIS — M5136 Other intervertebral disc degeneration, lumbar region: Secondary | ICD-10-CM

## 2018-01-29 DIAGNOSIS — Z86711 Personal history of pulmonary embolism: Secondary | ICD-10-CM | POA: Diagnosis not present

## 2018-01-29 DIAGNOSIS — G8929 Other chronic pain: Secondary | ICD-10-CM

## 2018-01-29 DIAGNOSIS — M5030 Other cervical disc degeneration, unspecified cervical region: Secondary | ICD-10-CM

## 2018-01-29 MED ORDER — LIDOCAINE HCL 1 % IJ SOLN
1.5000 mL | INTRAMUSCULAR | Status: AC | PRN
Start: 2018-01-29 — End: 2018-01-29
  Administered 2018-01-29: 1.5 mL

## 2018-01-29 MED ORDER — TRIAMCINOLONE ACETONIDE 40 MG/ML IJ SUSP
40.0000 mg | INTRAMUSCULAR | Status: AC | PRN
  Administered 2018-01-29: 40 mg via INTRA_ARTICULAR

## 2018-01-29 NOTE — Patient Instructions (Signed)
Shoulder Exercises Ask your health care provider which exercises are safe for you. Do exercises exactly as told by your health care provider and adjust them as directed. It is normal to feel mild stretching, pulling, tightness, or discomfort as you do these exercises, but you should stop right away if you feel sudden pain or your pain gets worse.Do not begin these exercises until told by your health care provider. RANGE OF MOTION EXERCISES These exercises warm up your muscles and joints and improve the movement and flexibility of your shoulder. These exercises also help to relieve pain, numbness, and tingling. These exercises involve stretching your injured shoulder directly. Exercise A: Pendulum  1. Stand near a wall or a surface that you can hold onto for balance. 2. Bend at the waist and let your left / right arm hang straight down. Use your other arm to support you. Keep your back straight and do not lock your knees. 3. Relax your left / right arm and shoulder muscles, and move your hips and your trunk so your left / right arm swings freely. Your arm should swing because of the motion of your body, not because you are using your arm or shoulder muscles. 4. Keep moving your body so your arm swings in the following directions, as told by your health care provider: ? Side to side. ? Forward and backward. ? In clockwise and counterclockwise circles. 5. Continue each motion for __________ seconds, or for as long as told by your health care provider. 6. Slowly return to the starting position. Repeat __________ times. Complete this exercise __________ times a day. Exercise B:Flexion, Standing  1. Stand and hold a broomstick, a cane, or a similar object. Place your hands a little more than shoulder-width apart on the object. Your left / right hand should be palm-up, and your other hand should be palm-down. 2. Keep your elbow straight and keep your shoulder muscles relaxed. Push the stick down with  your healthy arm to raise your left / right arm in front of your body, and then over your head until you feel a stretch in your shoulder. ? Avoid shrugging your shoulder while you raise your arm. Keep your shoulder blade tucked down toward the middle of your back. 3. Hold for __________ seconds. 4. Slowly return to the starting position. Repeat __________ times. Complete this exercise __________ times a day. Exercise C: Abduction, Standing 1. Stand and hold a broomstick, a cane, or a similar object. Place your hands a little more than shoulder-width apart on the object. Your left / right hand should be palm-up, and your other hand should be palm-down. 2. While keeping your elbow straight and your shoulder muscles relaxed, push the stick across your body toward your left / right side. Raise your left / right arm to the side of your body and then over your head until you feel a stretch in your shoulder. ? Do not raise your arm above shoulder height, unless your health care provider tells you to do that. ? Avoid shrugging your shoulder while you raise your arm. Keep your shoulder blade tucked down toward the middle of your back. 3. Hold for __________ seconds. 4. Slowly return to the starting position. Repeat __________ times. Complete this exercise __________ times a day. Exercise D:Internal Rotation  1. Place your left / right hand behind your back, palm-up. 2. Use your other hand to dangle an exercise band, a towel, or a similar object over your shoulder. Grasp the band with   your left / right hand so you are holding onto both ends. 3. Gently pull up on the band until you feel a stretch in the front of your left / right shoulder. ? Avoid shrugging your shoulder while you raise your arm. Keep your shoulder blade tucked down toward the middle of your back. 4. Hold for __________ seconds. 5. Release the stretch by letting go of the band and lowering your hands. Repeat __________ times. Complete  this exercise __________ times a day. STRETCHING EXERCISES These exercises warm up your muscles and joints and improve the movement and flexibility of your shoulder. These exercises also help to relieve pain, numbness, and tingling. These exercises are done using your healthy shoulder to help stretch the muscles of your injured shoulder. Exercise E: Corner Stretch (External Rotation and Abduction)  1. Stand in a doorway with one of your feet slightly in front of the other. This is called a staggered stance. If you cannot reach your forearms to the door frame, stand facing a corner of a room. 2. Choose one of the following positions as told by your health care provider: ? Place your hands and forearms on the door frame above your head. ? Place your hands and forearms on the door frame at the height of your head. ? Place your hands on the door frame at the height of your elbows. 3. Slowly move your weight onto your front foot until you feel a stretch across your chest and in the front of your shoulders. Keep your head and chest upright and keep your abdominal muscles tight. 4. Hold for __________ seconds. 5. To release the stretch, shift your weight to your back foot. Repeat __________ times. Complete this stretch __________ times a day. Exercise F:Extension, Standing 1. Stand and hold a broomstick, a cane, or a similar object behind your back. ? Your hands should be a little wider than shoulder-width apart. ? Your palms should face away from your back. 2. Keeping your elbows straight and keeping your shoulder muscles relaxed, move the stick away from your body until you feel a stretch in your shoulder. ? Avoid shrugging your shoulders while you move the stick. Keep your shoulder blade tucked down toward the middle of your back. 3. Hold for __________ seconds. 4. Slowly return to the starting position. Repeat __________ times. Complete this exercise __________ times a day. STRENGTHENING  EXERCISES These exercises build strength and endurance in your shoulder. Endurance is the ability to use your muscles for a long time, even after they get tired. Exercise G:External Rotation  1. Sit in a stable chair without armrests. 2. Secure an exercise band at elbow height on your left / right side. 3. Place a soft object, such as a folded towel or a small pillow, between your left / right upper arm and your body to move your elbow a few inches away (about 10 cm) from your side. 4. Hold the end of the band so it is tight and there is no slack. 5. Keeping your elbow pressed against the soft object, move your left / right forearm out, away from your abdomen. Keep your body steady so only your forearm moves. 6. Hold for __________ seconds. 7. Slowly return to the starting position. Repeat __________ times. Complete this exercise __________ times a day. Exercise H:Shoulder Abduction  1. Sit in a stable chair without armrests, or stand. 2. Hold a __________ weight in your left / right hand, or hold an exercise band with both hands.   3. Start with your arms straight down and your left / right palm facing in, toward your body. 4. Slowly lift your left / right hand out to your side. Do not lift your hand above shoulder height unless your health care provider tells you that this is safe. ? Keep your arms straight. ? Avoid shrugging your shoulder while you do this movement. Keep your shoulder blade tucked down toward the middle of your back. 5. Hold for __________ seconds. 6. Slowly lower your arm, and return to the starting position. Repeat __________ times. Complete this exercise __________ times a day. Exercise I:Shoulder Extension 1. Sit in a stable chair without armrests, or stand. 2. Secure an exercise band to a stable object in front of you where it is at shoulder height. 3. Hold one end of the exercise band in each hand. Your palms should face each other. 4. Straighten your elbows and  lift your hands up to shoulder height. 5. Step back, away from the secured end of the exercise band, until the band is tight and there is no slack. 6. Squeeze your shoulder blades together as you pull your hands down to the sides of your thighs. Stop when your hands are straight down by your sides. Do not let your hands go behind your body. 7. Hold for __________ seconds. 8. Slowly return to the starting position. Repeat __________ times. Complete this exercise __________ times a day. Exercise J:Standing Shoulder Row 1. Sit in a stable chair without armrests, or stand. 2. Secure an exercise band to a stable object in front of you so it is at waist height. 3. Hold one end of the exercise band in each hand. Your palms should be in a thumbs-up position. 4. Bend each of your elbows to an "L" shape (about 90 degrees) and keep your upper arms at your sides. 5. Step back until the band is tight and there is no slack. 6. Slowly pull your elbows back behind you. 7. Hold for __________ seconds. 8. Slowly return to the starting position. Repeat __________ times. Complete this exercise __________ times a day. Exercise K:Shoulder Press-Ups  1. Sit in a stable chair that has armrests. Sit upright, with your feet flat on the floor. 2. Put your hands on the armrests so your elbows are bent and your fingers are pointing forward. Your hands should be about even with the sides of your body. 3. Push down on the armrests and use your arms to lift yourself off of the chair. Straighten your elbows and lift yourself up as much as you comfortably can. ? Move your shoulder blades down, and avoid letting your shoulders move up toward your ears. ? Keep your feet on the ground. As you get stronger, your feet should support less of your body weight as you lift yourself up. 4. Hold for __________ seconds. 5. Slowly lower yourself back into the chair. Repeat __________ times. Complete this exercise __________ times a  day. Exercise L: Wall Push-Ups  1. Stand so you are facing a stable wall. Your feet should be about one arm-length away from the wall. 2. Lean forward and place your palms on the wall at shoulder height. 3. Keep your feet flat on the floor as you bend your elbows and lean forward toward the wall. 4. Hold for __________ seconds. 5. Straighten your elbows to push yourself back to the starting position. Repeat __________ times. Complete this exercise __________ times a day. This information is not intended to replace advice   given to you by your health care provider. Make sure you discuss any questions you have with your health care provider. Document Released: 07/04/2005 Document Revised: 05/14/2016 Document Reviewed: 05/01/2015 Elsevier Interactive Patient Education  2018 [employer]  

## 2018-02-12 ENCOUNTER — Other Ambulatory Visit: Payer: Self-pay | Admitting: Rheumatology

## 2018-02-12 NOTE — Telephone Encounter (Signed)
Last Visit: 01/29/18 Next Visit: 08/04/18 Labs: 12/31/17 CBC and CMP WNL.  Okay to refill per Dr. Deveshwar  

## 2018-02-14 DIAGNOSIS — E038 Other specified hypothyroidism: Secondary | ICD-10-CM | POA: Diagnosis not present

## 2018-02-14 DIAGNOSIS — Z6834 Body mass index (BMI) 34.0-34.9, adult: Secondary | ICD-10-CM | POA: Diagnosis not present

## 2018-02-14 DIAGNOSIS — F320 Major depressive disorder, single episode, mild: Secondary | ICD-10-CM | POA: Diagnosis not present

## 2018-02-14 DIAGNOSIS — M1049 Other secondary gout, multiple sites: Secondary | ICD-10-CM | POA: Diagnosis not present

## 2018-02-14 DIAGNOSIS — J3089 Other allergic rhinitis: Secondary | ICD-10-CM | POA: Diagnosis not present

## 2018-02-14 DIAGNOSIS — E7849 Other hyperlipidemia: Secondary | ICD-10-CM | POA: Diagnosis not present

## 2018-02-14 DIAGNOSIS — M797 Fibromyalgia: Secondary | ICD-10-CM | POA: Diagnosis not present

## 2018-02-14 DIAGNOSIS — Z7901 Long term (current) use of anticoagulants: Secondary | ICD-10-CM | POA: Diagnosis not present

## 2018-02-14 DIAGNOSIS — J45998 Other asthma: Secondary | ICD-10-CM | POA: Diagnosis not present

## 2018-02-20 DIAGNOSIS — M25519 Pain in unspecified shoulder: Secondary | ICD-10-CM | POA: Diagnosis not present

## 2018-02-20 DIAGNOSIS — M25512 Pain in left shoulder: Secondary | ICD-10-CM | POA: Diagnosis not present

## 2018-02-20 DIAGNOSIS — M19012 Primary osteoarthritis, left shoulder: Secondary | ICD-10-CM | POA: Diagnosis not present

## 2018-02-20 DIAGNOSIS — M1990 Unspecified osteoarthritis, unspecified site: Secondary | ICD-10-CM | POA: Insufficient documentation

## 2018-02-25 DIAGNOSIS — Z6834 Body mass index (BMI) 34.0-34.9, adult: Secondary | ICD-10-CM | POA: Diagnosis not present

## 2018-02-25 DIAGNOSIS — M25512 Pain in left shoulder: Secondary | ICD-10-CM | POA: Diagnosis not present

## 2018-02-25 DIAGNOSIS — Z0000 Encounter for general adult medical examination without abnormal findings: Secondary | ICD-10-CM | POA: Diagnosis not present

## 2018-03-01 ENCOUNTER — Other Ambulatory Visit: Payer: Self-pay | Admitting: Cardiovascular Disease

## 2018-03-03 DIAGNOSIS — Z0000 Encounter for general adult medical examination without abnormal findings: Secondary | ICD-10-CM | POA: Diagnosis not present

## 2018-03-03 DIAGNOSIS — Z6834 Body mass index (BMI) 34.0-34.9, adult: Secondary | ICD-10-CM | POA: Diagnosis not present

## 2018-03-03 DIAGNOSIS — M25512 Pain in left shoulder: Secondary | ICD-10-CM | POA: Diagnosis not present

## 2018-03-07 DIAGNOSIS — Z0000 Encounter for general adult medical examination without abnormal findings: Secondary | ICD-10-CM | POA: Diagnosis not present

## 2018-03-07 DIAGNOSIS — Z6834 Body mass index (BMI) 34.0-34.9, adult: Secondary | ICD-10-CM | POA: Diagnosis not present

## 2018-03-07 DIAGNOSIS — M25512 Pain in left shoulder: Secondary | ICD-10-CM | POA: Diagnosis not present

## 2018-03-07 DIAGNOSIS — Z7901 Long term (current) use of anticoagulants: Secondary | ICD-10-CM | POA: Diagnosis not present

## 2018-03-10 DIAGNOSIS — S8011XA Contusion of right lower leg, initial encounter: Secondary | ICD-10-CM | POA: Diagnosis not present

## 2018-03-10 DIAGNOSIS — Z6837 Body mass index (BMI) 37.0-37.9, adult: Secondary | ICD-10-CM | POA: Diagnosis not present

## 2018-03-15 ENCOUNTER — Other Ambulatory Visit: Payer: Self-pay | Admitting: Rheumatology

## 2018-03-17 DIAGNOSIS — I110 Hypertensive heart disease with heart failure: Secondary | ICD-10-CM | POA: Diagnosis not present

## 2018-03-17 DIAGNOSIS — K828 Other specified diseases of gallbladder: Secondary | ICD-10-CM | POA: Diagnosis not present

## 2018-03-17 DIAGNOSIS — J45909 Unspecified asthma, uncomplicated: Secondary | ICD-10-CM | POA: Diagnosis not present

## 2018-03-17 DIAGNOSIS — M109 Gout, unspecified: Secondary | ICD-10-CM | POA: Diagnosis not present

## 2018-03-17 DIAGNOSIS — F329 Major depressive disorder, single episode, unspecified: Secondary | ICD-10-CM | POA: Diagnosis not present

## 2018-03-17 DIAGNOSIS — R42 Dizziness and giddiness: Secondary | ICD-10-CM | POA: Diagnosis not present

## 2018-03-17 DIAGNOSIS — K219 Gastro-esophageal reflux disease without esophagitis: Secondary | ICD-10-CM | POA: Diagnosis not present

## 2018-03-17 DIAGNOSIS — R110 Nausea: Secondary | ICD-10-CM | POA: Diagnosis not present

## 2018-03-17 DIAGNOSIS — I5030 Unspecified diastolic (congestive) heart failure: Secondary | ICD-10-CM | POA: Diagnosis not present

## 2018-03-17 DIAGNOSIS — Z86711 Personal history of pulmonary embolism: Secondary | ICD-10-CM | POA: Diagnosis not present

## 2018-03-17 DIAGNOSIS — B952 Enterococcus as the cause of diseases classified elsewhere: Secondary | ICD-10-CM | POA: Diagnosis not present

## 2018-03-17 DIAGNOSIS — N390 Urinary tract infection, site not specified: Secondary | ICD-10-CM | POA: Diagnosis not present

## 2018-03-17 DIAGNOSIS — M1009 Idiopathic gout, multiple sites: Secondary | ICD-10-CM | POA: Diagnosis not present

## 2018-03-17 DIAGNOSIS — R0789 Other chest pain: Secondary | ICD-10-CM | POA: Diagnosis not present

## 2018-03-17 NOTE — Telephone Encounter (Signed)
Last Visit: 01/29/18 Next Visit: 08/04/18 Labs: 12/31/17 CBC and CMP WNL.  Okay to refill per Dr. Estanislado Pandy

## 2018-03-18 DIAGNOSIS — N390 Urinary tract infection, site not specified: Secondary | ICD-10-CM | POA: Diagnosis not present

## 2018-03-18 DIAGNOSIS — Z86711 Personal history of pulmonary embolism: Secondary | ICD-10-CM | POA: Diagnosis not present

## 2018-03-18 DIAGNOSIS — K219 Gastro-esophageal reflux disease without esophagitis: Secondary | ICD-10-CM | POA: Diagnosis not present

## 2018-03-18 DIAGNOSIS — R0789 Other chest pain: Secondary | ICD-10-CM | POA: Diagnosis not present

## 2018-03-18 DIAGNOSIS — M1009 Idiopathic gout, multiple sites: Secondary | ICD-10-CM | POA: Diagnosis not present

## 2018-03-21 ENCOUNTER — Ambulatory Visit (HOSPITAL_COMMUNITY)
Admission: RE | Admit: 2018-03-21 | Discharge: 2018-03-21 | Disposition: A | Discharge status: Home / Self Care | Payer: Medicare HMO | Source: Ambulatory Visit | Attending: Family Medicine | Admitting: Family Medicine

## 2018-03-21 ENCOUNTER — Other Ambulatory Visit (HOSPITAL_COMMUNITY): Payer: Self-pay | Admitting: Family Medicine

## 2018-03-21 DIAGNOSIS — I509 Heart failure, unspecified: Secondary | ICD-10-CM | POA: Diagnosis not present

## 2018-03-21 DIAGNOSIS — R600 Localized edema: Secondary | ICD-10-CM | POA: Insufficient documentation

## 2018-03-21 DIAGNOSIS — Z7901 Long term (current) use of anticoagulants: Secondary | ICD-10-CM | POA: Diagnosis not present

## 2018-03-21 DIAGNOSIS — M79606 Pain in leg, unspecified: Secondary | ICD-10-CM | POA: Diagnosis not present

## 2018-03-21 DIAGNOSIS — J449 Chronic obstructive pulmonary disease, unspecified: Secondary | ICD-10-CM | POA: Diagnosis not present

## 2018-03-21 DIAGNOSIS — R791 Abnormal coagulation profile: Secondary | ICD-10-CM | POA: Diagnosis not present

## 2018-03-21 DIAGNOSIS — M7989 Other specified soft tissue disorders: Secondary | ICD-10-CM | POA: Diagnosis not present

## 2018-03-21 DIAGNOSIS — I110 Hypertensive heart disease with heart failure: Secondary | ICD-10-CM | POA: Diagnosis not present

## 2018-03-21 DIAGNOSIS — E039 Hypothyroidism, unspecified: Secondary | ICD-10-CM | POA: Diagnosis not present

## 2018-03-21 DIAGNOSIS — Z86711 Personal history of pulmonary embolism: Secondary | ICD-10-CM | POA: Diagnosis not present

## 2018-03-21 DIAGNOSIS — R609 Edema, unspecified: Secondary | ICD-10-CM | POA: Diagnosis not present

## 2018-03-21 DIAGNOSIS — M79661 Pain in right lower leg: Secondary | ICD-10-CM | POA: Diagnosis not present

## 2018-03-21 DIAGNOSIS — T148XXA Other injury of unspecified body region, initial encounter: Secondary | ICD-10-CM | POA: Diagnosis not present

## 2018-03-25 DIAGNOSIS — I201 Angina pectoris with documented spasm: Secondary | ICD-10-CM | POA: Diagnosis not present

## 2018-03-25 DIAGNOSIS — Z6836 Body mass index (BMI) 36.0-36.9, adult: Secondary | ICD-10-CM | POA: Diagnosis not present

## 2018-03-27 ENCOUNTER — Other Ambulatory Visit: Payer: Self-pay | Admitting: Physician Assistant

## 2018-03-27 NOTE — Telephone Encounter (Signed)
Last Visit: 01/29/18 Next Visit: 08/04/18 UDS: 01/07/18 Narc Agreement: 01/13/18  Okay to refill Tramadol?

## 2018-03-28 DIAGNOSIS — I201 Angina pectoris with documented spasm: Secondary | ICD-10-CM | POA: Diagnosis not present

## 2018-03-28 DIAGNOSIS — Z6836 Body mass index (BMI) 36.0-36.9, adult: Secondary | ICD-10-CM | POA: Diagnosis not present

## 2018-04-10 DIAGNOSIS — Z7901 Long term (current) use of anticoagulants: Secondary | ICD-10-CM | POA: Diagnosis not present

## 2018-04-10 DIAGNOSIS — Z6836 Body mass index (BMI) 36.0-36.9, adult: Secondary | ICD-10-CM | POA: Diagnosis not present

## 2018-04-10 DIAGNOSIS — I201 Angina pectoris with documented spasm: Secondary | ICD-10-CM | POA: Diagnosis not present

## 2018-04-15 DIAGNOSIS — I10 Essential (primary) hypertension: Secondary | ICD-10-CM | POA: Diagnosis not present

## 2018-04-15 DIAGNOSIS — J4541 Moderate persistent asthma with (acute) exacerbation: Secondary | ICD-10-CM | POA: Diagnosis not present

## 2018-04-15 DIAGNOSIS — M797 Fibromyalgia: Secondary | ICD-10-CM | POA: Diagnosis not present

## 2018-04-15 DIAGNOSIS — I5032 Chronic diastolic (congestive) heart failure: Secondary | ICD-10-CM | POA: Diagnosis not present

## 2018-04-21 DIAGNOSIS — M546 Pain in thoracic spine: Secondary | ICD-10-CM | POA: Diagnosis not present

## 2018-04-21 DIAGNOSIS — M9902 Segmental and somatic dysfunction of thoracic region: Secondary | ICD-10-CM | POA: Diagnosis not present

## 2018-04-21 DIAGNOSIS — M9903 Segmental and somatic dysfunction of lumbar region: Secondary | ICD-10-CM | POA: Diagnosis not present

## 2018-04-21 DIAGNOSIS — M545 Low back pain: Secondary | ICD-10-CM | POA: Diagnosis not present

## 2018-04-21 DIAGNOSIS — M542 Cervicalgia: Secondary | ICD-10-CM | POA: Diagnosis not present

## 2018-04-21 DIAGNOSIS — M9901 Segmental and somatic dysfunction of cervical region: Secondary | ICD-10-CM | POA: Diagnosis not present

## 2018-04-21 DIAGNOSIS — I10 Essential (primary) hypertension: Secondary | ICD-10-CM | POA: Diagnosis not present

## 2018-04-23 DIAGNOSIS — M25512 Pain in left shoulder: Secondary | ICD-10-CM | POA: Diagnosis not present

## 2018-04-23 DIAGNOSIS — IMO0002 Reserved for concepts with insufficient information to code with codable children: Secondary | ICD-10-CM | POA: Insufficient documentation

## 2018-04-23 DIAGNOSIS — G8929 Other chronic pain: Secondary | ICD-10-CM | POA: Diagnosis not present

## 2018-04-23 DIAGNOSIS — M719 Bursopathy, unspecified: Secondary | ICD-10-CM | POA: Diagnosis not present

## 2018-04-23 DIAGNOSIS — M67919 Unspecified disorder of synovium and tendon, unspecified shoulder: Secondary | ICD-10-CM | POA: Diagnosis not present

## 2018-04-25 DIAGNOSIS — Z7901 Long term (current) use of anticoagulants: Secondary | ICD-10-CM | POA: Diagnosis not present

## 2018-05-08 DIAGNOSIS — J449 Chronic obstructive pulmonary disease, unspecified: Secondary | ICD-10-CM | POA: Diagnosis not present

## 2018-05-08 DIAGNOSIS — Z86711 Personal history of pulmonary embolism: Secondary | ICD-10-CM | POA: Diagnosis not present

## 2018-05-08 DIAGNOSIS — E039 Hypothyroidism, unspecified: Secondary | ICD-10-CM | POA: Diagnosis not present

## 2018-05-08 DIAGNOSIS — N390 Urinary tract infection, site not specified: Secondary | ICD-10-CM | POA: Diagnosis not present

## 2018-05-08 DIAGNOSIS — M5136 Other intervertebral disc degeneration, lumbar region: Secondary | ICD-10-CM | POA: Diagnosis not present

## 2018-05-08 DIAGNOSIS — Z79899 Other long term (current) drug therapy: Secondary | ICD-10-CM | POA: Diagnosis not present

## 2018-05-08 DIAGNOSIS — N3000 Acute cystitis without hematuria: Secondary | ICD-10-CM | POA: Diagnosis not present

## 2018-05-08 DIAGNOSIS — M797 Fibromyalgia: Secondary | ICD-10-CM | POA: Diagnosis not present

## 2018-05-08 DIAGNOSIS — G8929 Other chronic pain: Secondary | ICD-10-CM | POA: Diagnosis not present

## 2018-05-08 DIAGNOSIS — M5489 Other dorsalgia: Secondary | ICD-10-CM | POA: Diagnosis not present

## 2018-05-08 DIAGNOSIS — M5441 Lumbago with sciatica, right side: Secondary | ICD-10-CM | POA: Diagnosis not present

## 2018-05-08 DIAGNOSIS — R52 Pain, unspecified: Secondary | ICD-10-CM | POA: Diagnosis not present

## 2018-05-08 DIAGNOSIS — M545 Low back pain: Secondary | ICD-10-CM | POA: Diagnosis not present

## 2018-05-14 DIAGNOSIS — N3020 Other chronic cystitis without hematuria: Secondary | ICD-10-CM | POA: Diagnosis not present

## 2018-05-14 DIAGNOSIS — Z6837 Body mass index (BMI) 37.0-37.9, adult: Secondary | ICD-10-CM | POA: Diagnosis not present

## 2018-05-14 DIAGNOSIS — M546 Pain in thoracic spine: Secondary | ICD-10-CM | POA: Diagnosis not present

## 2018-05-14 DIAGNOSIS — M545 Low back pain: Secondary | ICD-10-CM | POA: Diagnosis not present

## 2018-05-14 DIAGNOSIS — M9902 Segmental and somatic dysfunction of thoracic region: Secondary | ICD-10-CM | POA: Diagnosis not present

## 2018-05-14 DIAGNOSIS — M9903 Segmental and somatic dysfunction of lumbar region: Secondary | ICD-10-CM | POA: Diagnosis not present

## 2018-05-14 DIAGNOSIS — M542 Cervicalgia: Secondary | ICD-10-CM | POA: Diagnosis not present

## 2018-05-14 DIAGNOSIS — M9901 Segmental and somatic dysfunction of cervical region: Secondary | ICD-10-CM | POA: Diagnosis not present

## 2018-05-16 DIAGNOSIS — Z6837 Body mass index (BMI) 37.0-37.9, adult: Secondary | ICD-10-CM | POA: Diagnosis not present

## 2018-05-16 DIAGNOSIS — M545 Low back pain: Secondary | ICD-10-CM | POA: Diagnosis not present

## 2018-05-16 DIAGNOSIS — Z7901 Long term (current) use of anticoagulants: Secondary | ICD-10-CM | POA: Diagnosis not present

## 2018-05-16 DIAGNOSIS — N3020 Other chronic cystitis without hematuria: Secondary | ICD-10-CM | POA: Diagnosis not present

## 2018-05-22 DIAGNOSIS — M25512 Pain in left shoulder: Secondary | ICD-10-CM | POA: Diagnosis not present

## 2018-05-22 DIAGNOSIS — M719 Bursopathy, unspecified: Secondary | ICD-10-CM | POA: Diagnosis not present

## 2018-05-25 ENCOUNTER — Other Ambulatory Visit: Payer: Self-pay | Admitting: Physician Assistant

## 2018-05-26 DIAGNOSIS — M545 Low back pain: Secondary | ICD-10-CM | POA: Diagnosis not present

## 2018-05-26 DIAGNOSIS — J4541 Moderate persistent asthma with (acute) exacerbation: Secondary | ICD-10-CM | POA: Diagnosis not present

## 2018-05-26 DIAGNOSIS — I5032 Chronic diastolic (congestive) heart failure: Secondary | ICD-10-CM | POA: Diagnosis not present

## 2018-05-26 DIAGNOSIS — I10 Essential (primary) hypertension: Secondary | ICD-10-CM | POA: Diagnosis not present

## 2018-05-26 NOTE — Telephone Encounter (Signed)
Last Visit: 01/29/18 Next Visit: 08/04/18 UDS: 01/07/18 Narc Agreement: 01/13/18  Okay to refill Tramadol?

## 2018-05-28 DIAGNOSIS — J018 Other acute sinusitis: Secondary | ICD-10-CM | POA: Diagnosis not present

## 2018-05-28 DIAGNOSIS — M545 Low back pain: Secondary | ICD-10-CM | POA: Diagnosis not present

## 2018-05-28 DIAGNOSIS — J4520 Mild intermittent asthma, uncomplicated: Secondary | ICD-10-CM | POA: Diagnosis not present

## 2018-05-28 DIAGNOSIS — Z7901 Long term (current) use of anticoagulants: Secondary | ICD-10-CM | POA: Diagnosis not present

## 2018-05-28 DIAGNOSIS — N3020 Other chronic cystitis without hematuria: Secondary | ICD-10-CM | POA: Diagnosis not present

## 2018-05-28 DIAGNOSIS — Z1389 Encounter for screening for other disorder: Secondary | ICD-10-CM | POA: Diagnosis not present

## 2018-05-28 DIAGNOSIS — J0191 Acute recurrent sinusitis, unspecified: Secondary | ICD-10-CM | POA: Diagnosis not present

## 2018-05-28 DIAGNOSIS — Z6837 Body mass index (BMI) 37.0-37.9, adult: Secondary | ICD-10-CM | POA: Diagnosis not present

## 2018-05-28 DIAGNOSIS — Z0000 Encounter for general adult medical examination without abnormal findings: Secondary | ICD-10-CM | POA: Diagnosis not present

## 2018-05-28 DIAGNOSIS — I10 Essential (primary) hypertension: Secondary | ICD-10-CM | POA: Diagnosis not present

## 2018-05-28 DIAGNOSIS — Z6836 Body mass index (BMI) 36.0-36.9, adult: Secondary | ICD-10-CM | POA: Diagnosis not present

## 2018-06-02 DIAGNOSIS — M19012 Primary osteoarthritis, left shoulder: Secondary | ICD-10-CM | POA: Diagnosis not present

## 2018-06-02 DIAGNOSIS — M67919 Unspecified disorder of synovium and tendon, unspecified shoulder: Secondary | ICD-10-CM | POA: Diagnosis not present

## 2018-06-02 DIAGNOSIS — M7552 Bursitis of left shoulder: Secondary | ICD-10-CM | POA: Diagnosis not present

## 2018-06-02 DIAGNOSIS — M25512 Pain in left shoulder: Secondary | ICD-10-CM | POA: Diagnosis not present

## 2018-06-05 DIAGNOSIS — M719 Bursopathy, unspecified: Secondary | ICD-10-CM | POA: Diagnosis not present

## 2018-06-05 DIAGNOSIS — M67919 Unspecified disorder of synovium and tendon, unspecified shoulder: Secondary | ICD-10-CM | POA: Diagnosis not present

## 2018-06-16 DIAGNOSIS — M546 Pain in thoracic spine: Secondary | ICD-10-CM | POA: Diagnosis not present

## 2018-06-16 DIAGNOSIS — M545 Low back pain: Secondary | ICD-10-CM | POA: Diagnosis not present

## 2018-06-16 DIAGNOSIS — M9902 Segmental and somatic dysfunction of thoracic region: Secondary | ICD-10-CM | POA: Diagnosis not present

## 2018-06-16 DIAGNOSIS — M542 Cervicalgia: Secondary | ICD-10-CM | POA: Diagnosis not present

## 2018-06-16 DIAGNOSIS — M9901 Segmental and somatic dysfunction of cervical region: Secondary | ICD-10-CM | POA: Diagnosis not present

## 2018-06-16 DIAGNOSIS — I10 Essential (primary) hypertension: Secondary | ICD-10-CM | POA: Diagnosis not present

## 2018-06-16 DIAGNOSIS — M9903 Segmental and somatic dysfunction of lumbar region: Secondary | ICD-10-CM | POA: Diagnosis not present

## 2018-06-20 DIAGNOSIS — Z7901 Long term (current) use of anticoagulants: Secondary | ICD-10-CM | POA: Diagnosis not present

## 2018-06-21 ENCOUNTER — Other Ambulatory Visit: Payer: Self-pay | Admitting: Rheumatology

## 2018-06-23 ENCOUNTER — Telehealth: Payer: Self-pay | Admitting: Rheumatology

## 2018-06-23 DIAGNOSIS — Z79899 Other long term (current) drug therapy: Secondary | ICD-10-CM

## 2018-06-23 DIAGNOSIS — M1120 Other chondrocalcinosis, unspecified site: Secondary | ICD-10-CM

## 2018-06-23 NOTE — Telephone Encounter (Addendum)
Last Visit: 01/29/18 Next Visit: 08/04/18 Labs: 12/31/17 CBC and CMP WNL.  Left message to advise patient she is due to update labs.  Okay to refill 30 day supply per Dr. Estanislado Pandy

## 2018-06-23 NOTE — Telephone Encounter (Signed)
Patient called requesting her labwork orders be sent to [facility] in [place] across the street from [facility].

## 2018-06-23 NOTE — Telephone Encounter (Signed)
Lab orders released.  

## 2018-06-24 DIAGNOSIS — M25512 Pain in left shoulder: Secondary | ICD-10-CM | POA: Diagnosis not present

## 2018-06-24 DIAGNOSIS — M67919 Unspecified disorder of synovium and tendon, unspecified shoulder: Secondary | ICD-10-CM | POA: Diagnosis not present

## 2018-06-24 DIAGNOSIS — M719 Bursopathy, unspecified: Secondary | ICD-10-CM | POA: Diagnosis not present

## 2018-06-26 DIAGNOSIS — Z79899 Other long term (current) drug therapy: Secondary | ICD-10-CM | POA: Diagnosis not present

## 2018-06-27 LAB — COMPLETE METABOLIC PANEL WITH GFR
AG Ratio: 1.6 (calc) (ref 1.0–2.5)
ALBUMIN MSPROF: 4.1 g/dL (ref 3.6–5.1)
ALKALINE PHOSPHATASE (APISO): 96 U/L (ref 33–130)
ALT: 18 U/L (ref 6–29)
AST: 18 U/L (ref 10–35)
BILIRUBIN TOTAL: 0.9 mg/dL (ref 0.2–1.2)
BUN: 10 mg/dL (ref 7–25)
CHLORIDE: 103 mmol/L (ref 98–110)
CO2: 28 mmol/L (ref 20–32)
CREATININE: 0.95 mg/dL (ref 0.50–0.99)
Calcium: 9.2 mg/dL (ref 8.6–10.4)
GFR, EST AFRICAN AMERICAN: 73 mL/min/{1.73_m2} (ref >=60)
GFR, Est Non African American: 63 mL/min/{1.73_m2} (ref >=60)
Globulin: 2.6 g/dL (calc) (ref 1.9–3.7)
Glucose, Bld: 92 mg/dL (ref 65–139)
Potassium: 3.8 mmol/L (ref 3.5–5.3)
Sodium: 141 mmol/L (ref 135–146)
TOTAL PROTEIN: 6.7 g/dL (ref 6.1–8.1)

## 2018-06-27 LAB — CBC WITH DIFFERENTIAL/PLATELET
BASOS PCT: 0.7 %
Basophils Absolute: 49 cells/uL (ref 0–200)
EOS ABS: 84 {cells}/uL (ref 15–500)
Eosinophils Relative: 1.2 %
HCT: 36.3 % (ref 35.0–45.0)
Hemoglobin: 12 g/dL (ref 11.7–15.5)
Lymphs Abs: 2415 cells/uL (ref 850–3900)
MCH: 29.3 pg (ref 27.0–33.0)
MCHC: 33.1 g/dL (ref 32.0–36.0)
MCV: 88.8 fL (ref 80.0–100.0)
MONOS PCT: 7.3 %
MPV: 10.4 fL (ref 7.5–12.5)
Neutro Abs: 3941 cells/uL (ref 1500–7800)
Neutrophils Relative %: 56.3 %
PLATELETS: 317 10*3/uL (ref 140–400)
RBC: 4.09 10*6/uL (ref 3.80–5.10)
RDW: 12.8 % (ref 11.0–15.0)
TOTAL LYMPHOCYTE: 34.5 %
WBC mixed population: 511 cells/uL (ref 200–950)
WBC: 7 10*3/uL (ref 3.8–10.8)

## 2018-06-27 LAB — URIC ACID: URIC ACID, SERUM: 5.5 mg/dL (ref 2.5–7.0)

## 2018-06-27 NOTE — Telephone Encounter (Signed)
wnls

## 2018-06-30 DIAGNOSIS — M545 Low back pain: Secondary | ICD-10-CM | POA: Diagnosis not present

## 2018-06-30 DIAGNOSIS — I10 Essential (primary) hypertension: Secondary | ICD-10-CM | POA: Diagnosis not present

## 2018-07-02 DIAGNOSIS — M719 Bursopathy, unspecified: Secondary | ICD-10-CM | POA: Diagnosis not present

## 2018-07-02 DIAGNOSIS — M25512 Pain in left shoulder: Secondary | ICD-10-CM | POA: Diagnosis not present

## 2018-07-02 DIAGNOSIS — M67919 Unspecified disorder of synovium and tendon, unspecified shoulder: Secondary | ICD-10-CM | POA: Diagnosis not present

## 2018-07-04 DIAGNOSIS — M719 Bursopathy, unspecified: Secondary | ICD-10-CM | POA: Diagnosis not present

## 2018-07-04 DIAGNOSIS — Z7901 Long term (current) use of anticoagulants: Secondary | ICD-10-CM | POA: Diagnosis not present

## 2018-07-04 DIAGNOSIS — M25512 Pain in left shoulder: Secondary | ICD-10-CM | POA: Diagnosis not present

## 2018-07-04 DIAGNOSIS — M67919 Unspecified disorder of synovium and tendon, unspecified shoulder: Secondary | ICD-10-CM | POA: Diagnosis not present

## 2018-07-09 DIAGNOSIS — M67919 Unspecified disorder of synovium and tendon, unspecified shoulder: Secondary | ICD-10-CM | POA: Diagnosis not present

## 2018-07-09 DIAGNOSIS — M25512 Pain in left shoulder: Secondary | ICD-10-CM | POA: Diagnosis not present

## 2018-07-09 DIAGNOSIS — M719 Bursopathy, unspecified: Secondary | ICD-10-CM | POA: Diagnosis not present

## 2018-07-11 DIAGNOSIS — M25512 Pain in left shoulder: Secondary | ICD-10-CM | POA: Diagnosis not present

## 2018-07-11 DIAGNOSIS — M67919 Unspecified disorder of synovium and tendon, unspecified shoulder: Secondary | ICD-10-CM | POA: Diagnosis not present

## 2018-07-11 DIAGNOSIS — M719 Bursopathy, unspecified: Secondary | ICD-10-CM | POA: Diagnosis not present

## 2018-07-16 DIAGNOSIS — M67919 Unspecified disorder of synovium and tendon, unspecified shoulder: Secondary | ICD-10-CM | POA: Diagnosis not present

## 2018-07-16 DIAGNOSIS — M25512 Pain in left shoulder: Secondary | ICD-10-CM | POA: Diagnosis not present

## 2018-07-16 DIAGNOSIS — M719 Bursopathy, unspecified: Secondary | ICD-10-CM | POA: Diagnosis not present

## 2018-07-17 DIAGNOSIS — M25512 Pain in left shoulder: Secondary | ICD-10-CM | POA: Diagnosis not present

## 2018-07-17 DIAGNOSIS — M792 Neuralgia and neuritis, unspecified: Secondary | ICD-10-CM | POA: Diagnosis not present

## 2018-07-17 DIAGNOSIS — M1990 Unspecified osteoarthritis, unspecified site: Secondary | ICD-10-CM | POA: Diagnosis not present

## 2018-07-17 DIAGNOSIS — G8929 Other chronic pain: Secondary | ICD-10-CM | POA: Diagnosis not present

## 2018-07-21 DIAGNOSIS — E119 Type 2 diabetes mellitus without complications: Secondary | ICD-10-CM | POA: Diagnosis not present

## 2018-07-21 DIAGNOSIS — H5200 Hypermetropia, unspecified eye: Secondary | ICD-10-CM | POA: Diagnosis not present

## 2018-07-21 DIAGNOSIS — Z0100 Encounter for examination of eyes and vision without abnormal findings: Secondary | ICD-10-CM | POA: Diagnosis not present

## 2018-07-21 NOTE — Progress Notes (Deleted)
Office Visit Note  Patient: Dorothy Ford             Date of Birth: 1950/10/02           MRN: [id number]             PCP: Neale Burly, MD Referring: Neale Burly, MD Visit Date: 08/04/2018 Occupation: @GUAROCC @  Subjective:  No chief complaint on file.   History of Present Illness: Dorothy Ford is a 67 y.o. female ***   Activities of Daily Living:  Patient reports morning stiffness for *** {minute/hour:19697}.   Patient {ACTIONS;DENIES/REPORTS:21021675::"Denies"} nocturnal pain.  Difficulty dressing/grooming: {ACTIONS;DENIES/REPORTS:21021675::"Denies"} Difficulty climbing stairs: {ACTIONS;DENIES/REPORTS:21021675::"Denies"} Difficulty getting out of chair: {ACTIONS;DENIES/REPORTS:21021675::"Denies"} Difficulty using hands for taps, buttons, cutlery, and/or writing: {ACTIONS;DENIES/REPORTS:21021675::"Denies"}  No Rheumatology ROS completed.   PMFS History:  Patient Active Problem List   Diagnosis Date Noted  . 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 Chondromalacia patellae, right knee 01/15/2017  . Osteoarthritis of both hands 07/24/2016  . DDD (degenerative disc disease), cervical 07/24/2016  . DDD (degenerative disc disease), lumbar 07/24/2016  . Osteoporosis 07/24/2016  . Glaucoma 07/24/2016  . Sleep apnea 07/24/2016  . Pseudogout 07/19/2016  . Primary osteoarthritis of both knees 07/19/2016  . Fibromyalgia 07/19/2016  . 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 Pulmonary embolism ([place]) 01/18/2012  . Anxiety 01/18/2012  . Syncope   . RHEUMATOID LUNG 12/30/2009  . OBESITY, UNSPECIFIED 06/02/2009  . DEPRESSIVE DISORDER NOT ELSEWHERE CLASSIFIED 06/02/2009  . SUPRAVENTRICULAR TACHYCARDIA 06/02/2009  . Chronic diastolic heart failure ([place]) 06/02/2009  . COPD 06/02/2009  . DIAPHRAGMAT HERN W/O MENTION OBSTRUCTION/GANGREN 06/02/2009  . PALPITATIONS 06/02/2009  . CHEST PAIN UNSPECIFIED 06/02/2009    Past Medical History:  Diagnosis Date  . Acid  reflux   . Asthma   . Cardiac dysrhythmia, unspecified   . Carotid bruit    Bilateral  . Cataract   . Chest pain, unspecified   . Chronic airway obstruction, not elsewhere classified   . Chronic sinusitis   . Congestive heart failure, unspecified   . DDD (degenerative disc disease), cervical 07/24/2016  . DDD (degenerative disc disease), lumbar 07/24/2016  . Depressive disorder, not elsewhere classified   . 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 Edema    left leg  . Fibromyalgia   . High cholesterol   . History of rheumatoid arthritis   . Obesity, unspecified   . Obstructive lung disease (generalized) ([place])   . Osteoarthritis of both hands 07/24/2016  . Osteoporosis 07/24/2016  . Palpitations   . Pseudogout involving multiple joints   . SVT (supraventricular tachycardia) ([place])   . Syncope    admitted 09/2011  . Thyroid disease   . Unspecified essential hypertension     Family History  Problem Relation Age of Onset  . Heart disease Mother   . Stroke Unknown   . Heart disease Father    Past Surgical History:  Procedure Laterality Date  . CARPAL TUNNEL RELEASE    . CATHETER ABLATION  02/1997   @ [place]     Social History   Social History Narrative  . Not on file    Objective: Vital Signs: There were no vitals taken for this visit.   Physical Exam   Musculoskeletal Exam: ***  CDAI Exam: CDAI Score: Not documented Patient Global Assessment: Not documented; Provider Global Assessment: Not documented Swollen: Not documented; Tender: Not documented Joint Exam   Not documented  There is currently no information documented on the homunculus. Go to the Rheumatology activity and complete the homunculus joint exam.  Investigation: No additional findings.  Imaging: No results found.  Recent Labs: Lab Results  Component Value Date   WBC 7.0 06/26/2018   HGB 12.0 06/26/2018   PLT 317 06/26/2018   NA  141 06/26/2018   K 3.8 06/26/2018   CL 103 06/26/2018   CO2 28 06/26/2018   GLUCOSE 92 06/26/2018   BUN 10 06/26/2018   CREATININE 0.95 06/26/2018   BILITOT 0.9 06/26/2018   AST 18 06/26/2018   ALT 18 06/26/2018   PROT 6.7 06/26/2018   CALCIUM 9.2 06/26/2018   GFRAA 73 06/26/2018    Speciality Comments: No specialty comments available.  Procedures:  No procedures performed Allergies: Adhesive [tape] and Codeine   Assessment / Plan:     Visit Diagnoses: No diagnosis found.   Orders: No orders of the defined types were placed in this encounter.  No orders of the defined types were placed in this encounter.   Face-to-face time spent with patient was *** minutes. Greater than 50% of time was spent in counseling and coordination of care.  Follow-Up Instructions: No follow-ups on file.   Earnestine Mealing, CMA  Note - This record has been created using Editor, commissioning.  Chart creation errors have been sought, but may not always  have been located. Such creation errors do not reflect on  the standard of medical care.

## 2018-07-22 ENCOUNTER — Other Ambulatory Visit: Payer: Self-pay | Admitting: Physician Assistant

## 2018-07-22 ENCOUNTER — Other Ambulatory Visit: Payer: Self-pay | Admitting: Rheumatology

## 2018-07-23 NOTE — Telephone Encounter (Signed)
Last Visit: 01/29/18 Next Visit: 08/04/18 UDS: 01/07/18 Narc Agreement: 01/13/18  Patient to update UDS and narc agreement at appointment on 08/04/18  Okay to refill Tramadol?

## 2018-07-23 NOTE — Telephone Encounter (Signed)
Last Visit: 01/29/18 Next Visit: 08/04/18 Labs: 06/26/18 WNL  Okay to refill per Dr. Estanislado Pandy

## 2018-07-25 DIAGNOSIS — Z7901 Long term (current) use of anticoagulants: Secondary | ICD-10-CM | POA: Diagnosis not present

## 2018-07-29 DIAGNOSIS — Z23 Encounter for immunization: Secondary | ICD-10-CM | POA: Diagnosis not present

## 2018-07-29 DIAGNOSIS — J4540 Moderate persistent asthma, uncomplicated: Secondary | ICD-10-CM | POA: Diagnosis not present

## 2018-07-29 DIAGNOSIS — J302 Other seasonal allergic rhinitis: Secondary | ICD-10-CM | POA: Diagnosis not present

## 2018-07-29 DIAGNOSIS — J329 Chronic sinusitis, unspecified: Secondary | ICD-10-CM | POA: Diagnosis not present

## 2018-07-29 DIAGNOSIS — Z87891 Personal history of nicotine dependence: Secondary | ICD-10-CM | POA: Diagnosis not present

## 2018-07-29 NOTE — Progress Notes (Signed)
Office Visit Note  Patient: Dorothy Ford             Date of Birth: 18-Jul-1951           MRN: [id number]             PCP: Neale Burly, MD Referring: Neale Burly, MD Visit Date: 08/06/2018 Occupation: @GUAROCC @  Subjective:  Left 3rd finger pain   History of Present Illness: Dorothy Ford is a 67 y.o. female with history of pseudogout, osteoarthritis, DDD, and fibromyalgia.  She is on Colchicine 0.6 mg 1 tablet by mouth daily.  She denies missing any doses.  She denies any pseudogout flares recently.  She fell out of bed 1 week ago and was evaluated in the ED.  She had a x-ray that revealed an avulsion fracture of the left 3rd PIP.  A splint was applied and will stay on for 4 weeks.  She reports she has bruising and swelling of the digit. She has been following up with her PCP Dr. Sherrie Sport. She takes tramadol 50 mg 1 to 2 tablets BID PRN for pain relief.   She continues to have generalized muscle aches and muscle tenderness.  She states she has occasional fibromyalgia flares.  She has trapezius muscle tenderness but her neck pain overall has been improving.   She has intermittent lower back pain and sciatica.  She continues to have left shoulder pain and slightly limited ROM.   Activities of Daily Living:  Patient reports morning stiffness for 30  minutes.   Patient Reports nocturnal pain.  Difficulty dressing/grooming: Denies Difficulty climbing stairs: Reports Difficulty getting out of chair: Reports Difficulty using hands for taps, buttons, cutlery, and/or writing: Reports  Review of Systems  Constitutional: Positive for fatigue.  HENT: Positive for mouth dryness (Biotene products). Negative for mouth sores and nose dryness.   Eyes: Positive for dryness (Use Restasis). Negative for pain and visual disturbance.  Respiratory: Negative for cough, hemoptysis, shortness of breath and difficulty breathing.   Cardiovascular: Negative for chest pain, palpitations,  hypertension and swelling in legs/feet.  Gastrointestinal: Negative for blood in stool, constipation and diarrhea.  Endocrine: Negative for increased urination.  Genitourinary: Negative for painful urination.  Musculoskeletal: Positive for arthralgias, joint pain, myalgias, morning stiffness, muscle tenderness and myalgias. Negative for joint swelling and muscle weakness.  Skin: Negative for color change, pallor, rash, hair loss, nodules/bumps, skin tightness, ulcers and sensitivity to sunlight.  Allergic/Immunologic: Negative for susceptible to infections.  Neurological: Negative for dizziness, numbness, headaches and weakness.  Hematological: Negative for swollen glands.  Psychiatric/Behavioral: Positive for sleep disturbance. Negative for depressed mood. The patient is nervous/anxious.     PMFS History:  Patient Active Problem List   Diagnosis Date Noted  . 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 Chondromalacia patellae, right knee 01/15/2017  . Osteoarthritis of both hands 07/24/2016  . DDD (degenerative disc disease), cervical 07/24/2016  . DDD (degenerative disc disease), lumbar 07/24/2016  . Osteoporosis 07/24/2016  . Glaucoma 07/24/2016  . Sleep apnea 07/24/2016  . Pseudogout 07/19/2016  . Primary osteoarthritis of both knees 07/19/2016  . Fibromyalgia 07/19/2016  . 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 Pulmonary embolism ([place]) 01/18/2012  . Anxiety 01/18/2012  . Syncope   . RHEUMATOID LUNG 12/30/2009  . OBESITY, UNSPECIFIED 06/02/2009  . DEPRESSIVE DISORDER NOT ELSEWHERE CLASSIFIED 06/02/2009  . SUPRAVENTRICULAR TACHYCARDIA 06/02/2009  . Chronic diastolic heart failure ([place]) 06/02/2009  . COPD 06/02/2009  . DIAPHRAGMAT HERN W/O MENTION OBSTRUCTION/GANGREN 06/02/2009  .  PALPITATIONS 06/02/2009  . CHEST PAIN UNSPECIFIED 06/02/2009    Past Medical History:  Diagnosis Date  . Acid reflux   . Asthma   . Cardiac dysrhythmia, unspecified   . Carotid bruit     Bilateral  . Cataract   . Chest pain, unspecified   . Chronic airway obstruction, not elsewhere classified   . Chronic sinusitis   . Congestive heart failure, unspecified   . DDD (degenerative disc disease), cervical 07/24/2016  . DDD (degenerative disc disease), lumbar 07/24/2016  . Depressive disorder, not elsewhere classified   . 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 Edema    left leg  . Fibromyalgia   . High cholesterol   . History of rheumatoid arthritis   . Obesity, unspecified   . Obstructive lung disease (generalized) ([place])   . Osteoarthritis of both hands 07/24/2016  . Osteoporosis 07/24/2016  . Palpitations   . Pseudogout involving multiple joints   . SVT (supraventricular tachycardia) ([place])   . Syncope    admitted 09/2011  . Thyroid disease   . Unspecified essential hypertension     Family History  Problem Relation Age of Onset  . Heart disease Mother   . Stroke Unknown   . Heart disease Father    Past Surgical History:  Procedure Laterality Date  . CARPAL TUNNEL RELEASE    . CATHETER ABLATION  02/1997   @ [place]     Social History   Social History Narrative  . Not on file    Objective: Vital Signs: BP 114/78 (BP Location: Right Arm, Patient Position: Sitting, Cuff Size: Normal)   Pulse 73   Resp 14   Ht 5\' 1"  (1.549 m)   Wt 185 lb (83.9 kg)   BMI 34.96 kg/m    Physical Exam  Constitutional: She is oriented to person, place, and time. She appears well-developed and well-nourished.  HENT:  Head: Normocephalic and atraumatic.  Eyes: Conjunctivae and EOM are normal.  Neck: Normal range of motion.  Cardiovascular: Normal rate, regular rhythm, normal heart sounds and intact distal pulses.  Pulmonary/Chest: Effort normal and breath sounds normal.  Abdominal: Soft. Bowel sounds are normal.  Lymphadenopathy:    She has no cervical adenopathy.  Neurological: She is alert and oriented to person,  place, and time.  Skin: Skin is warm and dry. Capillary refill takes less than 2 seconds.  Psychiatric: She has a normal mood and affect. Her behavior is normal.  Nursing note and vitals reviewed.    Musculoskeletal Exam: C-spine limited ROM. Thoracic kyphosis.  Limited ROM of lumbar spine.  No midline spinal tenderness.  No SI joint tenderness.  Right shoulder full ROM.  Left shoulder limited abduction to 120 degrees.  Elbow joints good ROM with no tenderness or synovitis.  Wrist joints good ROM with no synovitis. Left 3rd digit in splint for avulsion fracture. Hip joints, knee joints, ankle joints, MTPs, PIPs and DIPs good ROM with no synovitis. No warmth or effusion of knee joints.  No knee crepitus.  No tenderness of trochanteric bursa on exam.   CDAI Exam: CDAI Score: Not documented Patient Global Assessment: Not documented; Provider Global Assessment: Not documented Swollen: Not documented; Tender: Not documented Joint Exam   Not documented   There is currently no information documented on the homunculus. Go to the Rheumatology activity and complete the homunculus joint exam.  Investigation: No additional findings.  Imaging: No results found.  Recent Labs:  Lab Results  Component Value Date   WBC 7.0 06/26/2018   HGB 12.0 06/26/2018   PLT 317 06/26/2018   NA 141 06/26/2018   K 3.8 06/26/2018   CL 103 06/26/2018   CO2 28 06/26/2018   GLUCOSE 92 06/26/2018   BUN 10 06/26/2018   CREATININE 0.95 06/26/2018   BILITOT 0.9 06/26/2018   AST 18 06/26/2018   ALT 18 06/26/2018   PROT 6.7 06/26/2018   CALCIUM 9.2 06/26/2018   GFRAA 73 06/26/2018    Speciality Comments: No specialty comments available.  Procedures:  No procedures performed Allergies: Adhesive [tape] and Codeine   Assessment / Plan:     Visit Diagnoses: Pseudogout -she has not had any recent pseudogout flares.  She continues to take Colcrys 0.6 mg 1 tablet by mouth daily.  She does not need any refills of  Colcrys at this time.  CBC and CMP were drawn on 06/26/2018 and were within normal limits.  She was advised to notify us if she develops a pseudogout flare.  She will follow-up in the office in 6 months.  Primary osteoarthritis of both hands: She has PIP and DIP synovial thickening consistent with osteoarthritis of bilateral hands.  She has complete fist formation bilaterally.  No synovitis noted.  Joint protection and muscle strengthening were discussed.  1 week ago she fell out of bed and sustained a left PIP avulsion fracture.  She currently is wearing a splint and will be wearing it for the next 4 weeks.  She has been following up with her PCP.  Primary osteoarthritis of both knees: No warmth or effusion.  Good range of motion bilaterally.   DDD (degenerative disc disease), cervical: Limited ROM of the C-spine. She has no symptoms of radiculopathy.  She has trapezius muscle tension and muscle tenderness.  Her neck pain and stiffness has been improving recently.  DDD (degenerative disc disease), lumbar: She has intermittent lower back pain.  She has limited ROM.  No midline spinal tenderness. She has no symptoms of sciatica or radiculopathy at this time.  Fibromyalgia -she continues have generalized muscle aches and muscle tenderness due to fibromyalgia.  She has occasional fibromyalgia flares.  She has chronic fatigue.  She experiences some discomfort at night which leads to interrupted sleep.  She takes tramadol 50 mg 1 to 2 tablets twice daily PRN for pain relief.  She does not need any refills at this time.  We will update UDS and her regular meds today on 08/06/2018.   Avulsion fracture: She fell out of bed 1 week ago and was evaluated in the emergency department.  An x-ray revealed an avulsion fracture of the left third PIP.  She has been wearing a splint and will continue for the next 4 weeks.  She is been following up with her PCP.  She continues to have swelling and bruising.  Other medical  conditions are listed as follows:  Osteopenia of multiple sites  History of sleep apnea  History of COPD  History of CHF (congestive heart failure)  History of glaucoma  History of pulmonary embolism  Medication monitoring encounter -UDS and narcotic agreement were updated today on 08/06/2018.  Plan: Pain Mgmt, Profile 5 w/Conf, U, Pain Mgmt, Tramadol w/medMATCH, U   Orders: Orders Placed This Encounter  Procedures  . Pain Mgmt, Profile 5 w/Conf, U  . Pain Mgmt, Tramadol w/medMATCH, U   No orders of the defined types were placed in this encounter.     Follow-Up Instructions:  Return in about 6 months (around 02/05/2019) for Pseudogout, Osteoarthritis, DDD, Fibromyalgia.   Ofilia Neas, PA-C  Note - This record has been created using Dragon software.  Chart creation errors have been sought, but may not always  have been located. Such creation errors do not reflect on  the standard of medical care.

## 2018-07-30 DIAGNOSIS — I110 Hypertensive heart disease with heart failure: Secondary | ICD-10-CM | POA: Diagnosis not present

## 2018-07-30 DIAGNOSIS — M19042 Primary osteoarthritis, left hand: Secondary | ICD-10-CM | POA: Diagnosis not present

## 2018-07-30 DIAGNOSIS — R000 Tachycardia, unspecified: Secondary | ICD-10-CM | POA: Diagnosis not present

## 2018-07-30 DIAGNOSIS — E876 Hypokalemia: Secondary | ICD-10-CM | POA: Diagnosis not present

## 2018-07-30 DIAGNOSIS — S62631A Displaced fracture of distal phalanx of left index finger, initial encounter for closed fracture: Secondary | ICD-10-CM | POA: Diagnosis not present

## 2018-07-30 DIAGNOSIS — M25532 Pain in left wrist: Secondary | ICD-10-CM | POA: Diagnosis not present

## 2018-07-30 DIAGNOSIS — J449 Chronic obstructive pulmonary disease, unspecified: Secondary | ICD-10-CM | POA: Diagnosis not present

## 2018-07-30 DIAGNOSIS — I4891 Unspecified atrial fibrillation: Secondary | ICD-10-CM | POA: Diagnosis not present

## 2018-07-30 DIAGNOSIS — W06XXXA Fall from bed, initial encounter: Secondary | ICD-10-CM | POA: Diagnosis not present

## 2018-07-30 DIAGNOSIS — S6991XA Unspecified injury of right wrist, hand and finger(s), initial encounter: Secondary | ICD-10-CM | POA: Diagnosis not present

## 2018-07-30 DIAGNOSIS — I509 Heart failure, unspecified: Secondary | ICD-10-CM | POA: Diagnosis not present

## 2018-07-30 DIAGNOSIS — S62643A Nondisplaced fracture of proximal phalanx of left middle finger, initial encounter for closed fracture: Secondary | ICD-10-CM | POA: Diagnosis not present

## 2018-08-04 ENCOUNTER — Ambulatory Visit: Payer: Medicare HMO | Admitting: Rheumatology

## 2018-08-04 DIAGNOSIS — M545 Low back pain: Secondary | ICD-10-CM | POA: Diagnosis not present

## 2018-08-04 DIAGNOSIS — I10 Essential (primary) hypertension: Secondary | ICD-10-CM | POA: Diagnosis not present

## 2018-08-05 DIAGNOSIS — S62633A Displaced fracture of distal phalanx of left middle finger, initial encounter for closed fracture: Secondary | ICD-10-CM | POA: Diagnosis not present

## 2018-08-05 DIAGNOSIS — Z6836 Body mass index (BMI) 36.0-36.9, adult: Secondary | ICD-10-CM | POA: Diagnosis not present

## 2018-08-06 ENCOUNTER — Ambulatory Visit (INDEPENDENT_AMBULATORY_CARE_PROVIDER_SITE_OTHER): Payer: Medicare HMO | Admitting: Physician Assistant

## 2018-08-06 ENCOUNTER — Encounter: Payer: Self-pay | Admitting: Physician Assistant

## 2018-08-06 VITALS — BP 114/78 | HR 73 | Resp 14 | Ht 61.0 in | Wt 185.0 lb

## 2018-08-06 DIAGNOSIS — E039 Hypothyroidism, unspecified: Secondary | ICD-10-CM | POA: Diagnosis not present

## 2018-08-06 DIAGNOSIS — M19041 Primary osteoarthritis, right hand: Secondary | ICD-10-CM

## 2018-08-06 DIAGNOSIS — Z8669 Personal history of other diseases of the nervous system and sense organs: Secondary | ICD-10-CM | POA: Diagnosis not present

## 2018-08-06 DIAGNOSIS — R791 Abnormal coagulation profile: Secondary | ICD-10-CM | POA: Diagnosis not present

## 2018-08-06 DIAGNOSIS — Z86711 Personal history of pulmonary embolism: Secondary | ICD-10-CM

## 2018-08-06 DIAGNOSIS — Z8709 Personal history of other diseases of the respiratory system: Secondary | ICD-10-CM | POA: Diagnosis not present

## 2018-08-06 DIAGNOSIS — M79605 Pain in left leg: Secondary | ICD-10-CM | POA: Diagnosis not present

## 2018-08-06 DIAGNOSIS — M5136 Other intervertebral disc degeneration, lumbar region: Secondary | ICD-10-CM

## 2018-08-06 DIAGNOSIS — M170 Bilateral primary osteoarthritis of knee: Secondary | ICD-10-CM

## 2018-08-06 DIAGNOSIS — M5030 Other cervical disc degeneration, unspecified cervical region: Secondary | ICD-10-CM | POA: Diagnosis not present

## 2018-08-06 DIAGNOSIS — M8589 Other specified disorders of bone density and structure, multiple sites: Secondary | ICD-10-CM

## 2018-08-06 DIAGNOSIS — Z8679 Personal history of other diseases of the circulatory system: Secondary | ICD-10-CM

## 2018-08-06 DIAGNOSIS — M797 Fibromyalgia: Secondary | ICD-10-CM

## 2018-08-06 DIAGNOSIS — I10 Essential (primary) hypertension: Secondary | ICD-10-CM | POA: Diagnosis not present

## 2018-08-06 DIAGNOSIS — R2243 Localized swelling, mass and lump, lower limb, bilateral: Secondary | ICD-10-CM | POA: Diagnosis not present

## 2018-08-06 DIAGNOSIS — M79662 Pain in left lower leg: Secondary | ICD-10-CM | POA: Diagnosis not present

## 2018-08-06 DIAGNOSIS — I509 Heart failure, unspecified: Secondary | ICD-10-CM | POA: Diagnosis not present

## 2018-08-06 DIAGNOSIS — M1120 Other chondrocalcinosis, unspecified site: Secondary | ICD-10-CM

## 2018-08-06 DIAGNOSIS — J449 Chronic obstructive pulmonary disease, unspecified: Secondary | ICD-10-CM | POA: Diagnosis not present

## 2018-08-06 DIAGNOSIS — Z5181 Encounter for therapeutic drug level monitoring: Secondary | ICD-10-CM | POA: Diagnosis not present

## 2018-08-06 DIAGNOSIS — T148XXA Other injury of unspecified body region, initial encounter: Secondary | ICD-10-CM

## 2018-08-06 DIAGNOSIS — I110 Hypertensive heart disease with heart failure: Secondary | ICD-10-CM | POA: Diagnosis not present

## 2018-08-06 DIAGNOSIS — M19042 Primary osteoarthritis, left hand: Secondary | ICD-10-CM

## 2018-08-06 DIAGNOSIS — R600 Localized edema: Secondary | ICD-10-CM | POA: Diagnosis not present

## 2018-08-07 DIAGNOSIS — I509 Heart failure, unspecified: Secondary | ICD-10-CM | POA: Diagnosis not present

## 2018-08-09 LAB — PAIN MGMT, PROFILE 5 W/CONF, U
ALPHAHYDROXYMIDAZOLAM: NEGATIVE ng/mL (ref <50)
AMINOCLONAZEPAM: 166 ng/mL — AB (ref <25)
AMPHETAMINES: NEGATIVE ng/mL (ref <500)
Alphahydroxyalprazolam: NEGATIVE ng/mL (ref <25)
Alphahydroxytriazolam: NEGATIVE ng/mL (ref <50)
BARBITURATES: NEGATIVE ng/mL (ref <300)
Benzodiazepines: POSITIVE ng/mL — AB (ref <100)
COCAINE METABOLITE: NEGATIVE ng/mL (ref <150)
Creatinine: 100.3 mg/dL
HYDROXYETHYLFLURAZEPAM: NEGATIVE ng/mL (ref <50)
LORAZEPAM: NEGATIVE ng/mL (ref <50)
MARIJUANA METABOLITE: NEGATIVE ng/mL (ref <20)
METHADONE METABOLITE: NEGATIVE ng/mL (ref <100)
NORDIAZEPAM: NEGATIVE ng/mL (ref <50)
OXYCODONE: NEGATIVE ng/mL (ref <100)
Opiates: NEGATIVE ng/mL (ref <100)
Oxazepam: NEGATIVE ng/mL (ref <50)
Oxidant: NEGATIVE ug/mL (ref <200)
TEMAZEPAM: NEGATIVE ng/mL (ref <50)
pH: 6.67 (ref 4.5–9.0)

## 2018-08-09 LAB — PAIN MGMT, TRAMADOL W/MEDMATCH, U
Desmethyltramadol: 3959 ng/mL — ABNORMAL HIGH (ref <100)
Tramadol: >10000 ng/mL — ABNORMAL HIGH (ref <100)

## 2018-08-10 DIAGNOSIS — K5901 Slow transit constipation: Secondary | ICD-10-CM | POA: Diagnosis not present

## 2018-08-10 DIAGNOSIS — R1111 Vomiting without nausea: Secondary | ICD-10-CM | POA: Diagnosis not present

## 2018-08-10 DIAGNOSIS — A084 Viral intestinal infection, unspecified: Secondary | ICD-10-CM | POA: Diagnosis not present

## 2018-08-10 DIAGNOSIS — I50812 Chronic right heart failure: Secondary | ICD-10-CM | POA: Diagnosis not present

## 2018-08-10 DIAGNOSIS — J449 Chronic obstructive pulmonary disease, unspecified: Secondary | ICD-10-CM | POA: Diagnosis not present

## 2018-08-10 DIAGNOSIS — E039 Hypothyroidism, unspecified: Secondary | ICD-10-CM | POA: Diagnosis not present

## 2018-08-10 DIAGNOSIS — M797 Fibromyalgia: Secondary | ICD-10-CM | POA: Diagnosis not present

## 2018-08-10 DIAGNOSIS — Z86711 Personal history of pulmonary embolism: Secondary | ICD-10-CM | POA: Diagnosis not present

## 2018-08-10 DIAGNOSIS — I110 Hypertensive heart disease with heart failure: Secondary | ICD-10-CM | POA: Diagnosis not present

## 2018-08-10 DIAGNOSIS — E7800 Pure hypercholesterolemia, unspecified: Secondary | ICD-10-CM | POA: Diagnosis not present

## 2018-08-10 DIAGNOSIS — R112 Nausea with vomiting, unspecified: Secondary | ICD-10-CM | POA: Diagnosis not present

## 2018-08-11 ENCOUNTER — Encounter: Payer: Self-pay | Admitting: Student

## 2018-08-11 NOTE — Progress Notes (Signed)
UDS is consistent with treatment.

## 2018-08-12 ENCOUNTER — Ambulatory Visit (INDEPENDENT_AMBULATORY_CARE_PROVIDER_SITE_OTHER): Payer: Medicare HMO | Admitting: Student

## 2018-08-12 ENCOUNTER — Other Ambulatory Visit (HOSPITAL_COMMUNITY)
Admission: RE | Admit: 2018-08-12 | Discharge: 2018-08-12 | Disposition: A | Discharge status: Home / Self Care | Payer: Medicare HMO | Source: Ambulatory Visit | Attending: Student | Admitting: Student

## 2018-08-12 ENCOUNTER — Telehealth: Payer: Self-pay | Admitting: *Deleted

## 2018-08-12 ENCOUNTER — Encounter: Payer: Self-pay | Admitting: Student

## 2018-08-12 VITALS — BP 118/76 | HR 82 | Ht 61.0 in | Wt 185.4 lb

## 2018-08-12 DIAGNOSIS — I10 Essential (primary) hypertension: Secondary | ICD-10-CM

## 2018-08-12 DIAGNOSIS — E876 Hypokalemia: Secondary | ICD-10-CM

## 2018-08-12 DIAGNOSIS — R600 Localized edema: Secondary | ICD-10-CM | POA: Insufficient documentation

## 2018-08-12 DIAGNOSIS — I5032 Chronic diastolic (congestive) heart failure: Secondary | ICD-10-CM | POA: Diagnosis not present

## 2018-08-12 DIAGNOSIS — I471 Supraventricular tachycardia: Secondary | ICD-10-CM

## 2018-08-12 DIAGNOSIS — I2782 Chronic pulmonary embolism: Secondary | ICD-10-CM | POA: Diagnosis not present

## 2018-08-12 LAB — BASIC METABOLIC PANEL
ANION GAP: 9 (ref 5–15)
BUN: 7 mg/dL — ABNORMAL LOW (ref 8–23)
CHLORIDE: 104 mmol/L (ref 98–111)
CO2: 26 mmol/L (ref 22–32)
Calcium: 9 mg/dL (ref 8.9–10.3)
Creatinine, Ser: 0.97 mg/dL (ref 0.44–1.00)
GFR calc Af Amer: >60 mL/min (ref >60)
GFR calc non Af Amer: >60 mL/min (ref >60)
Glucose, Bld: 96 mg/dL (ref 70–99)
Potassium: 3.2 mmol/L — ABNORMAL LOW (ref 3.5–5.1)
Sodium: 139 mmol/L (ref 135–145)

## 2018-08-12 LAB — BRAIN NATRIURETIC PEPTIDE: B Natriuretic Peptide: 22 pg/mL (ref 0.0–100.0)

## 2018-08-12 MED ORDER — POTASSIUM CHLORIDE ER 10 MEQ PO TBCR: 30.0000 meq | EXTENDED_RELEASE_TABLET | Freq: Every day | ORAL | 6 refills | Status: AC

## 2018-08-12 NOTE — Patient Instructions (Signed)
Medication Instructions:  Your physician recommends that you continue on your current medications as directed. Please refer to the Current Medication list given to you today.   Labwork: TODAY  BNP BMET  Testing/Procedures: NONE  Follow-Up: Your physician recommends that you schedule a follow-up appointment in: 4 MONTHS ([place])    Any Other Special Instructions Will Be Listed Below (If Applicable).     If you need a refill on your cardiac medications before your next appointment, please call your pharmacy.

## 2018-08-12 NOTE — Progress Notes (Signed)
Cardiology Office Note    Date:  08/12/2018   ID:  DAILEY ALBERSON, DOB 05-03-1951, MRN [id number]  PCP:  Neale Burly, MD  Cardiologist: Kate Sable, MD    Chief Complaint  Patient presents with  . Follow-up    recent ED visit; lower extremity edema    History of Present Illness:    Dorothy Ford is a 67 y.o. female with past medical history of chronic diastolic CHF, COPD, prior PE (on Coumadin for anticoagulation), pSVT (s/p ablation in 1998), HTN, and fibromyalgia who presents to the office today for evaluation of lower extremity edema.  She was last examined by Dr. Bronson Ing in 12/2017 and denied any recent chest pain or palpitations at that time. She was continued on her current medication regimen and informed to follow-up in 1 year.  The patient called the office reporting worsening lower extremity edema, therefore an add-on visit was made. Records are received from a recent ED visit at [facility] on 08/10/2018 for nausea and vomiting. She denied any recent sick contacts. She was afebrile upon arrival and labs showed WBC 6.7, Hgb 12.5, Na+ 138, K+ 3.1, creatinine 0.84, AST 19, and ALT 16. Plain abdominal film showed no acute abnormalities. IVF and antiemetics were administered and she was discharged home and informed to follow-up with her PCP.  In talking with the patient today, she reports she was actually evaluated at [facility] in 07/2018 and found to have a fracture along her left middle finger and during that time she was noted to be tachycardic with heart rate ranging from the 130's to 150's. She is unaware of any recurrence of SVT and reports her heart rate improved with administration of Lopressor. Says it was listed as sinus tachycardia on her discharge papers but records from this ED visit are not available at the time of this encounter.   She does report having worsening lower extremity edema over the past several months. She previously underwent  dopplers which were negative for a DVT per her report. She has baseline dyspnea in the setting of known COPD but denies any recent orthopnea or PND. No recent chest pain or palpitations. Reports her nausea and vomiting have now resolved since her recent ED visit.    Past Medical History:  Diagnosis Date  . Acid reflux   . Asthma   . Cardiac dysrhythmia, unspecified   . Carotid bruit    Bilateral  . Cataract   . Chest pain, unspecified   . Chronic airway obstruction, not elsewhere classified   . Chronic sinusitis   . Congestive heart failure, unspecified   . DDD (degenerative disc disease), cervical 07/24/2016  . DDD (degenerative disc disease), lumbar 07/24/2016  . Depressive disorder, not elsewhere classified   . 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 Edema    left leg  . Fibromyalgia   . High cholesterol   . History of rheumatoid arthritis   . Obesity, unspecified   . Obstructive lung disease (generalized) ([place])   . Osteoarthritis of both hands 07/24/2016  . Osteoporosis 07/24/2016  . Palpitations   . Pseudogout involving multiple joints   . SVT (supraventricular tachycardia) ([place])   . Syncope    admitted 09/2011  . Thyroid disease   . Unspecified essential hypertension     Past Surgical History:  Procedure Laterality Date  . CARPAL TUNNEL RELEASE    . CATHETER ABLATION  02/1997   @ [place]  SURGERY BLADDER      Current Medications: Outpatient Medications Prior to Visit  Medication Sig Dispense Refill  . albuterol (PROVENTIL HFA;VENTOLIN HFA) 108 (90 Base) MCG/ACT inhaler Inhale into the lungs every 6 (six) hours as needed for wheezing or shortness of breath.    Marland Kitchen amLODipine (NORVASC) 5 MG tablet Take 5 mg by mouth daily.    Marland Kitchen atorvastatin (LIPITOR) 40 MG tablet Take 40 mg by mouth daily.    Marland Kitchen azelastine (ASTELIN) 0.1 % nasal spray Place 1 spray into both nostrils daily.    . cetirizine (ZYRTEC) 10 MG tablet Take 10 mg by mouth  daily.    . clonazePAM (KLONOPIN) 0.5 MG tablet Take 0.5 mg by mouth at bedtime.     . clotrimazole-betamethasone (LOTRISONE) cream as needed.     . COLCRYS 0.6 MG tablet TAKE 1 TABLET BY MOUTH ONCE DAILY. 90 tablet 0  . cycloSPORINE (RESTASIS) 0.05 % ophthalmic emulsion Place 1 drop into both eyes 2 (two) times daily.    . diclofenac (VOLTAREN) 75 MG EC tablet Take 75 mg by mouth 2 (two) times daily.     . DULoxetine (CYMBALTA) 60 MG capsule Take 60 mg by mouth daily.     . fluticasone (FLONASE) 50 MCG/ACT nasal spray Place 1 spray into the nose daily.     . furosemide (LASIX) 40 MG tablet TAKE 1 TABLET ONCE DAILY. 30 tablet 6  . guaiFENesin (MUCINEX) 600 MG 12 hr tablet Take 600 mg by mouth 2 (two) times daily as needed.    . hydrOXYzine (VISTARIL) 25 MG capsule Take 25 mg by mouth 3 (three) times daily as needed.     Marland Kitchen ipratropium (ATROVENT) 0.06 % nasal spray Place 1 spray into the nose daily.    Marland Kitchen ipratropium-albuterol (DUONEB) 0.5-2.5 (3) MG/3ML SOLN Take 3 mLs by nebulization every 6 (six) hours as needed.     . levalbuterol (XOPENEX HFA) 45 MCG/ACT inhaler Inhale 2 puffs into the lungs every 6 (six) hours as needed for wheezing.    Marland Kitchen levocetirizine (XYZAL) 5 MG tablet Take 5 mg by mouth daily.    Marland Kitchen levothyroxine (SYNTHROID, LEVOTHROID) 50 MCG tablet Take 50 mcg by mouth daily before breakfast.     . loperamide (IMODIUM A-D) 2 MG tablet Take 2 mg by mouth 4 (four) times daily as needed for diarrhea or loose stools.    . metoprolol succinate (TOPROL-XL) 25 MG 24 hr tablet Take 1 tablet (25 mg total) by mouth daily.    . montelukast (SINGULAIR) 10 MG tablet Take 1 tablet by mouth daily.    Marland Kitchen nystatin (MYCOSTATIN) powder Apply topically as needed.    . ondansetron (ZOFRAN) 4 MG tablet as needed.     . pantoprazole (PROTONIX) 40 MG tablet Take 40 mg by mouth daily.    Marland Kitchen PARoxetine (PAXIL) 30 MG tablet Take 30 mg by mouth daily.    . ranitidine (ZANTAC) 300 MG tablet Take 150 mg by mouth at  bedtime.     . Sennosides (SENOKOT PO) Take by mouth.    . Simethicone (GAS-X EXTRA STRENGTH) 125 MG CAPS Take 2 capsules by mouth as needed.    . SYMBICORT 160-4.5 MCG/ACT inhaler Inhale 2 puffs into the lungs 2 (two) times daily.    Marland Kitchen tiZANidine (ZANAFLEX) 4 MG tablet Take 4 mg by mouth as needed.    . traMADol (ULTRAM) 50 MG tablet TAKE 1 OR 2 TABLETS TWICE DAILY. 120 tablet 0  . VOLTAREN 1 %  GEL Apply 1 g topically 2 (two) times daily.    Marland Kitchen warfarin (COUMADIN) 5 MG tablet Take 5 mg by mouth daily. Managed by [place] office  2.5mg  Monday thru Friday. 5 mg on Saturday and Sunday.    . potassium chloride (K-DUR) 10 MEQ tablet Take 2 tablets by mouth daily.     No facility-administered medications prior to visit.      Allergies:   Adhesive [tape] and Codeine   Social History   Socioeconomic History  . Marital status: Single    Spouse name: Not on file  . Number of children: Not on file  . Years of education: Not on file  . Highest education level: Not on file  Occupational History  . Not on file  Social Needs  . Financial resource strain: Not on file  . Food insecurity:    Worry: Not on file    Inability: Not on file  . Transportation needs:    Medical: Not on file    Non-medical: Not on file  Tobacco Use  . Smoking status: Never Smoker  . Smokeless tobacco: Never Used  Substance and Sexual Activity  . Alcohol use: No    Alcohol/week: 0.0 standard drinks  . Drug use: Never  . Sexual activity: Not on file  Lifestyle  . Physical activity:    Days per week: Not on file    Minutes per session: Not on file  . Stress: Not on file  Relationships  . Social connections:    Talks on phone: Not on file    Gets together: Not on file    Attends religious service: Not on file    Active member of club or organization: Not on file    Attends meetings of clubs or organizations: Not on file    Relationship status: Not on file  Other Topics Concern  . Not on file  Social History  Narrative  . Not on file     Family History:  The patient's family history includes Heart disease in her father and mother; Stroke in her unknown relative.   Review of Systems:   Please see the history of present illness.     General:  No chills, fever, night sweats or weight changes.  Cardiovascular:  No chest pain, dyspnea on exertion,  orthopnea, palpitations, paroxysmal nocturnal dyspnea. Positive for edema.  Dermatological: No rash, lesions/masses Respiratory: No cough, dyspnea Urologic: No hematuria, dysuria Abdominal:   No diarrhea, bright red blood per rectum, melena, or hematemesis. Positive for nausea (now resolved).  Neurologic:  No visual changes, wkns, changes in mental status.  All other systems reviewed and are otherwise negative except as noted above.   Physical Exam:    VS:  BP 118/76   Pulse 82   Ht 5\' 1"  (1.549 m)   Wt 185 lb 6.4 oz (84.1 kg)   SpO2 95%   BMI 35.03 kg/m    General: Well developed, well nourished female appearing in no acute distress. Head: Normocephalic, atraumatic, sclera non-icteric, no xanthomas, nares are without discharge.  Neck: No carotid bruits. JVD not elevated.  Lungs: Respirations regular and unlabored, without wheezes or rales.  Heart: Regular rate and rhythm. No S3 or S4.  No murmur, no rubs, or gallops appreciated. Abdomen: Soft, non-tender, non-distended with normoactive bowel sounds. No hepatomegaly. No rebound/guarding. No obvious abdominal masses. Msk:  Strength and tone appear normal for age. No joint deformities or effusions. Extremities: No clubbing or cyanosis. Trace ankle edema bilaterally. Varicose  veins present.  Distal pedal pulses are 2+ bilaterally. Neuro: Alert and oriented X 3. Moves all extremities spontaneously. No focal deficits noted. Psych:  Responds to questions appropriately with a normal affect. Skin: No rashes or lesions noted  Wt Readings from Last 3 Encounters:  08/12/18 185 lb 6.4 oz (84.1 kg)    08/06/18 185 lb (83.9 kg)  01/29/18 185 lb (83.9 kg)     Studies/Labs Reviewed:   EKG:  EKG is not ordered today.   Recent Labs: 06/26/2018: ALT 18; Hemoglobin 12.0; Platelets 317 08/12/2018: B Natriuretic Peptide 22.0; BUN 7; Creatinine, Ser 0.97; Potassium 3.2; Sodium 139   Lipid Panel No results found for: CHOL, TRIG, HDL, CHOLHDL, VLDL, LDLCALC, LDLDIRECT  Additional studies/ records that were reviewed today include:   Echocardiogram: 09/2011   NST: 10/2016  There was no ST segment deviation noted during stress.  The study is normal. There are no myocardial perfusion defects  This is a low risk study.  The left ventricular ejection fraction is hyperdynamic (>65%).  Lower Extremity Dopplers: 03/2018 IMPRESSION: No evidence of right lower extremity deep venous thrombosis.   Assessment:    1. Chronic diastolic heart failure ([place])   2. Lower extremity edema   3. PSVT (paroxysmal supraventricular tachycardia) ([place])   4. Essential hypertension   5. Other chronic pulmonary embolism without acute cor pulmonale (HCC)   6. Hypokalemia      Plan:   In order of problems listed above:  1. Chronic Diastolic CHF/ Lower Extremity Edema - She reports having worsening lower extremity edema over the past several months but upon examination, there is only trace edema along her ankles and no pitting edema is appreciated. She does have varicose veins and I suspect this might contribute to her symptoms at times. I recommended the use of compression stockings but she reports not tolerating these well in the past. Did review the importance of elevating her lower extremities. Will check a BNP today to assess fluid level but would not anticipate further titration of her Lasix at this time. She does remain on Lasix 40 mg daily. Recent Dopplers were obtained and were negative for a DVT per her report and she has also remained on Coumadin for anticoagulation given a history of PE which  makes a recurrent event less likely. Has also been on her same dose of Amlodipine for several years and would not make adjustments at this time as I can only appreciate minimal ankle edema on examination and doubt this is a medication side-effect.   2. pSVT -she reports having sinus tachycardia around the time she fractured her left middle finger but is unaware of any repeat episodes since. No recent palpitations. Remains on Toprol-XL 25 mg daily.  3. HTN - BP is well controlled at 118/76 during today's visit. Continue Amlodipine 5 mg daily and Toprol-XL 25 mg daily.  4. History of PE - Reports that her respiratory status has been at baseline and denies any recent changes in this.  Denies any evidence of active bleeding and she remains on Coumadin for anticoagulation. INR is followed by her PCP.   5. Hypokalemia - K+ was low at 3.1 when checked this weekend while in the ED. Will recheck a BMET today. She remains on K-Dur 20 mEq daily.   Medication Adjustments/Labs and Tests Ordered: Current medicines are reviewed at length with the patient today.  Concerns regarding medicines are outlined above.  Medication changes, Labs and Tests ordered today are listed in the  Patient Instructions below. Patient Instructions  Medication Instructions:  Your physician recommends that you continue on your current medications as directed. Please refer to the Current Medication list given to you today.   Labwork: TODAY  BNP BMET  Testing/Procedures: NONE  Follow-Up: Your physician recommends that you schedule a follow-up appointment in: 4 MONTHS ([place])    Any Other Special Instructions Will Be Listed Below (If Applicable).     If you need a refill on your cardiac medications before your next appointment, please call your pharmacy.      Signed, Erma Heritage, PA-C  08/12/2018 5:15 PM    [facility] [address] [place], [place] [address] Phone: [phone]

## 2018-08-12 NOTE — Telephone Encounter (Signed)
-----   Message from Erma Heritage, [place] sent at 08/12/2018  4:23 PM EST ----- Please let the patient know that her fluid level is within a normal range.  Kidney function and sodium within normal limits.  Her potassium does remain low (was 3.1 when checked at [facility] this past weekend).  Recommend increasing K-dur to 30 mEq daily and she will need a repeat BMET in 3-4 weeks which can be obtained by [place] or her PCP. Please forward a copy of labs to Neale Burly, MD.

## 2018-08-15 DIAGNOSIS — Z6836 Body mass index (BMI) 36.0-36.9, adult: Secondary | ICD-10-CM | POA: Diagnosis not present

## 2018-08-15 DIAGNOSIS — Z7901 Long term (current) use of anticoagulants: Secondary | ICD-10-CM | POA: Diagnosis not present

## 2018-08-15 DIAGNOSIS — S62633A Displaced fracture of distal phalanx of left middle finger, initial encounter for closed fracture: Secondary | ICD-10-CM | POA: Diagnosis not present

## 2018-08-20 DIAGNOSIS — M542 Cervicalgia: Secondary | ICD-10-CM | POA: Diagnosis not present

## 2018-08-20 DIAGNOSIS — M546 Pain in thoracic spine: Secondary | ICD-10-CM | POA: Diagnosis not present

## 2018-08-20 DIAGNOSIS — M9901 Segmental and somatic dysfunction of cervical region: Secondary | ICD-10-CM | POA: Diagnosis not present

## 2018-08-20 DIAGNOSIS — M9903 Segmental and somatic dysfunction of lumbar region: Secondary | ICD-10-CM | POA: Diagnosis not present

## 2018-08-20 DIAGNOSIS — I10 Essential (primary) hypertension: Secondary | ICD-10-CM | POA: Diagnosis not present

## 2018-08-20 DIAGNOSIS — M545 Low back pain: Secondary | ICD-10-CM | POA: Diagnosis not present

## 2018-08-20 DIAGNOSIS — M9902 Segmental and somatic dysfunction of thoracic region: Secondary | ICD-10-CM | POA: Diagnosis not present

## 2018-08-29 DIAGNOSIS — R000 Tachycardia, unspecified: Secondary | ICD-10-CM | POA: Diagnosis not present

## 2018-08-29 DIAGNOSIS — Z6836 Body mass index (BMI) 36.0-36.9, adult: Secondary | ICD-10-CM | POA: Diagnosis not present

## 2018-08-29 DIAGNOSIS — S62633A Displaced fracture of distal phalanx of left middle finger, initial encounter for closed fracture: Secondary | ICD-10-CM | POA: Diagnosis not present

## 2018-08-31 DIAGNOSIS — Z0389 Encounter for observation for other suspected diseases and conditions ruled out: Secondary | ICD-10-CM | POA: Diagnosis not present

## 2018-08-31 DIAGNOSIS — M79642 Pain in left hand: Secondary | ICD-10-CM | POA: Diagnosis not present

## 2018-08-31 DIAGNOSIS — Z8781 Personal history of (healed) traumatic fracture: Secondary | ICD-10-CM | POA: Diagnosis not present

## 2018-09-01 DIAGNOSIS — Z124 Encounter for screening for malignant neoplasm of cervix: Secondary | ICD-10-CM | POA: Diagnosis not present

## 2018-09-01 DIAGNOSIS — Z01419 Encounter for gynecological examination (general) (routine) without abnormal findings: Secondary | ICD-10-CM | POA: Diagnosis not present

## 2018-09-04 DIAGNOSIS — Z1231 Encounter for screening mammogram for malignant neoplasm of breast: Secondary | ICD-10-CM | POA: Diagnosis not present

## 2018-09-05 ENCOUNTER — Other Ambulatory Visit: Payer: Self-pay | Admitting: Rheumatology

## 2018-09-05 NOTE — Telephone Encounter (Signed)
Last Visit: 08/06/18 Next Visit: 02/05/19 Labs: 06/26/18 WNL  Okay to refill per Dr. Estanislado Pandy

## 2018-09-12 DIAGNOSIS — Z6836 Body mass index (BMI) 36.0-36.9, adult: Secondary | ICD-10-CM | POA: Diagnosis not present

## 2018-09-12 DIAGNOSIS — Z7901 Long term (current) use of anticoagulants: Secondary | ICD-10-CM | POA: Diagnosis not present

## 2018-09-12 DIAGNOSIS — S62633A Displaced fracture of distal phalanx of left middle finger, initial encounter for closed fracture: Secondary | ICD-10-CM | POA: Diagnosis not present

## 2018-09-12 DIAGNOSIS — R000 Tachycardia, unspecified: Secondary | ICD-10-CM | POA: Diagnosis not present

## 2018-09-22 DIAGNOSIS — M5033 Other cervical disc degeneration, cervicothoracic region: Secondary | ICD-10-CM | POA: Diagnosis not present

## 2018-09-22 DIAGNOSIS — M4312 Spondylolisthesis, cervical region: Secondary | ICD-10-CM | POA: Diagnosis not present

## 2018-09-22 DIAGNOSIS — M4803 Spinal stenosis, cervicothoracic region: Secondary | ICD-10-CM | POA: Diagnosis not present

## 2018-09-22 DIAGNOSIS — M542 Cervicalgia: Secondary | ICD-10-CM | POA: Diagnosis not present

## 2018-09-22 DIAGNOSIS — M4802 Spinal stenosis, cervical region: Secondary | ICD-10-CM | POA: Diagnosis not present

## 2018-09-30 ENCOUNTER — Other Ambulatory Visit: Payer: Self-pay | Admitting: Physician Assistant

## 2018-09-30 NOTE — Telephone Encounter (Signed)
Last Visit: 08/06/18 Next Visit: 02/05/19 UDS: 08/06/18 Narc Agreement: 08/06/18  Okay to refill Tramadol?  

## 2018-10-03 DIAGNOSIS — Z7901 Long term (current) use of anticoagulants: Secondary | ICD-10-CM | POA: Diagnosis not present

## 2018-10-03 DIAGNOSIS — R000 Tachycardia, unspecified: Secondary | ICD-10-CM | POA: Diagnosis not present

## 2018-10-03 DIAGNOSIS — S62633A Displaced fracture of distal phalanx of left middle finger, initial encounter for closed fracture: Secondary | ICD-10-CM | POA: Diagnosis not present

## 2018-10-03 DIAGNOSIS — Z6836 Body mass index (BMI) 36.0-36.9, adult: Secondary | ICD-10-CM | POA: Diagnosis not present

## 2018-10-06 DIAGNOSIS — M9903 Segmental and somatic dysfunction of lumbar region: Secondary | ICD-10-CM | POA: Diagnosis not present

## 2018-10-06 DIAGNOSIS — M9901 Segmental and somatic dysfunction of cervical region: Secondary | ICD-10-CM | POA: Diagnosis not present

## 2018-10-06 DIAGNOSIS — M9902 Segmental and somatic dysfunction of thoracic region: Secondary | ICD-10-CM | POA: Diagnosis not present

## 2018-10-06 DIAGNOSIS — M545 Low back pain: Secondary | ICD-10-CM | POA: Diagnosis not present

## 2018-10-06 DIAGNOSIS — I10 Essential (primary) hypertension: Secondary | ICD-10-CM | POA: Diagnosis not present

## 2018-10-06 DIAGNOSIS — M546 Pain in thoracic spine: Secondary | ICD-10-CM | POA: Diagnosis not present

## 2018-10-06 DIAGNOSIS — M542 Cervicalgia: Secondary | ICD-10-CM | POA: Diagnosis not present

## 2018-10-10 DIAGNOSIS — G8929 Other chronic pain: Secondary | ICD-10-CM | POA: Diagnosis not present

## 2018-10-10 DIAGNOSIS — M797 Fibromyalgia: Secondary | ICD-10-CM | POA: Diagnosis not present

## 2018-10-10 DIAGNOSIS — M545 Low back pain: Secondary | ICD-10-CM | POA: Diagnosis not present

## 2018-10-10 DIAGNOSIS — M79622 Pain in left upper arm: Secondary | ICD-10-CM | POA: Diagnosis not present

## 2018-10-10 DIAGNOSIS — M5010 Cervical disc disorder with radiculopathy, unspecified cervical region: Secondary | ICD-10-CM | POA: Diagnosis not present

## 2018-10-13 DIAGNOSIS — M542 Cervicalgia: Secondary | ICD-10-CM | POA: Diagnosis not present

## 2018-10-13 DIAGNOSIS — M546 Pain in thoracic spine: Secondary | ICD-10-CM | POA: Diagnosis not present

## 2018-10-13 DIAGNOSIS — M9902 Segmental and somatic dysfunction of thoracic region: Secondary | ICD-10-CM | POA: Diagnosis not present

## 2018-10-13 DIAGNOSIS — M9903 Segmental and somatic dysfunction of lumbar region: Secondary | ICD-10-CM | POA: Diagnosis not present

## 2018-10-13 DIAGNOSIS — M9901 Segmental and somatic dysfunction of cervical region: Secondary | ICD-10-CM | POA: Diagnosis not present

## 2018-10-13 DIAGNOSIS — I10 Essential (primary) hypertension: Secondary | ICD-10-CM | POA: Diagnosis not present

## 2018-10-13 DIAGNOSIS — M545 Low back pain: Secondary | ICD-10-CM | POA: Diagnosis not present

## 2018-10-22 DIAGNOSIS — I10 Essential (primary) hypertension: Secondary | ICD-10-CM | POA: Diagnosis not present

## 2018-10-22 DIAGNOSIS — I5032 Chronic diastolic (congestive) heart failure: Secondary | ICD-10-CM | POA: Diagnosis not present

## 2018-10-22 DIAGNOSIS — M797 Fibromyalgia: Secondary | ICD-10-CM | POA: Diagnosis not present

## 2018-10-22 DIAGNOSIS — J4541 Moderate persistent asthma with (acute) exacerbation: Secondary | ICD-10-CM | POA: Diagnosis not present

## 2018-10-24 DIAGNOSIS — Z7901 Long term (current) use of anticoagulants: Secondary | ICD-10-CM | POA: Diagnosis not present

## 2018-10-27 DIAGNOSIS — M545 Low back pain: Secondary | ICD-10-CM | POA: Diagnosis not present

## 2018-10-27 DIAGNOSIS — I10 Essential (primary) hypertension: Secondary | ICD-10-CM | POA: Diagnosis not present

## 2018-10-27 DIAGNOSIS — M9901 Segmental and somatic dysfunction of cervical region: Secondary | ICD-10-CM | POA: Diagnosis not present

## 2018-10-27 DIAGNOSIS — M546 Pain in thoracic spine: Secondary | ICD-10-CM | POA: Diagnosis not present

## 2018-10-27 DIAGNOSIS — M9903 Segmental and somatic dysfunction of lumbar region: Secondary | ICD-10-CM | POA: Diagnosis not present

## 2018-10-27 DIAGNOSIS — M9902 Segmental and somatic dysfunction of thoracic region: Secondary | ICD-10-CM | POA: Diagnosis not present

## 2018-10-27 DIAGNOSIS — M542 Cervicalgia: Secondary | ICD-10-CM | POA: Diagnosis not present

## 2018-11-01 DIAGNOSIS — M797 Fibromyalgia: Secondary | ICD-10-CM | POA: Diagnosis not present

## 2018-11-01 DIAGNOSIS — I509 Heart failure, unspecified: Secondary | ICD-10-CM | POA: Diagnosis not present

## 2018-11-01 DIAGNOSIS — I110 Hypertensive heart disease with heart failure: Secondary | ICD-10-CM | POA: Diagnosis not present

## 2018-11-01 DIAGNOSIS — R5381 Other malaise: Secondary | ICD-10-CM | POA: Diagnosis not present

## 2018-11-01 DIAGNOSIS — E7800 Pure hypercholesterolemia, unspecified: Secondary | ICD-10-CM | POA: Diagnosis not present

## 2018-11-01 DIAGNOSIS — R197 Diarrhea, unspecified: Secondary | ICD-10-CM | POA: Diagnosis not present

## 2018-11-01 DIAGNOSIS — J449 Chronic obstructive pulmonary disease, unspecified: Secondary | ICD-10-CM | POA: Diagnosis not present

## 2018-11-01 DIAGNOSIS — E039 Hypothyroidism, unspecified: Secondary | ICD-10-CM | POA: Diagnosis not present

## 2018-11-01 DIAGNOSIS — A09 Infectious gastroenteritis and colitis, unspecified: Secondary | ICD-10-CM | POA: Diagnosis not present

## 2018-11-01 DIAGNOSIS — G4730 Sleep apnea, unspecified: Secondary | ICD-10-CM | POA: Diagnosis not present

## 2018-11-01 DIAGNOSIS — Z86711 Personal history of pulmonary embolism: Secondary | ICD-10-CM | POA: Diagnosis not present

## 2018-11-05 DIAGNOSIS — Z6835 Body mass index (BMI) 35.0-35.9, adult: Secondary | ICD-10-CM | POA: Diagnosis not present

## 2018-11-05 DIAGNOSIS — J309 Allergic rhinitis, unspecified: Secondary | ICD-10-CM | POA: Diagnosis not present

## 2018-11-07 ENCOUNTER — Other Ambulatory Visit: Payer: Self-pay | Admitting: Cardiovascular Disease

## 2018-11-14 DIAGNOSIS — J309 Allergic rhinitis, unspecified: Secondary | ICD-10-CM | POA: Diagnosis not present

## 2018-11-14 DIAGNOSIS — Z6835 Body mass index (BMI) 35.0-35.9, adult: Secondary | ICD-10-CM | POA: Diagnosis not present

## 2018-11-14 DIAGNOSIS — E6609 Other obesity due to excess calories: Secondary | ICD-10-CM | POA: Diagnosis not present

## 2018-11-14 DIAGNOSIS — Z7901 Long term (current) use of anticoagulants: Secondary | ICD-10-CM | POA: Diagnosis not present

## 2018-11-24 DIAGNOSIS — M5010 Cervical disc disorder with radiculopathy, unspecified cervical region: Secondary | ICD-10-CM | POA: Diagnosis not present

## 2018-11-24 DIAGNOSIS — M545 Low back pain: Secondary | ICD-10-CM | POA: Diagnosis not present

## 2018-11-24 DIAGNOSIS — M79622 Pain in left upper arm: Secondary | ICD-10-CM | POA: Diagnosis not present

## 2018-11-24 DIAGNOSIS — M797 Fibromyalgia: Secondary | ICD-10-CM | POA: Diagnosis not present

## 2018-11-26 DIAGNOSIS — E038 Other specified hypothyroidism: Secondary | ICD-10-CM | POA: Diagnosis not present

## 2018-11-26 DIAGNOSIS — Z6835 Body mass index (BMI) 35.0-35.9, adult: Secondary | ICD-10-CM | POA: Diagnosis not present

## 2018-11-26 DIAGNOSIS — J309 Allergic rhinitis, unspecified: Secondary | ICD-10-CM | POA: Diagnosis not present

## 2018-11-26 DIAGNOSIS — Z7901 Long term (current) use of anticoagulants: Secondary | ICD-10-CM | POA: Diagnosis not present

## 2018-11-26 DIAGNOSIS — I10 Essential (primary) hypertension: Secondary | ICD-10-CM | POA: Diagnosis not present

## 2018-11-26 DIAGNOSIS — J45909 Unspecified asthma, uncomplicated: Secondary | ICD-10-CM | POA: Diagnosis not present

## 2018-12-01 DIAGNOSIS — M9901 Segmental and somatic dysfunction of cervical region: Secondary | ICD-10-CM | POA: Diagnosis not present

## 2018-12-01 DIAGNOSIS — M542 Cervicalgia: Secondary | ICD-10-CM | POA: Diagnosis not present

## 2018-12-01 DIAGNOSIS — M546 Pain in thoracic spine: Secondary | ICD-10-CM | POA: Diagnosis not present

## 2018-12-01 DIAGNOSIS — M545 Low back pain: Secondary | ICD-10-CM | POA: Diagnosis not present

## 2018-12-01 DIAGNOSIS — I10 Essential (primary) hypertension: Secondary | ICD-10-CM | POA: Diagnosis not present

## 2018-12-01 DIAGNOSIS — M9903 Segmental and somatic dysfunction of lumbar region: Secondary | ICD-10-CM | POA: Diagnosis not present

## 2018-12-01 DIAGNOSIS — M9902 Segmental and somatic dysfunction of thoracic region: Secondary | ICD-10-CM | POA: Diagnosis not present

## 2018-12-05 ENCOUNTER — Other Ambulatory Visit: Payer: Self-pay | Admitting: Physician Assistant

## 2018-12-08 NOTE — Telephone Encounter (Signed)
Last Visit: 08/06/18 Next Visit: 02/05/19 UDS: 08/06/18 Narc Agreement: 08/06/18  Okay to refill Tramadol?

## 2018-12-09 DIAGNOSIS — Z7901 Long term (current) use of anticoagulants: Secondary | ICD-10-CM | POA: Diagnosis not present

## 2018-12-15 ENCOUNTER — Telehealth: Payer: Self-pay | Admitting: *Deleted

## 2018-12-15 NOTE — Telephone Encounter (Signed)
   Primary Cardiologist:  Kate Sable, MD   Patient contacted.  History reviewed.  No symptoms to suggest any unstable cardiac conditions.  Based on discussion, with current pandemic situation, we will be postponing this appointment for [place] with a plan for f/u in July 2020 or sooner if feasible/necessary.  If symptoms change, she has been instructed to contact our office.    Marlou Sa, RN  12/15/2018 2:50 PM         .

## 2018-12-17 ENCOUNTER — Ambulatory Visit: Payer: Medicare HMO | Admitting: Cardiovascular Disease

## 2018-12-17 DIAGNOSIS — M9902 Segmental and somatic dysfunction of thoracic region: Secondary | ICD-10-CM | POA: Diagnosis not present

## 2018-12-17 DIAGNOSIS — M9903 Segmental and somatic dysfunction of lumbar region: Secondary | ICD-10-CM | POA: Diagnosis not present

## 2018-12-17 DIAGNOSIS — M9901 Segmental and somatic dysfunction of cervical region: Secondary | ICD-10-CM | POA: Diagnosis not present

## 2018-12-17 DIAGNOSIS — M546 Pain in thoracic spine: Secondary | ICD-10-CM | POA: Diagnosis not present

## 2018-12-17 DIAGNOSIS — I10 Essential (primary) hypertension: Secondary | ICD-10-CM | POA: Diagnosis not present

## 2018-12-17 DIAGNOSIS — M542 Cervicalgia: Secondary | ICD-10-CM | POA: Diagnosis not present

## 2018-12-17 DIAGNOSIS — M545 Low back pain: Secondary | ICD-10-CM | POA: Diagnosis not present

## 2018-12-31 DIAGNOSIS — M9903 Segmental and somatic dysfunction of lumbar region: Secondary | ICD-10-CM | POA: Diagnosis not present

## 2018-12-31 DIAGNOSIS — M9901 Segmental and somatic dysfunction of cervical region: Secondary | ICD-10-CM | POA: Diagnosis not present

## 2018-12-31 DIAGNOSIS — M545 Low back pain: Secondary | ICD-10-CM | POA: Diagnosis not present

## 2018-12-31 DIAGNOSIS — M542 Cervicalgia: Secondary | ICD-10-CM | POA: Diagnosis not present

## 2018-12-31 DIAGNOSIS — I10 Essential (primary) hypertension: Secondary | ICD-10-CM | POA: Diagnosis not present

## 2018-12-31 DIAGNOSIS — M9902 Segmental and somatic dysfunction of thoracic region: Secondary | ICD-10-CM | POA: Diagnosis not present

## 2018-12-31 DIAGNOSIS — M546 Pain in thoracic spine: Secondary | ICD-10-CM | POA: Diagnosis not present

## 2019-01-02 DIAGNOSIS — Z7901 Long term (current) use of anticoagulants: Secondary | ICD-10-CM | POA: Diagnosis not present

## 2019-01-07 DIAGNOSIS — M9901 Segmental and somatic dysfunction of cervical region: Secondary | ICD-10-CM | POA: Diagnosis not present

## 2019-01-07 DIAGNOSIS — M9903 Segmental and somatic dysfunction of lumbar region: Secondary | ICD-10-CM | POA: Diagnosis not present

## 2019-01-07 DIAGNOSIS — M545 Low back pain: Secondary | ICD-10-CM | POA: Diagnosis not present

## 2019-01-07 DIAGNOSIS — M9902 Segmental and somatic dysfunction of thoracic region: Secondary | ICD-10-CM | POA: Diagnosis not present

## 2019-01-07 DIAGNOSIS — I10 Essential (primary) hypertension: Secondary | ICD-10-CM | POA: Diagnosis not present

## 2019-01-07 DIAGNOSIS — M542 Cervicalgia: Secondary | ICD-10-CM | POA: Diagnosis not present

## 2019-01-07 DIAGNOSIS — M546 Pain in thoracic spine: Secondary | ICD-10-CM | POA: Diagnosis not present

## 2019-01-09 DIAGNOSIS — I10 Essential (primary) hypertension: Secondary | ICD-10-CM | POA: Diagnosis not present

## 2019-01-09 DIAGNOSIS — J45909 Unspecified asthma, uncomplicated: Secondary | ICD-10-CM | POA: Diagnosis not present

## 2019-01-09 DIAGNOSIS — E038 Other specified hypothyroidism: Secondary | ICD-10-CM | POA: Diagnosis not present

## 2019-01-16 DIAGNOSIS — Z7901 Long term (current) use of anticoagulants: Secondary | ICD-10-CM | POA: Diagnosis not present

## 2019-01-21 DIAGNOSIS — M546 Pain in thoracic spine: Secondary | ICD-10-CM | POA: Diagnosis not present

## 2019-01-21 DIAGNOSIS — M542 Cervicalgia: Secondary | ICD-10-CM | POA: Diagnosis not present

## 2019-01-21 DIAGNOSIS — M9902 Segmental and somatic dysfunction of thoracic region: Secondary | ICD-10-CM | POA: Diagnosis not present

## 2019-01-21 DIAGNOSIS — M9903 Segmental and somatic dysfunction of lumbar region: Secondary | ICD-10-CM | POA: Diagnosis not present

## 2019-01-21 DIAGNOSIS — I10 Essential (primary) hypertension: Secondary | ICD-10-CM | POA: Diagnosis not present

## 2019-01-21 DIAGNOSIS — M545 Low back pain: Secondary | ICD-10-CM | POA: Diagnosis not present

## 2019-01-21 DIAGNOSIS — M9901 Segmental and somatic dysfunction of cervical region: Secondary | ICD-10-CM | POA: Diagnosis not present

## 2019-01-22 DIAGNOSIS — M797 Fibromyalgia: Secondary | ICD-10-CM | POA: Diagnosis not present

## 2019-01-22 DIAGNOSIS — M545 Low back pain: Secondary | ICD-10-CM | POA: Diagnosis not present

## 2019-01-22 DIAGNOSIS — M79622 Pain in left upper arm: Secondary | ICD-10-CM | POA: Diagnosis not present

## 2019-01-22 DIAGNOSIS — M5010 Cervical disc disorder with radiculopathy, unspecified cervical region: Secondary | ICD-10-CM | POA: Diagnosis not present

## 2019-01-22 NOTE — Progress Notes (Signed)
Office Visit Note  Patient: Dorothy Ford             Date of Birth: Jun 13, 1951           MRN: [id number]             PCP: Neale Burly, MD Referring: Neale Burly, MD Visit Date: 02/05/2019 Occupation: @GUAROCC @  Subjective:  Generalized pain   History of Present Illness: CHASTIDY RANKER is a 68 y.o. female with history of pseudogout, osteoarthritis, and DDD. She takes Colchicine 0.6 mg 1 tablet by mouth daily. She reports pain in the left shoulder joint and pain in both hands.  She denies any joint swelling.  She has right lateral epicondylitis.  She continues to chronic pain in both knee joints.  He experiences nocturnal pain in both knee joints.  She was switched from tramadol to oxycodone 5 mg, which has been more effective.  She has generalized pain due to fibromyalgia.  She experiences chronic fatigue.   Activities of Daily Living:  Patient reports morning stiffness for 1-2 hours.   Patient Reports nocturnal pain.  Difficulty dressing/grooming: Denies Difficulty climbing stairs: Reports Difficulty getting out of chair: Reports Difficulty using hands for taps, buttons, cutlery, and/or writing: Denies  Review of Systems  Constitutional: Positive for fatigue.  HENT: Positive for mouth dryness. Negative for mouth sores and nose dryness.   Eyes: Negative for pain, visual disturbance and dryness.  Respiratory: Negative for cough, hemoptysis, shortness of breath and difficulty breathing.   Cardiovascular: Negative for chest pain, palpitations, hypertension and swelling in legs/feet.  Gastrointestinal: Negative for blood in stool, constipation and diarrhea.  Endocrine: Negative for increased urination.  Genitourinary: Negative for painful urination.  Musculoskeletal: Positive for arthralgias, joint pain and morning stiffness. Negative for joint swelling, myalgias, muscle weakness, muscle tenderness and myalgias.  Skin: Negative for color change, pallor, rash, hair loss,  nodules/bumps, skin tightness, ulcers and sensitivity to sunlight.  Allergic/Immunologic: Negative for susceptible to infections.  Neurological: Negative for dizziness, numbness, headaches and weakness.  Hematological: Negative for swollen glands.  Psychiatric/Behavioral: Negative for depressed mood and sleep disturbance. The patient is nervous/anxious.     PMFS History:  Patient Active Problem List   Diagnosis Date Noted  . 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 Chondromalacia patellae, right knee 01/15/2017  . Osteoarthritis of both hands 07/24/2016  . DDD (degenerative disc disease), cervical 07/24/2016  . DDD (degenerative disc disease), lumbar 07/24/2016  . Osteoporosis 07/24/2016  . Glaucoma 07/24/2016  . Sleep apnea 07/24/2016  . Pseudogout 07/19/2016  . Primary osteoarthritis of both knees 07/19/2016  . Fibromyalgia 07/19/2016  . 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 Pulmonary embolism ([place]) 01/18/2012  . Anxiety 01/18/2012  . Syncope   . RHEUMATOID LUNG 12/30/2009  . OBESITY, UNSPECIFIED 06/02/2009  . DEPRESSIVE DISORDER NOT ELSEWHERE CLASSIFIED 06/02/2009  . SUPRAVENTRICULAR TACHYCARDIA 06/02/2009  . Chronic diastolic heart failure ([place]) 06/02/2009  . COPD 06/02/2009  . DIAPHRAGMAT HERN W/O MENTION OBSTRUCTION/GANGREN 06/02/2009  . PALPITATIONS 06/02/2009  . CHEST PAIN UNSPECIFIED 06/02/2009    Past Medical History:  Diagnosis Date  . Acid reflux   . Asthma   . Cardiac dysrhythmia, unspecified   . Carotid bruit    Bilateral  . Cataract   . Chest pain, unspecified   . Chronic airway obstruction, not elsewhere classified   . Chronic sinusitis   . Congestive heart failure, unspecified   . DDD (degenerative disc disease), cervical 07/24/2016  . DDD (degenerative disc disease),  lumbar 07/24/2016  . Depressive disorder, not elsewhere classified   . 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 Edema    left leg  . Fibromyalgia   .  High cholesterol   . History of rheumatoid arthritis   . Obesity, unspecified   . Obstructive lung disease (generalized) ([place])   . Osteoarthritis of both hands 07/24/2016  . Osteoporosis 07/24/2016  . Palpitations   . Pseudogout involving multiple joints   . SVT (supraventricular tachycardia) ([place])   . Syncope    admitted 09/2011  . Thyroid disease   . Unspecified essential hypertension     Family History  Problem Relation Age of Onset  . Heart disease Mother   . Hypertension Mother   . Stroke Other   . Heart disease Father   . Hypertension Father   . Kidney failure Sister   . Asthma Sister    Past Surgical History:  Procedure Laterality Date  . CARPAL TUNNEL RELEASE    . CATHETER ABLATION  02/1997   @ [facility]     Social History   Social History Narrative  . Not on file    There is no immunization history on file for this patient.   Objective: Vital Signs: BP 128/78 (BP Location: Right Arm, Patient Position: Sitting, Cuff Size: Small)   Pulse (!) 58   Resp 12   Ht 5\' 1"  (1.549 m)   Wt 189 lb 12.8 oz (86.1 kg)   BMI 35.86 kg/m    Physical Exam Vitals signs and nursing note reviewed.  Constitutional:      Appearance: She is well-developed.  HENT:     Head: Normocephalic and atraumatic.  Eyes:     Conjunctiva/sclera: Conjunctivae normal.  Neck:     Musculoskeletal: Normal range of motion.  Cardiovascular:     Rate and Rhythm: Normal rate and regular rhythm.     Heart sounds: Normal heart sounds.  Pulmonary:     Effort: Pulmonary effort is normal.     Breath sounds: Normal breath sounds.  Abdominal:     General: Bowel sounds are normal.     Palpations: Abdomen is soft.  Lymphadenopathy:     Cervical: No cervical adenopathy.  Skin:    General: Skin is warm and dry.     Capillary Refill: Capillary refill takes less than 2 seconds.  Neurological:     Mental Status: She is alert and oriented to person, place, and time.   Psychiatric:        Behavior: Behavior normal.      Musculoskeletal Exam: Generalized hyperalgesia. C-spine limited range of motion.  Thoracic kyphosis noted.  Limited range of motion of lumbar spine with discomfort.  Right shoulder has full range of motion with no discomfort.  Left shoulder has limited internal rotation and abduction to about 120 degrees.  Elbow joints have good range of motion.  Right lateral epicondylitis.  Wrist joints have good range of motion no tenderness or synovitis.  He has PIP and DIP synovial thickening consistent with osteoarthritis of bilateral hands.  She has tenderness of the left 3rd PIP joint from a previous fracture.  Hip joints, knee joints, ankle joints, MTPs, PIPs, and DIPs good ROM with no synovitis.  No warmth or effusion of knee joints.  No tenderness or swelling of ankle joints.    CDAI Exam: CDAI Score: Not documented Patient Global Assessment: Not documented; Provider Global Assessment: Not documented Swollen: Not documented;  Tender: Not documented Joint Exam   Not documented   There is currently no information documented on the homunculus. Go to the Rheumatology activity and complete the homunculus joint exam.  Investigation: No additional findings.  Imaging: Xr Knee 3 View Left  Result Date: 02/05/2019 Moderate medial compartment narrowing with chondrocalcinosis was noted.  Severe patellofemoral narrowing was noted. Impression: These findings are consistent with moderate osteoarthritis, severe chondromalacia patella and chondrocalcinosis.  Xr Knee 3 View Right  Result Date: 02/05/2019 Moderate medial compartment narrowing with chondrocalcinosis was noted.  Severe patellofemoral narrowing was noted. Impression: These findings are consistent with moderate osteoarthritis, severe chondromalacia patella and chondrocalcinosis.   Recent Labs: Lab Results  Component Value Date   WBC 7.0 06/26/2018   HGB 12.0 06/26/2018   PLT 317 06/26/2018    NA 139 08/12/2018   K 3.2 (L) 08/12/2018   CL 104 08/12/2018   CO2 26 08/12/2018   GLUCOSE 96 08/12/2018   BUN 7 (L) 08/12/2018   CREATININE 0.97 08/12/2018   BILITOT 0.9 06/26/2018   AST 18 06/26/2018   ALT 18 06/26/2018   PROT 6.7 06/26/2018   CALCIUM 9.0 08/12/2018   GFRAA >60 08/12/2018    Speciality Comments: No specialty comments available.  Procedures:  Large Joint Inj: L knee on 02/05/2019 2:19 PM Indications: pain Details: 27 G 1.5 in needle, medial approach  Arthrogram: No  Medications: 1.5 mL lidocaine 1 %; 40 mg triamcinolone acetonide 40 MG/ML Aspirate: 0 mL Outcome: tolerated well, no immediate complications Procedure, treatment alternatives, risks and benefits explained, specific risks discussed. Consent was given by the patient. Immediately prior to procedure a time out was called to verify the correct patient, procedure, equipment, support staff and site/side marked as required. Patient was prepped and draped in the usual sterile fashion.     Allergies: Adhesive [tape] and Codeine   Assessment / Plan:     Visit Diagnoses: Pseudogout -She has not had any recent pseudogout flares. She takes Colcrys 0.6 mg 1 tablet by mouth daily.  Primary osteoarthritis of both hands: She has PIP and DIP synovial thickening consistent with osteoarthritis of both hands.  She has no synovitis.  In November 2019 she fell and sustained an avulsion fracture of the 3rd digit. She continues to have tenderness of the left 3rd PIP joint.  Joint protection and muscle strengthening were discussed.   Primary osteoarthritis of both knees: She presents today with increased knee joint pain bilaterally.  She has good ROM bilaterally.  No warmth or effusion of knee joints noted.  X-rays of both knee joints were obtained today. She was encouraged to continue to use voltaren gel topically prn for pain relief.  Chronic pain of both knees -She presents today with increased pain in bilateral knee  joints.  She has good range of motion with no warmth or effusion on exam today.  She has not had any recent injuries or falls.  She has no mechanical symptoms.  She has been experiencing increased stiffness after sitting for prolonged periods of time.  X-rays of both knee joints were obtained today.  Plan: XR KNEE 3 VIEW RIGHT, XR KNEE 3 VIEW LEFT.  X-ray of bilateral knee joint showed moderate osteoarthritis and severe chondromalacia patella with chondrocalcinosis.  Left knee joint was injected as described above.  DDD (degenerative disc disease), cervical: She has limited range of motion of her C-spine.  She has no symptoms of radiculopathy.  She is trapezius muscle tension and muscle tenderness bilaterally.  She uses a heating pad to help with muscle tension.   DDD (degenerative disc disease), lumbar: Chronic pain.  She has difficulty performing housework and standing for prolonged periods of time due to experiencing increased lower back pain.   Fibromyalgia -She has generalized muscle aches and muscle tenderness. She has generalized hyperalgesia on exam.  She has trapezius muscle tension and tenderness.  She was previously taking tramadol 50 mg 1 to 2 tablets twice daily PRN for pain relief but discontinued and was started on Oxycodone 5 mg po 1 tablet po prn for pain relief.  She was encouraged to stay active and exercise on a regular basis.    Osteopenia of multiple sites: DEXA on 02/18/17 BMD left femoral neck 0.898 and T-score -1.0.    Other medical conditions are listed as follows:   History of sleep apnea  History of COPD  History of CHF (congestive heart failure)  History of glaucoma  History of pulmonary embolism   Orders: Orders Placed This Encounter  Procedures  . XR KNEE 3 VIEW RIGHT  . XR KNEE 3 VIEW LEFT   No orders of the defined types were placed in this encounter.   Face-to-face time spent with patient was 30 minutes. Greater than 50% of time was spent in counseling  and coordination of care.  Follow-Up Instructions: Return in about 6 months (around 08/07/2019) for Pseudgout, Osteoarthritis, DDD.   Hazel Sams PA-C  I examined and evaluated the patient with Hazel Sams PA.  Patient has moderate osteoarthritis in her bilateral knee joints and chondrocalcinosis.  She complains of severe discomfort in her knee joints.  After different treatment options were discussed she wanted to have cortisone injection.  We will inject left knee joint with the cortisone.  The plan of care was discussed as noted above.  Bo Merino, MD Note - This record has been created using Editor, commissioning.  Chart creation errors have been sought, but may not always  have been located. Such creation errors do not reflect on  the standard of medical care.

## 2019-01-27 DIAGNOSIS — J4540 Moderate persistent asthma, uncomplicated: Secondary | ICD-10-CM | POA: Diagnosis not present

## 2019-01-27 DIAGNOSIS — J302 Other seasonal allergic rhinitis: Secondary | ICD-10-CM | POA: Diagnosis not present

## 2019-01-27 DIAGNOSIS — R0602 Shortness of breath: Secondary | ICD-10-CM | POA: Diagnosis not present

## 2019-01-27 DIAGNOSIS — Z87891 Personal history of nicotine dependence: Secondary | ICD-10-CM | POA: Diagnosis not present

## 2019-01-29 DIAGNOSIS — M5412 Radiculopathy, cervical region: Secondary | ICD-10-CM | POA: Diagnosis not present

## 2019-01-29 DIAGNOSIS — G5602 Carpal tunnel syndrome, left upper limb: Secondary | ICD-10-CM | POA: Diagnosis not present

## 2019-01-30 DIAGNOSIS — Z7901 Long term (current) use of anticoagulants: Secondary | ICD-10-CM | POA: Diagnosis not present

## 2019-02-05 ENCOUNTER — Ambulatory Visit (INDEPENDENT_AMBULATORY_CARE_PROVIDER_SITE_OTHER): Payer: Medicare HMO | Admitting: Physician Assistant

## 2019-02-05 ENCOUNTER — Other Ambulatory Visit: Payer: Self-pay

## 2019-02-05 ENCOUNTER — Ambulatory Visit: Payer: Self-pay

## 2019-02-05 ENCOUNTER — Ambulatory Visit (INDEPENDENT_AMBULATORY_CARE_PROVIDER_SITE_OTHER): Payer: Medicare HMO

## 2019-02-05 ENCOUNTER — Encounter: Payer: Self-pay | Admitting: Physician Assistant

## 2019-02-05 VITALS — BP 128/78 | HR 58 | Resp 12 | Ht 61.0 in | Wt 189.8 lb

## 2019-02-05 DIAGNOSIS — Z8669 Personal history of other diseases of the nervous system and sense organs: Secondary | ICD-10-CM

## 2019-02-05 DIAGNOSIS — M25562 Pain in left knee: Secondary | ICD-10-CM

## 2019-02-05 DIAGNOSIS — G8929 Other chronic pain: Secondary | ICD-10-CM

## 2019-02-05 DIAGNOSIS — M5030 Other cervical disc degeneration, unspecified cervical region: Secondary | ICD-10-CM | POA: Diagnosis not present

## 2019-02-05 DIAGNOSIS — M170 Bilateral primary osteoarthritis of knee: Secondary | ICD-10-CM

## 2019-02-05 DIAGNOSIS — M25561 Pain in right knee: Secondary | ICD-10-CM

## 2019-02-05 DIAGNOSIS — M19041 Primary osteoarthritis, right hand: Secondary | ICD-10-CM

## 2019-02-05 DIAGNOSIS — M8589 Other specified disorders of bone density and structure, multiple sites: Secondary | ICD-10-CM | POA: Diagnosis not present

## 2019-02-05 DIAGNOSIS — M1120 Other chondrocalcinosis, unspecified site: Secondary | ICD-10-CM | POA: Diagnosis not present

## 2019-02-05 DIAGNOSIS — M797 Fibromyalgia: Secondary | ICD-10-CM | POA: Diagnosis not present

## 2019-02-05 DIAGNOSIS — M5136 Other intervertebral disc degeneration, lumbar region: Secondary | ICD-10-CM

## 2019-02-05 DIAGNOSIS — Z86711 Personal history of pulmonary embolism: Secondary | ICD-10-CM

## 2019-02-05 DIAGNOSIS — Z8709 Personal history of other diseases of the respiratory system: Secondary | ICD-10-CM

## 2019-02-05 DIAGNOSIS — Z8679 Personal history of other diseases of the circulatory system: Secondary | ICD-10-CM

## 2019-02-05 DIAGNOSIS — M19042 Primary osteoarthritis, left hand: Secondary | ICD-10-CM

## 2019-02-05 MED ORDER — LIDOCAINE HCL 1 % IJ SOLN
1.5000 mL | INTRAMUSCULAR | Status: AC | PRN
  Administered 2019-02-05: 1.5 mL

## 2019-02-05 MED ORDER — TRIAMCINOLONE ACETONIDE 40 MG/ML IJ SUSP
40.0000 mg | INTRAMUSCULAR | Status: AC | PRN
  Administered 2019-02-05: 40 mg via INTRA_ARTICULAR

## 2019-02-05 NOTE — Patient Instructions (Signed)
[facility]  Ask your health care provider which exercises are safe for you. Do exercises exactly as told by your health care provider and adjust them as directed. It is normal to feel mild stretching, pulling, tightness, or discomfort as you do these exercises, but you should stop right away if you feel sudden pain or your pain gets worse. Do not begin these exercises until told by your health care provider.  Stretching and range of motion exercises  These exercises warm up your muscles and joints and improve the movement and flexibility of your elbow. These exercises also help to relieve pain, numbness, and tingling.  Exercise A: Wrist extensor stretch  1. Extend your left / right elbow with your fingers pointing down.  2. Gently pull the palm of your left / right hand toward you until you feel a gentle stretch on the top of your forearm.  3. To increase the stretch, push your left / right hand toward the outer edge or pinkie side of your forearm.  4. Hold this position for __________ seconds.  Repeat __________ times. Complete this exercise __________ times a day.  If directed by your health care provider, repeat this stretch except do it with a bent elbow this time.  Exercise B: Wrist flexor stretch    1. Extend your left / right elbow and turn your palm upward.  2. Gently pull your left / right palm and fingertips back so your wrist extends and your fingers point more toward the ground.  3. You should feel a gentle stretch on the inside of your forearm.  4. Hold this position for __________ seconds.  Repeat __________ times. Complete this exercise __________ times a day.  If directed by your health care provider, repeat this stretch except do it with a bent elbow this time.  Strengthening exercises  These exercises build strength and endurance in your elbow. Endurance is the ability to use your muscles for a long time, even after they get tired.  Exercise C: Wrist extensors    1. Sit with your left /  right forearm palm-down and fully supported on a table or countertop. Your elbow should be resting below the height of your shoulder.  2. Let your left / right wrist extend over the edge of the surface.  3. Loosely hold a __________ weight or a piece of rubber exercise band or tubing in your left / right hand. Slowly curl your left / right hand up toward your forearm. If you are using band or tubing, hold the band or tubing in place with your other hand to provide resistance.  4. Hold this position for __________ seconds.  5. Slowly return to the starting position.  Repeat __________ times. Complete this exercise __________ times a day.  Exercise D: Radial deviators    1. Stand with a __________ weight in your left / righthand. Or, sit while holding a rubber exercise band or tubing with your other arm supported on a table or countertop. Position your hand so your thumb is on top.  2. Raise your hand upward in front of you so your thumb travels toward your forearm, or pull up on the rubber tubing.  3. Hold this position for __________ seconds.  4. Slowly return to the starting position.  Repeat __________ times. Complete this exercise __________ times a day.  Exercise E: Eccentric wrist extensors  1. Sit with your left / right forearm palm-down and fully supported on a table or countertop. Your   elbow should be resting below the height of your shoulder.  2. If told by your health care provider, hold a __________ weight in your hand.  3. Let your left / right wrist extend over the edge of the surface.  4. Use your other hand to lift up your left / right hand toward your forearm. Keep your forearm on the table.  5. Using only the muscles in your left / right hand, slowly lower your hand back down to the starting position.  Repeat __________ times. Complete this exercise __________ times a day.  This information is not intended to replace advice given to you by your health care provider. Make sure you discuss any  questions you have with your health care provider.  Document Released: 08/20/2005 Document Revised: 04/25/2016 Document Reviewed: 05/19/2015  [employer]vier Interactive Patient Education  2019 [employer]

## 2019-02-06 ENCOUNTER — Other Ambulatory Visit: Payer: Self-pay | Admitting: Rheumatology

## 2019-02-06 NOTE — Telephone Encounter (Signed)
Last Visit: 02/05/19 Next Visit: 08/06/19 Labs: 06/26/18 WNL  Okay to refill per Dr. Estanislado Pandy

## 2019-02-10 DIAGNOSIS — M79669 Pain in unspecified lower leg: Secondary | ICD-10-CM | POA: Diagnosis not present

## 2019-02-10 DIAGNOSIS — M109 Gout, unspecified: Secondary | ICD-10-CM | POA: Diagnosis not present

## 2019-02-10 DIAGNOSIS — Z6836 Body mass index (BMI) 36.0-36.9, adult: Secondary | ICD-10-CM | POA: Diagnosis not present

## 2019-02-11 ENCOUNTER — Other Ambulatory Visit (HOSPITAL_COMMUNITY): Payer: Self-pay | Admitting: Neurology

## 2019-02-11 DIAGNOSIS — M545 Low back pain, unspecified: Secondary | ICD-10-CM

## 2019-02-11 DIAGNOSIS — M9901 Segmental and somatic dysfunction of cervical region: Secondary | ICD-10-CM | POA: Diagnosis not present

## 2019-02-11 DIAGNOSIS — M546 Pain in thoracic spine: Secondary | ICD-10-CM | POA: Diagnosis not present

## 2019-02-11 DIAGNOSIS — M542 Cervicalgia: Secondary | ICD-10-CM | POA: Diagnosis not present

## 2019-02-11 DIAGNOSIS — I10 Essential (primary) hypertension: Secondary | ICD-10-CM | POA: Diagnosis not present

## 2019-02-11 DIAGNOSIS — M9902 Segmental and somatic dysfunction of thoracic region: Secondary | ICD-10-CM | POA: Diagnosis not present

## 2019-02-11 DIAGNOSIS — M9903 Segmental and somatic dysfunction of lumbar region: Secondary | ICD-10-CM | POA: Diagnosis not present

## 2019-02-20 DIAGNOSIS — Z7901 Long term (current) use of anticoagulants: Secondary | ICD-10-CM | POA: Diagnosis not present

## 2019-02-23 ENCOUNTER — Other Ambulatory Visit (HOSPITAL_COMMUNITY): Payer: Self-pay | Admitting: Neurology

## 2019-02-23 ENCOUNTER — Ambulatory Visit (HOSPITAL_COMMUNITY)
Admission: RE | Admit: 2019-02-23 | Discharge: 2019-02-23 | Disposition: A | Discharge status: Home / Self Care | Payer: Medicare HMO | Source: Ambulatory Visit | Attending: Neurology | Admitting: Neurology

## 2019-02-23 ENCOUNTER — Other Ambulatory Visit: Payer: Self-pay

## 2019-02-23 DIAGNOSIS — M545 Low back pain, unspecified: Secondary | ICD-10-CM

## 2019-02-23 DIAGNOSIS — M5136 Other intervertebral disc degeneration, lumbar region: Secondary | ICD-10-CM | POA: Diagnosis not present

## 2019-02-23 DIAGNOSIS — M47812 Spondylosis without myelopathy or radiculopathy, cervical region: Secondary | ICD-10-CM | POA: Diagnosis not present

## 2019-02-23 DIAGNOSIS — M50321 Other cervical disc degeneration at C4-C5 level: Secondary | ICD-10-CM | POA: Diagnosis not present

## 2019-02-23 DIAGNOSIS — M542 Cervicalgia: Secondary | ICD-10-CM | POA: Diagnosis not present

## 2019-02-23 DIAGNOSIS — M5116 Intervertebral disc disorders with radiculopathy, lumbar region: Secondary | ICD-10-CM | POA: Diagnosis not present

## 2019-02-23 DIAGNOSIS — M4312 Spondylolisthesis, cervical region: Secondary | ICD-10-CM | POA: Diagnosis not present

## 2019-02-26 ENCOUNTER — Telehealth: Payer: Self-pay | Admitting: Cardiovascular Disease

## 2019-02-26 NOTE — Telephone Encounter (Signed)
Virtual Visit Pre-Appointment Phone Call  "(Name), I am calling you today to discuss your upcoming appointment. We are currently trying to limit exposure to the virus that causes COVID-19 by seeing patients at home rather than in the office."  1. "What is the BEST phone number to call the day of the visit?" - include this in appointment notes  2. Do you have or have access to (through a family member/friend) a smartphone with video capability that we can use for your visit?" a. If yes - list this number in appt notes as cell (if different from BEST phone #) and list the appointment type as a VIDEO visit in appointment notes b. If no - list the appointment type as a PHONE visit in appointment notes  3. Confirm consent - "In the setting of the current Covid19 crisis, you are scheduled for a (phone or video) visit with your provider on (date) at (time).  Just as we do with many in-office visits, in order for you to participate in this visit, we must obtain consent.  If you'd like, I can send this to your mychart (if signed up) or email for you to review.  Otherwise, I can obtain your verbal consent now.  All virtual visits are billed to your insurance company just like a normal visit would be.  By agreeing to a virtual visit, we'd like you to understand that the technology does not allow for your provider to perform an examination, and thus may limit your provider's ability to fully assess your condition. If your provider identifies any concerns that need to be evaluated in person, we will make arrangements to do so.  Finally, though the technology is pretty good, we cannot assure that it will always work on either your or our end, and in the setting of a video visit, we may have to convert it to a phone-only visit.  In either situation, we cannot ensure that we have a secure connection.  Are you willing to proceed?" STAFF: Dide patient verbally acknowledge consent to telehealth visit? Document  YES/NO here: yes  4. Advise patient to be prepared - "Two hours prior to your appointment, go ahead and check your blood pressure, pulse, oxygen saturation, and your weight (if you have the equipment to check those) and write them all down. When your visit starts, your provider will ask you for this information. If you have an Apple Watch or Kardia device, please plan to have heart rate information ready on the day of your appointment. Please have a pen and paper handy nearby the day of the visit as well."  5. Give patient instructions for MyChart download to smartphone OR Doximity/[email or web address] as below if video visit (depending on what platform provider is using)  6. Inform patient they will receive a phone call 15 minutes prior to their appointment time (may be from unknown caller ID) so they should be prepared to answer    TELEPHONE CALL NOTE  Dorothy Ford has been deemed a candidate for a follow-up tele-health visit to limit community exposure during the Covid-19 pandemic. I spoke with the patient via phone to ensure availability of phone/video source, confirm preferred email & phone number, and discuss instructions and expectations.  I reminded Dorothy Ford to be prepared with any vital sign and/or heart rhythm information that could potentially be obtained via home monitoring, at the time of her visit. I reminded Dorothy Ford to expect a phone call prior to her  visit.  [name] 02/26/2019 1:52 PM   INSTRUCTIONS FOR DOWNLOADING THE MYCHART APP TO SMARTPHONE  - The patient must first make sure to have activated MyChart and know their login information - If Apple, go to [employer] and type in MyChart in the search bar and download the app. If Android, ask patient to go to [employer] and type in [place] in the search bar and download the app. The app is free but as with any other app downloads, their phone may require them to verify saved payment information or  Apple/Android password.  - The patient will need to then log into the app with their MyChart username and password, and select [facility] as their healthcare provider to link the account. When it is time for your visit, go to the MyChart app, find appointments, and click Begin Video Visit. Be sure to Select Allow for your device to access the Microphone and Camera for your visit. You will then be connected, and your provider will be with you shortly.  **If they have any issues connecting, or need assistance please contact MyChart service desk [phone])**  **If using a computer, in order to ensure the best quality for their visit they will need to use either of the following Internet Browsers: [employer], or Google Chrome**  IF USING DOXIMITY or DOXY.ME - The patient will receive a link just prior to their visit by text.     FULL LENGTH CONSENT FOR TELE-HEALTH VISIT   I hereby voluntarily request, consent and authorize [facility] and its employed or contracted physicians, physician assistants, nurse practitioners or other licensed health care professionals (the Practitioner), to provide me with telemedicine health care services (the Services") as deemed necessary by the treating Practitioner. I acknowledge and consent to receive the Services by the Practitioner via telemedicine. I understand that the telemedicine visit will involve communicating with the Practitioner through live audiovisual communication technology and the disclosure of certain medical information by electronic transmission. I acknowledge that I have been given the opportunity to request an in-person assessment or other available alternative prior to the telemedicine visit and am voluntarily participating in the telemedicine visit.  I understand that I have the right to withhold or withdraw my consent to the use of telemedicine in the course of my care at any time, without affecting my right to future care  or treatment, and that the Practitioner or I may terminate the telemedicine visit at any time. I understand that I have the right to inspect all information obtained and/or recorded in the course of the telemedicine visit and may receive copies of available information for a reasonable fee.  I understand that some of the potential risks of receiving the Services via telemedicine include:   Delay or interruption in medical evaluation due to technological equipment failure or disruption;  Information transmitted may not be sufficient (e.g. poor resolution of images) to allow for appropriate medical decision making by the Practitioner; and/or   In rare instances, security protocols could fail, causing a breach of personal health information.  Furthermore, I acknowledge that it is my responsibility to provide information about my medical history, conditions and care that is complete and accurate to the best of my ability. I acknowledge that Practitioner's advice, recommendations, and/or decision may be based on factors not within their control, such as incomplete or inaccurate data provided by me or distortions of diagnostic images or specimens that may result from electronic transmissions. I understand  that the practice of medicine is not an exact science and that Practitioner makes no warranties or guarantees regarding treatment outcomes. I acknowledge that I will receive a copy of this consent concurrently upon execution via email to the email address I last provided but may also request a printed copy by calling the office of [facility].    I understand that my insurance will be billed for this visit.   I have read or had this consent read to me.  I understand the contents of this consent, which adequately explains the benefits and risks of the Services being provided via telemedicine.   I have been provided ample opportunity to ask questions regarding this consent and the Services and have had  my questions answered to my satisfaction.  I give my informed consent for the services to be provided through the use of telemedicine in my medical care  By participating in this telemedicine visit I agree to the above.

## 2019-03-02 DIAGNOSIS — I5032 Chronic diastolic (congestive) heart failure: Secondary | ICD-10-CM | POA: Diagnosis not present

## 2019-03-02 DIAGNOSIS — J4541 Moderate persistent asthma with (acute) exacerbation: Secondary | ICD-10-CM | POA: Diagnosis not present

## 2019-03-02 DIAGNOSIS — I10 Essential (primary) hypertension: Secondary | ICD-10-CM | POA: Diagnosis not present

## 2019-03-02 DIAGNOSIS — M797 Fibromyalgia: Secondary | ICD-10-CM | POA: Diagnosis not present

## 2019-03-05 DIAGNOSIS — M25512 Pain in left shoulder: Secondary | ICD-10-CM | POA: Diagnosis not present

## 2019-03-05 DIAGNOSIS — M797 Fibromyalgia: Secondary | ICD-10-CM | POA: Diagnosis not present

## 2019-03-05 DIAGNOSIS — M5010 Cervical disc disorder with radiculopathy, unspecified cervical region: Secondary | ICD-10-CM | POA: Diagnosis not present

## 2019-03-05 DIAGNOSIS — M545 Low back pain: Secondary | ICD-10-CM | POA: Diagnosis not present

## 2019-03-12 ENCOUNTER — Telehealth: Payer: Medicare HMO | Admitting: Cardiovascular Disease

## 2019-03-16 ENCOUNTER — Other Ambulatory Visit: Payer: Self-pay | Admitting: Rheumatology

## 2019-03-19 ENCOUNTER — Telehealth: Payer: Self-pay | Admitting: Rheumatology

## 2019-03-19 DIAGNOSIS — Z79899 Other long term (current) drug therapy: Secondary | ICD-10-CM

## 2019-03-19 DIAGNOSIS — M1120 Other chondrocalcinosis, unspecified site: Secondary | ICD-10-CM

## 2019-03-19 NOTE — Telephone Encounter (Signed)
Patient advised she is due for labs. Patient will come next week to update.

## 2019-03-19 NOTE — Telephone Encounter (Signed)
Patient called stating she received a call from [facility] stating Dr. Estanislado Pandy did not approve her refill request.  Patient requested a return call.

## 2019-03-20 DIAGNOSIS — Z7901 Long term (current) use of anticoagulants: Secondary | ICD-10-CM | POA: Diagnosis not present

## 2019-03-23 DIAGNOSIS — M545 Low back pain: Secondary | ICD-10-CM | POA: Diagnosis not present

## 2019-03-23 DIAGNOSIS — Z6836 Body mass index (BMI) 36.0-36.9, adult: Secondary | ICD-10-CM | POA: Diagnosis not present

## 2019-03-27 ENCOUNTER — Ambulatory Visit (INDEPENDENT_AMBULATORY_CARE_PROVIDER_SITE_OTHER): Payer: Medicare HMO | Admitting: Cardiology

## 2019-03-27 ENCOUNTER — Encounter: Payer: Self-pay | Admitting: Cardiology

## 2019-03-27 ENCOUNTER — Encounter: Payer: Self-pay | Admitting: *Deleted

## 2019-03-27 ENCOUNTER — Other Ambulatory Visit: Payer: Self-pay

## 2019-03-27 VITALS — BP 116/72 | HR 58 | Temp 98.1°F | Ht 61.0 in | Wt 192.0 lb

## 2019-03-27 DIAGNOSIS — I10 Essential (primary) hypertension: Secondary | ICD-10-CM

## 2019-03-27 DIAGNOSIS — I5032 Chronic diastolic (congestive) heart failure: Secondary | ICD-10-CM | POA: Diagnosis not present

## 2019-03-27 DIAGNOSIS — I471 Supraventricular tachycardia: Secondary | ICD-10-CM | POA: Diagnosis not present

## 2019-03-27 DIAGNOSIS — I2782 Chronic pulmonary embolism: Secondary | ICD-10-CM

## 2019-03-27 DIAGNOSIS — E785 Hyperlipidemia, unspecified: Secondary | ICD-10-CM | POA: Diagnosis not present

## 2019-03-27 NOTE — Progress Notes (Signed)
Cardiology Office Note:    Date:  03/27/2019   ID:  Dorothy Ford, DOB [birth year], MRN [id number]  PCP:  Neale Burly, MD  Cardiologist:  Kate Sable, MD  Referring MD: Neale Burly, MD   Chief Complaint  Patient presents with   Follow-up    dCHF, SVT    History of Present Illness:    Dorothy Ford is a 68 y.o. female with a past medical history significant for chronic diastolic CHF, COPD, prior PE (on Coumadin for anticoagulation), PSVT (s/p ablation 1998), hypertension and fibromyalgia.   She was last seen in the office on 08/12/2018 with complaints of lower extremity edema.  It was noted that she had had an episode of tachycardia with heart rate ranging from the 130s to 150s which improved with Lopressor.  Reportedly it was sinus tachycardia but records were not available to confirm.  She had reported that she underwent lower extremity Dopplers negative for DVT.  At the office visit she only had trace edema along her ankles.  She was also noted to have varicose veins possibly contributing to her symptoms.  Compression stockings were recommended but the patient reported being unable to tolerate them in the past.  She was advised to elevate her lower extremities.  The patient was on Lasix 40 mg daily.  BNP was checked and that not elevated.  Potassium was low at 3.2.  Her K-Dur was increased to 30 mEq daily.   She had low back pain last week and started on prednisone and muscle relaxers. Pain is better but still bothering her. She says she is full of arthritis. She is follow by rheumatologist, Dr. Patrecia Pour. She also has gout in her left knee. [place].   She has shortness of breath that she relates to pulmonary issues. She uses inhalers daily and nebulizer as needed. She denies chest pain/pressure. She has left arm sharp pain and bone spur in left shoulder. She was just evaluated by neurology with nerve conduction test on left  arm that showed minor carpel tunnel per pt.   She has mild ankle puffiness. No pitting edema.    Past Medical History:  Diagnosis Date   Acid reflux    Asthma    Cardiac dysrhythmia, unspecified    Carotid bruit    Bilateral   Cataract    Chest pain, unspecified    Chronic airway obstruction, not elsewhere classified    Chronic sinusitis    Congestive heart failure, unspecified    DDD (degenerative disc disease), cervical 07/24/2016   DDD (degenerative disc disease), lumbar 07/24/2016   Depressive disorder, not elsewhere classified    Diaphragmatic hernia without mention of obstruction or gangrene    Edema    left leg   Fibromyalgia    High cholesterol    History of rheumatoid arthritis    Obesity, unspecified    Obstructive lung disease (generalized) ([place])    Osteoarthritis of both hands 07/24/2016   Osteoporosis 07/24/2016   Palpitations    Pseudogout involving multiple joints    SVT (supraventricular tachycardia) ([place])    Syncope    admitted 09/2011   Thyroid disease    Unspecified essential hypertension     Past Surgical History:  Procedure Laterality Date   CARPAL TUNNEL RELEASE     CATHETER ABLATION  02/1997   @ [facility]   VEIN SURGERY BLADDER      Current Medications: Current Meds  Medication Sig  albuterol (PROVENTIL HFA;VENTOLIN HFA) 108 (90 Base) MCG/ACT inhaler Inhale into the lungs every 6 (six) hours as needed for wheezing or shortness of breath.   amLODipine (NORVASC) 5 MG tablet Take 5 mg by mouth daily.   atorvastatin (LIPITOR) 40 MG tablet Take 40 mg by mouth daily.   azelastine (ASTELIN) 0.1 % nasal spray Place 1 spray into both nostrils daily.   cetirizine (ZYRTEC) 10 MG tablet Take 10 mg by mouth daily.   clonazePAM (KLONOPIN) 0.5 MG tablet Take 0.5 mg by mouth at bedtime.    clotrimazole-betamethasone (LOTRISONE) cream as needed.    COLCRYS 0.6 MG tablet TAKE 1 TABLET BY MOUTH ONCE DAILY.    cycloSPORINE (RESTASIS) 0.05 % ophthalmic emulsion Place 1 drop into both eyes 2 (two) times daily.   diclofenac (VOLTAREN) 75 MG EC tablet Take 75 mg by mouth 2 (two) times daily.    DULoxetine (CYMBALTA) 60 MG capsule Take 60 mg by mouth daily.    fluticasone (FLONASE) 50 MCG/ACT nasal spray Place 1 spray into the nose daily.    furosemide (LASIX) 40 MG tablet TAKE 1 TABLET ONCE DAILY.   gabapentin (NEURONTIN) 300 MG capsule Take 300 mg by mouth 3 (three) times daily.    guaiFENesin (MUCINEX) 600 MG 12 hr tablet Take 600 mg by mouth 2 (two) times daily as needed.   hydrOXYzine (VISTARIL) 25 MG capsule Take 25 mg by mouth 3 (three) times daily as needed.    ipratropium (ATROVENT) 0.06 % nasal spray Place 1 spray into the nose daily.   ipratropium-albuterol (DUONEB) 0.5-2.5 (3) MG/3ML SOLN Take 3 mLs by nebulization every 6 (six) hours as needed.    levalbuterol (XOPENEX HFA) 45 MCG/ACT inhaler Inhale 2 puffs into the lungs every 6 (six) hours as needed for wheezing.   levocetirizine (XYZAL) 5 MG tablet Take 5 mg by mouth daily.   levothyroxine (SYNTHROID, LEVOTHROID) 50 MCG tablet Take 50 mcg by mouth daily before breakfast.    loperamide (IMODIUM A-D) 2 MG tablet Take 2 mg by mouth 4 (four) times daily as needed for diarrhea or loose stools.   methocarbamol (ROBAXIN) 500 MG tablet Take 500 mg by mouth as needed.    metoprolol succinate (TOPROL-XL) 25 MG 24 hr tablet Take 1 tablet (25 mg total) by mouth daily.   montelukast (SINGULAIR) 10 MG tablet Take 1 tablet by mouth daily.   nystatin (MYCOSTATIN) powder Apply topically as needed.   ondansetron (ZOFRAN) 4 MG tablet as needed.    oxyCODONE (OXY IR/ROXICODONE) 5 MG immediate release tablet as needed.    pantoprazole (PROTONIX) 40 MG tablet Take 40 mg by mouth daily.   PARoxetine (PAXIL) 30 MG tablet Take 30 mg by mouth daily.   potassium chloride (K-DUR) 10 MEQ tablet Take 3 tablets (30 mEq total) by mouth daily.    predniSONE (DELTASONE) 10 MG tablet Take 10 mg by mouth. Taper dose for back.   ranitidine (ZANTAC) 300 MG tablet Take 150 mg by mouth at bedtime.    Sennosides (SENOKOT PO) Take by mouth.   Simethicone (GAS-X EXTRA STRENGTH) 125 MG CAPS Take 2 capsules by mouth as needed.   SYMBICORT 160-4.5 MCG/ACT inhaler Inhale 2 puffs into the lungs 2 (two) times daily.   tiZANidine (ZANAFLEX) 4 MG tablet Take 4 mg by mouth as needed.   VOLTAREN 1 % GEL Apply 1 g topically 2 (two) times daily.   warfarin (COUMADIN) 5 MG tablet Take 5 mg by mouth daily. Managed by [facility] office  2.5mg  Monday thru Friday. 5 mg on Saturday and Sunday.     Allergies:   Adhesive [tape] and Codeine   Social History   Socioeconomic History   Marital status: Single    Spouse name: Not on file   Number of children: Not on file   Years of education: Not on file   Highest education level: Not on file  Occupational History   Not on file  Social Needs   Financial resource strain: Not on file   Food insecurity    Worry: Not on file    Inability: Not on file   Transportation needs    Medical: Not on file    Non-medical: Not on file  Tobacco Use   Smoking status: Never Smoker   Smokeless tobacco: Never Used  Substance and Sexual Activity   Alcohol use: No    Alcohol/week: 0.0 standard drinks   Drug use: Never   Sexual activity: Not on file  Lifestyle   Physical activity    Days per week: Not on file    Minutes per session: Not on file   Stress: Not on file  Relationships   Social connections    Talks on phone: Not on file    Gets together: Not on file    Attends religious service: Not on file    Active member of club or organization: Not on file    Attends meetings of clubs or organizations: Not on file    Relationship status: Not on file  Other Topics Concern   Not on file  Social History Narrative   Not on file     Family History: The patient's family history includes Asthma  in her sister; Heart disease in her father and mother; Hypertension in her father and mother; Kidney failure in her sister; Stroke in an other family member. ROS:   Please see the history of present illness.     All other systems reviewed and are negative.  EKGs/Labs/Other Studies Reviewed:    The following studies were reviewed today:   Echocardiogram: 09/2011   NST: 10/2016  There was no ST segment deviation noted during stress.  The study is normal. There are no myocardial perfusion defects  This is a low risk study.  The left ventricular ejection fraction is hyperdynamic (>65%).  Lower Extremity Dopplers: 03/2018 IMPRESSION: No evidence of right lower extremity deep venous thrombosis.   EKG:  EKG is ordered today.  The ekg ordered today demonstrates sinus bradycardia, 55 bpm, No ST/T changes.   Recent Labs: 06/26/2018: ALT 18; Hemoglobin 12.0; Platelets 317 08/12/2018: B Natriuretic Peptide 22.0; BUN 7; Creatinine, Ser 0.97; Potassium 3.2; Sodium 139   Recent Lipid Panel No results found for: CHOL, TRIG, HDL, CHOLHDL, VLDL, LDLCALC, LDLDIRECT  Physical Exam:    VS:  BP 116/72    Pulse (!) 58    Temp 98.1 F (36.7 C)    Ht 5\' 1"  (1.549 m)    Wt 192 lb (87.1 kg)    SpO2 99%    BMI 36.28 kg/m     Wt Readings from Last 3 Encounters:  03/27/19 192 lb (87.1 kg)  02/05/19 189 lb 12.8 oz (86.1 kg)  08/12/18 185 lb 6.4 oz (84.1 kg)     Physical Exam  Constitutional: She is oriented to person, place, and time. She appears well-developed and well-nourished. No distress.  HENT:  Head: Normocephalic and atraumatic.  Wearing left hearing aid.   Neck: Normal range of motion. Neck supple.  No JVD present.  Cardiovascular: Normal rate, regular rhythm, normal heart sounds and intact distal pulses. Exam reveals no gallop and no friction rub.  No murmur heard. Pulmonary/Chest: Effort normal and breath sounds normal. No respiratory distress. She has no wheezes. She has no  rales.  Abdominal: Soft. Bowel sounds are normal.  Musculoskeletal:     Comments: Trace bil ankle puffiness. Wearing OTC compression socks.   Neurological: She is alert and oriented to person, place, and time.  Skin: Skin is warm and dry.  Psychiatric: She has a normal mood and affect. Her behavior is normal. Judgment and thought content normal.  Vitals reviewed.    ASSESSMENT:    1. Chronic diastolic heart failure ([place])   2. PSVT (paroxysmal supraventricular tachycardia) ([place])   3. Essential (primary) hypertension   4. Other chronic pulmonary embolism without acute cor pulmonale (HCC)   5. Hyperlipidemia, unspecified hyperlipidemia type    PLAN:    In order of problems listed above:  Chronic diastolic CHF -On Lasix 40 mg daily -Potassium was low at her last visit in December.  K-Dur was increased. Labs in February showed K+ 3.3. Will recheck BMet today.  -Only mild puffiness of ankles. No pitting edema. No orthopnea. She has asthma that causes some mild dyspnea.  -Continue current therapy.  Will increase K. Dur if potassium is still low.  Paroxysmal SVT -Patient had reported sinus tachycardia around the time she fractured her finger.  Remains on Toprol-XL 25 mg daily. 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  -Followed by pulmonogy. Uses maintenance inhalers and as needed bronchodilators.   Hypertension: On amlodipine 5 mg, Toprol-XL 25 mg and Lasix 40 mg. BP well controlled.   History of PE -Patient is on chronic Coumadin.  No unusual bleeding.  INR is followed by her PCP. She has had some bleeding issues when her INR gets high, but she reports that it is currently stable.   Hyperlipidemia: On atorvastatin 40 mg daily.  Followed by her PCP   Medication Adjustments/Labs and Tests Ordered: Current medicines are reviewed at length with the patient today.  Concerns regarding medicines are outlined above.  Labs and tests ordered and medication changes are outlined in the patient instructions below:  Patient Instructions  Medication Instructions:   Your physician recommends that you continue on your current medications as directed. Please refer to the Current Medication list given to you today.  Labwork:  Your physician recommends that you return for lab work in: as soon as possible to check your BMET. You may have this done at [facility] Please take the lab order given to you.   Testing/Procedures:  NONE  Follow-Up:  Your physician recommends that you schedule a follow-up appointment in: 6 months with Dr. Bronson Ing. You will receive a reminder letter in the mail in about 4 months reminding you to call and schedule your appointment. If you don't receive this letter, please contact our office.  Any Other Special Instructions Will Be Listed Below (If Applicable).  If you need a refill on your cardiac medications before your next appointment, please call your pharmacy.  Lifestyle Modifications to Prevent and Treat Heart Disease -Recommend heart healthy/Mediterranean diet, with whole grains, fruits, vegetable, fish, lean meats, nuts, and olive oil.  -Limit salt. -Recommend moderate walking, 3-5 times/week for 30-50 minutes each session. Aim for at least 150 minutes.week. Goal should be pace of 3 miles/hours, or  walking 1.5 miles in 30 minutes -Recommend avoidance of tobacco products. Avoid excess alcohol. -Keep blood pressure well controlled, ideally less than 130/80.      Signed, Dorothy Perch, NP  03/27/2019 3:10 PM    [facility]

## 2019-03-27 NOTE — Patient Instructions (Addendum)
Medication Instructions:   Your physician recommends that you continue on your current medications as directed. Please refer to the Current Medication list given to you today.  Labwork:  Your physician recommends that you return for lab work in: as soon as possible to check your BMET. You may have this done at [facility]. Please take the lab order given to you.   Testing/Procedures:  NONE  Follow-Up:  Your physician recommends that you schedule a follow-up appointment in: 6 months with Dr. Bronson Ing. You will receive a reminder letter in the mail in about 4 months reminding you to call and schedule your appointment. If you don't receive this letter, please contact our office.  Any Other Special Instructions Will Be Listed Below (If Applicable).  If you need a refill on your cardiac medications before your next appointment, please call your pharmacy.  Lifestyle Modifications to Prevent and Treat Heart Disease -Recommend heart healthy/Mediterranean diet, with whole grains, fruits, vegetable, fish, lean meats, nuts, and olive oil.  -Limit salt. -Recommend moderate walking, 3-5 times/week for 30-50 minutes each session. Aim for at least 150 minutes.week. Goal should be pace of 3 miles/hours, or walking 1.5 miles in 30 minutes -Recommend avoidance of tobacco products. Avoid excess alcohol. -Keep blood pressure well controlled, ideally less than 130/80.

## 2019-04-01 DIAGNOSIS — M542 Cervicalgia: Secondary | ICD-10-CM | POA: Diagnosis not present

## 2019-04-01 DIAGNOSIS — M9902 Segmental and somatic dysfunction of thoracic region: Secondary | ICD-10-CM | POA: Diagnosis not present

## 2019-04-01 DIAGNOSIS — M9903 Segmental and somatic dysfunction of lumbar region: Secondary | ICD-10-CM | POA: Diagnosis not present

## 2019-04-01 DIAGNOSIS — I10 Essential (primary) hypertension: Secondary | ICD-10-CM | POA: Diagnosis not present

## 2019-04-01 DIAGNOSIS — M546 Pain in thoracic spine: Secondary | ICD-10-CM | POA: Diagnosis not present

## 2019-04-01 DIAGNOSIS — M9901 Segmental and somatic dysfunction of cervical region: Secondary | ICD-10-CM | POA: Diagnosis not present

## 2019-04-01 DIAGNOSIS — M545 Low back pain: Secondary | ICD-10-CM | POA: Diagnosis not present

## 2019-04-03 DIAGNOSIS — I471 Supraventricular tachycardia: Secondary | ICD-10-CM | POA: Diagnosis not present

## 2019-04-03 DIAGNOSIS — I110 Hypertensive heart disease with heart failure: Secondary | ICD-10-CM | POA: Diagnosis not present

## 2019-04-03 DIAGNOSIS — I5032 Chronic diastolic (congestive) heart failure: Secondary | ICD-10-CM | POA: Diagnosis not present

## 2019-04-06 ENCOUNTER — Telehealth: Payer: Self-pay | Admitting: *Deleted

## 2019-04-06 DIAGNOSIS — G4730 Sleep apnea, unspecified: Secondary | ICD-10-CM | POA: Diagnosis not present

## 2019-04-06 DIAGNOSIS — Z9989 Dependence on other enabling machines and devices: Secondary | ICD-10-CM | POA: Diagnosis not present

## 2019-04-06 DIAGNOSIS — J449 Chronic obstructive pulmonary disease, unspecified: Secondary | ICD-10-CM | POA: Diagnosis not present

## 2019-04-06 DIAGNOSIS — E039 Hypothyroidism, unspecified: Secondary | ICD-10-CM | POA: Diagnosis not present

## 2019-04-06 DIAGNOSIS — W19XXXA Unspecified fall, initial encounter: Secondary | ICD-10-CM | POA: Diagnosis not present

## 2019-04-06 DIAGNOSIS — I10 Essential (primary) hypertension: Secondary | ICD-10-CM | POA: Diagnosis not present

## 2019-04-06 DIAGNOSIS — I509 Heart failure, unspecified: Secondary | ICD-10-CM | POA: Diagnosis not present

## 2019-04-06 DIAGNOSIS — M79672 Pain in left foot: Secondary | ICD-10-CM | POA: Diagnosis not present

## 2019-04-06 DIAGNOSIS — M797 Fibromyalgia: Secondary | ICD-10-CM | POA: Diagnosis not present

## 2019-04-06 DIAGNOSIS — Z885 Allergy status to narcotic agent status: Secondary | ICD-10-CM | POA: Diagnosis not present

## 2019-04-06 DIAGNOSIS — Z79899 Other long term (current) drug therapy: Secondary | ICD-10-CM | POA: Diagnosis not present

## 2019-04-06 DIAGNOSIS — R52 Pain, unspecified: Secondary | ICD-10-CM | POA: Diagnosis not present

## 2019-04-06 DIAGNOSIS — M19072 Primary osteoarthritis, left ankle and foot: Secondary | ICD-10-CM | POA: Diagnosis not present

## 2019-04-06 DIAGNOSIS — Z209 Contact with and (suspected) exposure to unspecified communicable disease: Secondary | ICD-10-CM | POA: Diagnosis not present

## 2019-04-06 NOTE — Telephone Encounter (Signed)
LM to return call.

## 2019-04-06 NOTE — Telephone Encounter (Signed)
-----   Message from Daune Perch, NP sent at 04/06/2019  9:07 AM EDT ----- Kidney function and potassium are normal. No changes.   Daune Perch, NP

## 2019-04-08 ENCOUNTER — Encounter: Payer: Self-pay | Admitting: *Deleted

## 2019-04-08 DIAGNOSIS — Z6836 Body mass index (BMI) 36.0-36.9, adult: Secondary | ICD-10-CM | POA: Diagnosis not present

## 2019-04-08 DIAGNOSIS — M79672 Pain in left foot: Secondary | ICD-10-CM | POA: Diagnosis not present

## 2019-04-10 DIAGNOSIS — I110 Hypertensive heart disease with heart failure: Secondary | ICD-10-CM | POA: Diagnosis not present

## 2019-04-10 DIAGNOSIS — Z79899 Other long term (current) drug therapy: Secondary | ICD-10-CM | POA: Diagnosis not present

## 2019-04-10 DIAGNOSIS — S00412A Abrasion of left ear, initial encounter: Secondary | ICD-10-CM | POA: Diagnosis not present

## 2019-04-10 DIAGNOSIS — H9222 Otorrhagia, left ear: Secondary | ICD-10-CM | POA: Diagnosis not present

## 2019-04-10 DIAGNOSIS — J449 Chronic obstructive pulmonary disease, unspecified: Secondary | ICD-10-CM | POA: Diagnosis not present

## 2019-04-10 DIAGNOSIS — I509 Heart failure, unspecified: Secondary | ICD-10-CM | POA: Diagnosis not present

## 2019-04-10 DIAGNOSIS — Z7901 Long term (current) use of anticoagulants: Secondary | ICD-10-CM | POA: Diagnosis not present

## 2019-04-10 DIAGNOSIS — Z86711 Personal history of pulmonary embolism: Secondary | ICD-10-CM | POA: Diagnosis not present

## 2019-04-10 DIAGNOSIS — X58XXXA Exposure to other specified factors, initial encounter: Secondary | ICD-10-CM | POA: Diagnosis not present

## 2019-04-10 DIAGNOSIS — E039 Hypothyroidism, unspecified: Secondary | ICD-10-CM | POA: Diagnosis not present

## 2019-04-10 NOTE — Telephone Encounter (Signed)
Patient notified

## 2019-04-13 ENCOUNTER — Other Ambulatory Visit: Payer: Self-pay | Admitting: Cardiovascular Disease

## 2019-04-14 DIAGNOSIS — R0789 Other chest pain: Secondary | ICD-10-CM | POA: Diagnosis not present

## 2019-04-14 DIAGNOSIS — W19XXXA Unspecified fall, initial encounter: Secondary | ICD-10-CM | POA: Diagnosis not present

## 2019-04-14 DIAGNOSIS — I10 Essential (primary) hypertension: Secondary | ICD-10-CM | POA: Diagnosis not present

## 2019-04-14 DIAGNOSIS — Z209 Contact with and (suspected) exposure to unspecified communicable disease: Secondary | ICD-10-CM | POA: Diagnosis not present

## 2019-04-14 DIAGNOSIS — Z7901 Long term (current) use of anticoagulants: Secondary | ICD-10-CM | POA: Diagnosis not present

## 2019-04-14 DIAGNOSIS — I4891 Unspecified atrial fibrillation: Secondary | ICD-10-CM | POA: Diagnosis not present

## 2019-04-14 DIAGNOSIS — S8991XA Unspecified injury of right lower leg, initial encounter: Secondary | ICD-10-CM | POA: Diagnosis not present

## 2019-04-14 DIAGNOSIS — R51 Headache: Secondary | ICD-10-CM | POA: Diagnosis not present

## 2019-04-14 DIAGNOSIS — S299XXA Unspecified injury of thorax, initial encounter: Secondary | ICD-10-CM | POA: Diagnosis not present

## 2019-04-14 DIAGNOSIS — M25561 Pain in right knee: Secondary | ICD-10-CM | POA: Diagnosis not present

## 2019-04-14 DIAGNOSIS — R079 Chest pain, unspecified: Secondary | ICD-10-CM | POA: Diagnosis not present

## 2019-04-14 DIAGNOSIS — T07XXXA Unspecified multiple injuries, initial encounter: Secondary | ICD-10-CM | POA: Diagnosis not present

## 2019-04-14 DIAGNOSIS — I509 Heart failure, unspecified: Secondary | ICD-10-CM | POA: Diagnosis not present

## 2019-04-14 DIAGNOSIS — S0990XA Unspecified injury of head, initial encounter: Secondary | ICD-10-CM | POA: Diagnosis not present

## 2019-04-14 DIAGNOSIS — Z79899 Other long term (current) drug therapy: Secondary | ICD-10-CM | POA: Diagnosis not present

## 2019-04-14 DIAGNOSIS — W01198A Fall on same level from slipping, tripping and stumbling with subsequent striking against other object, initial encounter: Secondary | ICD-10-CM | POA: Diagnosis not present

## 2019-04-14 DIAGNOSIS — R52 Pain, unspecified: Secondary | ICD-10-CM | POA: Diagnosis not present

## 2019-04-14 DIAGNOSIS — Z885 Allergy status to narcotic agent status: Secondary | ICD-10-CM | POA: Diagnosis not present

## 2019-04-15 DIAGNOSIS — S8001XA Contusion of right knee, initial encounter: Secondary | ICD-10-CM | POA: Diagnosis not present

## 2019-04-15 DIAGNOSIS — M79672 Pain in left foot: Secondary | ICD-10-CM | POA: Insufficient documentation

## 2019-04-16 DIAGNOSIS — Z6837 Body mass index (BMI) 37.0-37.9, adult: Secondary | ICD-10-CM | POA: Diagnosis not present

## 2019-04-16 DIAGNOSIS — S8001XA Contusion of right knee, initial encounter: Secondary | ICD-10-CM | POA: Diagnosis not present

## 2019-04-17 DIAGNOSIS — Z79899 Other long term (current) drug therapy: Secondary | ICD-10-CM | POA: Diagnosis not present

## 2019-04-28 DIAGNOSIS — M76821 Posterior tibial tendinitis, right leg: Secondary | ICD-10-CM | POA: Diagnosis not present

## 2019-04-28 DIAGNOSIS — M79671 Pain in right foot: Secondary | ICD-10-CM | POA: Diagnosis not present

## 2019-04-30 DIAGNOSIS — M797 Fibromyalgia: Secondary | ICD-10-CM | POA: Diagnosis not present

## 2019-04-30 DIAGNOSIS — M25512 Pain in left shoulder: Secondary | ICD-10-CM | POA: Diagnosis not present

## 2019-04-30 DIAGNOSIS — M79622 Pain in left upper arm: Secondary | ICD-10-CM | POA: Diagnosis not present

## 2019-04-30 DIAGNOSIS — M545 Low back pain: Secondary | ICD-10-CM | POA: Diagnosis not present

## 2019-04-30 DIAGNOSIS — M5010 Cervical disc disorder with radiculopathy, unspecified cervical region: Secondary | ICD-10-CM | POA: Diagnosis not present

## 2019-06-08 ENCOUNTER — Telehealth: Payer: Self-pay | Admitting: Rheumatology

## 2019-06-08 DIAGNOSIS — M1120 Other chondrocalcinosis, unspecified site: Secondary | ICD-10-CM

## 2019-06-08 DIAGNOSIS — Z79899 Other long term (current) drug therapy: Secondary | ICD-10-CM

## 2019-06-08 NOTE — Telephone Encounter (Signed)
Patient called requesting labwork orders be sent to her PCP Dr. Sherrie Sport. Patient states she will be scheduling an appointment by the end of the week.

## 2019-06-09 NOTE — Telephone Encounter (Signed)
Patient wanted to let you know labs will be done in three weeks. If patient needs labs done before then, please call to let her know.

## 2019-06-09 NOTE — Telephone Encounter (Signed)
Lab Orders released and faxed.  °

## 2019-07-23 NOTE — Progress Notes (Signed)
Virtual Visit via Telephone Note  I connected with Dorothy Ford on 08/06/19 at 10:40 AM EST by telephone and verified that I am speaking with the correct person using two identifiers.  Location: Patient: Home Provider: Clinic  This service was conducted via virtual visit.  Both audio and visual tools were used.  The patient was located at home. I was located in my office.  Consent was obtained prior to the virtual visit and is aware of possible charges through their insurance for this visit.  The patient is an established patient.  Dr. Estanislado Pandy, MD conducted the virtual visit and Hazel Sams, PA-C acted as scribe during the service.  Office staff helped with scheduling follow up visits after the service was conducted.   I discussed the limitations, risks, security and privacy concerns of performing an evaluation and management service by telephone and the availability of in person appointments. I also discussed with the patient that there may be a patient responsible charge related to this service. The patient expressed understanding and agreed to proceed.  CC: Left knee joint pain  History of Present Illness: Patient is a 68 year old female with a past medical history of pseudogout, DDD, and osteoarthritis. She is taking colcrys 0.6 mg 1 tablet by mouth daily. She denies any pseudogout flares.  She states her fibromyalgia pain has been manageable.  She continues to have chronic fatigue. She states 6 days ago she was dizzy, and she fell at home.  She was evaluated in the ED and has a hematoma over the left eye.  She was evaluated by her PCP yesterday who discussed that it may take about 8 weeks for the hematoma to resolve.   She continues to have pain in both knee joints. She is having nocturnal pain in the left knee joint.  She takes oxycodone for pain relief.   Review of Systems  Constitutional: Positive for malaise/fatigue. Negative for fever.  HENT:       +Dry mouth  Eyes: Negative  for photophobia, pain, discharge and redness.  Respiratory: Negative for cough, shortness of breath and wheezing.   Cardiovascular: Negative for chest pain and palpitations.  Gastrointestinal: Negative for blood in stool, constipation and diarrhea.  Genitourinary: Negative for dysuria.  Musculoskeletal: Positive for falls and joint pain. Negative for back pain, myalgias and neck pain.  Skin: Negative for rash.  Neurological: Negative for dizziness and headaches.  Psychiatric/Behavioral: Negative for depression. The patient is nervous/anxious and has insomnia.       Observations/Objective: Physical Exam  Constitutional: She is oriented to person, place, and time.  Neurological: She is alert and oriented to person, place, and time.  Psychiatric: Mood, memory, affect and judgment normal.   Patient reports morning stiffness for 30 minutes.   Patient reports nocturnal pain.  Difficulty dressing/grooming: Denies Difficulty climbing stairs: Reports Difficulty getting out of chair: Reports Difficulty using hands for taps, buttons, cutlery, and/or writing: Denies   Assessment and Plan: Visit Diagnoses: Pseudogout -She has not had any recent pseudogout flares. She takes Colcrys 0.6 mg 1 tablet by mouth daily.  She will continue taking colcrys as prescribed.  She does not need any refills at this time.  She will continue to have lab work drawn by her PCP.  She was advised to notify us if she develops signs or symptoms of a pseudogout flare.  She will follow up in 6 months.   Primary osteoarthritis of both hands: She is not having any hand pain  or joint swelling currently.  Joint protection and muscle strengthening were discussed.   Primary osteoarthritis of both knees: She has chronic pain in both knee joints.  She experiences nocturnal pain in the left knee joint.  She has no mechanical symptoms or joint inflammation at this time.  We discussed the importance of lower extremity strengthening.    DDD (degenerative disc disease), cervical: She has no neck pain at this time.   DDD (degenerative disc disease), lumbar: She has intermittent lower back pain.  She takes oxycodone for pain relief.    Fibromyalgia - Her fibromyalgia pain has been manageable recently.  She takes Oxycodone 5 mg po 1 tablet po prn for pain relief, which has been effective at managing her discomfort.  She was encouraged to try to exercise and stretch on a regular basis.  She continues to have frequent falls, so the importance of lower extremity muscle strengthening was discussed. She had a fall 6 days ago and was evaluated in the ED and has a hematoma over the left eye.  She continues to take warfain.  She was evaluated by her PCP yesterday as well.   Osteopenia of multiple sites: DEXA on 02/18/17 BMD left femoral neck 0.898 and T-score -1.0.    Other medical conditions are listed as follows:   History of sleep apnea  History of COPD  History of CHF (congestive heart failure)  History of glaucoma  History of pulmonary embolism   Follow Up Instructions: She will follow up in 6 months.    I discussed the assessment and treatment plan with the patient. The patient was provided an opportunity to ask questions and all were answered. The patient agreed with the plan and demonstrated an understanding of the instructions.   The patient was advised to call back or seek an in-person evaluation if the symptoms worsen or if the condition fails to improve as anticipated.  I provided 15 minutes of non-face-to-face time during this encounter.   Bo Merino, MD   Scribed by-  Hazel Sams, PA-C

## 2019-08-06 ENCOUNTER — Other Ambulatory Visit: Payer: Self-pay

## 2019-08-06 ENCOUNTER — Telehealth (INDEPENDENT_AMBULATORY_CARE_PROVIDER_SITE_OTHER): Payer: Medicare Other | Admitting: Rheumatology

## 2019-08-06 ENCOUNTER — Encounter: Payer: Self-pay | Admitting: Rheumatology

## 2019-08-06 DIAGNOSIS — M8589 Other specified disorders of bone density and structure, multiple sites: Secondary | ICD-10-CM

## 2019-08-06 DIAGNOSIS — Z86711 Personal history of pulmonary embolism: Secondary | ICD-10-CM

## 2019-08-06 DIAGNOSIS — M5030 Other cervical disc degeneration, unspecified cervical region: Secondary | ICD-10-CM | POA: Diagnosis not present

## 2019-08-06 DIAGNOSIS — M1120 Other chondrocalcinosis, unspecified site: Secondary | ICD-10-CM | POA: Diagnosis not present

## 2019-08-06 DIAGNOSIS — Z8709 Personal history of other diseases of the respiratory system: Secondary | ICD-10-CM

## 2019-08-06 DIAGNOSIS — M19041 Primary osteoarthritis, right hand: Secondary | ICD-10-CM | POA: Diagnosis not present

## 2019-08-06 DIAGNOSIS — Z8679 Personal history of other diseases of the circulatory system: Secondary | ICD-10-CM

## 2019-08-06 DIAGNOSIS — M170 Bilateral primary osteoarthritis of knee: Secondary | ICD-10-CM | POA: Diagnosis not present

## 2019-08-06 DIAGNOSIS — Z8669 Personal history of other diseases of the nervous system and sense organs: Secondary | ICD-10-CM

## 2019-08-06 DIAGNOSIS — M797 Fibromyalgia: Secondary | ICD-10-CM

## 2019-08-06 DIAGNOSIS — M19042 Primary osteoarthritis, left hand: Secondary | ICD-10-CM

## 2019-08-06 DIAGNOSIS — M5136 Other intervertebral disc degeneration, lumbar region: Secondary | ICD-10-CM

## 2019-11-09 ENCOUNTER — Other Ambulatory Visit: Payer: Self-pay | Admitting: Cardiovascular Disease

## 2019-11-19 ENCOUNTER — Other Ambulatory Visit: Payer: Self-pay | Admitting: Cardiovascular Disease

## 2019-11-25 ENCOUNTER — Encounter: Payer: Self-pay | Admitting: Radiology

## 2019-12-07 DIAGNOSIS — M545 Low back pain, unspecified: Secondary | ICD-10-CM | POA: Insufficient documentation

## 2019-12-07 DIAGNOSIS — M25579 Pain in unspecified ankle and joints of unspecified foot: Secondary | ICD-10-CM | POA: Insufficient documentation

## 2019-12-07 DIAGNOSIS — M79622 Pain in left upper arm: Secondary | ICD-10-CM | POA: Insufficient documentation

## 2019-12-07 DIAGNOSIS — M5416 Radiculopathy, lumbar region: Secondary | ICD-10-CM | POA: Insufficient documentation

## 2019-12-09 ENCOUNTER — Encounter: Payer: Self-pay | Admitting: Cardiovascular Disease

## 2019-12-09 ENCOUNTER — Other Ambulatory Visit: Payer: Self-pay

## 2019-12-09 ENCOUNTER — Ambulatory Visit (INDEPENDENT_AMBULATORY_CARE_PROVIDER_SITE_OTHER): Payer: Medicare Other | Admitting: Cardiovascular Disease

## 2019-12-09 VITALS — BP 124/80 | HR 60 | Ht 61.0 in | Wt 203.0 lb

## 2019-12-09 DIAGNOSIS — I10 Essential (primary) hypertension: Secondary | ICD-10-CM | POA: Diagnosis not present

## 2019-12-09 DIAGNOSIS — I5032 Chronic diastolic (congestive) heart failure: Secondary | ICD-10-CM

## 2019-12-09 DIAGNOSIS — R600 Localized edema: Secondary | ICD-10-CM | POA: Diagnosis not present

## 2019-12-09 DIAGNOSIS — I471 Supraventricular tachycardia: Secondary | ICD-10-CM

## 2019-12-09 DIAGNOSIS — I2782 Chronic pulmonary embolism: Secondary | ICD-10-CM

## 2019-12-09 MED ORDER — MEDICAL COMPRESSION STOCKINGS MISC: 1.0000 | 0 refills | Status: AC

## 2019-12-09 MED ORDER — MEDICAL COMPRESSION STOCKINGS MISC: 1.0000 | 0 refills | Status: DC

## 2019-12-09 NOTE — Progress Notes (Signed)
SUBJECTIVE: The patient presents for routine follow-up.  I have not personally evaluated her since April 2019.  She has a history of PSVT, sinus tachycardia with palpitations, hypertension, chronic diastolic heart failure, COPD, and pulmonary embolism. She also has anxiety with panic attacks.  Nuclear stress test on 10/15/16 was normal, LVEF greater than 65%.  Chronic exertional dyspnea stable.  She denies palpitations and chest pain.  Primary complaints relate to diffuse, bony arthritic pain including left knee, left shoulder, and lower back as well as her cervical spine.  She also has chronic bilateral leg swelling due to venous insufficiency.  She wears a right leg brace for her ankle.  She denies chest pain.  She only has palpitations when she has a panic attack.  She told me seasonal allergies also contribute to panic attacks.   Review of Systems: As per "subjective", otherwise negative.  Allergies  Allergen Reactions  . Adhesive [Tape] Rash  . Codeine Nausea And Vomiting    REACTION: vomiting    Current Outpatient Medications  Medication Sig Dispense Refill  . albuterol (PROVENTIL HFA;VENTOLIN HFA) 108 (90 Base) MCG/ACT inhaler Inhale into the lungs every 6 (six) hours as needed for wheezing or shortness of breath.    Marland Kitchen alendronate (FOSAMAX) 70 MG tablet Take 70 mg by mouth once a week. Take with a full glass of water on an empty stomach.    Marland Kitchen amLODipine (NORVASC) 5 MG tablet Take 5 mg by mouth daily.    Marland Kitchen atorvastatin (LIPITOR) 40 MG tablet Take 40 mg by mouth daily.    Marland Kitchen azelastine (ASTELIN) 0.1 % nasal spray Place 1 spray into both nostrils daily.    . cetirizine (ZYRTEC) 10 MG tablet Take 10 mg by mouth daily.    . clonazePAM (KLONOPIN) 0.5 MG tablet Take 0.5 mg by mouth at bedtime.     . clotrimazole-betamethasone (LOTRISONE) cream as needed.     . COLCRYS 0.6 MG tablet TAKE 1 TABLET BY MOUTH ONCE DAILY. 30 tablet 0  . cycloSPORINE (RESTASIS) 0.05 % ophthalmic  emulsion Place 1 drop into both eyes 2 (two) times daily.    . diclofenac (VOLTAREN) 75 MG EC tablet Take 75 mg by mouth 2 (two) times daily.     . DULoxetine (CYMBALTA) 60 MG capsule Take 60 mg by mouth daily.     . fluticasone (FLONASE) 50 MCG/ACT nasal spray Place 1 spray into the nose daily.     . furosemide (LASIX) 40 MG tablet TAKE 1 TABLET BY MOUTH ONCE DAILY. 30 tablet 1  . gabapentin (NEURONTIN) 300 MG capsule Take 300 mg by mouth 3 (three) times daily.     Marland Kitchen guaiFENesin (MUCINEX) 600 MG 12 hr tablet Take 600 mg by mouth 2 (two) times daily as needed.    . hydrOXYzine (VISTARIL) 25 MG capsule Take 25 mg by mouth 3 (three) times daily as needed.     Marland Kitchen ipratropium (ATROVENT) 0.06 % nasal spray Place 1 spray into the nose daily.    Marland Kitchen ipratropium-albuterol (DUONEB) 0.5-2.5 (3) MG/3ML SOLN Take 3 mLs by nebulization every 6 (six) hours as needed.     . levalbuterol (XOPENEX HFA) 45 MCG/ACT inhaler Inhale 2 puffs into the lungs every 6 (six) hours as needed for wheezing.    Marland Kitchen levocetirizine (XYZAL) 5 MG tablet Take 5 mg by mouth daily.    Marland Kitchen levothyroxine (SYNTHROID, LEVOTHROID) 50 MCG tablet Take 50 mcg by mouth daily before breakfast.     .  loperamide (IMODIUM A-D) 2 MG tablet Take 2 mg by mouth 4 (four) times daily as needed for diarrhea or loose stools.    . methocarbamol (ROBAXIN) 500 MG tablet Take 500 mg by mouth as needed.     . metoprolol succinate (TOPROL-XL) 25 MG 24 hr tablet Take 1 tablet (25 mg total) by mouth daily.    . montelukast (SINGULAIR) 10 MG tablet Take 1 tablet by mouth daily.    Marland Kitchen nystatin (MYCOSTATIN) powder Apply topically as needed.    . ondansetron (ZOFRAN) 4 MG tablet as needed.     Marland Kitchen oxyCODONE (OXY IR/ROXICODONE) 5 MG immediate release tablet as needed.     . pantoprazole (PROTONIX) 40 MG tablet Take 40 mg by mouth daily.    Marland Kitchen PARoxetine (PAXIL) 30 MG tablet Take 30 mg by mouth daily.    . potassium chloride (K-DUR) 10 MEQ tablet Take 3 tablets (30 mEq total)  by mouth daily. 90 tablet 6  . Sennosides (SENOKOT PO) Take by mouth in the morning and at bedtime.     . Simethicone (GAS-X EXTRA STRENGTH) 125 MG CAPS Take 2 capsules by mouth as needed.    . SYMBICORT 160-4.5 MCG/ACT inhaler Inhale 2 puffs into the lungs 2 (two) times daily.    Marland Kitchen tiZANidine (ZANAFLEX) 4 MG tablet Take 4 mg by mouth as needed.    . VOLTAREN 1 % GEL Apply 1 g topically 2 (two) times daily.    Marland Kitchen warfarin (COUMADIN) 5 MG tablet Take 5 mg by mouth daily. Managed by [place] office  2.5mg  Monday thru Friday. 5 mg on Saturday and Sunday.     No current facility-administered medications for this visit.    Past Medical History:  Diagnosis Date  . Acid reflux   . Asthma   . Cardiac dysrhythmia, unspecified   . Carotid bruit    Bilateral  . Cataract   . Chest pain, unspecified   . Chronic airway obstruction, not elsewhere classified   . Chronic sinusitis   . Congestive heart failure, unspecified   . DDD (degenerative disc disease), cervical 07/24/2016  . DDD (degenerative disc disease), lumbar 07/24/2016  . Depressive disorder, not elsewhere classified   . 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 Edema    left leg  . Fibromyalgia   . High cholesterol   . History of rheumatoid arthritis   . Obesity, unspecified   . Obstructive lung disease (generalized) ([place])   . Osteoarthritis of both hands 07/24/2016  . Osteoporosis 07/24/2016  . Palpitations   . Pseudogout involving multiple joints   . SVT (supraventricular tachycardia) ([place])   . Syncope    admitted 09/2011  . Thyroid disease   . Unspecified essential hypertension     Past Surgical History:  Procedure Laterality Date  . CARPAL TUNNEL RELEASE    . CATHETER ABLATION  02/1997   @ [place]      Social History   Socioeconomic History  . Marital status: Single    Spouse name: Not on file  . Number of children: Not on file  . Years of education: Not on file   . Highest education level: Not on file  Occupational History  . Not on file  Tobacco Use  . Smoking status: Never Smoker  . Smokeless tobacco: Never Used  Substance and Sexual Activity  . Alcohol use: No    Alcohol/week: 0.0 standard drinks  . Drug use: Never  .  Sexual activity: Not on file  Other Topics Concern  . Not on file  Social History Narrative  . Not on file   Social Determinants of Health   Financial Resource Strain:   . Difficulty of Paying Living Expenses:   Food Insecurity:   . Worried About Charity fundraiser in the Last Year:   . Arboriculturist in the Last Year:   Transportation Needs:   . Film/video editor (Medical):   Marland Kitchen Lack of Transportation (Non-Medical):   Physical Activity:   . Days of Exercise per Week:   . Minutes of Exercise per Session:   Stress:   . Feeling of Stress :   Social Connections:   . Frequency of Communication with Friends and Family:   . Frequency of Social Gatherings with Friends and Family:   . Attends Religious Services:   . Active Member of Clubs or Organizations:   . Attends Archivist Meetings:   Marland Kitchen Marital Status:   Intimate Partner Violence:   . Fear of Current or Ex-Partner:   . Emotionally Abused:   Marland Kitchen Physically Abused:   . Sexually Abused:     Barbarann Ehlers, RN was present throughout the entirety of the encounter.  Vitals:   12/09/19 1101  BP: 124/80  Pulse: 60  SpO2: 93%  Weight: 203 lb (92.1 kg)  Height: 5\' 1"  (1.549 m)    Wt Readings from Last 3 Encounters:  12/09/19 203 lb (92.1 kg)  03/27/19 192 lb (87.1 kg)  02/05/19 189 lb 12.8 oz (86.1 kg)     PHYSICAL EXAM General: Obese female in NAD HEENT: Normal. Neck: No JVD, no thyromegaly. Lungs: Clear to auscultation bilaterally with normal respiratory effort. CV: Regular rate and rhythm, normal S1/S2, no S3/S4, no murmur.  Bilateral lower extremity venous insufficiency with trace edema.  No carotid bruit.   Abdomen: Soft,  nontender, no distention.  Neurologic: Alert and oriented.  Psych: Normal affect. Skin: Normal. Musculoskeletal: No gross deformities.      Labs: Lab Results  Component Value Date/Time   K 3.2 (L) 08/12/2018 02:50 PM   BUN 7 (L) 08/12/2018 02:50 PM   CREATININE 0.97 08/12/2018 02:50 PM   CREATININE 0.95 06/26/2018 08:56 AM   ALT 18 06/26/2018 08:56 AM   HGB 12.0 06/26/2018 08:56 AM     Lipids: No results found for: LDLCALC, LDLDIRECT, CHOL, TRIG, HDL     ASSESSMENT AND PLAN:  1.  Palpitations/sinus tachycardia/history of PSVT: Status post SVT ablation in 1998.  Symptomatically stable on Toprol-XL 25 mg daily.  No changes.  2.  Chronic diastolic heart failure: Euvolemic on Lasix 40 mg daily.  No changes.  3.  Pulmonary embolism: Remains on chronic anticoagulation with warfarin.  4.  Hypertension: Blood pressure is normal.  No changes to therapy.  5.  Bilateral leg edema/chronic venous insufficiency: I will prescribe knee-high compression stockings.   Disposition: Follow up 1 year   Kate Sable, M.D., F.A.C.C.

## 2019-12-09 NOTE — Patient Instructions (Signed)
Medication Instructions:  Your physician recommends that you continue on your current medications as directed. Please refer to the Current Medication list given to you today.  *If you need a refill on your cardiac medications before your next appointment, please call your pharmacy*   Lab Work: none today  If you have labs (blood work) drawn today and your tests are completely normal, you will receive your results only by: Marland Kitchen MyChart Message (if you have MyChart) OR . A paper copy in the mail If you have any lab test that is abnormal or we need to change your treatment, we will call you to review the results.   Testing/Procedures: None today   Follow-Up: At [facility], you and your health needs are our priority.  As part of our continuing mission to provide you with exceptional heart care, we have created designated Provider Care Teams.  These Care Teams include your primary Cardiologist (physician) and Advanced Practice Providers (APPs -  Physician Assistants and Nurse Practitioners) who all work together to provide you with the care you need, when you need it.  We recommend signing up for the patient portal called "MyChart".  Sign up information is provided on this After Visit Summary.  MyChart is used to connect with patients for Virtual Visits (Telemedicine).  Patients are able to view lab/test results, encounter notes, upcoming appointments, etc.  Non-urgent messages can be sent to your provider as well.   To learn more about what you can do with MyChart, go to [email or web address].    Your next appointment:   12 month(s)  The format for your next appointment:   In Person  Provider:   Katina Dung, NP   Other Instructions Please get knee-hi length compression stockings        Thank you for choosing [facility] !

## 2019-12-09 NOTE — Addendum Note (Signed)
Addended by: Barbarann Ehlers A on: 12/09/2019 11:36 AM   Modules accepted: Orders

## 2019-12-10 ENCOUNTER — Ambulatory Visit: Payer: Medicare Other | Admitting: Cardiovascular Disease

## 2019-12-21 ENCOUNTER — Ambulatory Visit: Payer: Medicare Other | Admitting: Orthopedic Surgery

## 2019-12-21 NOTE — Progress Notes (Deleted)
Dorothy Ford  12/21/2019  There is no height or weight on file to calculate BMI.   HISTORY SECTION :  No chief complaint on file.  HPI The patient presents for evaluation of  (mild/moderate/severe/ ) *** pain, in the (right /left) *** , for *** , associated with ***.  Prior treatment ***   ROS   has a past medical history of Acid reflux, Asthma, Cardiac dysrhythmia, unspecified, Carotid bruit, Cataract, Chest pain, unspecified, Chronic airway obstruction, not elsewhere classified, Chronic sinusitis, Congestive heart failure, unspecified, DDD (degenerative disc disease), cervical (07/24/2016), DDD (degenerative disc disease), lumbar (07/24/2016), Depressive disorder, not elsewhere classified, Diaphragmatic hernia without mention of obstruction or gangrene, Edema, Fibromyalgia, High cholesterol, History of rheumatoid arthritis, Obesity, unspecified, Obstructive lung disease (generalized) ([place]), Osteoarthritis of both hands (07/24/2016), Osteoporosis (07/24/2016), Palpitations, Pseudogout involving multiple joints, SVT (supraventricular tachycardia) ([place]), Syncope, Thyroid disease, and Unspecified essential hypertension.   Past Surgical History:  Procedure Laterality Date  . CARPAL TUNNEL RELEASE    . CATHETER ABLATION  02/1997   @ [place]      There is no height or weight on file to calculate BMI.   Allergies  Allergen Reactions  . Adhesive [Tape] Rash  . Codeine Nausea And Vomiting    REACTION: vomiting     Current Outpatient Medications:  .  albuterol (PROVENTIL HFA;VENTOLIN HFA) 108 (90 Base) MCG/ACT inhaler, Inhale into the lungs every 6 (six) hours as needed for wheezing or shortness of breath., Disp: , Rfl:  .  alendronate (FOSAMAX) 70 MG tablet, Take 70 mg by mouth once a week. Take with a full glass of water on an empty stomach., Disp: , Rfl:  .  amLODipine (NORVASC) 5 MG tablet, Take 5 mg by mouth daily., Disp: , Rfl:  .  atorvastatin  (LIPITOR) 40 MG tablet, Take 40 mg by mouth daily., Disp: , Rfl:  .  azelastine (ASTELIN) 0.1 % nasal spray, Place 1 spray into both nostrils daily., Disp: , Rfl:  .  cetirizine (ZYRTEC) 10 MG tablet, Take 10 mg by mouth daily., Disp: , Rfl:  .  clonazePAM (KLONOPIN) 0.5 MG tablet, Take 0.5 mg by mouth at bedtime. , Disp: , Rfl:  .  clotrimazole-betamethasone (LOTRISONE) cream, as needed. , Disp: , Rfl:  .  COLCRYS 0.6 MG tablet, TAKE 1 TABLET BY MOUTH ONCE DAILY., Disp: 30 tablet, Rfl: 0 .  cycloSPORINE (RESTASIS) 0.05 % ophthalmic emulsion, Place 1 drop into both eyes 2 (two) times daily., Disp: , Rfl:  .  diclofenac (VOLTAREN) 75 MG EC tablet, Take 75 mg by mouth 2 (two) times daily. , Disp: , Rfl:  .  DULoxetine (CYMBALTA) 60 MG capsule, Take 60 mg by mouth daily. , Disp: , Rfl:  .  Elastic Bandages & Supports ([place]) MISC, 1 each by Does not apply route as directed. 1 pair low pressure knee high compression stockings Dx: leg edema, Disp: 1 each, Rfl: 0 .  fluticasone (FLONASE) 50 MCG/ACT nasal spray, Place 1 spray into the nose daily. , Disp: , Rfl:  .  furosemide (LASIX) 40 MG tablet, TAKE 1 TABLET BY MOUTH ONCE DAILY., Disp: 30 tablet, Rfl: 1 .  gabapentin (NEURONTIN) 300 MG capsule, Take 300 mg by mouth 3 (three) times daily. , Disp: , Rfl:  .  guaiFENesin (MUCINEX) 600 MG 12 hr tablet, Take 600 mg by mouth 2 (two) times daily as needed., Disp: , Rfl:  .  hydrOXYzine (VISTARIL) 25 MG  capsule, Take 25 mg by mouth 3 (three) times daily as needed. , Disp: , Rfl:  .  ipratropium (ATROVENT) 0.06 % nasal spray, Place 1 spray into the nose daily., Disp: , Rfl:  .  ipratropium-albuterol (DUONEB) 0.5-2.5 (3) MG/3ML SOLN, Take 3 mLs by nebulization every 6 (six) hours as needed. , Disp: , Rfl:  .  levalbuterol (XOPENEX HFA) 45 MCG/ACT inhaler, Inhale 2 puffs into the lungs every 6 (six) hours as needed for wheezing., Disp: , Rfl:  .  levocetirizine (XYZAL) 5 MG tablet, Take 5  mg by mouth daily., Disp: , Rfl:  .  levothyroxine (SYNTHROID, LEVOTHROID) 50 MCG tablet, Take 50 mcg by mouth daily before breakfast. , Disp: , Rfl:  .  loperamide (IMODIUM A-D) 2 MG tablet, Take 2 mg by mouth 4 (four) times daily as needed for diarrhea or loose stools., Disp: , Rfl:  .  methocarbamol (ROBAXIN) 500 MG tablet, Take 500 mg by mouth as needed. , Disp: , Rfl:  .  metoprolol succinate (TOPROL-XL) 25 MG 24 hr tablet, Take 1 tablet (25 mg total) by mouth daily., Disp: , Rfl:  .  montelukast (SINGULAIR) 10 MG tablet, Take 1 tablet by mouth daily., Disp: , Rfl:  .  nystatin (MYCOSTATIN) powder, Apply topically as needed., Disp: , Rfl:  .  ondansetron (ZOFRAN) 4 MG tablet, as needed. , Disp: , Rfl:  .  oxyCODONE (OXY IR/ROXICODONE) 5 MG immediate release tablet, as needed. , Disp: , Rfl:  .  pantoprazole (PROTONIX) 40 MG tablet, Take 40 mg by mouth daily., Disp: , Rfl:  .  PARoxetine (PAXIL) 30 MG tablet, Take 30 mg by mouth daily., Disp: , Rfl:  .  potassium chloride (K-DUR) 10 MEQ tablet, Take 3 tablets (30 mEq total) by mouth daily., Disp: 90 tablet, Rfl: 6 .  Sennosides (SENOKOT PO), Take by mouth in the morning and at bedtime. , Disp: , Rfl:  .  Simethicone (GAS-X EXTRA STRENGTH) 125 MG CAPS, Take 2 capsules by mouth as needed., Disp: , Rfl:  .  SYMBICORT 160-4.5 MCG/ACT inhaler, Inhale 2 puffs into the lungs 2 (two) times daily., Disp: , Rfl:  .  tiZANidine (ZANAFLEX) 4 MG tablet, Take 4 mg by mouth as needed., Disp: , Rfl:  .  VOLTAREN 1 % GEL, Apply 1 g topically 2 (two) times daily., Disp: , Rfl:  .  warfarin (COUMADIN) 5 MG tablet, Take 5 mg by mouth daily. Managed by [place] office  2.5mg  Monday thru Friday. 5 mg on Saturday and Sunday., Disp: , Rfl:    PHYSICAL EXAM SECTION: 1) There were no vitals taken for this visit.  There is no height or weight on file to calculate BMI. General appearance: Well-developed well-nourished no gross deformities  2) Cardiovascular normal  pulse and perfusion , normal color   3) Neurologically deep tendon reflexes are equal and normal, no sensation loss or deficits no pathologic reflexes  4) Psychological: Awake alert and oriented x3 mood and affect normal  5) Skin no lacerations or ulcerations no nodularity no palpable masses, no erythema or nodularity  6) Musculoskeletal:   ***   MEDICAL DECISION MAKING  A. No diagnosis found.  B. DATA ANALYSED:  IMAGING: Independent interpretation of images: ***  Orders: ***  Outside records reviewed: ***  C. MANAGEMENT ***  No orders of the defined types were placed in this encounter.     Arther Abbott, MD  12/21/2019 8:18 AM

## 2019-12-30 ENCOUNTER — Telehealth: Payer: Self-pay | Admitting: Rheumatology

## 2019-12-30 NOTE — Telephone Encounter (Signed)
Opened in error

## 2020-01-01 ENCOUNTER — Ambulatory Visit: Payer: Medicare Other | Admitting: Orthopedic Surgery

## 2020-01-07 ENCOUNTER — Encounter: Payer: Self-pay | Admitting: Orthopedic Surgery

## 2020-01-07 ENCOUNTER — Other Ambulatory Visit: Payer: Self-pay | Admitting: Cardiovascular Disease

## 2020-01-19 ENCOUNTER — Ambulatory Visit: Payer: Medicare Other

## 2020-01-19 ENCOUNTER — Other Ambulatory Visit: Payer: Self-pay

## 2020-01-19 ENCOUNTER — Ambulatory Visit (INDEPENDENT_AMBULATORY_CARE_PROVIDER_SITE_OTHER): Payer: Medicare Other | Admitting: Orthopedic Surgery

## 2020-01-19 VITALS — Ht 61.0 in | Wt 203.0 lb

## 2020-01-19 DIAGNOSIS — M25561 Pain in right knee: Secondary | ICD-10-CM

## 2020-01-19 DIAGNOSIS — M25562 Pain in left knee: Secondary | ICD-10-CM

## 2020-01-19 DIAGNOSIS — G8929 Other chronic pain: Secondary | ICD-10-CM

## 2020-01-19 NOTE — Patient Instructions (Signed)
You had a second opinion  The second opinion I recommend  Neurosurgical referral  Injections as needed

## 2020-01-19 NOTE — Progress Notes (Signed)
NEW PROBLEM//OFFICE VISIT  Chief Complaint  Patient presents with  . 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  She was followed by Dr. Case and also sees rheumatologist and Lynann Bologna but comes to [place] with 20-year history of chronic left knee pain with current pain 10 out of 10 despite taking oxycodone twice daily, she has had multiple steroid injections with no relief she says the pain is worse than it was since she got her last injection.  She has a history of degenerative disc disease in the lumbar and cervical spine recent bone density shows osteoporosis, she has a history of previous pulmonary embolism atrial fibrillation SVT congestive heart failure COPD.  BMI is 38  Review of systems   Review of Systems  All other systems reviewed and are negative.  Positive findings included itching hearing loss ringing of the ears blurred vision heart palpitations shortness of breath lying down pain in her legs after walking with swelling cough shortness of breath wheezing blood in the stool nausea easy bruising pollen allergy dizziness tingling tremor back pain neck pain joint pain frequent falls nervousness  Past Medical History:  Diagnosis Date  . Acid reflux   . Asthma   . Cardiac dysrhythmia, unspecified   . Carotid bruit    Bilateral  . Cataract   . Chest pain, unspecified   . Chronic airway obstruction, not elsewhere classified   . Chronic sinusitis   . Congestive heart failure, unspecified   . DDD (degenerative disc disease), cervical 07/24/2016  . DDD (degenerative disc disease), lumbar 07/24/2016  . Depressive disorder, not elsewhere classified   . 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 Edema    left leg  . Fibromyalgia   . High cholesterol   . History of  rheumatoid arthritis   . Obesity, unspecified   . Obstructive lung disease (generalized) ([place])   . Osteoarthritis of both hands 07/24/2016  . Osteoporosis 07/24/2016  . Palpitations   . Pseudogout involving multiple joints   . SVT (supraventricular tachycardia) ([place])   . Syncope    admitted 09/2011  . Thyroid disease   . Unspecified essential hypertension     Past Surgical History:  Procedure Laterality Date  . CARPAL TUNNEL RELEASE    . CATHETER ABLATION  02/1997   @ [facility]  . VEIN SURGERY BLADDER      Family History  Problem Relation Age of Onset  . Heart disease Mother   . Hypertension Mother   . Stroke Other   . Heart disease Father   . Hypertension Father   . Kidney failure Sister   . Asthma Sister    Social History   Tobacco Use  . Smoking status: Never Smoker  . Smokeless tobacco: Never Used  Substance Use Topics  . Alcohol use: No    Alcohol/week: 0.0 standard drinks  . Drug use: Never    Allergies  Allergen Reactions  . Adhesive [Tape] Rash  . Codeine Nausea And Vomiting    REACTION: vomiting    Current Meds  Medication Sig  . albuterol (PROVENTIL HFA;VENTOLIN HFA) 108 (90 Base) MCG/ACT inhaler Inhale into the lungs every 6 (six) hours as needed for wheezing or shortness of breath.  Marland Kitchen alendronate (FOSAMAX) 70 MG tablet Take  70 mg by mouth once a week. Take with a full glass of water on an empty stomach.  Marland Kitchen amLODipine (NORVASC) 5 MG tablet Take 5 mg by mouth daily.  Marland Kitchen atorvastatin (LIPITOR) 40 MG tablet Take 40 mg by mouth daily.  Marland Kitchen azelastine (ASTELIN) 0.1 % nasal spray Place 1 spray into both nostrils daily.  . cetirizine (ZYRTEC) 10 MG tablet Take 10 mg by mouth daily.  . clonazePAM (KLONOPIN) 0.5 MG tablet Take 0.5 mg by mouth at bedtime.   . clotrimazole-betamethasone (LOTRISONE) cream as needed.   . COLCRYS 0.6 MG tablet TAKE 1 TABLET BY MOUTH ONCE DAILY.  . cycloSPORINE (RESTASIS) 0.05 % ophthalmic emulsion Place 1 drop into both eyes 2  (two) times daily.  . diclofenac (VOLTAREN) 75 MG EC tablet Take 75 mg by mouth 2 (two) times daily.   . DULoxetine (CYMBALTA) 60 MG capsule Take 60 mg by mouth daily.   Regino Schultze Bandages & Supports (MEDICAL COMPRESSION STOCKINGS) MISC 1 each by Does not apply route as directed. 1 pair low pressure knee high compression stockings Dx: leg edema  . fluticasone (FLONASE) 50 MCG/ACT nasal spray Place 1 spray into the nose daily.   . furosemide (LASIX) 40 MG tablet TAKE 1 TABLET BY MOUTH ONCE DAILY.  Marland Kitchen gabapentin (NEURONTIN) 300 MG capsule Take 300 mg by mouth 3 (three) times daily.   Marland Kitchen guaiFENesin (MUCINEX) 600 MG 12 hr tablet Take 600 mg by mouth 2 (two) times daily as needed.  . hydrOXYzine (VISTARIL) 25 MG capsule Take 25 mg by mouth 3 (three) times daily as needed.   Marland Kitchen ipratropium (ATROVENT) 0.06 % nasal spray Place 1 spray into the nose daily.  Marland Kitchen ipratropium-albuterol (DUONEB) 0.5-2.5 (3) MG/3ML SOLN Take 3 mLs by nebulization every 6 (six) hours as needed.   . levalbuterol (XOPENEX HFA) 45 MCG/ACT inhaler Inhale 2 puffs into the lungs every 6 (six) hours as needed for wheezing.  Marland Kitchen levocetirizine (XYZAL) 5 MG tablet Take 5 mg by mouth daily.  Marland Kitchen levothyroxine (SYNTHROID, LEVOTHROID) 50 MCG tablet Take 50 mcg by mouth daily before breakfast.   . loperamide (IMODIUM A-D) 2 MG tablet Take 2 mg by mouth 4 (four) times daily as needed for diarrhea or loose stools.  . methocarbamol (ROBAXIN) 500 MG tablet Take 500 mg by mouth as needed.   . metoprolol succinate (TOPROL-XL) 25 MG 24 hr tablet Take 1 tablet (25 mg total) by mouth daily.  . montelukast (SINGULAIR) 10 MG tablet Take 1 tablet by mouth daily.  Marland Kitchen nystatin (MYCOSTATIN) powder Apply topically as needed.  . ondansetron (ZOFRAN) 4 MG tablet as needed.   Marland Kitchen oxyCODONE (OXY IR/ROXICODONE) 5 MG immediate release tablet as needed.   . pantoprazole (PROTONIX) 40 MG tablet Take 40 mg by mouth daily.  Marland Kitchen PARoxetine (PAXIL) 30 MG tablet Take 30 mg by  mouth daily.  . potassium chloride (K-DUR) 10 MEQ tablet Take 3 tablets (30 mEq total) by mouth daily.  . Sennosides (SENOKOT PO) Take by mouth in the morning and at bedtime.   . Simethicone (GAS-X EXTRA STRENGTH) 125 MG CAPS Take 2 capsules by mouth as needed.  . SYMBICORT 160-4.5 MCG/ACT inhaler Inhale 2 puffs into the lungs 2 (two) times daily.  Marland Kitchen tiZANidine (ZANAFLEX) 4 MG tablet Take 4 mg by mouth as needed.  . VOLTAREN 1 % GEL Apply 1 g topically 2 (two) times daily.  Marland Kitchen warfarin (COUMADIN) 5 MG tablet Take 5 mg by mouth daily. Managed by [facility]  office  2.5mg  Monday thru Friday. 5 mg on Saturday and Sunday.    Ht 5\' 1"  (1.549 m)   Wt 203 lb (92.1 kg)   BMI 38.36 kg/m   Physical Exam Mesomorphic body habitus oriented x3 mood normal affect flat gait supported by cane station normal   Ortho Exam  Left knee tenderness is global except for posteriorly range of motion is 0-110 knee is stable in all planes quadricep strength is normal skin is intact distal edema is noted extremities warm to touch no sensory deficits to palpation balance seems normal on gait  MEDICAL DECISION MAKING  A.  Encounter Diagnoses  Name Primary?  . Chronic pain of left knee Yes  . Chronic pain of right knee     B. DATA ANALYSED:   First set of outside notes come from [facility] neurology indicating the patient's on current pain management need for secondary opinion and also have notes from Dr. Case indicating the injections in his thought that the patient had osteoarthritis but was not needing surgery at the time  IMAGING: Independent interpretation of images: Office images show bilateral knee arthritis with chondrocalcinosis reasonable alignment osteoarthritis appears moderate   x-ray images dated June 2020 left and right knee also indicate osteoarthritis both knees with moderate joint space narrowing but joint spaces do still have quite a bit of space indicating that there is still some cartilage left  in the knee  C. MANAGEMENT   I recommend that the patient not have surgery she has too many medical problems.  If I did mention she is also on warfarin.  Recommend selective injections as needed.  Continue pain management  Some of her left knee pain is probably coming from her disc condition in her back as her x-rays and exam do not warrant oxycodone for arthritis.  No follow-ups were arranged as this is a second opinion only  No orders of the defined types were placed in this encounter.     Arther Abbott, MD  01/19/2020 2:43 PM

## 2020-02-04 NOTE — Progress Notes (Deleted)
Office Visit Note  Patient: Dorothy Ford             Date of Birth: 1951/03/08           MRN: [id number]             PCP: Neale Burly, MD Referring: Neale Burly, MD Visit Date: 02/18/2020 Occupation: @GUAROCC @  Subjective:  No chief complaint on file.   History of Present Illness: Dorothy Ford is a 69 y.o. female ***   Activities of Daily Living:  Patient reports morning stiffness for *** {minute/hour:19697}.   Patient {ACTIONS;DENIES/REPORTS:21021675::"Denies"} nocturnal pain.  Difficulty dressing/grooming: {ACTIONS;DENIES/REPORTS:21021675::"Denies"} Difficulty climbing stairs: {ACTIONS;DENIES/REPORTS:21021675::"Denies"} Difficulty getting out of chair: {ACTIONS;DENIES/REPORTS:21021675::"Denies"} Difficulty using hands for taps, buttons, cutlery, and/or writing: {ACTIONS;DENIES/REPORTS:21021675::"Denies"}  No Rheumatology ROS completed.   PMFS History:  Patient Active Problem List   Diagnosis Date Noted   Ankle joint pain 12/07/2019   Low back pain 12/07/2019   Lumbar radiculopathy 12/07/2019   Pain of left upper arm 12/07/2019   Acute foot pain, left 04/15/2019   Bursitis/tendonitis, shoulder 04/23/2018   Inflammatory arthropathy 02/20/2018   Shoulder pain 02/20/2018   Pain in both knees 11/20/2017   Chondromalacia patellae, left knee 01/15/2017   Chondromalacia patellae, right knee 01/15/2017   Osteoarthritis of both hands 07/24/2016   DDD (degenerative disc disease), cervical 07/24/2016   DDD (degenerative disc disease), lumbar 07/24/2016   Osteoporosis 07/24/2016   Glaucoma 07/24/2016   Sleep apnea 07/24/2016   Pseudogout 07/19/2016   Primary osteoarthritis of both knees 07/19/2016   Fibromyalgia 07/19/2016   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   Pulmonary embolism ([place]) 01/18/2012   Anxiety 01/18/2012   Syncope    RHEUMATOID LUNG 12/30/2009   OBESITY, UNSPECIFIED 06/02/2009   DEPRESSIVE DISORDER NOT ELSEWHERE  CLASSIFIED 06/02/2009   SUPRAVENTRICULAR TACHYCARDIA 06/02/2009   Chronic diastolic heart failure ([place]) 06/02/2009   COPD 06/02/2009   DIAPHRAGMAT HERN W/O MENTION OBSTRUCTION/GANGREN 06/02/2009   PALPITATIONS 06/02/2009   CHEST PAIN UNSPECIFIED 06/02/2009    Past Medical History:  Diagnosis Date   Acid reflux    Asthma    Cardiac dysrhythmia, unspecified    Carotid bruit    Bilateral   Cataract    Chest pain, unspecified    Chronic airway obstruction, not elsewhere classified    Chronic sinusitis    Congestive heart failure, unspecified    DDD (degenerative disc disease), cervical 07/24/2016   DDD (degenerative disc disease), lumbar 07/24/2016   Depressive disorder, not elsewhere classified    Diaphragmatic hernia without mention of obstruction or gangrene    Edema    left leg   Fibromyalgia    High cholesterol    History of rheumatoid arthritis    Obesity, unspecified    Obstructive lung disease (generalized) ([place])    Osteoarthritis of both hands 07/24/2016   Osteoporosis 07/24/2016   Palpitations    Pseudogout involving multiple joints    SVT (supraventricular tachycardia) ([place])    Syncope    admitted 09/2011   Thyroid disease    Unspecified essential hypertension     Family History  Problem Relation Age of Onset   Heart disease Mother    Hypertension Mother    Stroke Other    Heart disease Father    Hypertension Father    Kidney failure Sister    Asthma Sister    Past Surgical History:  Procedure Laterality Date   CARPAL TUNNEL RELEASE     CATHETER ABLATION  02/1997   @ [facility]  VEIN SURGERY BLADDER     Social History   Social History Narrative   Not on file    There is no immunization history on file for this patient.   Objective: Vital Signs: There were no vitals taken for this visit.   Physical Exam   Musculoskeletal Exam: ***  CDAI Exam: CDAI Score: -- Patient Global: --; Provider Global:  -- Swollen: --; Tender: -- Joint Exam 02/18/2020   No joint exam has been documented for this visit   There is currently no information documented on the homunculus. Go to the Rheumatology activity and complete the homunculus joint exam.  Investigation: No additional findings.  Imaging: DG Knee AP/LAT W/[place] Left  Result Date: 01/19/2020 Left knee 3 views left knee pain chronic Again we see medial lateral joint space narrowing mild secondary bone changes no major malalignment issues Impression moderate arthritis left knee  DG Knee AP/LAT W/[place] Right  Result Date: 01/19/2020 Chronic pain right knee 3 views right knee Medial and lateral joint space narrowing mild subchondral bone changes and secondary bone changes related around the joint possible chondrocalcinosis is noted. Moderate arthritis right knee   Recent Labs: Lab Results  Component Value Date   WBC 7.0 06/26/2018   HGB 12.0 06/26/2018   PLT 317 06/26/2018   NA 139 08/12/2018   K 3.2 (L) 08/12/2018   CL 104 08/12/2018   CO2 26 08/12/2018   GLUCOSE 96 08/12/2018   BUN 7 (L) 08/12/2018   CREATININE 0.97 08/12/2018   BILITOT 0.9 06/26/2018   AST 18 06/26/2018   ALT 18 06/26/2018   PROT 6.7 06/26/2018   CALCIUM 9.0 08/12/2018   GFRAA >60 08/12/2018    Speciality Comments: No specialty comments available.  Procedures:  No procedures performed Allergies: Adhesive [tape] and Codeine   Assessment / Plan:     Visit Diagnoses: No diagnosis found.  Orders: No orders of the defined types were placed in this encounter.  No orders of the defined types were placed in this encounter.   Face-to-face time spent with patient was *** minutes. Greater than 50% of time was spent in counseling and coordination of care.  Follow-Up Instructions: No follow-ups on file.   Earnestine Mealing, CMA  Note - This record has been created using Editor, commissioning.  Chart creation errors have been sought, but may not always   have been located. Such creation errors do not reflect on  the standard of medical care.

## 2020-02-18 ENCOUNTER — Ambulatory Visit: Payer: Medicare Other | Admitting: Physician Assistant

## 2020-02-26 NOTE — Progress Notes (Signed)
Office Visit Note  Patient: Dorothy Ford             Date of Birth: [birth year]           MRN: [id number]             PCP: Neale Burly, MD Referring: Neale Burly, MD Visit Date: 03/03/2020 Occupation: @GUAROCC @  Subjective:  Left knee pain and left shoulder joint pain   History of Present Illness: Dorothy Ford is a 69 y.o. female with history of pseudogout, fibromyalgia, and osteoarthritis.  She presents today with pain in multiple joints including the left shoulder joint, neck, lower back, and left knee joint.  She states several months ago she was experiencing severe pain in the left knee and has had difficulty ambulating at that time.  She states that she was taking oxycodone for pain relief and using Voltaren gel topically 4 times a day.  She states that overall her discomfort has been more tolerable recently but she continues to have difficulty climbing steps and getting up from a seated position.  She would like a cortisone injection today.  She continues to have generalized myalgias and muscle tenderness due to underlying fibromyalgia.  She has chronic fatigue secondary to insomnia.  She continues to take oxycodone as needed for pain relief.   Activities of Daily Living:  Patient reports morning stiffness for 3 hours.   Patient Denies nocturnal pain.  Difficulty dressing/grooming: Reports Difficulty climbing stairs: Reports Difficulty getting out of chair: Reports Difficulty using hands for taps, buttons, cutlery, and/or writing: Reports  Review of Systems  Constitutional: Positive for fatigue.  HENT: Positive for mouth dryness. Negative for mouth sores and nose dryness.   Eyes: Positive for dryness. Negative for pain and visual disturbance.  Respiratory: Positive for shortness of breath (Hx of asthma). Negative for cough, hemoptysis and difficulty breathing.   Cardiovascular: Positive for swelling in legs/feet. Negative for chest pain, palpitations and  hypertension.  Gastrointestinal: Positive for nausea. Negative for blood in stool, constipation and diarrhea.  Genitourinary: Negative for difficulty urinating and painful urination.  Musculoskeletal: Positive for arthralgias, joint pain, joint swelling, muscle weakness, morning stiffness and muscle tenderness. Negative for myalgias and myalgias.  Skin: Positive for rash. Negative for color change, pallor, hair loss, nodules/bumps, skin tightness, ulcers and sensitivity to sunlight.  Allergic/Immunologic: Negative for susceptible to infections.  Neurological: Negative for dizziness, numbness and headaches.  Hematological: Negative for bruising/bleeding tendency and swollen glands.  Psychiatric/Behavioral: Positive for sleep disturbance. Negative for depressed mood. The patient is not nervous/anxious.     PMFS History:  Patient Active Problem List   Diagnosis Date Noted  . Ankle joint pain 12/07/2019  . Low back pain 12/07/2019  . Lumbar radiculopathy 12/07/2019  . Pain of left upper arm 12/07/2019  . Acute foot pain, left 04/15/2019  . Bursitis/tendonitis, shoulder 04/23/2018  . Inflammatory arthropathy 02/20/2018  . Shoulder pain 02/20/2018  . Pain in both knees 11/20/2017  . 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 Chondromalacia patellae, right knee 01/15/2017  . Osteoarthritis of both hands 07/24/2016  . DDD (degenerative disc disease), cervical 07/24/2016  . DDD (degenerative disc disease), lumbar 07/24/2016  . Osteoporosis 07/24/2016  . Glaucoma 07/24/2016  . Sleep apnea 07/24/2016  . Pseudogout 07/19/2016  . Primary osteoarthritis of both knees 07/19/2016  . Fibromyalgia 07/19/2016  . 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 Pulmonary embolism ([place]) 01/18/2012  . Anxiety 01/18/2012  . Syncope   . RHEUMATOID LUNG 12/30/2009  .  OBESITY, UNSPECIFIED 06/02/2009  . DEPRESSIVE DISORDER NOT ELSEWHERE CLASSIFIED 06/02/2009  . SUPRAVENTRICULAR TACHYCARDIA 06/02/2009  . Chronic  diastolic heart failure ([place]) 06/02/2009  . COPD 06/02/2009  . DIAPHRAGMAT HERN W/O MENTION OBSTRUCTION/GANGREN 06/02/2009  . PALPITATIONS 06/02/2009  . CHEST PAIN UNSPECIFIED 06/02/2009    Past Medical History:  Diagnosis Date  . Acid reflux   . Asthma   . Cardiac dysrhythmia, unspecified   . Carotid bruit    Bilateral  . Cataract   . Chest pain, unspecified   . Chronic airway obstruction, not elsewhere classified   . Chronic sinusitis   . Congestive heart failure, unspecified   . DDD (degenerative disc disease), cervical 07/24/2016  . DDD (degenerative disc disease), lumbar 07/24/2016  . Depressive disorder, not elsewhere classified   . 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 Edema    left leg  . Fibromyalgia   . High cholesterol   . History of rheumatoid arthritis   . Obesity, unspecified   . Obstructive lung disease (generalized) ([place])   . Osteoarthritis of both hands 07/24/2016  . Osteopenia   . Osteoporosis 07/24/2016  . Palpitations   . Pseudogout involving multiple joints   . SVT (supraventricular tachycardia) ([place])   . Syncope    admitted 09/2011  . Thyroid disease   . Unspecified essential hypertension     Family History  Problem Relation Age of Onset  . Heart disease Mother   . Hypertension Mother   . Stroke Other   . Heart disease Father   . Hypertension Father   . Kidney failure Sister   . Asthma Sister    Past Surgical History:  Procedure Laterality Date  . CARPAL TUNNEL RELEASE    . CATHETER ABLATION  02/1997   @ [place]     Social History   Social History Narrative  . Not on file   Immunization History  Administered Date(s) Administered  . Moderna SARS-COVID-2 Vaccination 10/08/2019, 11/06/2019     Objective: Vital Signs: BP 106/66 (BP Location: Right Arm, Patient Position: Sitting, Cuff Size: Normal)   Pulse 65   Resp 18   Ht 5\' 1"  (1.549 m)   Wt 206 lb 12.8 oz (93.8 kg)   BMI  39.07 kg/m    Physical Exam Vitals and nursing note reviewed.  Constitutional:      Appearance: She is well-developed.  HENT:     Head: Normocephalic and atraumatic.  Eyes:     Conjunctiva/sclera: Conjunctivae normal.  Pulmonary:     Effort: Pulmonary effort is normal.  Abdominal:     General: Bowel sounds are normal.     Palpations: Abdomen is soft.  Musculoskeletal:     Cervical back: Normal range of motion.  Lymphadenopathy:     Cervical: No cervical adenopathy.  Skin:    General: Skin is warm and dry.     Capillary Refill: Capillary refill takes less than 2 seconds.  Neurological:     Mental Status: She is alert and oriented to person, place, and time.  Psychiatric:        Behavior: Behavior normal.      Musculoskeletal Exam: C-spine limited range of motion with lateral rotation.  Thoracic kyphosis noted.  Right shoulder has full range of motion with no discomfort.  Left shoulder has slightly limited abduction and internal rotation with discomfort.  She has bilateral elbow joint contractures.  Wrist joints, MCPs, PIPs, DIPs have good range of motion  with no synovitis.  She has PIP and DIP thickening consistent with osteoarthritis of both hands.  Hip joints have good range of motion with no discomfort.  Right knee has good range of motion with no warmth or effusion.  She has painful range of motion of the left knee.  Warmth of the left knee joint was noted on exam.  Tenderness of the right ankle joint noted.  No tenderness or inflammation of the left ankle joint noted.   CDAI Exam: CDAI Score: -- Patient Global: --; Provider Global: -- Swollen: --; Tender: -- Joint Exam 03/03/2020   No joint exam has been documented for this visit   There is currently no information documented on the homunculus. Go to the Rheumatology activity and complete the homunculus joint exam.  Investigation: No additional findings.  Imaging: No results found.  Recent Labs: Lab Results   Component Value Date   WBC 7.0 06/26/2018   HGB 12.0 06/26/2018   PLT 317 06/26/2018   NA 139 08/12/2018   K 3.2 (L) 08/12/2018   CL 104 08/12/2018   CO2 26 08/12/2018   GLUCOSE 96 08/12/2018   BUN 7 (L) 08/12/2018   CREATININE 0.97 08/12/2018   BILITOT 0.9 06/26/2018   AST 18 06/26/2018   ALT 18 06/26/2018   PROT 6.7 06/26/2018   CALCIUM 9.0 08/12/2018   GFRAA >60 08/12/2018    Speciality Comments: No specialty comments available.  Procedures:  Large Joint Inj: L knee on 03/03/2020 10:45 AM Indications: pain Details: 27 G 1.5 in needle, medial approach  Arthrogram: No  Medications: 1.5 mL lidocaine 1 %; 40 mg triamcinolone acetonide 40 MG/ML Aspirate: 0 mL Outcome: tolerated well, no immediate complications Procedure, treatment alternatives, risks and benefits explained, specific risks discussed. Consent was given by the patient. Immediately prior to procedure a time out was called to verify the correct patient, procedure, equipment, support staff and site/side marked as required. Patient was prepped and draped in the usual sterile fashion.     Allergies: Adhesive [tape] and Codeine   Assessment / Plan:     Visit Diagnoses: Pseudogout - She presents today with warmth and pain in the left knee joint.  She has been taking Colcrys 0.6 mg 1 tablet by mouth daily.  She takes oxycodone as needed for pain relief and applies Voltaren gel topically as needed.  She is been having difficulty ambulating as well as climbing steps due to the increased pain in the left knee joint.  She requested a left knee joint cortisone injection.  She tolerated the procedure well.  The procedure note was completed above.  She was advised to notify us of her left knee joint persist or worsens.  Primary osteoarthritis of both hands: She has PIP and DIP thickening consistent with osteoarthritis of both hands.  No tenderness or inflammation was noted.  Joint protection and muscle strengthening were  discussed.  Primary osteoarthritis of both knees: She has been experiencing increased discomfort in the left knee joint.  Warmth of the left knee was noted on exam.  She had updated x-rays of both knees obtained on 01/19/2020 which revealed moderate osteoarthritis of both knees.  She requested a left knee joint cortisone injection today.  Procedure note was completed above.  She was encouraged to continue to use Voltaren gel topically as needed for pain relief.  She was advised to notify us if her discomfort persists or worsens.  She was given a handout of knee joint exercises to perform.  We discussed physical therapy for fall prevention and muscle strengthening but she declined at this time.  She plans on continuing to use her cane and walker to assist with ambulation.   Chronic left shoulder pain: She is been having increased discomfort in the left shoulder joint.  She has difficulty lying on her left side due to the discomfort.  She has slightly limited abduction and internal rotation with discomfort.  She declined updated x-rays and a cortisone injection today.  She would like to return in about 1 month to have a cortisone injection at that time.  She was given a handout of exercises to perform.  Chronic pain of left knee  DDD (degenerative disc disease), cervical: She has limited lateral rotation with discomfort.  No symptoms of radiculopathy at this time.  She continues to have chronic neck stiffness and was encouraged to perform neck exercises.  DDD (degenerative disc disease), lumbar: Chronic pain.  She takes oxycodone as needed for pain relief.  Fibromyalgia - She has generalized hyperalgesia and positive tender points on exam.  She continues to have generalized muscle aches and muscle tenderness due to underlying fibromyalgia.  She takes Oxycodone 5 mg po 1 tablet po prn for pain relief.  She continues have chronic fatigue secondary to insomnia.  We discussed the importance of regular exercise  but she has difficulty exercising due to her history of asthma and ongoing shortness of breath.   Osteopenia of multiple sites - DEXA on 02/18/17 BMD left femoral neck 0.898 and T-score -1.0.    Other medical conditions are listed as follows:   History of sleep apnea  History of COPD  History of CHF (congestive heart failure)  History of pulmonary embolism  History of glaucoma   Orders: Orders Placed This Encounter  Procedures  . Large Joint Inj   No orders of the defined types were placed in this encounter.   Follow-Up Instructions: Return in about 4 weeks (around 03/31/2020) for Pseudogout, Fibromyalgia.   Ofilia Neas, PA-C  Note - This record has been created using Dragon software.  Chart creation errors have been sought, but may not always  have been located. Such creation errors do not reflect on  the standard of medical care.

## 2020-03-03 ENCOUNTER — Other Ambulatory Visit: Payer: Self-pay

## 2020-03-03 ENCOUNTER — Encounter: Payer: Self-pay | Admitting: Physician Assistant

## 2020-03-03 ENCOUNTER — Other Ambulatory Visit (HOSPITAL_COMMUNITY): Payer: Self-pay | Admitting: Neurology

## 2020-03-03 ENCOUNTER — Ambulatory Visit (INDEPENDENT_AMBULATORY_CARE_PROVIDER_SITE_OTHER): Payer: Medicare Other | Admitting: Physician Assistant

## 2020-03-03 ENCOUNTER — Other Ambulatory Visit: Payer: Self-pay | Admitting: Neurology

## 2020-03-03 VITALS — BP 106/66 | HR 65 | Resp 18 | Ht 61.0 in | Wt 206.8 lb

## 2020-03-03 DIAGNOSIS — Z8679 Personal history of other diseases of the circulatory system: Secondary | ICD-10-CM

## 2020-03-03 DIAGNOSIS — M170 Bilateral primary osteoarthritis of knee: Secondary | ICD-10-CM

## 2020-03-03 DIAGNOSIS — M1120 Other chondrocalcinosis, unspecified site: Secondary | ICD-10-CM | POA: Diagnosis not present

## 2020-03-03 DIAGNOSIS — G8929 Other chronic pain: Secondary | ICD-10-CM

## 2020-03-03 DIAGNOSIS — M25562 Pain in left knee: Secondary | ICD-10-CM | POA: Diagnosis not present

## 2020-03-03 DIAGNOSIS — Z86711 Personal history of pulmonary embolism: Secondary | ICD-10-CM

## 2020-03-03 DIAGNOSIS — M5416 Radiculopathy, lumbar region: Secondary | ICD-10-CM

## 2020-03-03 DIAGNOSIS — M5136 Other intervertebral disc degeneration, lumbar region: Secondary | ICD-10-CM

## 2020-03-03 DIAGNOSIS — M8589 Other specified disorders of bone density and structure, multiple sites: Secondary | ICD-10-CM

## 2020-03-03 DIAGNOSIS — Z8669 Personal history of other diseases of the nervous system and sense organs: Secondary | ICD-10-CM

## 2020-03-03 DIAGNOSIS — M25512 Pain in left shoulder: Secondary | ICD-10-CM

## 2020-03-03 DIAGNOSIS — M5030 Other cervical disc degeneration, unspecified cervical region: Secondary | ICD-10-CM

## 2020-03-03 DIAGNOSIS — M19042 Primary osteoarthritis, left hand: Secondary | ICD-10-CM

## 2020-03-03 DIAGNOSIS — Z8709 Personal history of other diseases of the respiratory system: Secondary | ICD-10-CM

## 2020-03-03 DIAGNOSIS — M797 Fibromyalgia: Secondary | ICD-10-CM

## 2020-03-03 DIAGNOSIS — M19041 Primary osteoarthritis, right hand: Secondary | ICD-10-CM | POA: Diagnosis not present

## 2020-03-03 MED ORDER — LIDOCAINE HCL 1 % IJ SOLN
1.5000 mL | INTRAMUSCULAR | Status: AC | PRN
  Administered 2020-03-03: 1.5 mL

## 2020-03-03 MED ORDER — TRIAMCINOLONE ACETONIDE 40 MG/ML IJ SUSP
40.0000 mg | INTRAMUSCULAR | Status: AC | PRN
  Administered 2020-03-03: 40 mg via INTRA_ARTICULAR

## 2020-03-03 NOTE — Patient Instructions (Signed)
Journal for Nurse Practitioners, 15(4), 263-267. Retrieved June 09, 2018 from [email or web address]">  Knee Exercises Ask your health care provider which exercises are safe for you. Do exercises exactly as told by your health care provider and adjust them as directed. It is normal to feel mild stretching, pulling, tightness, or discomfort as you do these exercises. Stop right away if you feel sudden pain or your pain gets worse. Do not begin these exercises until told by your health care provider. Stretching and range-of-motion exercises These exercises warm up your muscles and joints and improve the movement and flexibility of your knee. These exercises also help to relieve pain and swelling. Knee extension, prone 1. Lie on your abdomen (prone position) on a bed. 2. Place your left / right knee just beyond the edge of the surface so your knee is not on the bed. You can put a towel under your left / right thigh just above your kneecap for comfort. 3. Relax your leg muscles and allow gravity to straighten your knee (extension). You should feel a stretch behind your left / right knee. 4. Hold this position for __________ seconds. 5. Scoot up so your knee is supported between repetitions. Repeat __________ times. Complete this exercise __________ times a day. Knee flexion, active  1. Lie on your back with both legs straight. If this causes back discomfort, bend your left / right knee so your foot is flat on the floor. 2. Slowly slide your left / right heel back toward your buttocks. Stop when you feel a gentle stretch in the front of your knee or thigh (flexion). 3. Hold this position for __________ seconds. 4. Slowly slide your left / right heel back to the starting position. Repeat __________ times. Complete this exercise __________ times a day. Quadriceps stretch, prone  1. Lie on your abdomen on a firm surface, such as a bed or padded floor. 2. Bend your left / right knee and hold  your ankle. If you cannot reach your ankle or pant leg, loop a belt around your foot and grab the belt instead. 3. Gently pull your heel toward your buttocks. Your knee should not slide out to the side. You should feel a stretch in the front of your thigh and knee (quadriceps). 4. Hold this position for __________ seconds. Repeat __________ times. Complete this exercise __________ times a day. Hamstring, supine 1. Lie on your back (supine position). 2. Loop a belt or towel over the ball of your left / right foot. The ball of your foot is on the walking surface, right under your toes. 3. Straighten your left / right knee and slowly pull on the belt to raise your leg until you feel a gentle stretch behind your knee (hamstring). ? Do not let your knee bend while you do this. ? Keep your other leg flat on the floor. 4. Hold this position for __________ seconds. Repeat __________ times. Complete this exercise __________ times a day. Strengthening exercises These exercises build strength and endurance in your knee. Endurance is the ability to use your muscles for a long time, even after they get tired. Quadriceps, isometric This exercise stretches the muscles in front of your thigh (quadriceps) without moving your knee joint (isometric). 1. Lie on your back with your left / right leg extended and your other knee bent. Put a rolled towel or small pillow under your knee if told by your health care provider. 2. Slowly tense the muscles in the front of your left /   right thigh. You should see your kneecap slide up toward your hip or see increased dimpling just above the knee. This motion will push the back of the knee toward the floor. 3. For __________ seconds, hold the muscle as tight as you can without increasing your pain. 4. Relax the muscles slowly and completely. Repeat __________ times. Complete this exercise __________ times a day. Straight leg raises This exercise stretches the muscles in front  of your thigh (quadriceps) and the muscles that move your hips (hip flexors). 1. Lie on your back with your left / right leg extended and your other knee bent. 2. Tense the muscles in the front of your left / right thigh. You should see your kneecap slide up or see increased dimpling just above the knee. Your thigh may even shake a bit. 3. Keep these muscles tight as you raise your leg 4-6 inches (10-15 cm) off the floor. Do not let your knee bend. 4. Hold this position for __________ seconds. 5. Keep these muscles tense as you lower your leg. 6. Relax your muscles slowly and completely after each repetition. Repeat __________ times. Complete this exercise __________ times a day. Hamstring, isometric 1. Lie on your back on a firm surface. 2. Bend your left / right knee about __________ degrees. 3. Dig your left / right heel into the surface as if you are trying to pull it toward your buttocks. Tighten the muscles in the back of your thighs (hamstring) to "dig" as hard as you can without increasing any pain. 4. Hold this position for __________ seconds. 5. Release the tension gradually and allow your muscles to relax completely for __________ seconds after each repetition. Repeat __________ times. Complete this exercise __________ times a day. Hamstring curls If told by your health care provider, do this exercise while wearing ankle weights. Begin with __________ lb weights. Then increase the weight by 1 lb (0.5 kg) increments. Do not wear ankle weights that are more than __________ lb. 1. Lie on your abdomen with your legs straight. 2. Bend your left / right knee as far as you can without feeling pain. Keep your hips flat against the floor. 3. Hold this position for __________ seconds. 4. Slowly lower your leg to the starting position. Repeat __________ times. Complete this exercise __________ times a day. Squats This exercise strengthens the muscles in front of your thigh and knee  (quadriceps). 1. Stand in front of a table, with your feet and knees pointing straight ahead. You may rest your hands on the table for balance but not for support. 2. Slowly bend your knees and lower your hips like you are going to sit in a chair. ? Keep your weight over your heels, not over your toes. ? Keep your lower legs upright so they are parallel with the table legs. ? Do not let your hips go lower than your knees. ? Do not bend lower than told by your health care provider. ? If your knee pain increases, do not bend as low. 3. Hold the squat position for __________ seconds. 4. Slowly push with your legs to return to standing. Do not use your hands to pull yourself to standing. Repeat __________ times. Complete this exercise __________ times a day. Wall slides This exercise strengthens the muscles in front of your thigh and knee (quadriceps). 1. Lean your back against a smooth wall or door, and walk your feet out 18-24 inches (46-61 cm) from it. 2. Place your feet hip-width apart. 3.   Slowly slide down the wall or door until your knees bend __________ degrees. Keep your knees over your heels, not over your toes. Keep your knees in line with your hips. 4. Hold this position for __________ seconds. Repeat __________ times. Complete this exercise __________ times a day. Straight leg raises This exercise strengthens the muscles that rotate the leg at the hip and move it away from your body (hip abductors). 1. Lie on your side with your left / right leg in the top position. Lie so your head, shoulder, knee, and hip line up. You may bend your bottom knee to help you keep your balance. 2. Roll your hips slightly forward so your hips are stacked directly over each other and your left / right knee is facing forward. 3. Leading with your heel, lift your top leg 4-6 inches (10-15 cm). You should feel the muscles in your outer hip lifting. ? Do not let your foot drift forward. ? Do not let your knee  roll toward the ceiling. 4. Hold this position for __________ seconds. 5. Slowly return your leg to the starting position. 6. Let your muscles relax completely after each repetition. Repeat __________ times. Complete this exercise __________ times a day. Straight leg raises This exercise stretches the muscles that move your hips away from the front of the pelvis (hip extensors). 1. Lie on your abdomen on a firm surface. You can put a pillow under your hips if that is more comfortable. 2. Tense the muscles in your buttocks and lift your left / right leg about 4-6 inches (10-15 cm). Keep your knee straight as you lift your leg. 3. Hold this position for __________ seconds. 4. Slowly lower your leg to the starting position. 5. Let your leg relax completely after each repetition. Repeat __________ times. Complete this exercise __________ times a day. This information is not intended to replace advice given to you by your health care provider. Make sure you discuss any questions you have with your health care provider. Document Revised: 06/10/2018 Document Reviewed: 06/10/2018 [employer] Patient Education  2020 [employer] Shoulder Exercises Ask your health care provider which exercises are safe for you. Do exercises exactly as told by your health care provider and adjust them as directed. It is normal to feel mild stretching, pulling, tightness, or discomfort as you do these exercises. Stop right away if you feel sudden pain or your pain gets worse. Do not begin these exercises until told by your health care provider. Stretching exercises External rotation and abduction This exercise is sometimes called corner stretch. This exercise rotates your arm outward (external rotation) and moves your arm out from your body (abduction). 6. Stand in a doorway with one of your feet slightly in front of the other. This is called a staggered stance. If you cannot reach your forearms to the door frame, stand  facing a corner of a room. 7. Choose one of the following positions as told by your health care provider: ? Place your hands and forearms on the door frame above your head. ? Place your hands and forearms on the door frame at the height of your head. ? Place your hands on the door frame at the height of your elbows. 8. Slowly move your weight onto your front foot until you feel a stretch across your chest and in the front of your shoulders. Keep your head and chest upright and keep your abdominal muscles tight. 9. Hold for __________ seconds. 10. To release the stretch,   shift your weight to your back foot. Repeat __________ times. Complete this exercise __________ times a day. Extension, standing 5. Stand and hold a broomstick, a cane, or a similar object behind your back. ? Your hands should be a little wider than shoulder width apart. ? Your palms should face away from your back. 6. Keeping your elbows straight and your shoulder muscles relaxed, move the stick away from your body until you feel a stretch in your shoulders (extension). ? Avoid shrugging your shoulders while you move the stick. Keep your shoulder blades tucked down toward the middle of your back. 7. Hold for __________ seconds. 8. Slowly return to the starting position. Repeat __________ times. Complete this exercise __________ times a day. Range-of-motion exercises Pendulum  5. Stand near a wall or a surface that you can hold onto for balance. 6. Bend at the waist and let your left / right arm hang straight down. Use your other arm to support you. Keep your back straight and do not lock your knees. 7. Relax your left / right arm and shoulder muscles, and move your hips and your trunk so your left / right arm swings freely. Your arm should swing because of the motion of your body, not because you are using your arm or shoulder muscles. 8. Keep moving your hips and trunk so your arm swings in the following directions, as told  by your health care provider: ? Side to side. ? Forward and backward. ? In clockwise and counterclockwise circles. 9. Continue each motion for __________ seconds, or for as long as told by your health care provider. 10. Slowly return to the starting position. Repeat __________ times. Complete this exercise __________ times a day. Shoulder flexion, standing  5. Stand and hold a broomstick, a cane, or a similar object. Place your hands a little more than shoulder width apart on the object. Your left / right hand should be palm up, and your other hand should be palm down. 6. Keep your elbow straight and your shoulder muscles relaxed. Push the stick up with your healthy arm to raise your left / right arm in front of your body, and then over your head until you feel a stretch in your shoulder (flexion). ? Avoid shrugging your shoulder while you raise your arm. Keep your shoulder blade tucked down toward the middle of your back. 7. Hold for __________ seconds. 8. Slowly return to the starting position. Repeat __________ times. Complete this exercise __________ times a day. Shoulder abduction, standing 5. Stand and hold a broomstick, a cane, or a similar object. Place your hands a little more than shoulder width apart on the object. Your left / right hand should be palm up, and your other hand should be palm down. 6. Keep your elbow straight and your shoulder muscles relaxed. Push the object across your body toward your left / right side. Raise your left / right arm to the side of your body (abduction) until you feel a stretch in your shoulder. ? Do not raise your arm above shoulder height unless your health care provider tells you to do that. ? If directed, raise your arm over your head. ? Avoid shrugging your shoulder while you raise your arm. Keep your shoulder blade tucked down toward the middle of your back. 7. Hold for __________ seconds. 8. Slowly return to the starting position. Repeat  __________ times. Complete this exercise __________ times a day. Internal rotation  7. Place your left / right hand behind   your back, palm up. 8. Use your other hand to dangle an exercise band, a towel, or a similar object over your shoulder. Grasp the band with your left / right hand so you are holding on to both ends. 9. Gently pull up on the band until you feel a stretch in the front of your left / right shoulder. The movement of your arm toward the center of your body is called internal rotation. ? Avoid shrugging your shoulder while you raise your arm. Keep your shoulder blade tucked down toward the middle of your back. 10. Hold for __________ seconds. 11. Release the stretch by letting go of the band and lowering your hands. Repeat __________ times. Complete this exercise __________ times a day. Strengthening exercises External rotation  6. Sit in a stable chair without armrests. 7. Secure an exercise band to a stable object at elbow height on your left / right side. 8. Place a soft object, such as a folded towel or a small pillow, between your left / right upper arm and your body to move your elbow about 4 inches (10 cm) away from your side. 9. Hold the end of the exercise band so it is tight and there is no slack. 10. Keeping your elbow pressed against the soft object, slowly move your forearm out, away from your abdomen (external rotation). Keep your body steady so only your forearm moves. 11. Hold for __________ seconds. 12. Slowly return to the starting position. Repeat __________ times. Complete this exercise __________ times a day. Shoulder abduction  5. Sit in a stable chair without armrests, or stand up. 6. Hold a __________ weight in your left / right hand, or hold an exercise band with both hands. 7. Start with your arms straight down and your left / right palm facing in, toward your body. 8. Slowly lift your left / right hand out to your side (abduction). Do not lift your  hand above shoulder height unless your health care provider tells you that this is safe. ? Keep your arms straight. ? Avoid shrugging your shoulder while you do this movement. Keep your shoulder blade tucked down toward the middle of your back. 9. Hold for __________ seconds. 10. Slowly lower your arm, and return to the starting position. Repeat __________ times. Complete this exercise __________ times a day. Shoulder extension 5. Sit in a stable chair without armrests, or stand up. 6. Secure an exercise band to a stable object in front of you so it is at shoulder height. 7. Hold one end of the exercise band in each hand. Your palms should face each other. 8. Straighten your elbows and lift your hands up to shoulder height. 9. Step back, away from the secured end of the exercise band, until the band is tight and there is no slack. 10. Squeeze your shoulder blades together as you pull your hands down to the sides of your thighs (extension). Stop when your hands are straight down by your sides. Do not let your hands go behind your body. 11. Hold for __________ seconds. 12. Slowly return to the starting position. Repeat __________ times. Complete this exercise __________ times a day. Shoulder row 5. Sit in a stable chair without armrests, or stand up. 6. Secure an exercise band to a stable object in front of you so it is at waist height. 7. Hold one end of the exercise band in each hand. Position your palms so that your thumbs are facing the ceiling (neutral position). 8.   Bend each of your elbows to a 90-degree angle (right angle) and keep your upper arms at your sides. 9. Step back until the band is tight and there is no slack. 10. Slowly pull your elbows back behind you. 11. Hold for __________ seconds. 12. Slowly return to the starting position. Repeat __________ times. Complete this exercise __________ times a day. Shoulder press-ups  7. Sit in a stable chair that has armrests. Sit  upright, with your feet flat on the floor. 8. Put your hands on the armrests so your elbows are bent and your fingers are pointing forward. Your hands should be about even with the sides of your body. 9. Push down on the armrests and use your arms to lift yourself off the chair. Straighten your elbows and lift yourself up as much as you comfortably can. ? Move your shoulder blades down, and avoid letting your shoulders move up toward your ears. ? Keep your feet on the ground. As you get stronger, your feet should support less of your body weight as you lift yourself up. 10. Hold for __________ seconds. 11. Slowly lower yourself back into the chair. Repeat __________ times. Complete this exercise __________ times a day. Wall push-ups  6. Stand so you are facing a stable wall. Your feet should be about one arm-length away from the wall. 7. Lean forward and place your palms on the wall at shoulder height. 8. Keep your feet flat on the floor as you bend your elbows and lean forward toward the wall. 9. Hold for __________ seconds. 10. Straighten your elbows to push yourself back to the starting position. Repeat __________ times. Complete this exercise __________ times a day. This information is not intended to replace advice given to you by your health care provider. Make sure you discuss any questions you have with your health care provider. Document Revised: 12/12/2018 Document Reviewed: 09/19/2018 [employer] Patient Education  2020 [employer]  

## 2020-03-18 NOTE — Progress Notes (Deleted)
Office Visit Note  Patient: Dorothy Ford             Date of Birth: [birth year]           MRN: [id number]             PCP: Neale Burly, MD Referring: Neale Burly, MD Visit Date: 03/31/2020 Occupation: @GUAROCC @  Subjective:  No chief complaint on file.   History of Present Illness: Dorothy Ford is a 68 y.o. female ***   Activities of Daily Living:  Patient reports morning stiffness for *** {minute/hour:19697}.   Patient {ACTIONS;DENIES/REPORTS:21021675::"Denies"} nocturnal pain.  Difficulty dressing/grooming: {ACTIONS;DENIES/REPORTS:21021675::"Denies"} Difficulty climbing stairs: {ACTIONS;DENIES/REPORTS:21021675::"Denies"} Difficulty getting out of chair: {ACTIONS;DENIES/REPORTS:21021675::"Denies"} Difficulty using hands for taps, buttons, cutlery, and/or writing: {ACTIONS;DENIES/REPORTS:21021675::"Denies"}  No Rheumatology ROS completed.   PMFS History:  Patient Active Problem List   Diagnosis Date Noted  . Ankle joint pain 12/07/2019  . Low back pain 12/07/2019  . Lumbar radiculopathy 12/07/2019  . Pain of left upper arm 12/07/2019  . Acute foot pain, left 04/15/2019  . Bursitis/tendonitis, shoulder 04/23/2018  . Inflammatory arthropathy 02/20/2018  . Shoulder pain 02/20/2018  . Pain in both knees 11/20/2017  . 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 Chondromalacia patellae, right knee 01/15/2017  . Osteoarthritis of both hands 07/24/2016  . DDD (degenerative disc disease), cervical 07/24/2016  . DDD (degenerative disc disease), lumbar 07/24/2016  . Osteoporosis 07/24/2016  . Glaucoma 07/24/2016  . Sleep apnea 07/24/2016  . Pseudogout 07/19/2016  . Primary osteoarthritis of both knees 07/19/2016  . Fibromyalgia 07/19/2016  . 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 Pulmonary embolism ([place]) 01/18/2012  . Anxiety 01/18/2012  . Syncope   . RHEUMATOID LUNG 12/30/2009  . OBESITY, UNSPECIFIED 06/02/2009  . DEPRESSIVE DISORDER NOT ELSEWHERE  CLASSIFIED 06/02/2009  . SUPRAVENTRICULAR TACHYCARDIA 06/02/2009  . Chronic diastolic heart failure ([place]) 06/02/2009  . COPD 06/02/2009  . DIAPHRAGMAT HERN W/O MENTION OBSTRUCTION/GANGREN 06/02/2009  . PALPITATIONS 06/02/2009  . CHEST PAIN UNSPECIFIED 06/02/2009    Past Medical History:  Diagnosis Date  . Acid reflux   . Asthma   . Cardiac dysrhythmia, unspecified   . Carotid bruit    Bilateral  . Cataract   . Chest pain, unspecified   . Chronic airway obstruction, not elsewhere classified   . Chronic sinusitis   . Congestive heart failure, unspecified   . DDD (degenerative disc disease), cervical 07/24/2016  . DDD (degenerative disc disease), lumbar 07/24/2016  . Depressive disorder, not elsewhere classified   . 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 Edema    left leg  . Fibromyalgia   . High cholesterol   . History of rheumatoid arthritis   . Obesity, unspecified   . Obstructive lung disease (generalized) ([place])   . Osteoarthritis of both hands 07/24/2016  . Osteopenia   . Osteoporosis 07/24/2016  . Palpitations   . Pseudogout involving multiple joints   . SVT (supraventricular tachycardia) ([place])   . Syncope    admitted 09/2011  . Thyroid disease   . Unspecified essential hypertension     Family History  Problem Relation Age of Onset  . Heart disease Mother   . Hypertension Mother   . Stroke Other   . Heart disease Father   . Hypertension Father   . Kidney failure Sister   . Asthma Sister    Past Surgical History:  Procedure Laterality Date  . CARPAL TUNNEL RELEASE    . CATHETER ABLATION  02/1997   @  [place] [facility]     Social History   Social History Narrative  . Not on file   Immunization History  Administered Date(s) Administered  . Moderna SARS-COVID-2 Vaccination 10/08/2019, 11/06/2019     Objective: Vital Signs: There were no vitals taken for this visit.   Physical Exam   Musculoskeletal Exam:  ***  CDAI Exam: CDAI Score: -- Patient Global: --; Provider Global: -- Swollen: --; Tender: -- Joint Exam 03/31/2020   No joint exam has been documented for this visit   There is currently no information documented on the homunculus. Go to the Rheumatology activity and complete the homunculus joint exam.  Investigation: No additional findings.  Imaging: No results found.  Recent Labs: Lab Results  Component Value Date   WBC 7.0 06/26/2018   HGB 12.0 06/26/2018   PLT 317 06/26/2018   NA 139 08/12/2018   K 3.2 (L) 08/12/2018   CL 104 08/12/2018   CO2 26 08/12/2018   GLUCOSE 96 08/12/2018   BUN 7 (L) 08/12/2018   CREATININE 0.97 08/12/2018   BILITOT 0.9 06/26/2018   AST 18 06/26/2018   ALT 18 06/26/2018   PROT 6.7 06/26/2018   CALCIUM 9.0 08/12/2018   GFRAA >60 08/12/2018    Speciality Comments: No specialty comments available.  Procedures:  No procedures performed Allergies: Adhesive [tape] and Codeine   Assessment / Plan:     Visit Diagnoses: No diagnosis found.  Orders: No orders of the defined types were placed in this encounter.  No orders of the defined types were placed in this encounter.   Face-to-face time spent with patient was *** minutes. Greater than 50% of time was spent in counseling and coordination of care.  Follow-Up Instructions: No follow-ups on file.   Earnestine Mealing, CMA  Note - This record has been created using Editor, commissioning.  Chart creation errors have been sought, but may not always  have been located. Such creation errors do not reflect on  the standard of medical care.

## 2020-03-31 ENCOUNTER — Ambulatory Visit: Payer: Medicare Other | Admitting: Physician Assistant

## 2020-04-01 ENCOUNTER — Ambulatory Visit (HOSPITAL_COMMUNITY): Payer: Medicare Other

## 2020-04-04 ENCOUNTER — Ambulatory Visit: Payer: Medicare Other | Admitting: Rheumatology

## 2020-04-08 NOTE — Progress Notes (Signed)
Office Visit Note  Patient: Dorothy Ford             Date of Birth: Jun 12, 1951           MRN: [id number]             PCP: Neale Burly, MD Referring: Neale Burly, MD Visit Date: 04/22/2020 Occupation: @GUAROCC @  Subjective:  Other (left shoulder pain- requested injection )   History of Present Illness: Dorothy Ford is a 69 y.o. female with history of osteoarthritis, degenerative disc disease and chondrocalcinosis.  She denies having any chondrocalcinosis flare.  Left shoulder has been bothering her a lot.  She is having difficulty raising her left arm.  She continues to have some discomfort in her hands and her knee joints.  She states she was diagnosed with osteoporosis and started taking alendronate.  She continues to have some discomfort in her neck and lower back.  Fibromyalgia flares also cause discomfort.  Activities of Daily Living:  Patient reports morning stiffness for several  hours.   Patient Reports nocturnal pain.  Difficulty dressing/grooming: Reports Difficulty climbing stairs: Reports Difficulty getting out of chair: Reports Difficulty using hands for taps, buttons, cutlery, and/or writing: Reports  Review of Systems  Constitutional: Positive for fatigue.  HENT: Negative for mouth sores, mouth dryness and nose dryness.   Eyes: Negative for itching and dryness.  Respiratory: Positive for shortness of breath and difficulty breathing.   Cardiovascular: Negative for chest pain and palpitations.  Gastrointestinal: Negative for blood in stool, constipation and diarrhea.  Endocrine: Negative for increased urination.  Genitourinary: Positive for vaginal bleeding. Negative for difficulty urinating.  Musculoskeletal: Positive for arthralgias, joint pain, joint swelling, myalgias, morning stiffness, muscle tenderness and myalgias.  Skin: Negative for color change, rash and redness.  Allergic/Immunologic: Negative for susceptible to infections.    Neurological: Positive for numbness and parasthesias. Negative for dizziness, headaches, memory loss and weakness.  Hematological: Positive for bruising/bleeding tendency.  Psychiatric/Behavioral: Negative for confusion.    PMFS History:  Patient Active Problem List   Diagnosis Date Noted  . Ankle joint pain 12/07/2019  . Low back pain 12/07/2019  . Lumbar radiculopathy 12/07/2019  . Pain of left upper arm 12/07/2019  . Acute foot pain, left 04/15/2019  . Bursitis/tendonitis, shoulder 04/23/2018  . Inflammatory arthropathy 02/20/2018  . Shoulder pain 02/20/2018  . Pain in both knees 11/20/2017  . 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 Chondromalacia patellae, right knee 01/15/2017  . Osteoarthritis of both hands 07/24/2016  . DDD (degenerative disc disease), cervical 07/24/2016  . DDD (degenerative disc disease), lumbar 07/24/2016  . Osteoporosis 07/24/2016  . Glaucoma 07/24/2016  . Sleep apnea 07/24/2016  . Pseudogout 07/19/2016  . Primary osteoarthritis of both knees 07/19/2016  . Fibromyalgia 07/19/2016  . 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 Pulmonary embolism ([place]) 01/18/2012  . Anxiety 01/18/2012  . Syncope   . RHEUMATOID LUNG 12/30/2009  . OBESITY, UNSPECIFIED 06/02/2009  . DEPRESSIVE DISORDER NOT ELSEWHERE CLASSIFIED 06/02/2009  . SUPRAVENTRICULAR TACHYCARDIA 06/02/2009  . Chronic diastolic heart failure ([place]) 06/02/2009  . COPD 06/02/2009  . DIAPHRAGMAT HERN W/O MENTION OBSTRUCTION/GANGREN 06/02/2009  . PALPITATIONS 06/02/2009  . CHEST PAIN UNSPECIFIED 06/02/2009    Past Medical History:  Diagnosis Date  . Acid reflux   . Asthma   . Cardiac dysrhythmia, unspecified   . Carotid bruit    Bilateral  . Cataract   . Chest pain, unspecified   . Chronic airway obstruction, not elsewhere classified   .  Chronic sinusitis   . Congestive heart failure, unspecified   . DDD (degenerative disc disease), cervical 07/24/2016  . DDD (degenerative disc disease),  lumbar 07/24/2016  . Depressive disorder, not elsewhere classified   . 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 Edema    left leg  . Fibromyalgia   . High cholesterol   . History of rheumatoid arthritis   . Obesity, unspecified   . Obstructive lung disease (generalized) ([place])   . Osteoarthritis of both hands 07/24/2016  . Osteopenia   . Osteoporosis 07/24/2016  . Palpitations   . Pseudogout involving multiple joints   . SVT (supraventricular tachycardia) ([place])   . Syncope    admitted 09/2011  . Thyroid disease   . Unspecified essential hypertension     Family History  Problem Relation Age of Onset  . Heart disease Mother   . Hypertension Mother   . Stroke Other   . Heart disease Father   . Hypertension Father   . Kidney failure Sister   . Asthma Sister    Past Surgical History:  Procedure Laterality Date  . CARPAL TUNNEL RELEASE    . CATHETER ABLATION  02/1997   @ [place]     Social History   Social History Narrative  . Not on file   Immunization History  Administered Date(s) Administered  . Moderna SARS-COVID-2 Vaccination 10/08/2019, 11/06/2019     Objective: Vital Signs: BP 135/74 (BP Location: Right Arm, Patient Position: Sitting, Cuff Size: Normal)   Pulse (!) 57   Resp 15   Ht 5\' 1"  (1.549 m)   Wt 206 lb (93.4 kg)   BMI 38.92 kg/m    Physical Exam Vitals and nursing note reviewed.  Constitutional:      Appearance: She is well-developed.  HENT:     Head: Normocephalic and atraumatic.  Eyes:     Conjunctiva/sclera: Conjunctivae normal.  Cardiovascular:     Rate and Rhythm: Normal rate and regular rhythm.     Heart sounds: Normal heart sounds.  Pulmonary:     Effort: Pulmonary effort is normal.     Breath sounds: Normal breath sounds.  Abdominal:     General: Bowel sounds are normal.     Palpations: Abdomen is soft.  Musculoskeletal:     Cervical back: Normal range of motion.    Lymphadenopathy:     Cervical: No cervical adenopathy.  Skin:    General: Skin is warm and dry.     Capillary Refill: Capillary refill takes less than 2 seconds.  Neurological:     Mental Status: She is alert and oriented to person, place, and time.  Psychiatric:        Behavior: Behavior normal.      Musculoskeletal Exam: C-spine was in good range of motion.  She has painful abduction of her left shoulder joint with limitation to 90 degrees.  Elbow joints and wrist joints with good range of motion.  But she has bilateral PIP and DIP thickening in her hands and feet.  She had good range of motion of her hip joints and her knee joints.  She had painful range of motion of bilateral knee joints.  CDAI Exam: CDAI Score: -- Patient Global: --; Provider Global: -- Swollen: --; Tender: -- Joint Exam 04/22/2020   No joint exam has been documented for this visit   There is currently no information documented on the homunculus. Go to the Rheumatology activity and  complete the homunculus joint exam.  Investigation: No additional findings.  Imaging: MR LUMBAR SPINE WO CONTRAST  Result Date: 04/20/2020 CLINICAL DATA:  Lumbar radiculopathy. Additional history provided by scanning technologist: Patient reports low back pain radiating to left leg for 3 weeks EXAM: MRI LUMBAR SPINE WITHOUT CONTRAST TECHNIQUE: Multiplanar, multisequence MR imaging of the lumbar spine was performed. No intravenous contrast was administered. COMPARISON:  Lumbar spine radiographs 09/20/2019 FINDINGS: Segmentation: For the purposes of this dictation, five lumbar vertebrae are assumed and the caudal most well-formed intervertebral disc is designated L5-S1. Alignment: Grade 1 retrolisthesis at L1-L2, L2-L3 and L3-L4. Trace L4-L5 grade 1 anterolisthesis. Vertebrae: Vertebral body height is maintained. Multilevel degenerative plate irregularity, fatty degenerative endplate marrow signal and small Schmorl nodes. Additionally,  there is trace degenerative endplate edema at T7-S1. Conus medullaris and cauda equina: Conus extends to the L1 level. No signal abnormality within the visualized distal spinal cord Paraspinal and other soft tissues: No abnormality identified within included portions of the abdomen/retroperitoneum. Atrophy of the lumbar paraspinal musculature. Disc levels: Disc degeneration throughout the lumbar and visualized lower thoracic spine. Most notably, there is moderate/advanced disc degeneration at L1-L2, L2-L3 and L3-L4. T11-T12: Mild facet arthrosis/ligamentum flavum hypertrophy. Open disc T12-L1: Mild facet arthrosis. No significant disc herniation or stenosis. L1-L2: Grade 1 retrolisthesis. Disc uncovering with disc bulge and endplate spurring. Mild facet arthrosis. Mild bilateral subarticular and central canal narrowing. Mild left neural foraminal narrowing. L2-L3: Grade 1 retrolisthesis. Disc uncovering with disc bulge. Superimposed tiny central disc protrusion. Mild facet arthrosis. No significant spinal canal stenosis. Mild relative left neural foraminal narrowing. L3-L4: Grade 1 retrolisthesis. Disc bulge. Mild-to-moderate facet arthrosis. Mild right subarticular narrowing without frank nerve root impingement. Central canal patent. Mild right neural foraminal narrowing. L4-L5: Grade 1 anterolisthesis. Disc bulge. Superimposed shallow left foraminal disc protrusion. Moderate/advanced facet arthrosis with ligamentum flavum hypertrophy. Moderate to moderately advanced spinal canal stenosis with bilateral subarticular narrowing. No significant foraminal stenosis. L5-S1: Moderate facet arthrosis. No significant disc herniation, spinal canal stenosis or neural foraminal narrowing. IMPRESSION: Lumbar spondylosis as outlined and most notably as follows. At L4-L5, there is mild disc degeneration. Disc uncovering with disc bulge. Superimposed shallow left foraminal disc protrusion. Moderate/advanced facet arthrosis with  ligamentum flavum hypertrophy. Moderate to moderately advanced spinal canal stenosis with bilateral subarticular narrowing. No significant foraminal stenosis. No more than mild spinal canal or neural foraminal narrowing at the remaining levels. Of note, disc degeneration is moderate/severe at L1-L2, L2-L3 and L3-L4. Mild multilevel spondylolisthesis as described. Electronically Signed   By: Kellie Simmering DO   On: 04/20/2020 09:55    Recent Labs: Lab Results  Component Value Date   WBC 7.0 06/26/2018   HGB 12.0 06/26/2018   PLT 317 06/26/2018   NA 139 08/12/2018   K 3.2 (L) 08/12/2018   CL 104 08/12/2018   CO2 26 08/12/2018   GLUCOSE 96 08/12/2018   BUN 7 (L) 08/12/2018   CREATININE 0.97 08/12/2018   BILITOT 0.9 06/26/2018   AST 18 06/26/2018   ALT 18 06/26/2018   PROT 6.7 06/26/2018   CALCIUM 9.0 08/12/2018   GFRAA >60 08/12/2018    Speciality Comments: No specialty comments available.  Procedures:  Large Joint Inj: L subacromial bursa on 04/22/2020 9:49 AM Indications: pain Details: 27 G 1.5 in needle, posterior approach  Arthrogram: No  Medications: 1 mL lidocaine 1 %; 40 mg triamcinolone acetonide 40 MG/ML Aspirate: 0 mL Outcome: tolerated well, no immediate complications Procedure, treatment alternatives, risks and  benefits explained, specific risks discussed. Consent was given by the patient. Immediately prior to procedure a time out was called to verify the correct patient, procedure, equipment, support staff and site/side marked as required. Patient was prepped and draped in the usual sterile fashion.     Allergies: Adhesive [tape] and Codeine   Assessment / Plan:     Visit Diagnoses: Pseudogout -patient denies having any flares of pseudogout.  Colcrys 0.6 mg 1 tablet by mouth daily on as needed basis.  Chronic left shoulder pain-she had subacromial bursitis and large discomfort abducting her left arm.  After informed consent was obtained per her request left  subacromial bursa was injected as described above.  She had immediate relief after the injection and her abduction improved.  Primary osteoarthritis of both hands-joint protection muscle strengthening was discussed.  Primary osteoarthritis of both knees-she continues to have some discomfort in her knee joints.  Weight loss diet and exercise was emphasized.  DDD (degenerative disc disease), cervical-she has good range of motion with discomfort.  DDD (degenerative disc disease), lumbar-she had limited range of motion and difficulty walking.  She uses a cane.  Fibromyalgia-she continues to have generalized pain and discomfort from fibromyalgia.  Age-related osteoporosis without current pathological fracture -patient states she was diagnosed with osteoporosis recently based on the DEXA scan.  She is on alendronate, calcium and vitamin D  History of sleep apnea  History of COPD  History of CHF (congestive heart failure)  History of pulmonary embolism  History of glaucoma  Orders: Orders Placed This Encounter  Procedures  . Large Joint Inj   No orders of the defined types were placed in this encounter.    Follow-Up Instructions: Return in about 6 months (around 10/23/2020) for Osteoarthritis CPPD, osteoporosis.   Bo Merino, MD  Note - This record has been created using Editor, commissioning.  Chart creation errors have been sought, but may not always  have been located. Such creation errors do not reflect on  the standard of medical care.

## 2020-04-20 ENCOUNTER — Ambulatory Visit (HOSPITAL_COMMUNITY)
Admission: RE | Admit: 2020-04-20 | Discharge: 2020-04-20 | Disposition: A | Discharge status: Home / Self Care | Payer: Medicare Other | Source: Ambulatory Visit | Attending: Neurology | Admitting: Neurology

## 2020-04-20 ENCOUNTER — Other Ambulatory Visit: Payer: Self-pay

## 2020-04-20 DIAGNOSIS — M5416 Radiculopathy, lumbar region: Secondary | ICD-10-CM | POA: Diagnosis present

## 2020-04-22 ENCOUNTER — Encounter: Payer: Self-pay | Admitting: Rheumatology

## 2020-04-22 ENCOUNTER — Other Ambulatory Visit: Payer: Self-pay

## 2020-04-22 ENCOUNTER — Ambulatory Visit (INDEPENDENT_AMBULATORY_CARE_PROVIDER_SITE_OTHER): Payer: Medicare Other | Admitting: Rheumatology

## 2020-04-22 VITALS — BP 135/74 | HR 57 | Resp 15 | Ht 61.0 in | Wt 206.0 lb

## 2020-04-22 DIAGNOSIS — M5030 Other cervical disc degeneration, unspecified cervical region: Secondary | ICD-10-CM | POA: Diagnosis not present

## 2020-04-22 DIAGNOSIS — Z8709 Personal history of other diseases of the respiratory system: Secondary | ICD-10-CM

## 2020-04-22 DIAGNOSIS — Z8669 Personal history of other diseases of the nervous system and sense organs: Secondary | ICD-10-CM

## 2020-04-22 DIAGNOSIS — Z86711 Personal history of pulmonary embolism: Secondary | ICD-10-CM

## 2020-04-22 DIAGNOSIS — G8929 Other chronic pain: Secondary | ICD-10-CM | POA: Diagnosis not present

## 2020-04-22 DIAGNOSIS — M797 Fibromyalgia: Secondary | ICD-10-CM

## 2020-04-22 DIAGNOSIS — M170 Bilateral primary osteoarthritis of knee: Secondary | ICD-10-CM

## 2020-04-22 DIAGNOSIS — M19042 Primary osteoarthritis, left hand: Secondary | ICD-10-CM

## 2020-04-22 DIAGNOSIS — M810 Age-related osteoporosis without current pathological fracture: Secondary | ICD-10-CM

## 2020-04-22 DIAGNOSIS — M19041 Primary osteoarthritis, right hand: Secondary | ICD-10-CM

## 2020-04-22 DIAGNOSIS — M1120 Other chondrocalcinosis, unspecified site: Secondary | ICD-10-CM

## 2020-04-22 DIAGNOSIS — M5136 Other intervertebral disc degeneration, lumbar region: Secondary | ICD-10-CM

## 2020-04-22 DIAGNOSIS — M25512 Pain in left shoulder: Secondary | ICD-10-CM

## 2020-04-22 DIAGNOSIS — Z8679 Personal history of other diseases of the circulatory system: Secondary | ICD-10-CM

## 2020-04-22 MED ORDER — LIDOCAINE HCL 1 % IJ SOLN
1.0000 mL | INTRAMUSCULAR | Status: AC | PRN
  Administered 2020-04-22: 1 mL

## 2020-04-22 MED ORDER — TRIAMCINOLONE ACETONIDE 40 MG/ML IJ SUSP
40.0000 mg | INTRAMUSCULAR | Status: AC | PRN
  Administered 2020-04-22: 40 mg via INTRA_ARTICULAR

## 2020-04-22 NOTE — Patient Instructions (Signed)
Shoulder Exercises Ask your health care provider which exercises are safe for you. Do exercises exactly as told by your health care provider and adjust them as directed. It is normal to feel mild stretching, pulling, tightness, or discomfort as you do these exercises. Stop right away if you feel sudden pain or your pain gets worse. Do not begin these exercises until told by your health care provider. Stretching exercises External rotation and abduction This exercise is sometimes called corner stretch. This exercise rotates your arm outward (external rotation) and moves your arm out from your body (abduction). 1. Stand in a doorway with one of your feet slightly in front of the other. This is called a staggered stance. If you cannot reach your forearms to the door frame, stand facing a corner of a room. 2. Choose one of the following positions as told by your health care provider: ? Place your hands and forearms on the door frame above your head. ? Place your hands and forearms on the door frame at the height of your head. ? Place your hands on the door frame at the height of your elbows. 3. Slowly move your weight onto your front foot until you feel a stretch across your chest and in the front of your shoulders. Keep your head and chest upright and keep your abdominal muscles tight. 4. Hold for __________ seconds. 5. To release the stretch, shift your weight to your back foot. Repeat __________ times. Complete this exercise __________ times a day. Extension, standing 1. Stand and hold a broomstick, a cane, or a similar object behind your back. ? Your hands should be a little wider than shoulder width apart. ? Your palms should face away from your back. 2. Keeping your elbows straight and your shoulder muscles relaxed, move the stick away from your body until you feel a stretch in your shoulders (extension). ? Avoid shrugging your shoulders while you move the stick. Keep your shoulder blades tucked  down toward the middle of your back. 3. Hold for __________ seconds. 4. Slowly return to the starting position. Repeat __________ times. Complete this exercise __________ times a day. Range-of-motion exercises Pendulum  1. Stand near a wall or a surface that you can hold onto for balance. 2. Bend at the waist and let your left / right arm hang straight down. Use your other arm to support you. Keep your back straight and do not lock your knees. 3. Relax your left / right arm and shoulder muscles, and move your hips and your trunk so your left / right arm swings freely. Your arm should swing because of the motion of your body, not because you are using your arm or shoulder muscles. 4. Keep moving your hips and trunk so your arm swings in the following directions, as told by your health care provider: ? Side to side. ? Forward and backward. ? In clockwise and counterclockwise circles. 5. Continue each motion for __________ seconds, or for as long as told by your health care provider. 6. Slowly return to the starting position. Repeat __________ times. Complete this exercise __________ times a day. Shoulder flexion, standing  1. Stand and hold a broomstick, a cane, or a similar object. Place your hands a little more than shoulder width apart on the object. Your left / right hand should be palm up, and your other hand should be palm down. 2. Keep your elbow straight and your shoulder muscles relaxed. Push the stick up with your healthy arm to   raise your left / right arm in front of your body, and then over your head until you feel a stretch in your shoulder (flexion). ? Avoid shrugging your shoulder while you raise your arm. Keep your shoulder blade tucked down toward the middle of your back. 3. Hold for __________ seconds. 4. Slowly return to the starting position. Repeat __________ times. Complete this exercise __________ times a day. Shoulder abduction, standing 1. Stand and hold a broomstick,  a cane, or a similar object. Place your hands a little more than shoulder width apart on the object. Your left / right hand should be palm up, and your other hand should be palm down. 2. Keep your elbow straight and your shoulder muscles relaxed. Push the object across your body toward your left / right side. Raise your left / right arm to the side of your body (abduction) until you feel a stretch in your shoulder. ? Do not raise your arm above shoulder height unless your health care provider tells you to do that. ? If directed, raise your arm over your head. ? Avoid shrugging your shoulder while you raise your arm. Keep your shoulder blade tucked down toward the middle of your back. 3. Hold for __________ seconds. 4. Slowly return to the starting position. Repeat __________ times. Complete this exercise __________ times a day. Internal rotation  1. Place your left / right hand behind your back, palm up. 2. Use your other hand to dangle an exercise band, a towel, or a similar object over your shoulder. Grasp the band with your left / right hand so you are holding on to both ends. 3. Gently pull up on the band until you feel a stretch in the front of your left / right shoulder. The movement of your arm toward the center of your body is called internal rotation. ? Avoid shrugging your shoulder while you raise your arm. Keep your shoulder blade tucked down toward the middle of your back. 4. Hold for __________ seconds. 5. Release the stretch by letting go of the band and lowering your hands. Repeat __________ times. Complete this exercise __________ times a day. Strengthening exercises External rotation  1. Sit in a stable chair without armrests. 2. Secure an exercise band to a stable object at elbow height on your left / right side. 3. Place a soft object, such as a folded towel or a small pillow, between your left / right upper arm and your body to move your elbow about 4 inches (10 cm) away  from your side. 4. Hold the end of the exercise band so it is tight and there is no slack. 5. Keeping your elbow pressed against the soft object, slowly move your forearm out, away from your abdomen (external rotation). Keep your body steady so only your forearm moves. 6. Hold for __________ seconds. 7. Slowly return to the starting position. Repeat __________ times. Complete this exercise __________ times a day. Shoulder abduction  1. Sit in a stable chair without armrests, or stand up. 2. Hold a __________ weight in your left / right hand, or hold an exercise band with both hands. 3. Start with your arms straight down and your left / right palm facing in, toward your body. 4. Slowly lift your left / right hand out to your side (abduction). Do not lift your hand above shoulder height unless your health care provider tells you that this is safe. ? Keep your arms straight. ? Avoid shrugging your shoulder while you   do this movement. Keep your shoulder blade tucked down toward the middle of your back. 5. Hold for __________ seconds. 6. Slowly lower your arm, and return to the starting position. Repeat __________ times. Complete this exercise __________ times a day. Shoulder extension 1. Sit in a stable chair without armrests, or stand up. 2. Secure an exercise band to a stable object in front of you so it is at shoulder height. 3. Hold one end of the exercise band in each hand. Your palms should face each other. 4. Straighten your elbows and lift your hands up to shoulder height. 5. Step back, away from the secured end of the exercise band, until the band is tight and there is no slack. 6. Squeeze your shoulder blades together as you pull your hands down to the sides of your thighs (extension). Stop when your hands are straight down by your sides. Do not let your hands go behind your body. 7. Hold for __________ seconds. 8. Slowly return to the starting position. Repeat __________ times.  Complete this exercise __________ times a day. Shoulder row 1. Sit in a stable chair without armrests, or stand up. 2. Secure an exercise band to a stable object in front of you so it is at waist height. 3. Hold one end of the exercise band in each hand. Position your palms so that your thumbs are facing the ceiling (neutral position). 4. Bend each of your elbows to a 90-degree angle (right angle) and keep your upper arms at your sides. 5. Step back until the band is tight and there is no slack. 6. Slowly pull your elbows back behind you. 7. Hold for __________ seconds. 8. Slowly return to the starting position. Repeat __________ times. Complete this exercise __________ times a day. Shoulder press-ups  1. Sit in a stable chair that has armrests. Sit upright, with your feet flat on the floor. 2. Put your hands on the armrests so your elbows are bent and your fingers are pointing forward. Your hands should be about even with the sides of your body. 3. Push down on the armrests and use your arms to lift yourself off the chair. Straighten your elbows and lift yourself up as much as you comfortably can. ? Move your shoulder blades down, and avoid letting your shoulders move up toward your ears. ? Keep your feet on the ground. As you get stronger, your feet should support less of your body weight as you lift yourself up. 4. Hold for __________ seconds. 5. Slowly lower yourself back into the chair. Repeat __________ times. Complete this exercise __________ times a day. Wall push-ups  1. Stand so you are facing a stable wall. Your feet should be about one arm-length away from the wall. 2. Lean forward and place your palms on the wall at shoulder height. 3. Keep your feet flat on the floor as you bend your elbows and lean forward toward the wall. 4. Hold for __________ seconds. 5. Straighten your elbows to push yourself back to the starting position. Repeat __________ times. Complete this exercise  __________ times a day. This information is not intended to replace advice given to you by your health care provider. Make sure you discuss any questions you have with your health care provider. Document Revised: 12/12/2018 Document Reviewed: 09/19/2018 Elsevier Patient Education  2020 [employer]  

## 2020-06-21 ENCOUNTER — Telehealth: Payer: Self-pay

## 2020-06-21 NOTE — Telephone Encounter (Signed)
Patient called stating she has a cyst in the back of her left knee.  Patient states she has pain and stiffness and is not sure if it needs to be drained.  Patient scheduled an appointment on 07/06/20.

## 2020-06-21 NOTE — Telephone Encounter (Signed)
I called patient, patient did not want an earlier appt, patient will call if she decides to come in earlier.

## 2020-06-22 NOTE — Progress Notes (Signed)
Office Visit Note  Patient: Dorothy Ford             Date of Birth: Jun 26, 1951           MRN: [id number]             PCP: Neale Burly, MD Referring: Neale Burly, MD Visit Date: 07/06/2020 Occupation: @GUAROCC @  Subjective:  Left knee pain.   History of Present Illness: Dorothy Ford is a 69 y.o. female with history of osteoarthritis gout and degenerative disc disease.  She states she has been having left knee joint pain for the last few months.  It has been gradually getting worse.  She is having difficulty walking.  She also has difficulty climbing stairs and getting up from the chair.  She states in September she fell in her bathtub and injured her left shoulder and her head.  She was seen in the emergency room.  She states she has been going to physical therapy for her shoulder which is helping.  He does not recall having a pseudogout flare.  She has been taking colchicine 1 tablet a day on a regular basis.  Fibromyalgia symptoms are tolerable.  Activities of Daily Living:  Patient reports morning stiffness for 24 hours.   Patient Reports nocturnal pain.  Difficulty dressing/grooming: Reports Difficulty climbing stairs: Reports Difficulty getting out of chair: Reports Difficulty using hands for taps, buttons, cutlery, and/or writing: Reports  Review of Systems  Constitutional: Positive for fatigue.  HENT: Positive for mouth dryness.   Eyes: Positive for dryness.  Respiratory: Positive for shortness of breath.        History of asthma  Cardiovascular: Positive for swelling in legs/feet.  Gastrointestinal: Negative for constipation.  Endocrine: Positive for cold intolerance.  Genitourinary: Negative for difficulty urinating.  Musculoskeletal: Positive for arthralgias, gait problem, joint pain, muscle weakness, morning stiffness and muscle tenderness.  Skin: Negative for rash.  Allergic/Immunologic: Negative for susceptible to infections.  Neurological:  Positive for numbness and weakness.  Hematological: Negative for bruising/bleeding tendency.  Psychiatric/Behavioral: Positive for sleep disturbance.    PMFS History:  Patient Active Problem List   Diagnosis Date Noted  . Ankle joint pain 12/07/2019  . Low back pain 12/07/2019  . Lumbar radiculopathy 12/07/2019  . Pain of left upper arm 12/07/2019  . Acute foot pain, left 04/15/2019  . Bursitis/tendonitis, shoulder 04/23/2018  . Inflammatory arthropathy 02/20/2018  . Shoulder pain 02/20/2018  . Pain in both knees 11/20/2017  . 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 Chondromalacia patellae, right knee 01/15/2017  . Osteoarthritis of both hands 07/24/2016  . DDD (degenerative disc disease), cervical 07/24/2016  . DDD (degenerative disc disease), lumbar 07/24/2016  . Osteoporosis 07/24/2016  . Glaucoma 07/24/2016  . Sleep apnea 07/24/2016  . Pseudogout 07/19/2016  . Primary osteoarthritis of both knees 07/19/2016  . Fibromyalgia 07/19/2016  . 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 Pulmonary embolism ([place]) 01/18/2012  . Anxiety 01/18/2012  . Syncope   . RHEUMATOID LUNG 12/30/2009  . OBESITY, UNSPECIFIED 06/02/2009  . DEPRESSIVE DISORDER NOT ELSEWHERE CLASSIFIED 06/02/2009  . SUPRAVENTRICULAR TACHYCARDIA 06/02/2009  . Chronic diastolic heart failure ([place]) 06/02/2009  . COPD 06/02/2009  . DIAPHRAGMAT HERN W/O MENTION OBSTRUCTION/GANGREN 06/02/2009  . PALPITATIONS 06/02/2009  . CHEST PAIN UNSPECIFIED 06/02/2009    Past Medical History:  Diagnosis Date  . Acid reflux   . Asthma   . Cardiac dysrhythmia, unspecified   . Carotid bruit    Bilateral  . Cataract   .  Chest pain, unspecified   . Chronic airway obstruction, not elsewhere classified   . Chronic sinusitis   . Congestive heart failure, unspecified   . DDD (degenerative disc disease), cervical 07/24/2016  . DDD (degenerative disc disease), lumbar 07/24/2016  . Depressive disorder, not elsewhere classified   .  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 Edema    left leg  . Fibromyalgia   . High cholesterol   . History of rheumatoid arthritis   . Neuropathy   . Obesity, unspecified   . Obstructive lung disease (generalized) ([place])   . Osteoarthritis of both hands 07/24/2016  . Osteopenia   . Osteoporosis 07/24/2016  . Palpitations   . Pseudogout involving multiple joints   . SVT (supraventricular tachycardia) ([place])   . Syncope    admitted 09/2011  . Thyroid disease   . Unspecified essential hypertension     Family History  Problem Relation Age of Onset  . Heart disease Mother   . Hypertension Mother   . Stroke Other   . Heart disease Father   . Hypertension Father   . Kidney failure Sister   . Asthma Sister    Past Surgical History:  Procedure Laterality Date  . CARPAL TUNNEL RELEASE    . CATHETER ABLATION  02/1997   @ [place]     Social History   Social History Narrative  . Not on file   Immunization History  Administered Date(s) Administered  . Moderna SARS-COVID-2 Vaccination 10/08/2019, 11/06/2019     Objective: Vital Signs: BP 128/82 (BP Location: Right Arm, Patient Position: Sitting, Cuff Size: Normal)   Pulse 66   Resp 18   Ht 5\' 1"  (1.549 m)   Wt 205 lb 9.6 oz (93.3 kg)   BMI 38.85 kg/m    Physical Exam Vitals and nursing note reviewed.  Constitutional:      Appearance: She is well-developed.  HENT:     Head: Normocephalic and atraumatic.  Eyes:     Conjunctiva/sclera: Conjunctivae normal.  Cardiovascular:     Rate and Rhythm: Normal rate and regular rhythm.     Heart sounds: Normal heart sounds.  Pulmonary:     Effort: Pulmonary effort is normal.     Breath sounds: Normal breath sounds.  Abdominal:     General: Bowel sounds are normal.     Palpations: Abdomen is soft.  Musculoskeletal:     Cervical back: Normal range of motion.  Lymphadenopathy:     Cervical: No cervical adenopathy.  Skin:     General: Skin is warm and dry.     Capillary Refill: Capillary refill takes less than 2 seconds.  Neurological:     Mental Status: She is alert and oriented to person, place, and time.  Psychiatric:        Behavior: Behavior normal.      Musculoskeletal Exam: Range of motion of her cervical spine.  Shoulder joints, elbow joints, wrist joints with good range of motion.  She had no synovitis over MCPs or PIPs.  Hip joints with good range of motion.  She had discomfort with range of motion of her left knee joint.  Warmth was noted on palpation of the left knee joint.  Right knee joint was in good range of motion without discomfort.  There was no tenderness over ankles.  There was no calf tenderness.  There was no tenderness over MTPs.  CDAI Exam: CDAI Score: -- Patient Global: --;  Provider Global: -- Swollen: --; Tender: -- Joint Exam 07/06/2020   No joint exam has been documented for this visit   There is currently no information documented on the homunculus. Go to the Rheumatology activity and complete the homunculus joint exam.  Investigation: No additional findings.  Imaging: No results found.  Recent Labs: Lab Results  Component Value Date   WBC 7.0 06/26/2018   HGB 12.0 06/26/2018   PLT 317 06/26/2018   NA 139 08/12/2018   K 3.2 (L) 08/12/2018   CL 104 08/12/2018   CO2 26 08/12/2018   GLUCOSE 96 08/12/2018   BUN 7 (L) 08/12/2018   CREATININE 0.97 08/12/2018   BILITOT 0.9 06/26/2018   AST 18 06/26/2018   ALT 18 06/26/2018   PROT 6.7 06/26/2018   CALCIUM 9.0 08/12/2018   GFRAA >60 08/12/2018    Speciality Comments: No specialty comments available.  Procedures:  Large Joint Inj: L knee on 07/06/2020 8:37 AM Indications: pain Details: 27 G 1.5 in needle, medial approach  Arthrogram: No  Medications: 40 mg triamcinolone acetonide 40 MG/ML; 1.5 mL lidocaine 1 % Aspirate: 0 mL Outcome: tolerated well, no immediate complications Procedure, treatment  alternatives, risks and benefits explained, specific risks discussed. Consent was given by the patient. Immediately prior to procedure a time out was called to verify the correct patient, procedure, equipment, support staff and site/side marked as required. Patient was prepped and draped in the usual sterile fashion.     Allergies: Adhesive [tape] and Codeine   Assessment / Plan:     Visit Diagnoses: Pseudogout - Colcrys 0.6 mg 1 tablet by mouth daily on as needed basis.  She denies having any pseudogout flares.  Chronic left shoulder pain-she is chronic discomfort in her left shoulder.  She had recent fall and has been having increased pain.  She is going to physical therapy.  Primary osteoarthritis of both hands-joint protection was discussed.  Left knee pain-she has been experiencing increased pain in her left knee joint.  I reviewed her x-rays from May 2021.  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  Per her request left knee joint was injected with 1-1/2 mL of lidocaine and cortisone as described above.  Informed consent was obtained.  She is also on anticoagulant and risk of bleeding was discussed.  Primary osteoarthritis of both knees-weight loss diet and exercise was discussed.  DDD (degenerative disc disease), cervical-she has been having increased discomfort in the cervical region since her fall.  She states she was evaluated in the emergency room.  DDD (degenerative disc disease), lumbar-chronic pain.  Fibromyalgia-she continues to have generalized discomfort.  Age-related osteoporosis without current pathological fracture - She is Fosamax.  Followed by her PCP.   Other medical problems are listed as follows:  History of CHF (congestive heart failure)  History of COPD - Followed by Dr. Etta Quill in [place]  History of pulmonary embolism  Chronic anticoagulation - due to pulmonary embolism  History of glaucoma  History of  sleep apnea  Orders: No orders of the defined types were placed in this encounter.  No orders of the defined types were placed in this encounter.    Follow-Up Instructions: Return in about 6 months (around 01/03/2021) for Pseudogout, OA, DDD.   Bo Merino, MD  Note - This record has been created using Editor, commissioning.  Chart creation errors have been sought, but may not always  have been located. Such creation errors do not reflect on  the  standard of medical care. 

## 2020-07-06 ENCOUNTER — Ambulatory Visit (INDEPENDENT_AMBULATORY_CARE_PROVIDER_SITE_OTHER): Payer: Medicare Other | Admitting: Rheumatology

## 2020-07-06 ENCOUNTER — Other Ambulatory Visit: Payer: Self-pay

## 2020-07-06 ENCOUNTER — Encounter: Payer: Self-pay | Admitting: Rheumatology

## 2020-07-06 VITALS — BP 128/82 | HR 66 | Resp 18 | Ht 61.0 in | Wt 205.6 lb

## 2020-07-06 DIAGNOSIS — M25512 Pain in left shoulder: Secondary | ICD-10-CM | POA: Diagnosis not present

## 2020-07-06 DIAGNOSIS — Z86711 Personal history of pulmonary embolism: Secondary | ICD-10-CM

## 2020-07-06 DIAGNOSIS — Z8679 Personal history of other diseases of the circulatory system: Secondary | ICD-10-CM

## 2020-07-06 DIAGNOSIS — M25562 Pain in left knee: Secondary | ICD-10-CM | POA: Diagnosis not present

## 2020-07-06 DIAGNOSIS — Z8709 Personal history of other diseases of the respiratory system: Secondary | ICD-10-CM

## 2020-07-06 DIAGNOSIS — M810 Age-related osteoporosis without current pathological fracture: Secondary | ICD-10-CM

## 2020-07-06 DIAGNOSIS — M19041 Primary osteoarthritis, right hand: Secondary | ICD-10-CM

## 2020-07-06 DIAGNOSIS — M170 Bilateral primary osteoarthritis of knee: Secondary | ICD-10-CM

## 2020-07-06 DIAGNOSIS — G8929 Other chronic pain: Secondary | ICD-10-CM

## 2020-07-06 DIAGNOSIS — M1120 Other chondrocalcinosis, unspecified site: Secondary | ICD-10-CM

## 2020-07-06 DIAGNOSIS — Z8669 Personal history of other diseases of the nervous system and sense organs: Secondary | ICD-10-CM

## 2020-07-06 DIAGNOSIS — M5030 Other cervical disc degeneration, unspecified cervical region: Secondary | ICD-10-CM

## 2020-07-06 DIAGNOSIS — M797 Fibromyalgia: Secondary | ICD-10-CM

## 2020-07-06 DIAGNOSIS — M19042 Primary osteoarthritis, left hand: Secondary | ICD-10-CM

## 2020-07-06 DIAGNOSIS — Z7901 Long term (current) use of anticoagulants: Secondary | ICD-10-CM

## 2020-07-06 DIAGNOSIS — M5136 Other intervertebral disc degeneration, lumbar region: Secondary | ICD-10-CM

## 2020-07-06 MED ORDER — TRIAMCINOLONE ACETONIDE 40 MG/ML IJ SUSP
40.0000 mg | INTRAMUSCULAR | Status: AC | PRN
  Administered 2020-07-06: 40 mg via INTRA_ARTICULAR

## 2020-07-06 MED ORDER — LIDOCAINE HCL 1 % IJ SOLN
1.5000 mL | INTRAMUSCULAR | Status: AC | PRN
  Administered 2020-07-06: 1.5 mL

## 2020-07-06 NOTE — Patient Instructions (Signed)

## 2020-09-12 DIAGNOSIS — M9901 Segmental and somatic dysfunction of cervical region: Secondary | ICD-10-CM | POA: Diagnosis not present

## 2020-09-12 DIAGNOSIS — M542 Cervicalgia: Secondary | ICD-10-CM | POA: Diagnosis not present

## 2020-09-12 DIAGNOSIS — M9902 Segmental and somatic dysfunction of thoracic region: Secondary | ICD-10-CM | POA: Diagnosis not present

## 2020-09-12 DIAGNOSIS — M9903 Segmental and somatic dysfunction of lumbar region: Secondary | ICD-10-CM | POA: Diagnosis not present

## 2020-09-12 DIAGNOSIS — M546 Pain in thoracic spine: Secondary | ICD-10-CM | POA: Diagnosis not present

## 2020-09-16 DIAGNOSIS — Z7901 Long term (current) use of anticoagulants: Secondary | ICD-10-CM | POA: Diagnosis not present

## 2020-09-22 DIAGNOSIS — M25562 Pain in left knee: Secondary | ICD-10-CM | POA: Diagnosis not present

## 2020-09-22 DIAGNOSIS — Z79891 Long term (current) use of opiate analgesic: Secondary | ICD-10-CM | POA: Diagnosis not present

## 2020-09-22 DIAGNOSIS — M5416 Radiculopathy, lumbar region: Secondary | ICD-10-CM | POA: Diagnosis not present

## 2020-09-22 DIAGNOSIS — M5010 Cervical disc disorder with radiculopathy, unspecified cervical region: Secondary | ICD-10-CM | POA: Diagnosis not present

## 2020-09-22 DIAGNOSIS — M79622 Pain in left upper arm: Secondary | ICD-10-CM | POA: Diagnosis not present

## 2020-09-22 DIAGNOSIS — M25579 Pain in unspecified ankle and joints of unspecified foot: Secondary | ICD-10-CM | POA: Diagnosis not present

## 2020-09-22 DIAGNOSIS — M25519 Pain in unspecified shoulder: Secondary | ICD-10-CM | POA: Diagnosis not present

## 2020-09-22 DIAGNOSIS — M797 Fibromyalgia: Secondary | ICD-10-CM | POA: Diagnosis not present

## 2020-09-22 DIAGNOSIS — R269 Unspecified abnormalities of gait and mobility: Secondary | ICD-10-CM | POA: Diagnosis not present

## 2020-09-22 DIAGNOSIS — M545 Low back pain, unspecified: Secondary | ICD-10-CM | POA: Diagnosis not present

## 2020-10-14 DIAGNOSIS — Z7901 Long term (current) use of anticoagulants: Secondary | ICD-10-CM | POA: Diagnosis not present

## 2020-10-19 DIAGNOSIS — M9901 Segmental and somatic dysfunction of cervical region: Secondary | ICD-10-CM | POA: Diagnosis not present

## 2020-10-19 DIAGNOSIS — M9902 Segmental and somatic dysfunction of thoracic region: Secondary | ICD-10-CM | POA: Diagnosis not present

## 2020-10-19 DIAGNOSIS — M9903 Segmental and somatic dysfunction of lumbar region: Secondary | ICD-10-CM | POA: Diagnosis not present

## 2020-10-19 DIAGNOSIS — M546 Pain in thoracic spine: Secondary | ICD-10-CM | POA: Diagnosis not present

## 2020-10-19 DIAGNOSIS — M542 Cervicalgia: Secondary | ICD-10-CM | POA: Diagnosis not present

## 2020-10-24 ENCOUNTER — Ambulatory Visit: Payer: Medicare Other | Admitting: Rheumatology

## 2020-11-16 DIAGNOSIS — M9901 Segmental and somatic dysfunction of cervical region: Secondary | ICD-10-CM | POA: Diagnosis not present

## 2020-11-16 DIAGNOSIS — M546 Pain in thoracic spine: Secondary | ICD-10-CM | POA: Diagnosis not present

## 2020-11-16 DIAGNOSIS — M9902 Segmental and somatic dysfunction of thoracic region: Secondary | ICD-10-CM | POA: Diagnosis not present

## 2020-11-16 DIAGNOSIS — M9903 Segmental and somatic dysfunction of lumbar region: Secondary | ICD-10-CM | POA: Diagnosis not present

## 2020-11-16 DIAGNOSIS — M542 Cervicalgia: Secondary | ICD-10-CM | POA: Diagnosis not present

## 2020-11-17 DIAGNOSIS — M818 Other osteoporosis without current pathological fracture: Secondary | ICD-10-CM | POA: Diagnosis not present

## 2020-11-17 DIAGNOSIS — M1710 Unilateral primary osteoarthritis, unspecified knee: Secondary | ICD-10-CM | POA: Diagnosis not present

## 2020-11-17 DIAGNOSIS — Z7901 Long term (current) use of anticoagulants: Secondary | ICD-10-CM | POA: Diagnosis not present

## 2020-11-17 DIAGNOSIS — E039 Hypothyroidism, unspecified: Secondary | ICD-10-CM | POA: Diagnosis not present

## 2020-11-17 DIAGNOSIS — I10 Essential (primary) hypertension: Secondary | ICD-10-CM | POA: Diagnosis not present

## 2020-11-17 DIAGNOSIS — E7849 Other hyperlipidemia: Secondary | ICD-10-CM | POA: Diagnosis not present

## 2020-11-17 DIAGNOSIS — J309 Allergic rhinitis, unspecified: Secondary | ICD-10-CM | POA: Diagnosis not present

## 2020-11-21 DIAGNOSIS — G8929 Other chronic pain: Secondary | ICD-10-CM | POA: Diagnosis not present

## 2020-11-21 DIAGNOSIS — M542 Cervicalgia: Secondary | ICD-10-CM | POA: Diagnosis not present

## 2020-11-21 DIAGNOSIS — M25512 Pain in left shoulder: Secondary | ICD-10-CM | POA: Diagnosis not present

## 2020-12-09 DIAGNOSIS — Z7901 Long term (current) use of anticoagulants: Secondary | ICD-10-CM | POA: Diagnosis not present

## 2020-12-13 ENCOUNTER — Other Ambulatory Visit: Payer: Self-pay | Admitting: *Deleted

## 2020-12-13 MED ORDER — FUROSEMIDE 40 MG PO TABS: 40.0000 mg | ORAL_TABLET | Freq: Every day | ORAL | 0 refills | Status: DC

## 2020-12-15 DIAGNOSIS — M5416 Radiculopathy, lumbar region: Secondary | ICD-10-CM | POA: Diagnosis not present

## 2020-12-15 DIAGNOSIS — M25579 Pain in unspecified ankle and joints of unspecified foot: Secondary | ICD-10-CM | POA: Diagnosis not present

## 2020-12-15 DIAGNOSIS — M25562 Pain in left knee: Secondary | ICD-10-CM | POA: Diagnosis not present

## 2020-12-15 DIAGNOSIS — M5010 Cervical disc disorder with radiculopathy, unspecified cervical region: Secondary | ICD-10-CM | POA: Diagnosis not present

## 2020-12-15 DIAGNOSIS — R269 Unspecified abnormalities of gait and mobility: Secondary | ICD-10-CM | POA: Diagnosis not present

## 2020-12-15 DIAGNOSIS — M25519 Pain in unspecified shoulder: Secondary | ICD-10-CM | POA: Diagnosis not present

## 2020-12-15 DIAGNOSIS — M79622 Pain in left upper arm: Secondary | ICD-10-CM | POA: Diagnosis not present

## 2020-12-15 DIAGNOSIS — G5603 Carpal tunnel syndrome, bilateral upper limbs: Secondary | ICD-10-CM | POA: Diagnosis not present

## 2020-12-15 DIAGNOSIS — M797 Fibromyalgia: Secondary | ICD-10-CM | POA: Diagnosis not present

## 2020-12-15 DIAGNOSIS — Z79891 Long term (current) use of opiate analgesic: Secondary | ICD-10-CM | POA: Diagnosis not present

## 2020-12-15 DIAGNOSIS — M545 Low back pain, unspecified: Secondary | ICD-10-CM | POA: Diagnosis not present

## 2020-12-19 NOTE — Progress Notes (Deleted)
Office Visit Note  Patient: Dorothy Ford             Date of Birth: [birth year]           MRN: [id number]             PCP: Neale Burly, MD Referring: Neale Burly, MD Visit Date: 01/02/2021 Occupation: @GUAROCC @  Subjective:    History of Present Illness: Dorothy Ford is a 70 y.o. female with history of pseudogout, osteoarthritis, and DDD.  She takes colchicine 0.6 mg 1 tablet by mouth daily as needed during flares.   She takes cymbalta 60 mg 1 capsule by mouth daily and gabapentin 300 mg TID.    She takes fosamax 70 mg 1 tablet by mouth once weekly as well as calcium and vitamin D daily for management of osteoporosis.   Activities of Daily Living:  Patient reports morning stiffness for *** {minute/hour:19697}.   Patient {ACTIONS;DENIES/REPORTS:21021675::"Denies"} nocturnal pain.  Difficulty dressing/grooming: {ACTIONS;DENIES/REPORTS:21021675::"Denies"} Difficulty climbing stairs: {ACTIONS;DENIES/REPORTS:21021675::"Denies"} Difficulty getting out of chair: {ACTIONS;DENIES/REPORTS:21021675::"Denies"} Difficulty using hands for taps, buttons, cutlery, and/or writing: {ACTIONS;DENIES/REPORTS:21021675::"Denies"}  Review of Systems  Constitutional: Negative for fatigue.  HENT: Negative for mouth sores, mouth dryness and nose dryness.   Eyes: Negative for pain, visual disturbance and dryness.  Respiratory: Negative for cough, hemoptysis, shortness of breath and difficulty breathing.   Cardiovascular: Negative for chest pain, palpitations, hypertension and swelling in legs/feet.  Gastrointestinal: Negative for blood in stool, constipation and diarrhea.  Endocrine: Negative for increased urination.  Genitourinary: Negative for painful urination.  Musculoskeletal: Negative for arthralgias, joint pain, joint swelling, myalgias, muscle weakness, morning stiffness, muscle tenderness and myalgias.  Skin: Negative for color change, pallor, rash, hair loss, nodules/bumps,  skin tightness, ulcers and sensitivity to sunlight.  Allergic/Immunologic: Negative for susceptible to infections.  Neurological: Negative for dizziness, numbness, headaches and weakness.  Hematological: Negative for swollen glands.  Psychiatric/Behavioral: Negative for depressed mood and sleep disturbance. The patient is not nervous/anxious.     PMFS History:  Patient Active Problem List   Diagnosis Date Noted  . Ankle joint pain 12/07/2019  . Low back pain 12/07/2019  . Lumbar radiculopathy 12/07/2019  . Pain of left upper arm 12/07/2019  . Acute foot pain, left 04/15/2019  . Bursitis/tendonitis, shoulder 04/23/2018  . Inflammatory arthropathy 02/20/2018  . Shoulder pain 02/20/2018  . Pain in both knees 11/20/2017  . 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 Chondromalacia patellae, right knee 01/15/2017  . Osteoarthritis of both hands 07/24/2016  . DDD (degenerative disc disease), cervical 07/24/2016  . DDD (degenerative disc disease), lumbar 07/24/2016  . Osteoporosis 07/24/2016  . Glaucoma 07/24/2016  . Sleep apnea 07/24/2016  . Pseudogout 07/19/2016  . Primary osteoarthritis of both knees 07/19/2016  . Fibromyalgia 07/19/2016  . 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 Pulmonary embolism ([place]) 01/18/2012  . Anxiety 01/18/2012  . Syncope   . RHEUMATOID LUNG 12/30/2009  . OBESITY, UNSPECIFIED 06/02/2009  . DEPRESSIVE DISORDER NOT ELSEWHERE CLASSIFIED 06/02/2009  . SUPRAVENTRICULAR TACHYCARDIA 06/02/2009  . Chronic diastolic heart failure ([place]) 06/02/2009  . COPD 06/02/2009  . DIAPHRAGMAT HERN W/O MENTION OBSTRUCTION/GANGREN 06/02/2009  . PALPITATIONS 06/02/2009  . CHEST PAIN UNSPECIFIED 06/02/2009    Past Medical History:  Diagnosis Date  . Acid reflux   . Asthma   . Cardiac dysrhythmia, unspecified   . Carotid bruit    Bilateral  . Cataract   . Chest pain, unspecified   . Chronic airway obstruction, not elsewhere classified   .  Chronic sinusitis   .  Congestive heart failure, unspecified   . DDD (degenerative disc disease), cervical 07/24/2016  . DDD (degenerative disc disease), lumbar 07/24/2016  . Depressive disorder, not elsewhere classified   . 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 Edema    left leg  . Fibromyalgia   . High cholesterol   . History of rheumatoid arthritis   . Neuropathy   . Obesity, unspecified   . Obstructive lung disease (generalized) ([place])   . Osteoarthritis of both hands 07/24/2016  . Osteopenia   . Osteoporosis 07/24/2016  . Palpitations   . Pseudogout involving multiple joints   . SVT (supraventricular tachycardia) ([place])   . Syncope    admitted 09/2011  . Thyroid disease   . Unspecified essential hypertension     Family History  Problem Relation Age of Onset  . Heart disease Mother   . Hypertension Mother   . Stroke Other   . Heart disease Father   . Hypertension Father   . Kidney failure Sister   . Asthma Sister    Past Surgical History:  Procedure Laterality Date  . CARPAL TUNNEL RELEASE    . CATHETER ABLATION  02/1997   @ [place]     Social History   Social History Narrative  . Not on file   Immunization History  Administered Date(s) Administered  . Moderna Sars-Covid-2 Vaccination 10/08/2019, 11/06/2019     Objective: Vital Signs: There were no vitals taken for this visit.   Physical Exam Vitals and nursing note reviewed.  Constitutional:      Appearance: She is well-developed.  HENT:     Head: Normocephalic and atraumatic.  Eyes:     Conjunctiva/sclera: Conjunctivae normal.  Pulmonary:     Effort: Pulmonary effort is normal.  Abdominal:     Palpations: Abdomen is soft.  Musculoskeletal:     Cervical back: Normal range of motion.  Skin:    General: Skin is warm and dry.     Capillary Refill: Capillary refill takes less than 2 seconds.  Neurological:     Mental Status: She is alert and oriented to person,  place, and time.  Psychiatric:        Behavior: Behavior normal.      Musculoskeletal Exam: ***  CDAI Exam: CDAI Score: -- Patient Global: --; Provider Global: -- Swollen: --; Tender: -- Joint Exam 01/02/2021   No joint exam has been documented for this visit   There is currently no information documented on the homunculus. Go to the Rheumatology activity and complete the homunculus joint exam.  Investigation: No additional findings.  Imaging: No results found.  Recent Labs: Lab Results  Component Value Date   WBC 7.0 06/26/2018   HGB 12.0 06/26/2018   PLT 317 06/26/2018   NA 139 08/12/2018   K 3.2 (L) 08/12/2018   CL 104 08/12/2018   CO2 26 08/12/2018   GLUCOSE 96 08/12/2018   BUN 7 (L) 08/12/2018   CREATININE 0.97 08/12/2018   BILITOT 0.9 06/26/2018   AST 18 06/26/2018   ALT 18 06/26/2018   PROT 6.7 06/26/2018   CALCIUM 9.0 08/12/2018   GFRAA >60 08/12/2018    Speciality Comments: No specialty comments available.  Procedures:  No procedures performed Allergies: Adhesive [tape] and Codeine   Assessment / Plan:     Visit Diagnoses: No diagnosis found.  Orders: No orders of the defined types were placed in this encounter.  No orders of the defined types were placed in this encounter.     Follow-Up Instructions: No follow-ups on file.   Earnestine Mealing, CMA  Note - This record has been created using Editor, commissioning.  Chart creation errors have been sought, but may not always  have been located. Such creation errors do not reflect on  the standard of medical care.

## 2020-12-22 DIAGNOSIS — H25813 Combined forms of age-related cataract, bilateral: Secondary | ICD-10-CM | POA: Diagnosis not present

## 2020-12-22 DIAGNOSIS — H524 Presbyopia: Secondary | ICD-10-CM | POA: Diagnosis not present

## 2020-12-22 DIAGNOSIS — H40013 Open angle with borderline findings, low risk, bilateral: Secondary | ICD-10-CM | POA: Diagnosis not present

## 2020-12-30 DIAGNOSIS — Z7901 Long term (current) use of anticoagulants: Secondary | ICD-10-CM | POA: Diagnosis not present

## 2021-01-02 ENCOUNTER — Ambulatory Visit: Payer: Medicare Other | Admitting: Physician Assistant

## 2021-01-02 DIAGNOSIS — M19041 Primary osteoarthritis, right hand: Secondary | ICD-10-CM

## 2021-01-02 DIAGNOSIS — M546 Pain in thoracic spine: Secondary | ICD-10-CM | POA: Diagnosis not present

## 2021-01-02 DIAGNOSIS — M542 Cervicalgia: Secondary | ICD-10-CM | POA: Diagnosis not present

## 2021-01-02 DIAGNOSIS — M9903 Segmental and somatic dysfunction of lumbar region: Secondary | ICD-10-CM | POA: Diagnosis not present

## 2021-01-02 DIAGNOSIS — Z8669 Personal history of other diseases of the nervous system and sense organs: Secondary | ICD-10-CM

## 2021-01-02 DIAGNOSIS — M810 Age-related osteoporosis without current pathological fracture: Secondary | ICD-10-CM

## 2021-01-02 DIAGNOSIS — M5136 Other intervertebral disc degeneration, lumbar region: Secondary | ICD-10-CM

## 2021-01-02 DIAGNOSIS — G8929 Other chronic pain: Secondary | ICD-10-CM

## 2021-01-02 DIAGNOSIS — Z8679 Personal history of other diseases of the circulatory system: Secondary | ICD-10-CM

## 2021-01-02 DIAGNOSIS — M170 Bilateral primary osteoarthritis of knee: Secondary | ICD-10-CM

## 2021-01-02 DIAGNOSIS — Z8709 Personal history of other diseases of the respiratory system: Secondary | ICD-10-CM

## 2021-01-02 DIAGNOSIS — M5030 Other cervical disc degeneration, unspecified cervical region: Secondary | ICD-10-CM

## 2021-01-02 DIAGNOSIS — M9902 Segmental and somatic dysfunction of thoracic region: Secondary | ICD-10-CM | POA: Diagnosis not present

## 2021-01-02 DIAGNOSIS — M1120 Other chondrocalcinosis, unspecified site: Secondary | ICD-10-CM

## 2021-01-02 DIAGNOSIS — M797 Fibromyalgia: Secondary | ICD-10-CM

## 2021-01-02 DIAGNOSIS — M9901 Segmental and somatic dysfunction of cervical region: Secondary | ICD-10-CM | POA: Diagnosis not present

## 2021-01-02 DIAGNOSIS — Z86711 Personal history of pulmonary embolism: Secondary | ICD-10-CM

## 2021-01-02 DIAGNOSIS — Z7901 Long term (current) use of anticoagulants: Secondary | ICD-10-CM

## 2021-01-09 NOTE — Progress Notes (Deleted)
Cardiology Office Note  Date: 01/09/2021   ID: Dorothy Ford, DOB 1951-01-25, MRN [id number]  PCP:  Neale Burly, MD  Cardiologist:  Kate Sable, MD (Inactive) Electrophysiologist:  None   Chief Complaint: 1 year follow-up  History of Present Illness: Dorothy Ford is a 70 y.o. female with a history of PSVT status post SVT ablation in 1998, sinus tachycardia, palpitations, hypertension, chronic diastolic heart failure, COPD, PE, anxiety, panic attacks.  Carotid artery bruits.  Previous nuclear stress test in 2018 was normal.  EF greater than 65%  Last seen by Dr. Bronson Ing 12/09/2019.  Her chronic exertional dyspnea was stable.  She denied any palpitations or chest pain.  Primary complaints were related to bony arthritic pain including left knee, left shoulder, lower back, and cervical spine.  She continued with chronic lower extremity edema secondary to venous insufficiency.  She denied any chest pain.  Stated she only had palpitations when she had a panic attack.  She was symptomatically stable on Toprol-XL 25 mg daily.  She was euvolemic on Lasix 40 mg daily.  She remained on chronic anticoagulation via warfarin for history of pulmonary embolism.  Blood pressure was normal and there were no changes to therapy.  She was prescribed knee-high compression stockings for bilateral lower extremity edema/chronic venous insufficiency.  Past Medical History:  Diagnosis Date  . Acid reflux   . Asthma   . Cardiac dysrhythmia, unspecified   . Carotid bruit    Bilateral  . Cataract   . Chest pain, unspecified   . Chronic airway obstruction, not elsewhere classified   . Chronic sinusitis   . Congestive heart failure, unspecified   . DDD (degenerative disc disease), cervical 07/24/2016  . DDD (degenerative disc disease), lumbar 07/24/2016  . Depressive disorder, not elsewhere classified   . 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 Edema    left leg   . Fibromyalgia   . High cholesterol   . History of rheumatoid arthritis   . Neuropathy   . Obesity, unspecified   . Obstructive lung disease (generalized) ([place])   . Osteoarthritis of both hands 07/24/2016  . Osteopenia   . Osteoporosis 07/24/2016  . Palpitations   . Pseudogout involving multiple joints   . SVT (supraventricular tachycardia) ([place])   . Syncope    admitted 09/2011  . Thyroid disease   . Unspecified essential hypertension     Past Surgical History:  Procedure Laterality Date  . CARPAL TUNNEL RELEASE    . CATHETER ABLATION  02/1997   @ [place]      Current Outpatient Medications  Medication Sig Dispense Refill  . albuterol (PROVENTIL HFA;VENTOLIN HFA) 108 (90 Base) MCG/ACT inhaler Inhale into the lungs every 6 (six) hours as needed for wheezing or shortness of breath.    Marland Kitchen alendronate (FOSAMAX) 70 MG tablet Take 70 mg by mouth once a week. Take with a full glass of water on an empty stomach.    Marland Kitchen amLODipine (NORVASC) 5 MG tablet Take 5 mg by mouth daily.    Marland Kitchen atorvastatin (LIPITOR) 40 MG tablet Take 40 mg by mouth daily.    Marland Kitchen azelastine (ASTELIN) 0.1 % nasal spray Place 1 spray into both nostrils daily.    . Calcium Carb-Cholecalciferol (CALCIUM 600 + D PO) Take by mouth.    . cetirizine (ZYRTEC) 10 MG tablet Take 10 mg by mouth daily.    . clonazePAM (KLONOPIN) 0.5 MG  tablet Take 0.5 mg by mouth at bedtime.     Marland Kitchen COLCRYS 0.6 MG tablet TAKE 1 TABLET BY MOUTH ONCE DAILY. 30 tablet 0  . cycloSPORINE (RESTASIS) 0.05 % ophthalmic emulsion Place 1 drop into both eyes 2 (two) times daily.    . DULoxetine (CYMBALTA) 60 MG capsule Take 60 mg by mouth daily.     Regino Schultze Bandages & Supports (MEDICAL COMPRESSION STOCKINGS) MISC 1 each by Does not apply route as directed. 1 pair low pressure knee high compression stockings Dx: leg edema 1 each 0  . fluticasone (FLONASE) 50 MCG/ACT nasal spray Place 1 spray into the nose daily.     . furosemide  (LASIX) 40 MG tablet Take 1 tablet (40 mg total) by mouth daily. 15 tablet 0  . gabapentin (NEURONTIN) 300 MG capsule Take 300 mg by mouth 3 (three) times daily.     Marland Kitchen guaiFENesin (MUCINEX) 600 MG 12 hr tablet Take 600 mg by mouth 2 (two) times daily as needed.    Marland Kitchen ipratropium (ATROVENT) 0.06 % nasal spray Place 1 spray into the nose daily.    Marland Kitchen ipratropium-albuterol (DUONEB) 0.5-2.5 (3) MG/3ML SOLN Take 3 mLs by nebulization every 6 (six) hours as needed.     . levalbuterol (XOPENEX HFA) 45 MCG/ACT inhaler Inhale 2 puffs into the lungs every 6 (six) hours as needed for wheezing.    Marland Kitchen levocetirizine (XYZAL) 5 MG tablet Take 5 mg by mouth daily.    Marland Kitchen levothyroxine (SYNTHROID, LEVOTHROID) 50 MCG tablet Take 50 mcg by mouth daily before breakfast.     . loperamide (IMODIUM A-D) 2 MG tablet Take 2 mg by mouth 4 (four) times daily as needed for diarrhea or loose stools.    . meclizine (ANTIVERT) 25 MG tablet meclizine 25 mg tablet    . metoprolol tartrate (LOPRESSOR) 25 MG tablet Take 25 mg by mouth daily.    . montelukast (SINGULAIR) 10 MG tablet Take 1 tablet by mouth daily.    . naloxone (NARCAN) 4 MG/0.1ML LIQD nasal spray kit Narcan 4 mg/actuation nasal spray    . nystatin (MYCOSTATIN) powder Apply topically as needed.    . ondansetron (ZOFRAN) 4 MG tablet as needed.     Marland Kitchen oxyCODONE (OXY IR/ROXICODONE) 5 MG immediate release tablet as needed.     . pantoprazole (PROTONIX) 40 MG tablet Take 40 mg by mouth daily.    Marland Kitchen PARoxetine (PAXIL) 30 MG tablet Take 30 mg by mouth daily.    . potassium chloride (K-DUR) 10 MEQ tablet Take 3 tablets (30 mEq total) by mouth daily. 90 tablet 6  . Sennosides (SENOKOT PO) Take by mouth in the morning and at bedtime.     . Simethicone (GAS-X EXTRA STRENGTH) 125 MG CAPS Take 2 capsules by mouth as needed.    . SYMBICORT 160-4.5 MCG/ACT inhaler Inhale 2 puffs into the lungs 2 (two) times daily.    Marland Kitchen tiZANidine (ZANAFLEX) 4 MG tablet Take 4 mg by mouth as needed.     . VOLTAREN 1 % GEL Apply 1 g topically 2 (two) times daily.    Marland Kitchen warfarin (COUMADIN) 5 MG tablet Take 5 mg by mouth daily. Managed by [facility] office  2.43m Monday thru Friday. 5 mg on Saturday and Sunday.     No current facility-administered medications for this visit.   Allergies:  Adhesive [tape] and Codeine   Social History: The patient  reports that she has never smoked. She has never used smokeless tobacco. She  reports that she does not drink alcohol and does not use drugs.   Family History: The patient's family history includes Asthma in her sister; Heart disease in her father and mother; Hypertension in her father and mother; Kidney failure in her sister; Stroke in an other family member.   ROS:  Please see the history of present illness. Otherwise, complete review of systems is positive for none.  All other systems are reviewed and negative.   Physical Exam: VS:  There were no vitals taken for this visit., BMI There is no height or weight on file to calculate BMI.  Wt Readings from Last 3 Encounters:  07/06/20 205 lb 9.6 oz (93.3 kg)  04/22/20 206 lb (93.4 kg)  03/03/20 206 lb 12.8 oz (93.8 kg)    General: Patient appears comfortable at rest. HEENT: Conjunctiva and lids normal, oropharynx clear with moist mucosa. Neck: Supple, no elevated JVP or carotid bruits, no thyromegaly. Lungs: Clear to auscultation, nonlabored breathing at rest. Cardiac: Regular rate and rhythm, no S3 or significant systolic murmur, no pericardial rub. Abdomen: Soft, nontender, no hepatomegaly, bowel sounds present, no guarding or rebound. Extremities: No pitting edema, distal pulses 2+. Skin: Warm and dry. Musculoskeletal: No kyphosis. Neuropsychiatric: Alert and oriented x3, affect grossly appropriate.  ECG:  {EKG/Telemetry Strips Reviewed:2542073139}  Recent Labwork: No results found for requested labs within last 8760 hours.  No results found for: CHOL, TRIG, HDL, CHOLHDL, VLDL, LDLCALC,  LDLDIRECT  Other Studies Reviewed Today:   Assessment and Plan:  1. Palpitations   2. Chronic diastolic heart failure ([place])   3. History of pulmonary embolism   4. Essential (primary) hypertension   5. Lower extremity edema      Medication Adjustments/Labs and Tests Ordered: Current medicines are reviewed at length with the patient today.  Concerns regarding medicines are outlined above.   Disposition: Follow-up with ***  Signed, Levell July, NP 01/09/2021 9:30 PM    [place] at [facility] [address], [place], [place] [address] Phone: [phone]; Fax: [phone]

## 2021-01-10 ENCOUNTER — Ambulatory Visit: Payer: Medicare Other | Admitting: Family Medicine

## 2021-01-10 DIAGNOSIS — R600 Localized edema: Secondary | ICD-10-CM

## 2021-01-10 DIAGNOSIS — R002 Palpitations: Secondary | ICD-10-CM

## 2021-01-10 DIAGNOSIS — I10 Essential (primary) hypertension: Secondary | ICD-10-CM

## 2021-01-10 DIAGNOSIS — Z86711 Personal history of pulmonary embolism: Secondary | ICD-10-CM

## 2021-01-10 DIAGNOSIS — I5032 Chronic diastolic (congestive) heart failure: Secondary | ICD-10-CM

## 2021-01-12 DIAGNOSIS — J309 Allergic rhinitis, unspecified: Secondary | ICD-10-CM | POA: Diagnosis not present

## 2021-01-13 ENCOUNTER — Other Ambulatory Visit: Payer: Self-pay | Admitting: Family Medicine

## 2021-01-14 DIAGNOSIS — E039 Hypothyroidism, unspecified: Secondary | ICD-10-CM | POA: Diagnosis not present

## 2021-01-14 DIAGNOSIS — S20461A Insect bite (nonvenomous) of right back wall of thorax, initial encounter: Secondary | ICD-10-CM | POA: Diagnosis not present

## 2021-01-14 DIAGNOSIS — J449 Chronic obstructive pulmonary disease, unspecified: Secondary | ICD-10-CM | POA: Diagnosis not present

## 2021-01-14 DIAGNOSIS — I110 Hypertensive heart disease with heart failure: Secondary | ICD-10-CM | POA: Diagnosis not present

## 2021-01-14 DIAGNOSIS — I509 Heart failure, unspecified: Secondary | ICD-10-CM | POA: Diagnosis not present

## 2021-01-14 DIAGNOSIS — M797 Fibromyalgia: Secondary | ICD-10-CM | POA: Diagnosis not present

## 2021-01-14 DIAGNOSIS — Z86711 Personal history of pulmonary embolism: Secondary | ICD-10-CM | POA: Diagnosis not present

## 2021-01-14 DIAGNOSIS — W57XXXA Bitten or stung by nonvenomous insect and other nonvenomous arthropods, initial encounter: Secondary | ICD-10-CM | POA: Diagnosis not present

## 2021-01-14 DIAGNOSIS — E785 Hyperlipidemia, unspecified: Secondary | ICD-10-CM | POA: Diagnosis not present

## 2021-01-19 DIAGNOSIS — A938 Other specified arthropod-borne viral fevers: Secondary | ICD-10-CM | POA: Diagnosis not present

## 2021-01-27 DIAGNOSIS — Z7901 Long term (current) use of anticoagulants: Secondary | ICD-10-CM | POA: Diagnosis not present

## 2021-01-28 DIAGNOSIS — I5032 Chronic diastolic (congestive) heart failure: Secondary | ICD-10-CM | POA: Diagnosis not present

## 2021-01-28 DIAGNOSIS — R0602 Shortness of breath: Secondary | ICD-10-CM | POA: Diagnosis not present

## 2021-01-28 DIAGNOSIS — Z86711 Personal history of pulmonary embolism: Secondary | ICD-10-CM | POA: Diagnosis not present

## 2021-01-28 DIAGNOSIS — Z86718 Personal history of other venous thrombosis and embolism: Secondary | ICD-10-CM | POA: Diagnosis not present

## 2021-01-28 DIAGNOSIS — Z9981 Dependence on supplemental oxygen: Secondary | ICD-10-CM | POA: Diagnosis not present

## 2021-01-28 DIAGNOSIS — E039 Hypothyroidism, unspecified: Secondary | ICD-10-CM | POA: Diagnosis not present

## 2021-01-28 DIAGNOSIS — I10 Essential (primary) hypertension: Secondary | ICD-10-CM | POA: Diagnosis not present

## 2021-01-28 DIAGNOSIS — S20469A Insect bite (nonvenomous) of unspecified back wall of thorax, initial encounter: Secondary | ICD-10-CM | POA: Diagnosis not present

## 2021-01-28 DIAGNOSIS — I110 Hypertensive heart disease with heart failure: Secondary | ICD-10-CM | POA: Diagnosis not present

## 2021-01-28 DIAGNOSIS — R21 Rash and other nonspecific skin eruption: Secondary | ICD-10-CM | POA: Diagnosis not present

## 2021-01-28 DIAGNOSIS — E785 Hyperlipidemia, unspecified: Secondary | ICD-10-CM | POA: Diagnosis not present

## 2021-01-28 DIAGNOSIS — R0902 Hypoxemia: Secondary | ICD-10-CM | POA: Diagnosis not present

## 2021-01-28 DIAGNOSIS — J440 Chronic obstructive pulmonary disease with acute lower respiratory infection: Secondary | ICD-10-CM | POA: Diagnosis not present

## 2021-01-28 DIAGNOSIS — Z87891 Personal history of nicotine dependence: Secondary | ICD-10-CM | POA: Diagnosis not present

## 2021-01-28 DIAGNOSIS — Z7983 Long term (current) use of bisphosphonates: Secondary | ICD-10-CM | POA: Diagnosis not present

## 2021-01-28 DIAGNOSIS — Z7951 Long term (current) use of inhaled steroids: Secondary | ICD-10-CM | POA: Diagnosis not present

## 2021-01-28 DIAGNOSIS — U071 COVID-19: Secondary | ICD-10-CM | POA: Diagnosis not present

## 2021-01-28 DIAGNOSIS — J9601 Acute respiratory failure with hypoxia: Secondary | ICD-10-CM | POA: Diagnosis not present

## 2021-01-28 DIAGNOSIS — K449 Diaphragmatic hernia without obstruction or gangrene: Secondary | ICD-10-CM | POA: Diagnosis not present

## 2021-01-28 DIAGNOSIS — J1282 Pneumonia due to coronavirus disease 2019: Secondary | ICD-10-CM | POA: Diagnosis not present

## 2021-01-28 DIAGNOSIS — I5030 Unspecified diastolic (congestive) heart failure: Secondary | ICD-10-CM | POA: Diagnosis not present

## 2021-01-28 DIAGNOSIS — Z791 Long term (current) use of non-steroidal anti-inflammatories (NSAID): Secondary | ICD-10-CM | POA: Diagnosis not present

## 2021-01-28 DIAGNOSIS — Z79899 Other long term (current) drug therapy: Secondary | ICD-10-CM | POA: Diagnosis not present

## 2021-01-28 DIAGNOSIS — S20461A Insect bite (nonvenomous) of right back wall of thorax, initial encounter: Secondary | ICD-10-CM | POA: Diagnosis not present

## 2021-01-28 DIAGNOSIS — Z7901 Long term (current) use of anticoagulants: Secondary | ICD-10-CM | POA: Diagnosis not present

## 2021-01-28 DIAGNOSIS — J449 Chronic obstructive pulmonary disease, unspecified: Secondary | ICD-10-CM | POA: Diagnosis not present

## 2021-01-28 DIAGNOSIS — M797 Fibromyalgia: Secondary | ICD-10-CM | POA: Diagnosis not present

## 2021-01-28 DIAGNOSIS — R509 Fever, unspecified: Secondary | ICD-10-CM | POA: Diagnosis not present

## 2021-01-28 DIAGNOSIS — Z872 Personal history of diseases of the skin and subcutaneous tissue: Secondary | ICD-10-CM | POA: Diagnosis not present

## 2021-01-28 DIAGNOSIS — W57XXXA Bitten or stung by nonvenomous insect and other nonvenomous arthropods, initial encounter: Secondary | ICD-10-CM | POA: Diagnosis not present

## 2021-01-28 DIAGNOSIS — J9811 Atelectasis: Secondary | ICD-10-CM | POA: Diagnosis not present

## 2021-02-01 NOTE — Progress Notes (Deleted)
Cardiology Office Note  Date: 02/01/2021   ID: Dorothy Ford, DOB Nov 09, 1950, MRN [id number]  PCP:  Neale Burly, MD  Cardiologist:  Kate Sable, MD (Inactive) Electrophysiologist:  None   Chief Complaint: 1 year follow-up  History of Present Illness: Dorothy Ford is a 71 y.o. female with a history of PSVT status post SVT ablation in 1998, sinus tachycardia, palpitations, hypertension, chronic diastolic heart failure, COPD, PE, anxiety, panic attacks.  Carotid artery bruits.  Previous nuclear stress test in 2018 was normal.  EF greater than 65%  Last seen by Dr. Bronson Ing 12/09/2019.  Her chronic exertional dyspnea was stable.  She denied any palpitations or chest pain.  Primary complaints were related to bony arthritic pain including left knee, left shoulder, lower back, and cervical spine.  She continued with chronic lower extremity edema secondary to venous insufficiency.  She denied any chest pain.  Stated she only had palpitations when she had a panic attack.  She was symptomatically stable on Toprol-XL 25 mg daily.  She was euvolemic on Lasix 40 mg daily.  She remained on chronic anticoagulation via warfarin for history of pulmonary embolism.  Blood pressure was normal and there were no changes to therapy.  She was prescribed knee-high compression stockings for bilateral lower extremity edema/chronic venous insufficiency.  Recently admitted to [facility] on 01/28/2021 for COVID-pneumonia.  She had a prior history of recent few days was feeling hot, fever, fatigue, headache, nausea, cough.  She had a positive COVID test at home.  She had exposure from her sister.  She was being treated with Rocephin, dexamethasone, remdesivir, vitamin D and C with zinc.   Past Medical History:  Diagnosis Date  . Acid reflux   . Asthma   . Cardiac dysrhythmia, unspecified   . Carotid bruit    Bilateral  . Cataract   . Chest pain, unspecified   . Chronic airway  obstruction, not elsewhere classified   . Chronic sinusitis   . Congestive heart failure, unspecified   . DDD (degenerative disc disease), cervical 07/24/2016  . DDD (degenerative disc disease), lumbar 07/24/2016  . Depressive disorder, not elsewhere classified   . 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 Edema    left leg  . Fibromyalgia   . High cholesterol   . History of rheumatoid arthritis   . Neuropathy   . Obesity, unspecified   . Obstructive lung disease (generalized) ([place])   . Osteoarthritis of both hands 07/24/2016  . Osteopenia   . Osteoporosis 07/24/2016  . Palpitations   . Pseudogout involving multiple joints   . SVT (supraventricular tachycardia) ([place])   . Syncope    admitted 09/2011  . Thyroid disease   . Unspecified essential hypertension     Past Surgical History:  Procedure Laterality Date  . CARPAL TUNNEL RELEASE    . CATHETER ABLATION  02/1997   @ [place]      Current Outpatient Medications  Medication Sig Dispense Refill  . albuterol (PROVENTIL HFA;VENTOLIN HFA) 108 (90 Base) MCG/ACT inhaler Inhale into the lungs every 6 (six) hours as needed for wheezing or shortness of breath.    Marland Kitchen alendronate (FOSAMAX) 70 MG tablet Take 70 mg by mouth once a week. Take with a full glass of water on an empty stomach.    Marland Kitchen amLODipine (NORVASC) 5 MG tablet Take 5 mg by mouth daily.    Marland Kitchen atorvastatin (LIPITOR) 40 MG  tablet Take 40 mg by mouth daily.    Marland Kitchen azelastine (ASTELIN) 0.1 % nasal spray Place 1 spray into both nostrils daily.    . Calcium Carb-Cholecalciferol (CALCIUM 600 + D PO) Take by mouth.    . cetirizine (ZYRTEC) 10 MG tablet Take 10 mg by mouth daily.    . clonazePAM (KLONOPIN) 0.5 MG tablet Take 0.5 mg by mouth at bedtime.     Marland Kitchen COLCRYS 0.6 MG tablet TAKE 1 TABLET BY MOUTH ONCE DAILY. 30 tablet 0  . cycloSPORINE (RESTASIS) 0.05 % ophthalmic emulsion Place 1 drop into both eyes 2 (two) times daily.    .  DULoxetine (CYMBALTA) 60 MG capsule Take 60 mg by mouth daily.     Regino Schultze Bandages & Supports (MEDICAL COMPRESSION STOCKINGS) MISC 1 each by Does not apply route as directed. 1 pair low pressure knee high compression stockings Dx: leg edema 1 each 0  . fluticasone (FLONASE) 50 MCG/ACT nasal spray Place 1 spray into the nose daily.     . furosemide (LASIX) 40 MG tablet TAKE 1 TABLET BY MOUTH ONCE DAILY. 90 tablet 0  . gabapentin (NEURONTIN) 300 MG capsule Take 300 mg by mouth 3 (three) times daily.     Marland Kitchen guaiFENesin (MUCINEX) 600 MG 12 hr tablet Take 600 mg by mouth 2 (two) times daily as needed.    Marland Kitchen ipratropium (ATROVENT) 0.06 % nasal spray Place 1 spray into the nose daily.    Marland Kitchen ipratropium-albuterol (DUONEB) 0.5-2.5 (3) MG/3ML SOLN Take 3 mLs by nebulization every 6 (six) hours as needed.     . levalbuterol (XOPENEX HFA) 45 MCG/ACT inhaler Inhale 2 puffs into the lungs every 6 (six) hours as needed for wheezing.    Marland Kitchen levocetirizine (XYZAL) 5 MG tablet Take 5 mg by mouth daily.    Marland Kitchen levothyroxine (SYNTHROID, LEVOTHROID) 50 MCG tablet Take 50 mcg by mouth daily before breakfast.     . loperamide (IMODIUM A-D) 2 MG tablet Take 2 mg by mouth 4 (four) times daily as needed for diarrhea or loose stools.    . meclizine (ANTIVERT) 25 MG tablet meclizine 25 mg tablet    . metoprolol tartrate (LOPRESSOR) 25 MG tablet Take 25 mg by mouth daily.    . montelukast (SINGULAIR) 10 MG tablet Take 1 tablet by mouth daily.    . naloxone (NARCAN) 4 MG/0.1ML LIQD nasal spray kit Narcan 4 mg/actuation nasal spray    . nystatin (MYCOSTATIN) powder Apply topically as needed.    . ondansetron (ZOFRAN) 4 MG tablet as needed.     Marland Kitchen oxyCODONE (OXY IR/ROXICODONE) 5 MG immediate release tablet as needed.     . pantoprazole (PROTONIX) 40 MG tablet Take 40 mg by mouth daily.    Marland Kitchen PARoxetine (PAXIL) 30 MG tablet Take 30 mg by mouth daily.    . potassium chloride (K-DUR) 10 MEQ tablet Take 3 tablets (30 mEq total) by mouth  daily. 90 tablet 6  . Sennosides (SENOKOT PO) Take by mouth in the morning and at bedtime.     . Simethicone (GAS-X EXTRA STRENGTH) 125 MG CAPS Take 2 capsules by mouth as needed.    . SYMBICORT 160-4.5 MCG/ACT inhaler Inhale 2 puffs into the lungs 2 (two) times daily.    Marland Kitchen tiZANidine (ZANAFLEX) 4 MG tablet Take 4 mg by mouth as needed.    . VOLTAREN 1 % GEL Apply 1 g topically 2 (two) times daily.    Marland Kitchen warfarin (COUMADIN) 5 MG tablet Take  5 mg by mouth daily. Managed by [facility] office  2.92m Monday thru Friday. 5 mg on Saturday and Sunday.     No current facility-administered medications for this visit.   Allergies:  Adhesive [tape] and Codeine   Social History: The patient  reports that she has never smoked. She has never used smokeless tobacco. She reports that she does not drink alcohol and does not use drugs.   Family History: The patient's family history includes Asthma in her sister; Heart disease in her father and mother; Hypertension in her father and mother; Kidney failure in her sister; Stroke in an other family member.   ROS:  Please see the history of present illness. Otherwise, complete review of systems is positive for none.  All other systems are reviewed and negative.   Physical Exam: VS:  There were no vitals taken for this visit., BMI There is no height or weight on file to calculate BMI.  Wt Readings from Last 3 Encounters:  07/06/20 205 lb 9.6 oz (93.3 kg)  04/22/20 206 lb (93.4 kg)  03/03/20 206 lb 12.8 oz (93.8 kg)    General: Patient appears comfortable at rest. HEENT: Conjunctiva and lids normal, oropharynx clear with moist mucosa. Neck: Supple, no elevated JVP or carotid bruits, no thyromegaly. Lungs: Clear to auscultation, nonlabored breathing at rest. Cardiac: Regular rate and rhythm, no S3 or significant systolic murmur, no pericardial rub. Abdomen: Soft, nontender, no hepatomegaly, bowel sounds present, no guarding or rebound. Extremities: No pitting  edema, distal pulses 2+. Skin: Warm and dry. Musculoskeletal: No kyphosis. Neuropsychiatric: Alert and oriented x3, affect grossly appropriate.  ECG:  {EKG/Telemetry Strips [id number]}  Recent Labwork: No results found for requested labs within last 8760 hours.  No results found for: CHOL, TRIG, HDL, CHOLHDL, VLDL, LDLCALC, LDLDIRECT  Other Studies Reviewed Today:   Assessment and Plan:  No diagnosis found.   Medication Adjustments/Labs and Tests Ordered: Current medicines are reviewed at length with the patient today.  Concerns regarding medicines are outlined above.   Disposition: Follow-up with ***  Signed, ALevell July NP 02/01/2021 9:37 PM    [facility] [place] [address]Phone: [phone] Fax: [phone]

## 2021-02-02 ENCOUNTER — Ambulatory Visit: Payer: Medicare Other | Admitting: Family Medicine

## 2021-02-13 DIAGNOSIS — J45909 Unspecified asthma, uncomplicated: Secondary | ICD-10-CM | POA: Diagnosis not present

## 2021-02-13 DIAGNOSIS — U099 Post covid-19 condition, unspecified: Secondary | ICD-10-CM | POA: Diagnosis not present

## 2021-02-13 DIAGNOSIS — Z0000 Encounter for general adult medical examination without abnormal findings: Secondary | ICD-10-CM | POA: Diagnosis not present

## 2021-02-17 DIAGNOSIS — Z7901 Long term (current) use of anticoagulants: Secondary | ICD-10-CM | POA: Diagnosis not present

## 2021-02-27 DIAGNOSIS — M546 Pain in thoracic spine: Secondary | ICD-10-CM | POA: Diagnosis not present

## 2021-02-27 DIAGNOSIS — M542 Cervicalgia: Secondary | ICD-10-CM | POA: Diagnosis not present

## 2021-02-27 DIAGNOSIS — M9903 Segmental and somatic dysfunction of lumbar region: Secondary | ICD-10-CM | POA: Diagnosis not present

## 2021-02-27 DIAGNOSIS — M9902 Segmental and somatic dysfunction of thoracic region: Secondary | ICD-10-CM | POA: Diagnosis not present

## 2021-02-27 DIAGNOSIS — M9901 Segmental and somatic dysfunction of cervical region: Secondary | ICD-10-CM | POA: Diagnosis not present

## 2021-02-27 NOTE — Progress Notes (Signed)
Cardiology Office Note  Date: 02/28/2021   ID: Dorothy Ford, DOB [birth year], MRN [id number]  PCP:  Neale Burly, MD  Cardiologist:  None Electrophysiologist:  None   Chief Complaint: 1 year follow-up / Had recent COVID infection  History of Present Illness: Dorothy Ford is a 70 y.o. female with a history of PSVT status post SVT ablation in 1998, sinus tachycardia, palpitations, hypertension, chronic diastolic heart failure, COPD, PE, anxiety, panic attacks.  Carotid artery bruits, chronic venous insufficiency / LE edema  Previous nuclear stress test in 2018 was normal.  EF greater than 65%  Last seen by Dr. Bronson Ing 12/09/2019.  Her chronic exertional dyspnea was stable.  She denied any palpitations or chest pain.  Primary complaints were related to bony arthritic pain including left knee, left shoulder, lower back, and cervical spine.  She continued with chronic lower extremity edema secondary to venous insufficiency.  She denied any chest pain.  Stated she only had palpitations when she had a panic attack.  She was symptomatically stable on Toprol-XL 25 mg daily.  She was euvolemic on Lasix 40 mg daily.  She remained on chronic anticoagulation via warfarin for history of pulmonary embolism.  Blood pressure was normal and there were no changes to therapy.  She was prescribed knee-high compression stockings for bilateral lower extremity edema/chronic venous insufficiency.  Recently admitted to [facility] on 01/28/2021 for COVID-pneumonia.  She had a prior history of recent few days with feeling hot, fever, fatigue, headache, nausea, cough.  She had a positive COVID test at home.  She had exposure from her sister.  She was being treated with Rocephin, dexamethasone, remdesivir, vitamin D and C with zinc.   She is here for 1 year follow-up today.  She is status post COVID-pneumonia on 01/28/2021.  She states she still has some lingering effects with cough and  shortness of breath associated with COVID-pneumonia.  Otherwise denies any anginal symptoms, orthostatic symptoms, CVA or TIA-like symptoms, claudication-like symptoms, bleeding, DVT or PE-like symptoms, or lower extremity edema.   Past Medical History:  Diagnosis Date   Acid reflux    Asthma    Cardiac dysrhythmia, unspecified    Carotid bruit    Bilateral   Cataract    Chest pain, unspecified    Chronic airway obstruction, not elsewhere classified    Chronic sinusitis    Congestive heart failure, unspecified    DDD (degenerative disc disease), cervical 07/24/2016   DDD (degenerative disc disease), lumbar 07/24/2016   Depressive disorder, not elsewhere classified    Diaphragmatic hernia without mention of obstruction or gangrene    Edema    left leg   Fibromyalgia    High cholesterol    History of rheumatoid arthritis    Neuropathy    Obesity, unspecified    Obstructive lung disease (generalized) ([place])    Osteoarthritis of both hands 07/24/2016   Osteopenia    Osteoporosis 07/24/2016   Palpitations    Pseudogout involving multiple joints    SVT (supraventricular tachycardia) ([place])    Syncope    admitted 09/2011   Thyroid disease    Unspecified essential hypertension     Past Surgical History:  Procedure Laterality Date   CARPAL TUNNEL RELEASE     CATHETER ABLATION  02/1997   @ [facility]   VEIN SURGERY BLADDER      Current Outpatient Medications  Medication Sig Dispense Refill   albuterol (PROVENTIL HFA;VENTOLIN HFA) 108 (90 Base) MCG/ACT inhaler  Inhale into the lungs every 6 (six) hours as needed for wheezing or shortness of breath.     alendronate (FOSAMAX) 70 MG tablet Take 70 mg by mouth once a week. Take with a full glass of water on an empty stomach.     amLODipine (NORVASC) 5 MG tablet Take 5 mg by mouth daily.     ascorbic acid (VITAMIN C) 500 MG tablet Take 500 mg by mouth daily.     atorvastatin (LIPITOR) 40 MG tablet Take 40 mg by mouth daily.      azelastine (ASTELIN) 0.1 % nasal spray Place 1 spray into both nostrils daily.     Calcium Carb-Cholecalciferol (CALCIUM 600 + D PO) Take by mouth.     cetirizine (ZYRTEC) 10 MG tablet Take 10 mg by mouth daily.     Cholecalciferol 25 MCG (1000 UT) tablet Take 1 tablet by mouth daily.     clonazePAM (KLONOPIN) 0.5 MG tablet Take 0.5 mg by mouth at bedtime.      COLCRYS 0.6 MG tablet TAKE 1 TABLET BY MOUTH ONCE DAILY. 30 tablet 0   cycloSPORINE (RESTASIS) 0.05 % ophthalmic emulsion Place 1 drop into both eyes 2 (two) times daily.     Elastic Bandages & Supports (MEDICAL COMPRESSION STOCKINGS) MISC 1 each by Does not apply route as directed. 1 pair low pressure knee high compression stockings Dx: leg edema 1 each 0   famotidine (PEPCID) 20 MG tablet Take 20 mg by mouth 2 (two) times daily.     fluticasone (FLONASE) 50 MCG/ACT nasal spray Place 1 spray into the nose daily.      furosemide (LASIX) 40 MG tablet TAKE 1 TABLET BY MOUTH ONCE DAILY. 90 tablet 0   gabapentin (NEURONTIN) 300 MG capsule Take 300 mg by mouth 3 (three) times daily.      guaiFENesin (MUCINEX) 600 MG 12 hr tablet Take 600 mg by mouth 2 (two) times daily as needed.     ipratropium-albuterol (DUONEB) 0.5-2.5 (3) MG/3ML SOLN Take 3 mLs by nebulization every 6 (six) hours as needed.      levalbuterol (XOPENEX HFA) 45 MCG/ACT inhaler Inhale 2 puffs into the lungs every 6 (six) hours as needed for wheezing.     levothyroxine (SYNTHROID, LEVOTHROID) 50 MCG tablet Take 50 mcg by mouth daily before breakfast.      loperamide (IMODIUM A-D) 2 MG tablet Take 2 mg by mouth 4 (four) times daily as needed for diarrhea or loose stools.     metoprolol tartrate (LOPRESSOR) 25 MG tablet Take 25 mg by mouth daily.     montelukast (SINGULAIR) 10 MG tablet Take 1 tablet by mouth daily.     naloxone (NARCAN) 4 MG/0.1ML LIQD nasal spray kit Narcan 4 mg/actuation nasal spray     nystatin (MYCOSTATIN) powder Apply topically as needed.     ondansetron  (ZOFRAN) 4 MG tablet as needed.      oxyCODONE (OXY IR/ROXICODONE) 5 MG immediate release tablet as needed.      pantoprazole (PROTONIX) 40 MG tablet Take 40 mg by mouth daily.     PARoxetine (PAXIL) 30 MG tablet Take 30 mg by mouth daily.     Sennosides (SENOKOT PO) Take by mouth in the morning and at bedtime.      Simethicone 125 MG CAPS Take 2 capsules by mouth as needed.     SYMBICORT 160-4.5 MCG/ACT inhaler Inhale 2 puffs into the lungs 2 (two) times daily.     warfarin (COUMADIN) 2 MG  tablet Take 2 mg by mouth as directed. Managed by PCP     zinc gluconate 50 MG tablet Take 1 tablet by mouth daily.     potassium chloride (K-DUR) 10 MEQ tablet Take 3 tablets (30 mEq total) by mouth daily. 90 tablet 6   No current facility-administered medications for this visit.   Allergies:  Adhesive [tape] and Codeine   Social History: The patient  reports that she has never smoked. She has never used smokeless tobacco. She reports that she does not drink alcohol and does not use drugs.   Family History: The patient's family history includes Asthma in her sister; Heart disease in her father and mother; Hypertension in her father and mother; Kidney failure in her sister; Stroke in an other family member.   ROS:  Please see the history of present illness. Otherwise, complete review of systems is positive for none.  All other systems are reviewed and negative.   Physical Exam: VS:  BP 130/80   Pulse 82   Ht _0  (1.549 m)   Wt 203 lb 3.2 oz (92.2 kg)   SpO2 96%   BMI 38.39 kg/m , BMI Body mass index is 38.39 kg/m.  Wt Readings from Last 3 Encounters:  02/28/21 203 lb 3.2 oz (92.2 kg)  07/06/20 205 lb 9.6 oz (93.3 kg)  04/22/20 206 lb (93.4 kg)    General: Patient appears comfortable at rest. Neck: Supple, no elevated JVP or carotid bruits, no thyromegaly. Lungs: Clear to auscultation, nonlabored breathing at rest. Cardiac: Regular rate and rhythm, no S3 or significant systolic murmur, no  pericardial rub. Extremities: No pitting edema, distal pulses 2+. Skin: Warm and dry. Musculoskeletal: No kyphosis. Neuropsychiatric: Alert and oriented x3, affect grossly appropriate.  ECG: EKG at [facility] on 01/30/2021 normal sinus rhythm nonspecific T wave abnormality rate of 76.  Recent Labwork: No results found for requested labs within last 8760 hours.  No results found for: CHOL, TRIG, HDL, CHOLHDL, VLDL, LDLCALC, LDLDIRECT  Other Studies Reviewed Today:   Assessment and Plan:  1. Chronic diastolic heart failure ([place])   2. PSVT (paroxysmal supraventricular tachycardia) ([place])   3. Essential (primary) hypertension   4. Lower extremity edema   5. Pulmonary embolism, unspecified chronicity, unspecified pulmonary embolism type, unspecified whether acute cor pulmonale present ([place])   6. Mixed hyperlipidemia    1. Chronic diastolic heart failure (H[facility]) No clinical symptoms of heart failure.  She does have some lingering shortness of breath from recent COVID-pneumonia infection Jan 28, 2021.  Weight is currently stable at 203.  She does have some chronic dyspnea but no clinical signs of heart failure.  Lungs are clear to auscultation.  She has chronic lower extremity edema.  Continue Lasix 40 mg daily.  Continue potassium supplementation.  2. PSVT (paroxysmal supraventricular tachycardia) ([place]) States she has occasional rapid heart rates associated with exertion and recently after COVID virus 1 month ago.  Denies any specific palpitations or arrhythmias.  3. Essential (primary) hypertension Blood pressure well controlled at 130/80.  Continue amlodipine 5 mg daily.  Continue metoprolol 25 mg p.o. daily  4. Lower extremity edema Has chronic lower extremity and wears compression stockings/socks.  5. Pulmonary embolism, unspecified chronicity, unspecified pulmonary embolism type, unspecified whether acute cor pulmonale present ([place]) Continues on Coumadin for PE prophylaxis.  No DVT or  PE-like symptoms.  Recent COVID-pneumonia Jan 28, 2021  6.  Hyperlipidemia Continue atorvastatin 40 mg p.o. daily.  Medication Adjustments/Labs and Tests Ordered: Current medicines  are reviewed at length with the patient today.  Concerns regarding medicines are outlined above.   Disposition: Follow-up with Dr. Harl Bowie or APP 6 months  Signed, Levell July, NP 02/28/2021 10:51 AM    [facility] at [place], [place], [place] [address] Phone: [phone]; Fax: [phone]

## 2021-02-28 ENCOUNTER — Encounter: Payer: Self-pay | Admitting: *Deleted

## 2021-02-28 ENCOUNTER — Ambulatory Visit (INDEPENDENT_AMBULATORY_CARE_PROVIDER_SITE_OTHER): Payer: Medicare Other | Admitting: Family Medicine

## 2021-02-28 ENCOUNTER — Encounter: Payer: Self-pay | Admitting: Family Medicine

## 2021-02-28 VITALS — BP 130/80 | HR 82 | Ht 61.0 in | Wt 203.2 lb

## 2021-02-28 DIAGNOSIS — R600 Localized edema: Secondary | ICD-10-CM | POA: Diagnosis not present

## 2021-02-28 DIAGNOSIS — I2699 Other pulmonary embolism without acute cor pulmonale: Secondary | ICD-10-CM | POA: Diagnosis not present

## 2021-02-28 DIAGNOSIS — I5032 Chronic diastolic (congestive) heart failure: Secondary | ICD-10-CM | POA: Diagnosis not present

## 2021-02-28 DIAGNOSIS — I471 Supraventricular tachycardia: Secondary | ICD-10-CM

## 2021-02-28 DIAGNOSIS — E782 Mixed hyperlipidemia: Secondary | ICD-10-CM

## 2021-02-28 DIAGNOSIS — I10 Essential (primary) hypertension: Secondary | ICD-10-CM

## 2021-02-28 NOTE — Patient Instructions (Signed)

## 2021-03-03 ENCOUNTER — Encounter: Payer: Self-pay | Admitting: Physician Assistant

## 2021-03-03 ENCOUNTER — Other Ambulatory Visit: Payer: Self-pay

## 2021-03-03 ENCOUNTER — Ambulatory Visit: Payer: Self-pay

## 2021-03-03 ENCOUNTER — Ambulatory Visit (INDEPENDENT_AMBULATORY_CARE_PROVIDER_SITE_OTHER): Payer: Medicare Other | Admitting: Physician Assistant

## 2021-03-03 VITALS — BP 128/64 | HR 70 | Ht 61.0 in | Wt 204.6 lb

## 2021-03-03 DIAGNOSIS — M25512 Pain in left shoulder: Secondary | ICD-10-CM

## 2021-03-03 DIAGNOSIS — Z8709 Personal history of other diseases of the respiratory system: Secondary | ICD-10-CM | POA: Diagnosis not present

## 2021-03-03 DIAGNOSIS — M797 Fibromyalgia: Secondary | ICD-10-CM

## 2021-03-03 DIAGNOSIS — M51369 Other intervertebral disc degeneration, lumbar region without mention of lumbar back pain or lower extremity pain: Secondary | ICD-10-CM

## 2021-03-03 DIAGNOSIS — Z7901 Long term (current) use of anticoagulants: Secondary | ICD-10-CM

## 2021-03-03 DIAGNOSIS — M1120 Other chondrocalcinosis, unspecified site: Secondary | ICD-10-CM | POA: Diagnosis not present

## 2021-03-03 DIAGNOSIS — Z8669 Personal history of other diseases of the nervous system and sense organs: Secondary | ICD-10-CM

## 2021-03-03 DIAGNOSIS — M8589 Other specified disorders of bone density and structure, multiple sites: Secondary | ICD-10-CM

## 2021-03-03 DIAGNOSIS — M19041 Primary osteoarthritis, right hand: Secondary | ICD-10-CM

## 2021-03-03 DIAGNOSIS — M25562 Pain in left knee: Secondary | ICD-10-CM | POA: Diagnosis not present

## 2021-03-03 DIAGNOSIS — R509 Fever, unspecified: Secondary | ICD-10-CM | POA: Diagnosis not present

## 2021-03-03 DIAGNOSIS — I10 Essential (primary) hypertension: Secondary | ICD-10-CM | POA: Diagnosis not present

## 2021-03-03 DIAGNOSIS — M170 Bilateral primary osteoarthritis of knee: Secondary | ICD-10-CM | POA: Diagnosis not present

## 2021-03-03 DIAGNOSIS — Z86711 Personal history of pulmonary embolism: Secondary | ICD-10-CM

## 2021-03-03 DIAGNOSIS — Z8679 Personal history of other diseases of the circulatory system: Secondary | ICD-10-CM

## 2021-03-03 DIAGNOSIS — M5030 Other cervical disc degeneration, unspecified cervical region: Secondary | ICD-10-CM | POA: Diagnosis not present

## 2021-03-03 DIAGNOSIS — U071 COVID-19: Secondary | ICD-10-CM | POA: Diagnosis not present

## 2021-03-03 DIAGNOSIS — M810 Age-related osteoporosis without current pathological fracture: Secondary | ICD-10-CM

## 2021-03-03 DIAGNOSIS — G8929 Other chronic pain: Secondary | ICD-10-CM

## 2021-03-03 DIAGNOSIS — M5136 Other intervertebral disc degeneration, lumbar region: Secondary | ICD-10-CM | POA: Diagnosis not present

## 2021-03-03 DIAGNOSIS — M19042 Primary osteoarthritis, left hand: Secondary | ICD-10-CM

## 2021-03-03 DIAGNOSIS — R0902 Hypoxemia: Secondary | ICD-10-CM | POA: Diagnosis not present

## 2021-03-03 MED ORDER — TRIAMCINOLONE ACETONIDE 40 MG/ML IJ SUSP
40.0000 mg | INTRAMUSCULAR | Status: AC | PRN
Start: 2021-03-03 — End: 2021-03-03
  Administered 2021-03-03: 40 mg via INTRA_ARTICULAR

## 2021-03-03 MED ORDER — LIDOCAINE HCL 1 % IJ SOLN
1.5000 mL | INTRAMUSCULAR | Status: AC | PRN
  Administered 2021-03-03: 1.5 mL

## 2021-03-03 NOTE — Progress Notes (Signed)
Office Visit Note  Patient: Dorothy Ford             Date of Birth: [birth year]           MRN: [id number]             PCP: Neale Burly, MD Referring: Neale Burly, MD Visit Date: 03/03/2021 Occupation: @GUAROCC @  Subjective:  Left knee joint pain   History of Present Illness: Dorothy Ford is a 70 y.o. female with history of pseudogout, osteoarthritis, fibromyalgia, and osteoporosis.  She presents today with left knee joint pain which started in early June 2022. She denies any injury or fall. She denies noticing any joint swelling.  She has been taking colchicine 0.6 mg 1 tablet daily.  She has been using a cane to assist with ambulation.  She has ongoing myalgias and muscle tenderness due to fibromyalgia. She has chronic pain in the left shoulder joint and has difficulty with range of motion.  She has an orthopedist who she has been seeing to manage her shoulder discomfort and lower back pain. She takes fosamax 70 mg 1 tablet by mouth once weekly for management of osteoporosis.  She denies any falls or fractures.  She was hospitalized for 4 days in May 2022 with the covid-19 infection.  She continues to have some residual fatigue.  Activities of Daily Living:  Patient reports morning stiffness for several hours.   Patient Reports nocturnal pain.  Difficulty dressing/grooming: Reports Difficulty climbing stairs: Reports Difficulty getting out of chair: Reports Difficulty using hands for taps, buttons, cutlery, and/or writing: Reports  Review of Systems  Constitutional:  Positive for fatigue.  HENT:  Negative for mouth sores, mouth dryness and nose dryness.   Eyes:  Positive for dryness. Negative for pain, itching and visual disturbance.  Respiratory:  Positive for cough and shortness of breath. Negative for hemoptysis.   Cardiovascular:  Positive for swelling in legs/feet. Negative for chest pain and palpitations.  Gastrointestinal:  Negative for abdominal pain, blood  in stool, constipation and diarrhea.  Endocrine: Negative for increased urination.  Genitourinary:  Negative for painful urination.  Musculoskeletal:  Positive for joint pain, joint pain, joint swelling and morning stiffness. Negative for myalgias, muscle weakness, muscle tenderness and myalgias.  Skin:  Negative for color change, rash and redness.  Allergic/Immunologic: Negative for susceptible to infections.  Neurological:  Positive for dizziness and weakness. Negative for numbness, headaches and memory loss.  Hematological:  Negative for swollen glands.  Psychiatric/Behavioral:  Positive for sleep disturbance. Negative for confusion.    PMFS History:  Patient Active Problem List   Diagnosis Date Noted   Ankle joint pain 12/07/2019   Low back pain 12/07/2019   Lumbar radiculopathy 12/07/2019   Pain of left upper arm 12/07/2019   Acute foot pain, left 04/15/2019   Bursitis/tendonitis, shoulder 04/23/2018   Inflammatory arthropathy 02/20/2018   Shoulder pain 02/20/2018   Pain in both knees 11/20/2017   Chondromalacia patellae, left knee 01/15/2017   Chondromalacia patellae, right knee 01/15/2017   Osteoarthritis of both hands 07/24/2016   DDD (degenerative disc disease), cervical 07/24/2016   DDD (degenerative disc disease), lumbar 07/24/2016   Osteoporosis 07/24/2016   Glaucoma 07/24/2016   Sleep apnea 07/24/2016   Pseudogout 07/19/2016   Primary osteoarthritis of both knees 07/19/2016   Fibromyalgia 07/19/2016   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   Pulmonary embolism ([place]) 01/18/2012   Anxiety 01/18/2012   Syncope    RHEUMATOID LUNG 12/30/2009   OBESITY, UNSPECIFIED  06/02/2009   DEPRESSIVE DISORDER NOT ELSEWHERE CLASSIFIED 06/02/2009   SUPRAVENTRICULAR TACHYCARDIA 06/02/2009   Chronic diastolic heart failure ([place]) 06/02/2009   COPD 06/02/2009   DIAPHRAGMAT HERN W/O MENTION OBSTRUCTION/GANGREN 06/02/2009   PALPITATIONS 06/02/2009   CHEST PAIN UNSPECIFIED 06/02/2009    Past  Medical History:  Diagnosis Date   Acid reflux    Asthma    Cardiac dysrhythmia, unspecified    Carotid bruit    Bilateral   Cataract    Chest pain, unspecified    Chronic airway obstruction, not elsewhere classified    Chronic sinusitis    Congestive heart failure, unspecified    DDD (degenerative disc disease), cervical 07/24/2016   DDD (degenerative disc disease), lumbar 07/24/2016   Depressive disorder, not elsewhere classified    Diaphragmatic hernia without mention of obstruction or gangrene    Edema    left leg   Fibromyalgia    High cholesterol    History of rheumatoid arthritis    Neuropathy    Obesity, unspecified    Obstructive lung disease (generalized) ([place])    Osteoarthritis of both hands 07/24/2016   Osteopenia    Osteoporosis 07/24/2016   Palpitations    Pseudogout involving multiple joints    SVT (supraventricular tachycardia) ([place])    Syncope    admitted 09/2011   Thyroid disease    Unspecified essential hypertension     Family History  Problem Relation Age of Onset   Heart disease Mother    Hypertension Mother    Stroke Other    Heart disease Father    Hypertension Father    Kidney failure Sister    Asthma Sister    Past Surgical History:  Procedure Laterality Date   CARPAL TUNNEL RELEASE     CATHETER ABLATION  02/1997   @ [place] History   Social History Narrative   Not on file   Immunization History  Administered Date(s) Administered   Moderna Sars-Covid-2 Vaccination 10/08/2019, 11/06/2019     Objective: Vital Signs: BP 128/64 (BP Location: Right Arm, Patient Position: Sitting, Cuff Size: Large)   Pulse 70   Ht 5\' 1"  (1.549 m)   Wt 204 lb 9.6 oz (92.8 kg)   BMI 38.66 kg/m    Physical Exam Vitals and nursing note reviewed.  Constitutional:      Appearance: She is well-developed.  HENT:     Head: Normocephalic and atraumatic.  Eyes:     Conjunctiva/sclera: Conjunctivae normal.   Pulmonary:     Effort: Pulmonary effort is normal.  Abdominal:     Palpations: Abdomen is soft.  Musculoskeletal:     Cervical back: Normal range of motion.  Skin:    General: Skin is warm and dry.     Capillary Refill: Capillary refill takes less than 2 seconds.  Neurological:     Mental Status: She is alert and oriented to person, place, and time.  Psychiatric:        Behavior: Behavior normal.     Musculoskeletal Exam: Generalized hyperalgesia and positive tender points on exam.  C-spine is limited range of motion without rotation.  Trapezius muscle tension and muscle tenderness bilaterally.  Painful range of motion of both shoulder joints especially the left shoulder.  Limited abduction to about 90 degrees in the left shoulder.  Elbow joints, wrist joints, MCPs, PIPs, DIPs have good range of motion with no synovitis.  PIP and DIP thickening consistent with osteoarthritis of both  hands.  Hip joints have good range of motion.  Painful range of motion and slightly limited extension of the left knee.  Warmth of the left knee noted.  Right knee has good range of motion with no warmth or effusion.  Ankle joints have good range of motion with no tenderness.  CDAI Exam: CDAI Score: -- Patient Global: --; Provider Global: -- Swollen: --; Tender: -- Joint Exam 03/03/2021   No joint exam has been documented for this visit   There is currently no information documented on the homunculus. Go to the Rheumatology activity and complete the homunculus joint exam.  Investigation: No additional findings.  Imaging: No results found.  Recent Labs: Lab Results  Component Value Date   WBC 7.0 06/26/2018   HGB 12.0 06/26/2018   PLT 317 06/26/2018   NA 139 08/12/2018   K 3.2 (L) 08/12/2018   CL 104 08/12/2018   CO2 26 08/12/2018   GLUCOSE 96 08/12/2018   BUN 7 (L) 08/12/2018   CREATININE 0.97 08/12/2018   BILITOT 0.9 06/26/2018   AST 18 06/26/2018   ALT 18 06/26/2018   PROT 6.7  06/26/2018   CALCIUM 9.0 08/12/2018   GFRAA >60 08/12/2018    Speciality Comments: No specialty comments available.  Procedures:  Large Joint Inj: L knee on 03/03/2021 10:59 AM Indications: pain Details: 27 G 1.5 in needle, medial approach  Arthrogram: No  Medications: 1.5 mL lidocaine 1 %; 40 mg triamcinolone acetonide 40 MG/ML Aspirate: 0 mL Outcome: tolerated well, no immediate complications Procedure, treatment alternatives, risks and benefits explained, specific risks discussed. Consent was given by the patient. Immediately prior to procedure a time out was called to verify the correct patient, procedure, equipment, support staff and site/side marked as required. Patient was prepped and draped in the usual sterile fashion.    Allergies: Adhesive [tape] and Codeine   Assessment / Plan:     Visit Diagnoses: Pseudogout: She has been taking colchicine 0.6 mg 1 tablet by mouth daily.  She presented today with pain and inflammation in the left knee joint which started about 1 month ago.  She did not have any injury or fall prior to the onset of symptoms.  She is not experiencing any mechanical symptoms in the left knee joint.  X-rays of the left knee were updated today which were consistent with osteoarthritis as well as chondrocalcinosis.  The left knee joint was injected with cortisone today as requested.  We discussed the bleeding risk since she is on warfarin.  She tolerated the procedure without any side effects.  Aftercare was discussed.  She will remain on colchicine as prescribed.  She was advised to notify us if she continues to have recurrent flares. She will follow up in 6 months.   Primary osteoarthritis of both hands: PIP and DIP thickening consistent with osteoarthritis of both hands.  No tenderness or synovitis was noted.  Complete fist formation bilaterally.  Discussed the importance of joint protection and muscle strengthening.  Chronic left shoulder pain: She continues to  have persistent discomfort in the left shoulder joint.  She has painful range of motion and limited abduction to about 90 degrees.  She has an orthopedist she has been following along closely with.  Chronic pain of left knee - She presents today with increased pain in the left knee joint, which started about 1 month ago.  She did not have any injury or fall prior to the onset of symptoms.  X-rays of the left  knee were updated today.  The left knee was injected with cortisone today.  She tolerated the procedure well.  Aftercare was discussed. Plan: XR KNEE 3 VIEW LEFT  Primary osteoarthritis of both knees - She experiences intermittent discomfort in both knee joints.  Today she presents with discomfort in the left knee joint which started 1 month ago.  X-rays of the left knee were updated today.  Left knee was injected with cortisone as discussed above.  Procedure note was completed above.  Plan: XR KNEE 3 VIEW LEFT  DDD (degenerative disc disease), cervical: She has slightly limited range of motion with lateral rotation.  No symptoms of radiculopathy at this time.  She has trapezius muscle tension and muscle tenderness bilaterally.  DDD (degenerative disc disease), lumbar: Chronic pain.  She experiences occasional symptoms of right-sided sciatica.  She had an MRI of the lumbar spine on 04/20/2020 which revealed lumbar spondylosis, mild multilevel spondylolisthesis, and degenerative disc disease.  Fibromyalgia: She has generalized hyperalgesia and positive tender points on examination today.  She continues to have generalized myalgias and muscle tenderness due to underlying fibromyalgia.  According to the patient her fibromyalgia flared after being diagnosed with COVID in May 2022.  We discussed the importance of regular exercise and good sleep hygiene.  Age-related osteoporosis without current pathological fracture: No recent DEXA available in epic to review.  We called her PCPs office to obtain most  recent DEXA: 10/29/19: BMD measured at forearm radius is 0.543 with T score -2.4.  According to the patient her PCP started her on Fosamax 70 mg 1 tablet by mouth once weekly.  She is taking a calcium and vitamin D supplement on a daily basis.  She has not had any recent falls or fractures.  She continues to use a cane to assist with ambulation.  Other medical conditions are listed as follows:  History of CHF (congestive heart failure)  History of COPD  History of pulmonary embolism  Chronic anticoagulation  History of glaucoma  History of sleep apnea  Osteopenia of multiple sites  Orders: Orders Placed This Encounter  Procedures   Large Joint Inj   XR KNEE 3 VIEW LEFT    No orders of the defined types were placed in this encounter.    Follow-Up Instructions: Return in about 6 months (around 09/03/2021) for Pseudogout , Osteoarthritis, Fibromyalgia, Osteoporosis.   Ofilia Neas, PA-C  Note - This record has been created using Dragon software.  Chart creation errors have been sought, but may not always  have been located. Such creation errors do not reflect on  the standard of medical care.

## 2021-03-03 NOTE — Patient Instructions (Signed)
Journal for Nurse Practitioners, 15(4), 263-267. Retrieved June 09, 2018 from http://clinicalkey.com/nursing">  Knee Exercises Ask your health care provider which exercises are safe for you. Do exercises exactly as told by your health care provider and adjust them as directed. It is normal to feel mild stretching, pulling, tightness, or discomfort as you do these exercises. Stop right away if you feel sudden pain or your pain gets worse. Do not begin these exercises until told by your health care provider. Stretching and range-of-motion exercises These exercises warm up your muscles and joints and improve the movement and flexibility of your knee. These exercises also help to relieve pain andswelling. Knee extension, prone Lie on your abdomen (prone position) on a bed. Place your left / right knee just beyond the edge of the surface so your knee is not on the bed. You can put a towel under your left / right thigh just above your kneecap for comfort. Relax your leg muscles and allow gravity to straighten your knee (extension). You should feel a stretch behind your left / right knee. Hold this position for __________ seconds. Scoot up so your knee is supported between repetitions. Repeat __________ times. Complete this exercise __________ times a day. Knee flexion, active  Lie on your back with both legs straight. If this causes back discomfort, bend your left / right knee so your foot is flat on the floor. Slowly slide your left / right heel back toward your buttocks. Stop when you feel a gentle stretch in the front of your knee or thigh (flexion). Hold this position for __________ seconds. Slowly slide your left / right heel back to the starting position. Repeat __________ times. Complete this exercise __________ times a day. Quadriceps stretch, prone  Lie on your abdomen on a firm surface, such as a bed or padded floor. Bend your left / right knee and hold your ankle. If you cannot reach  your ankle or pant leg, loop a belt around your foot and grab the belt instead. Gently pull your heel toward your buttocks. Your knee should not slide out to the side. You should feel a stretch in the front of your thigh and knee (quadriceps). Hold this position for __________ seconds. Repeat __________ times. Complete this exercise __________ times a day. Hamstring, supine Lie on your back (supine position). Loop a belt or towel over the ball of your left / right foot. The ball of your foot is on the walking surface, right under your toes. Straighten your left / right knee and slowly pull on the belt to raise your leg until you feel a gentle stretch behind your knee (hamstring). Do not let your knee bend while you do this. Keep your other leg flat on the floor. Hold this position for __________ seconds. Repeat __________ times. Complete this exercise __________ times a day. Strengthening exercises These exercises build strength and endurance in your knee. Endurance is theability to use your muscles for a long time, even after they get tired. Quadriceps, isometric This exercise stretches the muscles in front of your thigh (quadriceps) without moving your knee joint (isometric). Lie on your back with your left / right leg extended and your other knee bent. Put a rolled towel or small pillow under your knee if told by your health care provider. Slowly tense the muscles in the front of your left / right thigh. You should see your kneecap slide up toward your hip or see increased dimpling just above the knee. This motion will   push the back of the knee toward the floor. For __________ seconds, hold the muscle as tight as you can without increasing your pain. Relax the muscles slowly and completely. Repeat __________ times. Complete this exercise __________ times a day. Straight leg raises This exercise stretches the muscles in front of your thigh (quadriceps) and the muscles that move your hips (hip  flexors). Lie on your back with your left / right leg extended and your other knee bent. Tense the muscles in the front of your left / right thigh. You should see your kneecap slide up or see increased dimpling just above the knee. Your thigh may even shake a bit. Keep these muscles tight as you raise your leg 4-6 inches (10-15 cm) off the floor. Do not let your knee bend. Hold this position for __________ seconds. Keep these muscles tense as you lower your leg. Relax your muscles slowly and completely after each repetition. Repeat __________ times. Complete this exercise __________ times a day. Hamstring, isometric Lie on your back on a firm surface. Bend your left / right knee about __________ degrees. Dig your left / right heel into the surface as if you are trying to pull it toward your buttocks. Tighten the muscles in the back of your thighs (hamstring) to "dig" as hard as you can without increasing any pain. Hold this position for __________ seconds. Release the tension gradually and allow your muscles to relax completely for __________ seconds after each repetition. Repeat __________ times. Complete this exercise __________ times a day. Hamstring curls If told by your health care provider, do this exercise while wearing ankle weights. Begin with __________ lb weights. Then increase the weight by 1 lb (0.5 kg) increments. Do not wear ankle weights that are more than __________ lb. Lie on your abdomen with your legs straight. Bend your left / right knee as far as you can without feeling pain. Keep your hips flat against the floor. Hold this position for __________ seconds. Slowly lower your leg to the starting position. Repeat __________ times. Complete this exercise __________ times a day. Squats This exercise strengthens the muscles in front of your thigh and knee (quadriceps). Stand in front of a table, with your feet and knees pointing straight ahead. You may rest your hands on the  table for balance but not for support. Slowly bend your knees and lower your hips like you are going to sit in a chair. Keep your weight over your heels, not over your toes. Keep your lower legs upright so they are parallel with the table legs. Do not let your hips go lower than your knees. Do not bend lower than told by your health care provider. If your knee pain increases, do not bend as low. Hold the squat position for __________ seconds. Slowly push with your legs to return to standing. Do not use your hands to pull yourself to standing. Repeat __________ times. Complete this exercise __________ times a day. Wall slides This exercise strengthens the muscles in front of your thigh and knee (quadriceps). Lean your back against a smooth wall or door, and walk your feet out 18-24 inches (46-61 cm) from it. Place your feet hip-width apart. Slowly slide down the wall or door until your knees bend __________ degrees. Keep your knees over your heels, not over your toes. Keep your knees in line with your hips. Hold this position for __________ seconds. Repeat __________ times. Complete this exercise __________ times a day. Straight leg raises This exercise   strengthens the muscles that rotate the leg at the hip and move it away from your body (hip abductors). Lie on your side with your left / right leg in the top position. Lie so your head, shoulder, knee, and hip line up. You may bend your bottom knee to help you keep your balance. Roll your hips slightly forward so your hips are stacked directly over each other and your left / right knee is facing forward. Leading with your heel, lift your top leg 4-6 inches (10-15 cm). You should feel the muscles in your outer hip lifting. Do not let your foot drift forward. Do not let your knee roll toward the ceiling. Hold this position for __________ seconds. Slowly return your leg to the starting position. Let your muscles relax completely after each  repetition. Repeat __________ times. Complete this exercise __________ times a day. Straight leg raises This exercise stretches the muscles that move your hips away from the front of the pelvis (hip extensors). Lie on your abdomen on a firm surface. You can put a pillow under your hips if that is more comfortable. Tense the muscles in your buttocks and lift your left / right leg about 4-6 inches (10-15 cm). Keep your knee straight as you lift your leg. Hold this position for __________ seconds. Slowly lower your leg to the starting position. Let your leg relax completely after each repetition. Repeat __________ times. Complete this exercise __________ times a day. This information is not intended to replace advice given to you by your health care provider. Make sure you discuss any questions you have with your healthcare provider. Document Revised: 06/10/2018 Document Reviewed: 06/10/2018 [employer] Patient Education  2022 [employer]  

## 2021-03-05 DIAGNOSIS — M545 Low back pain, unspecified: Secondary | ICD-10-CM | POA: Diagnosis not present

## 2021-03-05 DIAGNOSIS — I110 Hypertensive heart disease with heart failure: Secondary | ICD-10-CM | POA: Diagnosis not present

## 2021-03-05 DIAGNOSIS — M2578 Osteophyte, vertebrae: Secondary | ICD-10-CM | POA: Diagnosis not present

## 2021-03-05 DIAGNOSIS — S40022A Contusion of left upper arm, initial encounter: Secondary | ICD-10-CM | POA: Diagnosis not present

## 2021-03-05 DIAGNOSIS — M79602 Pain in left arm: Secondary | ICD-10-CM | POA: Diagnosis not present

## 2021-03-05 DIAGNOSIS — M47816 Spondylosis without myelopathy or radiculopathy, lumbar region: Secondary | ICD-10-CM | POA: Diagnosis not present

## 2021-03-05 DIAGNOSIS — S0990XA Unspecified injury of head, initial encounter: Secondary | ICD-10-CM | POA: Diagnosis not present

## 2021-03-05 DIAGNOSIS — M4683 Other specified inflammatory spondylopathies, cervicothoracic region: Secondary | ICD-10-CM | POA: Diagnosis not present

## 2021-03-05 DIAGNOSIS — M542 Cervicalgia: Secondary | ICD-10-CM | POA: Diagnosis not present

## 2021-03-05 DIAGNOSIS — W1839XA Other fall on same level, initial encounter: Secondary | ICD-10-CM | POA: Diagnosis not present

## 2021-03-05 DIAGNOSIS — S098XXA Other specified injuries of head, initial encounter: Secondary | ICD-10-CM | POA: Diagnosis not present

## 2021-03-05 DIAGNOSIS — M5136 Other intervertebral disc degeneration, lumbar region: Secondary | ICD-10-CM | POA: Diagnosis not present

## 2021-03-05 DIAGNOSIS — M47812 Spondylosis without myelopathy or radiculopathy, cervical region: Secondary | ICD-10-CM | POA: Diagnosis not present

## 2021-03-05 DIAGNOSIS — M4050 Lordosis, unspecified, site unspecified: Secondary | ICD-10-CM | POA: Diagnosis not present

## 2021-03-05 DIAGNOSIS — I509 Heart failure, unspecified: Secondary | ICD-10-CM | POA: Diagnosis not present

## 2021-03-05 DIAGNOSIS — J449 Chronic obstructive pulmonary disease, unspecified: Secondary | ICD-10-CM | POA: Diagnosis not present

## 2021-03-05 DIAGNOSIS — M79622 Pain in left upper arm: Secondary | ICD-10-CM | POA: Diagnosis not present

## 2021-03-05 DIAGNOSIS — S199XXA Unspecified injury of neck, initial encounter: Secondary | ICD-10-CM | POA: Diagnosis not present

## 2021-03-10 DIAGNOSIS — Z7901 Long term (current) use of anticoagulants: Secondary | ICD-10-CM | POA: Diagnosis not present

## 2021-03-13 DIAGNOSIS — M545 Low back pain, unspecified: Secondary | ICD-10-CM | POA: Diagnosis not present

## 2021-03-29 DIAGNOSIS — M546 Pain in thoracic spine: Secondary | ICD-10-CM | POA: Diagnosis not present

## 2021-03-29 DIAGNOSIS — M9903 Segmental and somatic dysfunction of lumbar region: Secondary | ICD-10-CM | POA: Diagnosis not present

## 2021-03-29 DIAGNOSIS — M542 Cervicalgia: Secondary | ICD-10-CM | POA: Diagnosis not present

## 2021-03-29 DIAGNOSIS — M9901 Segmental and somatic dysfunction of cervical region: Secondary | ICD-10-CM | POA: Diagnosis not present

## 2021-03-29 DIAGNOSIS — M9902 Segmental and somatic dysfunction of thoracic region: Secondary | ICD-10-CM | POA: Diagnosis not present

## 2021-03-31 DIAGNOSIS — Z7901 Long term (current) use of anticoagulants: Secondary | ICD-10-CM | POA: Diagnosis not present

## 2021-04-11 ENCOUNTER — Other Ambulatory Visit: Payer: Self-pay | Admitting: Family Medicine

## 2021-04-12 DIAGNOSIS — R269 Unspecified abnormalities of gait and mobility: Secondary | ICD-10-CM | POA: Diagnosis not present

## 2021-04-12 DIAGNOSIS — M5416 Radiculopathy, lumbar region: Secondary | ICD-10-CM | POA: Diagnosis not present

## 2021-04-12 DIAGNOSIS — M25519 Pain in unspecified shoulder: Secondary | ICD-10-CM | POA: Diagnosis not present

## 2021-04-12 DIAGNOSIS — M25579 Pain in unspecified ankle and joints of unspecified foot: Secondary | ICD-10-CM | POA: Diagnosis not present

## 2021-04-12 DIAGNOSIS — M797 Fibromyalgia: Secondary | ICD-10-CM | POA: Diagnosis not present

## 2021-04-12 DIAGNOSIS — M79622 Pain in left upper arm: Secondary | ICD-10-CM | POA: Diagnosis not present

## 2021-04-12 DIAGNOSIS — M545 Low back pain, unspecified: Secondary | ICD-10-CM | POA: Diagnosis not present

## 2021-04-12 DIAGNOSIS — G5603 Carpal tunnel syndrome, bilateral upper limbs: Secondary | ICD-10-CM | POA: Diagnosis not present

## 2021-04-12 DIAGNOSIS — M25562 Pain in left knee: Secondary | ICD-10-CM | POA: Diagnosis not present

## 2021-04-12 DIAGNOSIS — Z79891 Long term (current) use of opiate analgesic: Secondary | ICD-10-CM | POA: Diagnosis not present

## 2021-04-12 DIAGNOSIS — M5010 Cervical disc disorder with radiculopathy, unspecified cervical region: Secondary | ICD-10-CM | POA: Diagnosis not present

## 2021-04-14 DIAGNOSIS — Z7901 Long term (current) use of anticoagulants: Secondary | ICD-10-CM | POA: Diagnosis not present

## 2021-04-17 DIAGNOSIS — M25512 Pain in left shoulder: Secondary | ICD-10-CM | POA: Diagnosis not present

## 2021-04-17 DIAGNOSIS — G8929 Other chronic pain: Secondary | ICD-10-CM | POA: Diagnosis not present

## 2021-05-03 DIAGNOSIS — R509 Fever, unspecified: Secondary | ICD-10-CM | POA: Diagnosis not present

## 2021-05-03 DIAGNOSIS — I10 Essential (primary) hypertension: Secondary | ICD-10-CM | POA: Diagnosis not present

## 2021-05-03 DIAGNOSIS — R0902 Hypoxemia: Secondary | ICD-10-CM | POA: Diagnosis not present

## 2021-05-03 DIAGNOSIS — U071 COVID-19: Secondary | ICD-10-CM | POA: Diagnosis not present

## 2021-05-05 DIAGNOSIS — Z7901 Long term (current) use of anticoagulants: Secondary | ICD-10-CM | POA: Diagnosis not present

## 2021-05-10 DIAGNOSIS — M546 Pain in thoracic spine: Secondary | ICD-10-CM | POA: Diagnosis not present

## 2021-05-10 DIAGNOSIS — M542 Cervicalgia: Secondary | ICD-10-CM | POA: Diagnosis not present

## 2021-05-10 DIAGNOSIS — M9903 Segmental and somatic dysfunction of lumbar region: Secondary | ICD-10-CM | POA: Diagnosis not present

## 2021-05-10 DIAGNOSIS — M9901 Segmental and somatic dysfunction of cervical region: Secondary | ICD-10-CM | POA: Diagnosis not present

## 2021-05-10 DIAGNOSIS — M9902 Segmental and somatic dysfunction of thoracic region: Secondary | ICD-10-CM | POA: Diagnosis not present

## 2021-05-16 DIAGNOSIS — M542 Cervicalgia: Secondary | ICD-10-CM | POA: Diagnosis not present

## 2021-05-16 DIAGNOSIS — I10 Essential (primary) hypertension: Secondary | ICD-10-CM | POA: Diagnosis not present

## 2021-05-19 DIAGNOSIS — Z7901 Long term (current) use of anticoagulants: Secondary | ICD-10-CM | POA: Diagnosis not present

## 2021-06-02 DIAGNOSIS — U071 COVID-19: Secondary | ICD-10-CM | POA: Diagnosis not present

## 2021-06-02 DIAGNOSIS — R509 Fever, unspecified: Secondary | ICD-10-CM | POA: Diagnosis not present

## 2021-06-02 DIAGNOSIS — I10 Essential (primary) hypertension: Secondary | ICD-10-CM | POA: Diagnosis not present

## 2021-06-02 DIAGNOSIS — R0902 Hypoxemia: Secondary | ICD-10-CM | POA: Diagnosis not present

## 2021-06-07 DIAGNOSIS — Z7901 Long term (current) use of anticoagulants: Secondary | ICD-10-CM | POA: Diagnosis not present

## 2021-06-07 DIAGNOSIS — E039 Hypothyroidism, unspecified: Secondary | ICD-10-CM | POA: Diagnosis not present

## 2021-06-07 DIAGNOSIS — M545 Low back pain, unspecified: Secondary | ICD-10-CM | POA: Diagnosis not present

## 2021-06-07 DIAGNOSIS — R7303 Prediabetes: Secondary | ICD-10-CM | POA: Diagnosis not present

## 2021-06-07 DIAGNOSIS — Z0000 Encounter for general adult medical examination without abnormal findings: Secondary | ICD-10-CM | POA: Diagnosis not present

## 2021-06-07 DIAGNOSIS — E7849 Other hyperlipidemia: Secondary | ICD-10-CM | POA: Diagnosis not present

## 2021-06-07 DIAGNOSIS — I10 Essential (primary) hypertension: Secondary | ICD-10-CM | POA: Diagnosis not present

## 2021-06-07 DIAGNOSIS — M818 Other osteoporosis without current pathological fracture: Secondary | ICD-10-CM | POA: Diagnosis not present

## 2021-06-09 ENCOUNTER — Encounter (INDEPENDENT_AMBULATORY_CARE_PROVIDER_SITE_OTHER): Payer: Self-pay | Admitting: *Deleted

## 2021-06-19 DIAGNOSIS — D485 Neoplasm of uncertain behavior of skin: Secondary | ICD-10-CM | POA: Diagnosis not present

## 2021-06-20 DIAGNOSIS — M542 Cervicalgia: Secondary | ICD-10-CM | POA: Diagnosis not present

## 2021-06-20 DIAGNOSIS — M4712 Other spondylosis with myelopathy, cervical region: Secondary | ICD-10-CM | POA: Diagnosis not present

## 2021-06-21 DIAGNOSIS — M62830 Muscle spasm of back: Secondary | ICD-10-CM | POA: Diagnosis not present

## 2021-06-21 DIAGNOSIS — M542 Cervicalgia: Secondary | ICD-10-CM | POA: Diagnosis not present

## 2021-06-21 DIAGNOSIS — M9901 Segmental and somatic dysfunction of cervical region: Secondary | ICD-10-CM | POA: Diagnosis not present

## 2021-06-21 DIAGNOSIS — M546 Pain in thoracic spine: Secondary | ICD-10-CM | POA: Diagnosis not present

## 2021-06-21 DIAGNOSIS — M9902 Segmental and somatic dysfunction of thoracic region: Secondary | ICD-10-CM | POA: Diagnosis not present

## 2021-06-21 DIAGNOSIS — M9903 Segmental and somatic dysfunction of lumbar region: Secondary | ICD-10-CM | POA: Diagnosis not present

## 2021-07-03 DIAGNOSIS — R509 Fever, unspecified: Secondary | ICD-10-CM | POA: Diagnosis not present

## 2021-07-03 DIAGNOSIS — U071 COVID-19: Secondary | ICD-10-CM | POA: Diagnosis not present

## 2021-07-03 DIAGNOSIS — I10 Essential (primary) hypertension: Secondary | ICD-10-CM | POA: Diagnosis not present

## 2021-07-03 DIAGNOSIS — R0902 Hypoxemia: Secondary | ICD-10-CM | POA: Diagnosis not present

## 2021-07-06 DIAGNOSIS — R269 Unspecified abnormalities of gait and mobility: Secondary | ICD-10-CM | POA: Diagnosis not present

## 2021-07-06 DIAGNOSIS — M25579 Pain in unspecified ankle and joints of unspecified foot: Secondary | ICD-10-CM | POA: Diagnosis not present

## 2021-07-06 DIAGNOSIS — M25562 Pain in left knee: Secondary | ICD-10-CM | POA: Diagnosis not present

## 2021-07-06 DIAGNOSIS — Z79891 Long term (current) use of opiate analgesic: Secondary | ICD-10-CM | POA: Diagnosis not present

## 2021-07-06 DIAGNOSIS — M545 Low back pain, unspecified: Secondary | ICD-10-CM | POA: Diagnosis not present

## 2021-07-06 DIAGNOSIS — M797 Fibromyalgia: Secondary | ICD-10-CM | POA: Diagnosis not present

## 2021-07-06 DIAGNOSIS — M25519 Pain in unspecified shoulder: Secondary | ICD-10-CM | POA: Diagnosis not present

## 2021-07-06 DIAGNOSIS — M79622 Pain in left upper arm: Secondary | ICD-10-CM | POA: Diagnosis not present

## 2021-07-06 DIAGNOSIS — M5416 Radiculopathy, lumbar region: Secondary | ICD-10-CM | POA: Diagnosis not present

## 2021-07-06 DIAGNOSIS — G5603 Carpal tunnel syndrome, bilateral upper limbs: Secondary | ICD-10-CM | POA: Diagnosis not present

## 2021-07-06 DIAGNOSIS — M5010 Cervical disc disorder with radiculopathy, unspecified cervical region: Secondary | ICD-10-CM | POA: Diagnosis not present

## 2021-07-11 ENCOUNTER — Encounter (INDEPENDENT_AMBULATORY_CARE_PROVIDER_SITE_OTHER): Payer: Self-pay | Admitting: *Deleted

## 2021-07-14 DIAGNOSIS — Z7901 Long term (current) use of anticoagulants: Secondary | ICD-10-CM | POA: Diagnosis not present

## 2021-07-14 DIAGNOSIS — D485 Neoplasm of uncertain behavior of skin: Secondary | ICD-10-CM | POA: Diagnosis not present

## 2021-07-17 DIAGNOSIS — S40261A Insect bite (nonvenomous) of right shoulder, initial encounter: Secondary | ICD-10-CM | POA: Diagnosis not present

## 2021-07-17 DIAGNOSIS — D485 Neoplasm of uncertain behavior of skin: Secondary | ICD-10-CM | POA: Diagnosis not present

## 2021-07-20 DIAGNOSIS — M19012 Primary osteoarthritis, left shoulder: Secondary | ICD-10-CM | POA: Diagnosis not present

## 2021-07-20 DIAGNOSIS — G8929 Other chronic pain: Secondary | ICD-10-CM | POA: Diagnosis not present

## 2021-07-20 NOTE — Progress Notes (Signed)
Cardiology Office Note  Date: 07/21/2021   ID: RUHANI UMLAND, DOB 1950/12/10, MRN [id number]  PCP:  Neale Burly, MD  Cardiologist:  None Electrophysiologist:  None   Chief Complaint: 6 month follow up  History of Present Illness: Dorothy Ford is a 70 y.o. female with a history of PSVT status post SVT ablation in 1998, sinus tachycardia, palpitations, hypertension, chronic diastolic heart failure, COPD, PE, anxiety, panic attacks.  Carotid artery bruits, chronic venous insufficiency / LE edema  Previous nuclear stress test in 2018 was normal.  EF greater than 65%  Last seen by Dr. Bronson Ing 12/09/2019.  Her chronic exertional dyspnea was stable.  She denied any palpitations or chest pain.  Primary complaints were related to bony arthritic pain including left knee, left shoulder, lower back, and cervical spine.  She continued with chronic lower extremity edema secondary to venous insufficiency.  She denied any chest pain.  Stated she only had palpitations when she had a panic attack.  She was symptomatically stable on Toprol-XL 25 mg daily.  She was euvolemic on Lasix 40 mg daily.  She remained on chronic anticoagulation via warfarin for history of pulmonary embolism.  Blood pressure was normal and there were no changes to therapy.  She was prescribed knee-high compression stockings for bilateral lower extremity edema/chronic venous insufficiency.  Recently admitted to [facility] on 01/28/2021 for COVID-pneumonia.  She had a prior history of recent few days with feeling hot, fever, fatigue, headache, nausea, cough.  She had a positive COVID test at home.  She had exposure from her sister.  She was being treated with Rocephin, dexamethasone, remdesivir, vitamin D and C with zinc.   At last visit she was status post COVID-pneumonia on 01/28/2021.  She still had some lingering effects with cough and shortness of breath associated with COVID-pneumonia.  Otherwise do not  any anginal symptoms, orthostatic symptoms, CVA or TIA-like symptoms, claudication-like symptoms, bleeding, DVT or PE-like symptoms, or lower extremity edema.  She is here today for 74-monthfollow-up.  Blood pressure initially was 174/108.  Recheck in right arm BP was 134/80.  States she has been having some issues with arthritis and multiple joint pains.  She denies any DOE or SOB, PND, orthopnea, weight gain.  In fact she has lost about 16 pounds since July.  She states she is compliant with all of her medications.  Denies any significant palpitations other than occasional palpitations at night.  She is compliant with all of her cardiac medications including metoprolol 25 mg daily, potassium 30 mEq daily, Coumadin 2 mg as directed by PCP INR is with PCP.  She continues on amlodipine 5 mg daily, atorvastatin 40 mg daily, Lasix 40 mg daily.   Past Medical History:  Diagnosis Date   Acid reflux    Asthma    Cardiac dysrhythmia, unspecified    Carotid bruit    Bilateral   Cataract    Chest pain, unspecified    Chronic airway obstruction, not elsewhere classified    Chronic sinusitis    Congestive heart failure, unspecified    DDD (degenerative disc disease), cervical 07/24/2016   DDD (degenerative disc disease), lumbar 07/24/2016   Depressive disorder, not elsewhere classified    Diaphragmatic hernia without mention of obstruction or gangrene    Edema    left leg   Fibromyalgia    High cholesterol    History of rheumatoid arthritis    Neuropathy    Obesity, unspecified  Obstructive lung disease (generalized) ([place])    Osteoarthritis of both hands 07/24/2016   Osteopenia    Osteoporosis 07/24/2016   Palpitations    Pseudogout involving multiple joints    SVT (supraventricular tachycardia) ([place])    Syncope    admitted 09/2011   Thyroid disease    Unspecified essential hypertension     Past Surgical History:  Procedure Laterality Date   CARPAL TUNNEL RELEASE     CATHETER  ABLATION  02/1997   @ [facility]   VEIN SURGERY BLADDER      Current Outpatient Medications  Medication Sig Dispense Refill   albuterol (PROVENTIL HFA;VENTOLIN HFA) 108 (90 Base) MCG/ACT inhaler Inhale into the lungs every 6 (six) hours as needed for wheezing or shortness of breath.     alendronate (FOSAMAX) 70 MG tablet Take 70 mg by mouth once a week. Take with a full glass of water on an empty stomach.     amLODipine (NORVASC) 5 MG tablet Take 5 mg by mouth daily.     ascorbic acid (VITAMIN C) 500 MG tablet Take 500 mg by mouth daily.     atorvastatin (LIPITOR) 40 MG tablet Take 40 mg by mouth daily.     azelastine (ASTELIN) 0.1 % nasal spray Place 1 spray into both nostrils daily.     Calcium Carb-Cholecalciferol (CALCIUM 600 + D PO) Take by mouth.     cetirizine (ZYRTEC) 10 MG tablet Take 10 mg by mouth daily.     clonazePAM (KLONOPIN) 0.5 MG tablet Take 0.5 mg by mouth at bedtime.      COLCRYS 0.6 MG tablet TAKE 1 TABLET BY MOUTH ONCE DAILY. 30 tablet 0   cycloSPORINE (RESTASIS) 0.05 % ophthalmic emulsion Place 1 drop into both eyes 2 (two) times daily.     doxycycline (VIBRA-TABS) 100 MG tablet Take 100 mg by mouth 2 (two) times daily.     Elastic Bandages & Supports (MEDICAL COMPRESSION STOCKINGS) MISC 1 each by Does not apply route as directed. 1 pair low pressure knee high compression stockings Dx: leg edema 1 each 0   famotidine (PEPCID) 20 MG tablet Take 20 mg by mouth 2 (two) times daily.     furosemide (LASIX) 40 MG tablet TAKE 1 TABLET BY MOUTH ONCE DAILY. 30 tablet 6   gabapentin (NEURONTIN) 300 MG capsule Take 300 mg by mouth 3 (three) times daily.      guaiFENesin (MUCINEX) 600 MG 12 hr tablet Take 600 mg by mouth 2 (two) times daily as needed.     ipratropium-albuterol (DUONEB) 0.5-2.5 (3) MG/3ML SOLN Take 3 mLs by nebulization every 6 (six) hours as needed.      levalbuterol (XOPENEX HFA) 45 MCG/ACT inhaler Inhale 2 puffs into the lungs every 6 (six) hours as needed for  wheezing.     levothyroxine (SYNTHROID, LEVOTHROID) 50 MCG tablet Take 50 mcg by mouth daily before breakfast.      loperamide (IMODIUM A-D) 2 MG tablet Take 2 mg by mouth 4 (four) times daily as needed for diarrhea or loose stools.     metoprolol tartrate (LOPRESSOR) 25 MG tablet Take 25 mg by mouth daily.     montelukast (SINGULAIR) 10 MG tablet Take 1 tablet by mouth daily.     naloxone (NARCAN) 4 MG/0.1ML LIQD nasal spray kit Narcan 4 mg/actuation nasal spray     nystatin (MYCOSTATIN) powder Apply topically as needed.     ondansetron (ZOFRAN) 4 MG tablet as needed.      oxyCODONE (  OXY IR/ROXICODONE) 5 MG immediate release tablet as needed.      pantoprazole (PROTONIX) 40 MG tablet Take 40 mg by mouth daily.     PARoxetine (PAXIL) 30 MG tablet Take 30 mg by mouth daily.     Sennosides (SENOKOT PO) Take by mouth in the morning and at bedtime.      Simethicone 125 MG CAPS Take 2 capsules by mouth as needed.     SYMBICORT 160-4.5 MCG/ACT inhaler Inhale 2 puffs into the lungs 2 (two) times daily.     warfarin (COUMADIN) 2 MG tablet Take 2 mg by mouth as directed. Managed by PCP     potassium chloride (K-DUR) 10 MEQ tablet Take 3 tablets (30 mEq total) by mouth daily. 90 tablet 6   No current facility-administered medications for this visit.   Allergies:  Adhesive [tape] and Codeine   Social History: The patient  reports that she has never smoked. She has never used smokeless tobacco. She reports that she does not drink alcohol and does not use drugs.   Family History: The patient's family history includes Asthma in her sister; Heart disease in her father and mother; Hypertension in her father and mother; Kidney failure in her sister; Stroke in an other family member.   ROS:  Please see the history of present illness. Otherwise, complete review of systems is positive for none.  All other systems are reviewed and negative.   Physical Exam: VS:  BP (!) 174/108   Pulse 76   Ht 5' 1"  (1.549  m)   Wt 188 lb 12.8 oz (85.6 kg)   SpO2 94%   BMI 35.67 kg/m , BMI Body mass index is 35.67 kg/m.  Wt Readings from Last 3 Encounters:  07/21/21 188 lb 12.8 oz (85.6 kg)  03/03/21 204 lb 9.6 oz (92.8 kg)  02/28/21 203 lb 3.2 oz (92.2 kg)    General: Patient appears comfortable at rest. Neck: Supple, no elevated JVP or carotid bruits, no thyromegaly. Lungs: Clear to auscultation, nonlabored breathing at rest. Cardiac: Regular rate and rhythm, no S3 or significant systolic murmur, no pericardial rub. Extremities: Chronic mild pitting edema, wearing compression stockings, distal pulses 2+. Skin: Warm and dry. Musculoskeletal: No kyphosis. Neuropsychiatric: Alert and oriented x3, affect grossly appropriate.  ECG: EKG at [facility] on 01/30/2021 normal sinus rhythm nonspecific T wave abnormality rate of 76.  Recent Labwork: No results found for requested labs within last 8760 hours.  No results found for: CHOL, TRIG, HDL, CHOLHDL, VLDL, LDLCALC, LDLDIRECT  Other Studies Reviewed Today:   Assessment and Plan:  1. Chronic diastolic heart failure ([place])   2. PSVT (paroxysmal supraventricular tachycardia) ([place])   3. Essential (primary) hypertension   4. Lower extremity edema   5. Pulmonary embolism, unspecified chronicity, unspecified pulmonary embolism type, unspecified whether acute cor pulmonale present ([place])   6. Mixed hyperlipidemia     1. Chronic diastolic heart failure (HCC) No clinical symptoms of heart failure.  She has actually lost about 16 pounds since prior weights in epic.  She does have some chronic dyspnea but no clinical signs of heart failure.  Lungs are clear to auscultation.  She has chronic lower extremity edema.  Continue Lasix 40 mg daily.  Continue potassium supplementation.  2. PSVT (paroxysmal supraventricular tachycardia) (HCC) Occasional palpitations in the evening which are not bothersome continue metoprolol 25 mg p.o. daily..  Denies any specific palpitations  or arrhythmias.  3. Essential (primary) hypertension Blood pressure well controlled at 130/80.  Continue amlodipine 5 mg daily.  Continue metoprolol 25 mg p.o. daily  4. Lower extremity edema Has chronic lower extremity and wears compression stockings/socks.  Continue Lasix 40 mg daily.  5. Pulmonary embolism, unspecified chronicity, unspecified pulmonary embolism type, unspecified whether acute cor pulmonale present ([place]) Continues on Coumadin for PE prophylaxis.  No DVT or PE-like symptoms.  States her recent INR was 1.8 and she will need to follow-up with her PCP for management. Blood pressure initially elevated at 174/108.  Recheck in right arm was 134/80. 6.  Hyperlipidemia Continue atorvastatin 40 mg p.o. daily.  Medication Adjustments/Labs and Tests Ordered: Current medicines are reviewed at length with the patient today.  Concerns regarding medicines are outlined above.   Disposition: Follow-up with Dr. Harl Bowie or APP 6 months  Signed, Levell July, NP 07/21/2021 11:37 AM    [facility] at [address], [place], [place] [address] Phone: [phone]; Fax: [phone]

## 2021-07-21 ENCOUNTER — Encounter: Payer: Self-pay | Admitting: Family Medicine

## 2021-07-21 ENCOUNTER — Ambulatory Visit (INDEPENDENT_AMBULATORY_CARE_PROVIDER_SITE_OTHER): Payer: Medicare Other | Admitting: Family Medicine

## 2021-07-21 VITALS — BP 134/80 | HR 76 | Ht 61.0 in | Wt 188.8 lb

## 2021-07-21 DIAGNOSIS — I5032 Chronic diastolic (congestive) heart failure: Secondary | ICD-10-CM | POA: Diagnosis not present

## 2021-07-21 DIAGNOSIS — I10 Essential (primary) hypertension: Secondary | ICD-10-CM | POA: Diagnosis not present

## 2021-07-21 DIAGNOSIS — I2699 Other pulmonary embolism without acute cor pulmonale: Secondary | ICD-10-CM | POA: Diagnosis not present

## 2021-07-21 DIAGNOSIS — I471 Supraventricular tachycardia: Secondary | ICD-10-CM

## 2021-07-21 DIAGNOSIS — E782 Mixed hyperlipidemia: Secondary | ICD-10-CM | POA: Diagnosis not present

## 2021-07-21 DIAGNOSIS — R600 Localized edema: Secondary | ICD-10-CM | POA: Diagnosis not present

## 2021-07-21 NOTE — Patient Instructions (Addendum)

## 2021-07-26 ENCOUNTER — Ambulatory Visit: Payer: Medicare Other | Admitting: Family Medicine

## 2021-07-26 DIAGNOSIS — M47816 Spondylosis without myelopathy or radiculopathy, lumbar region: Secondary | ICD-10-CM | POA: Diagnosis not present

## 2021-08-02 DIAGNOSIS — R509 Fever, unspecified: Secondary | ICD-10-CM | POA: Diagnosis not present

## 2021-08-02 DIAGNOSIS — I10 Essential (primary) hypertension: Secondary | ICD-10-CM | POA: Diagnosis not present

## 2021-08-02 DIAGNOSIS — R0902 Hypoxemia: Secondary | ICD-10-CM | POA: Diagnosis not present

## 2021-08-02 DIAGNOSIS — U071 COVID-19: Secondary | ICD-10-CM | POA: Diagnosis not present

## 2021-08-10 ENCOUNTER — Ambulatory Visit (INDEPENDENT_AMBULATORY_CARE_PROVIDER_SITE_OTHER): Payer: Medicare Other | Admitting: Gastroenterology

## 2021-08-10 ENCOUNTER — Encounter (INDEPENDENT_AMBULATORY_CARE_PROVIDER_SITE_OTHER): Payer: Self-pay

## 2021-08-10 ENCOUNTER — Telehealth (INDEPENDENT_AMBULATORY_CARE_PROVIDER_SITE_OTHER): Payer: Self-pay

## 2021-08-10 ENCOUNTER — Encounter (INDEPENDENT_AMBULATORY_CARE_PROVIDER_SITE_OTHER): Payer: Self-pay | Admitting: Gastroenterology

## 2021-08-10 ENCOUNTER — Other Ambulatory Visit: Payer: Self-pay

## 2021-08-10 ENCOUNTER — Other Ambulatory Visit (INDEPENDENT_AMBULATORY_CARE_PROVIDER_SITE_OTHER): Payer: Self-pay

## 2021-08-10 VITALS — BP 122/75 | HR 60 | Temp 98.4°F | Ht 61.0 in | Wt 188.0 lb

## 2021-08-10 DIAGNOSIS — R195 Other fecal abnormalities: Secondary | ICD-10-CM

## 2021-08-10 DIAGNOSIS — K625 Hemorrhage of anus and rectum: Secondary | ICD-10-CM

## 2021-08-10 MED ORDER — HYDROCORTISONE (PERIANAL) 2.5 % EX CREA: 1.0000 "application " | TOPICAL_CREAM | Freq: Two times a day (BID) | CUTANEOUS | 1 refills | Status: DC

## 2021-08-10 MED ORDER — PEG 3350-KCL-NA BICARB-NACL 420 G PO SOLR: 4000.0000 mL | ORAL | 0 refills | Status: DC

## 2021-08-10 NOTE — Patient Instructions (Signed)
I have sent a cream to your pharmacy to try for suspected hemorrhoids, you can also do sitz bath to help with itching and burning, please let me know if you develop anymore rectal bleeding.  We will get you scheduled for colonoscopy.  If diarrhea becomes more frequent, 3-5 episodes per day, let me know as we may need to evaluate for underlying infection, however, I suspect it is just some GI upset from your recent antibiotic. You can try taking an over the counter probiotic such as culturelle or align, these can be found at local pharmacy or [facility].  We will determine your follow up visit after your colonoscopy

## 2021-08-10 NOTE — Progress Notes (Signed)
Referring Provider: Neale Burly, MD Primary Care Physician:  Neale Burly, MD Primary GI Physician: new patient  Chief Complaint  Patient presents with   Rectal Bleeding    Patient here today with complaints of bright red blood on the tissue when wiping after a bm. Patient states this happened a couple of months ago, She has not seen any in the last month or so. Stools are soft and runny.   HPI:   Dorothy Ford is a 70 y.o. female with past medical history of asthma, DDD, CHF, depression, diaphragmatic hernia, fibromyalgia, high cholesterol, RA, neuropathy, OA, thyroid disease.  Patient presenting today as new patient for rectal bleeding.  Patient states that in September, she noticed a streak of blood on the toilet tissue after a BM. On her second BM that day, she noticed a little more blood on the tissue, however, she reports no blood in toilet or on stool. She has no further bleeding since then. She denies noticing blood in the toilet or mixed with her stools. She also endorses rectal itching and burning, she occasionally uses the hemorrhoid cream which helps with the itching. She is on warfarin daily. Sometimes she does have vaginal bleeding. She reports that stools are loose and sometimes watery, she may have 1-2 BMs per day. This has been ongoing for the past month since taking a course of doxycycline for a tick bite.  She has some abdominal discomfort and nausea sometimes when she has loose stools. Denies vomiting. Appetite is good, she reports some weight loss, stating she was 200 lbs about 2-3 years ago and is 188 today, appears to have been a slow trend downward, however, she has been around 188 for some time.   NSAID use:no nsaids Social hx:no etoh or tobacco  Fam hx: father had CRC in his 36s  Last Colonoscopy:2016, normal per patient. Dr Anthony Sar Last Endoscopy:unsure of timing, was r/t abdominal pain  Recommendations:  colonoscopy  Past Medical History:   Diagnosis Date   Acid reflux    Asthma    Cardiac dysrhythmia, unspecified    Carotid bruit    Bilateral   Cataract    Chest pain, unspecified    Chronic airway obstruction, not elsewhere classified    Chronic sinusitis    Congestive heart failure, unspecified    DDD (degenerative disc disease), cervical 07/24/2016   DDD (degenerative disc disease), lumbar 07/24/2016   Depressive disorder, not elsewhere classified    Diaphragmatic hernia without mention of obstruction or gangrene    Edema    left leg   Fibromyalgia    High cholesterol    History of rheumatoid arthritis    Neuropathy    Obesity, unspecified    Obstructive lung disease (generalized) ([place])    Osteoarthritis of both hands 07/24/2016   Osteopenia    Osteoporosis 07/24/2016   Palpitations    Pseudogout involving multiple joints    SVT (supraventricular tachycardia) ([place])    Syncope    admitted 09/2011   Thyroid disease    Unspecified essential hypertension     Past Surgical History:  Procedure Laterality Date   CARPAL TUNNEL RELEASE     CATHETER ABLATION  02/1997   @ [facility]   VEIN SURGERY BLADDER      Current Outpatient Medications  Medication Sig Dispense Refill   albuterol (PROVENTIL HFA;VENTOLIN HFA) 108 (90 Base) MCG/ACT inhaler Inhale into the lungs every 6 (six) hours as needed for wheezing or shortness of breath.  alendronate (FOSAMAX) 70 MG tablet Take 70 mg by mouth once a week. Take with a full glass of water on an empty stomach.     amLODipine (NORVASC) 5 MG tablet Take 5 mg by mouth daily.     ascorbic acid (VITAMIN C) 500 MG tablet Take 500 mg by mouth daily.     atorvastatin (LIPITOR) 40 MG tablet Take 40 mg by mouth daily.     azelastine (ASTELIN) 0.1 % nasal spray Place 1 spray into both nostrils daily.     Calcium Carb-Cholecalciferol (CALCIUM 600 + D PO) Take by mouth.     cetirizine (ZYRTEC) 10 MG tablet Take 10 mg by mouth daily.     clonazePAM (KLONOPIN) 0.5 MG tablet Take 0.5  mg by mouth at bedtime.      COLCRYS 0.6 MG tablet TAKE 1 TABLET BY MOUTH ONCE DAILY. 30 tablet 0   cycloSPORINE (RESTASIS) 0.05 % ophthalmic emulsion Place 1 drop into both eyes 2 (two) times daily.     Elastic Bandages & Supports (MEDICAL COMPRESSION STOCKINGS) MISC 1 each by Does not apply route as directed. 1 pair low pressure knee high compression stockings Dx: leg edema 1 each 0   famotidine (PEPCID) 20 MG tablet Take 20 mg by mouth 2 (two) times daily.     furosemide (LASIX) 40 MG tablet TAKE 1 TABLET BY MOUTH ONCE DAILY. 30 tablet 6   gabapentin (NEURONTIN) 300 MG capsule Take 300 mg by mouth 3 (three) times daily.      guaiFENesin (MUCINEX) 600 MG 12 hr tablet Take 600 mg by mouth 2 (two) times daily as needed.     ipratropium-albuterol (DUONEB) 0.5-2.5 (3) MG/3ML SOLN Take 3 mLs by nebulization every 6 (six) hours as needed.      levalbuterol (XOPENEX HFA) 45 MCG/ACT inhaler Inhale 2 puffs into the lungs every 6 (six) hours as needed for wheezing.     levothyroxine (SYNTHROID, LEVOTHROID) 50 MCG tablet Take 50 mcg by mouth daily before breakfast.      loperamide (IMODIUM A-D) 2 MG tablet Take 2 mg by mouth 4 (four) times daily as needed for diarrhea or loose stools.     metoprolol tartrate (LOPRESSOR) 25 MG tablet Take 25 mg by mouth daily.     montelukast (SINGULAIR) 10 MG tablet Take 1 tablet by mouth daily.     naloxone (NARCAN) 4 MG/0.1ML LIQD nasal spray kit Narcan 4 mg/actuation nasal spray     nystatin (MYCOSTATIN) powder Apply topically as needed.     ondansetron (ZOFRAN) 4 MG tablet as needed.      oxyCODONE (OXY IR/ROXICODONE) 5 MG immediate release tablet as needed.      pantoprazole (PROTONIX) 40 MG tablet Take 40 mg by mouth daily.     PARoxetine (PAXIL) 30 MG tablet Take 30 mg by mouth daily.     potassium chloride (K-DUR) 10 MEQ tablet Take 3 tablets (30 mEq total) by mouth daily. 90 tablet 6   Sennosides (SENOKOT PO) Take by mouth in the morning and at bedtime.       Simethicone 125 MG CAPS Take 2 capsules by mouth as needed.     SYMBICORT 160-4.5 MCG/ACT inhaler Inhale 2 puffs into the lungs 2 (two) times daily.     warfarin (COUMADIN) 2 MG tablet Take 2 mg by mouth as directed. Managed by PCP     No current facility-administered medications for this visit.    Allergies as of 08/10/2021 - Review Complete 08/10/2021  Allergen Reaction Noted   Adhesive [tape] Rash 10/09/2011   Codeine Nausea And Vomiting     Family History  Problem Relation Age of Onset   Heart disease Mother    Hypertension Mother    Stroke Other    Heart disease Father    Hypertension Father    Kidney failure Sister    Asthma Sister     Social History   Socioeconomic History   Marital status: Single    Spouse name: Not on file   Number of children: Not on file   Years of education: Not on file   Highest education level: Not on file  Occupational History   Not on file  Tobacco Use   Smoking status: Never   Smokeless tobacco: Never  Vaping Use   Vaping Use: Never used  Substance and Sexual Activity   Alcohol use: No    Alcohol/week: 0.0 standard drinks   Drug use: Never   Sexual activity: Not on file  Other Topics Concern   Not on file  Social History Narrative   Not on file   Social Determinants of Health   Financial Resource Strain: Not on file  Food Insecurity: Not on file  Transportation Needs: Not on file  Physical Activity: Not on file  Stress: Not on file  Social Connections: Not on file   Review of systems General: negative for malaise, night sweats, fever, chills, weight loss Neck: Negative for lumps, goiter, pain and significant neck swelling Resp: Negative for cough, wheezing, dyspnea at rest CV: Negative for chest pain, leg swelling, palpitations, orthopnea GI: denies melena,  nausea, vomiting,  constipation, dysphagia, odyonophagia, early satiety or unintentional weight loss. +diarrhea +rectal bleeding MSK: Negative for joint pain or  swelling, back pain, and muscle pain. Derm: Negative for itching or rash Psych: Denies depression, anxiety, memory loss, confusion. No homicidal or suicidal ideation.  Heme: Negative for prolonged bleeding, bruising easily, and swollen nodes. Endocrine: Negative for cold or heat intolerance, polyuria, polydipsia and goiter. Neuro: negative for tremor, gait imbalance, syncope and seizures. The remainder of the review of systems is noncontributory.  Physical Exam: BP 122/75 (BP Location: Right Arm, Patient Position: Sitting, Cuff Size: Large)   Pulse 60   Temp 98.4 F (36.9 C) (Oral)   Ht _0  (1.549 m)   Wt 188 lb (85.3 kg)   BMI 35.52 kg/m  General:   Alert and oriented. No distress noted. Pleasant and cooperative.  Head:  Normocephalic and atraumatic. Eyes:  Conjuctiva clear without scleral icterus. Mouth:  Oral mucosa pink and moist. Good dentition. No lesions. Heart: Normal rate and rhythm, s1 and s2 heart sounds present.  Lungs: Clear lung sounds in all lobes. Respirations equal and unlabored. Abdomen:  +BS, soft, non-tender and non-distended. No rebound or guarding. No HSM or masses noted. Derm: No palmar erythema or jaundice Msk:  Symmetrical without gross deformities. Normal posture. Extremities:  Without edema. Neurologic:  Alert and  oriented x4 Psych:  Alert and cooperative. Normal mood and affect.  Invalid input(s): 6 MONTHS   ASSESSMENT: Dorothy Ford is a 70 y.o. female presenting today as a new patient for rectal bleeding.  Reassuring, patient reports only two episodes of tissue hematochezia, first one being a small streak of blood after BM and the second being a little larger amount of bright red blood on the paper. These occurred back in September, she has had no further episodes since then. She denies fatigue, dizziness, weakness, or sob. She  also endorses rectal burning and itching which she occasionally uses preparation H cream for with some relief. She has  had no significant weight loss, changes in appetite, melena, vomiting or significant abdominal pain. Her last colonoscopy was in 2016, she thinks it was normal, however, I am unable to review records regarding this or recommendations for appropriate timing of next colonoscopy. I suspect her rectal bleeding was secondary to hemorrhoids, however, given she is on coumadin and has not had a colonoscopy in atleast 7 years, we will get her set up for one as we cannot rule out AVMs, bleeding polyps or even malignancy, though this is less likely given her benign presentation. I am also sending anusol cream for her to use for her hemorrhoids. As far as her loose stools/diarrhea, I suspect this could be secondary to recent antibiotic, I am not overly concerned for infectious etiology given her frequency of BMs is only 1-2x/day. I advised her to start a probiotic to try and regulate good bacteria in her GI tract s/p antibiotic therapy, she will let me know if she develops worsening diarrhea with episodes more than 3x/day or any new GI symptoms. We will proceed with stool studies if this occurs.    PLAN:  Anusol Rx, BID x10 days then up to twice daily PRN thereafter 2. Schedule colonoscopy 3. Start probiotic to help with loose stools s/p abx therapy 4. Stool studies if diarrhea worsens  Indications, risks and benefits of procedure discussed in detail with patient. Patient verbalized understanding and is in agreement to proceed with colonoscopy at this time.   Patient is on coumadin, will need to be held appropriate amount of time prior to colonoscopy   Follow Up: TBD after colonoscopy  Pierre Dellarocco L. Alver Sorrow, MSN, APRN, AGNP-C Adult-Gerontology Nurse Practitioner [facility]

## 2021-08-10 NOTE — Telephone Encounter (Signed)
LeighAnn Ylonda Storr, CMA  

## 2021-08-10 NOTE — H&P (View-Only) (Signed)
 Referring Provider: Hasanaj, Xaje A, MD Primary Care Physician:  Hasanaj, Xaje A, MD Primary GI Physician: new patient  Chief Complaint  Patient presents with   Rectal Bleeding    Patient here today with complaints of bright red blood on the tissue when wiping after a bm. Patient states this happened a couple of months ago, She has not seen any in the last month or so. Stools are soft and runny.   HPI:   Dorothy Ford is a 70 y.o. female with past medical history of asthma, DDD, CHF, depression, diaphragmatic hernia, fibromyalgia, high cholesterol, RA, neuropathy, OA, thyroid disease.  Patient presenting today as new patient for rectal bleeding.  Patient states that in September, she noticed a streak of blood on the toilet tissue after a BM. On her second BM that day, she noticed a little more blood on the tissue, however, she reports no blood in toilet or on stool. She has no further bleeding since then. She denies noticing blood in the toilet or mixed with her stools. She also endorses rectal itching and burning, she occasionally uses the hemorrhoid cream which helps with the itching. She is on warfarin daily. Sometimes she does have vaginal bleeding. She reports that stools are loose and sometimes watery, she may have 1-2 BMs per day. This has been ongoing for the past month since taking a course of doxycycline for a tick bite.  She has some abdominal discomfort and nausea sometimes when she has loose stools. Denies vomiting. Appetite is good, she reports some weight loss, stating she was 200 lbs about 2-3 years ago and is 188 today, appears to have been a slow trend downward, however, she has been around 188 for some time.   NSAID use:no nsaids Social hx:no etoh or tobacco  Fam hx: father had CRC in his 70s  Last Colonoscopy:2016, normal per patient. Dr DeMason Last Endoscopy:unsure of timing, was r/t abdominal pain  Recommendations:  colonoscopy  Past Medical History:   Diagnosis Date   Acid reflux    Asthma    Cardiac dysrhythmia, unspecified    Carotid bruit    Bilateral   Cataract    Chest pain, unspecified    Chronic airway obstruction, not elsewhere classified    Chronic sinusitis    Congestive heart failure, unspecified    DDD (degenerative disc disease), cervical 07/24/2016   DDD (degenerative disc disease), lumbar 07/24/2016   Depressive disorder, not elsewhere classified    Diaphragmatic hernia without mention of obstruction or gangrene    Edema    left leg   Fibromyalgia    High cholesterol    History of rheumatoid arthritis    Neuropathy    Obesity, unspecified    Obstructive lung disease (generalized) (HCC)    Osteoarthritis of both hands 07/24/2016   Osteopenia    Osteoporosis 07/24/2016   Palpitations    Pseudogout involving multiple joints    SVT (supraventricular tachycardia) (HCC)    Syncope    admitted 09/2011   Thyroid disease    Unspecified essential hypertension     Past Surgical History:  Procedure Laterality Date   CARPAL TUNNEL RELEASE     CATHETER ABLATION  02/1997   @ [facility]   VEIN SURGERY BLADDER      Current Outpatient Medications  Medication Sig Dispense Refill   albuterol (PROVENTIL HFA;VENTOLIN HFA) 108 (90 Base) MCG/ACT inhaler Inhale into the lungs every 6 (six) hours as needed for wheezing or shortness of breath.       alendronate (FOSAMAX) 70 MG tablet Take 70 mg by mouth once a week. Take with a full glass of water on an empty stomach.     amLODipine (NORVASC) 5 MG tablet Take 5 mg by mouth daily.     ascorbic acid (VITAMIN C) 500 MG tablet Take 500 mg by mouth daily.     atorvastatin (LIPITOR) 40 MG tablet Take 40 mg by mouth daily.     azelastine (ASTELIN) 0.1 % nasal spray Place 1 spray into both nostrils daily.     Calcium Carb-Cholecalciferol (CALCIUM 600 + D PO) Take by mouth.     cetirizine (ZYRTEC) 10 MG tablet Take 10 mg by mouth daily.     clonazePAM (KLONOPIN) 0.5 MG tablet Take 0.5  mg by mouth at bedtime.      COLCRYS 0.6 MG tablet TAKE 1 TABLET BY MOUTH ONCE DAILY. 30 tablet 0   cycloSPORINE (RESTASIS) 0.05 % ophthalmic emulsion Place 1 drop into both eyes 2 (two) times daily.     Elastic Bandages & Supports (MEDICAL COMPRESSION STOCKINGS) MISC 1 each by Does not apply route as directed. 1 pair low pressure knee high compression stockings Dx: leg edema 1 each 0   famotidine (PEPCID) 20 MG tablet Take 20 mg by mouth 2 (two) times daily.     furosemide (LASIX) 40 MG tablet TAKE 1 TABLET BY MOUTH ONCE DAILY. 30 tablet 6   gabapentin (NEURONTIN) 300 MG capsule Take 300 mg by mouth 3 (three) times daily.      guaiFENesin (MUCINEX) 600 MG 12 hr tablet Take 600 mg by mouth 2 (two) times daily as needed.     ipratropium-albuterol (DUONEB) 0.5-2.5 (3) MG/3ML SOLN Take 3 mLs by nebulization every 6 (six) hours as needed.      levalbuterol (XOPENEX HFA) 45 MCG/ACT inhaler Inhale 2 puffs into the lungs every 6 (six) hours as needed for wheezing.     levothyroxine (SYNTHROID, LEVOTHROID) 50 MCG tablet Take 50 mcg by mouth daily before breakfast.      loperamide (IMODIUM A-D) 2 MG tablet Take 2 mg by mouth 4 (four) times daily as needed for diarrhea or loose stools.     metoprolol tartrate (LOPRESSOR) 25 MG tablet Take 25 mg by mouth daily.     montelukast (SINGULAIR) 10 MG tablet Take 1 tablet by mouth daily.     naloxone (NARCAN) 4 MG/0.1ML LIQD nasal spray kit Narcan 4 mg/actuation nasal spray     nystatin (MYCOSTATIN) powder Apply topically as needed.     ondansetron (ZOFRAN) 4 MG tablet as needed.      oxyCODONE (OXY IR/ROXICODONE) 5 MG immediate release tablet as needed.      pantoprazole (PROTONIX) 40 MG tablet Take 40 mg by mouth daily.     PARoxetine (PAXIL) 30 MG tablet Take 30 mg by mouth daily.     potassium chloride (K-DUR) 10 MEQ tablet Take 3 tablets (30 mEq total) by mouth daily. 90 tablet 6   Sennosides (SENOKOT PO) Take by mouth in the morning and at bedtime.       Simethicone 125 MG CAPS Take 2 capsules by mouth as needed.     SYMBICORT 160-4.5 MCG/ACT inhaler Inhale 2 puffs into the lungs 2 (two) times daily.     warfarin (COUMADIN) 2 MG tablet Take 2 mg by mouth as directed. Managed by PCP     No current facility-administered medications for this visit.    Allergies as of 08/10/2021 - Review Complete 08/10/2021    Allergen Reaction Noted   Adhesive [tape] Rash 10/09/2011   Codeine Nausea And Vomiting     Family History  Problem Relation Age of Onset   Heart disease Mother    Hypertension Mother    Stroke Other    Heart disease Father    Hypertension Father    Kidney failure Sister    Asthma Sister     Social History   Socioeconomic History   Marital status: Single    Spouse name: Not on file   Number of children: Not on file   Years of education: Not on file   Highest education level: Not on file  Occupational History   Not on file  Tobacco Use   Smoking status: Never   Smokeless tobacco: Never  Vaping Use   Vaping Use: Never used  Substance and Sexual Activity   Alcohol use: No    Alcohol/week: 0.0 standard drinks   Drug use: Never   Sexual activity: Not on file  Other Topics Concern   Not on file  Social History Narrative   Not on file   Social Determinants of Health   Financial Resource Strain: Not on file  Food Insecurity: Not on file  Transportation Needs: Not on file  Physical Activity: Not on file  Stress: Not on file  Social Connections: Not on file   Review of systems General: negative for malaise, night sweats, fever, chills, weight loss Neck: Negative for lumps, goiter, pain and significant neck swelling Resp: Negative for cough, wheezing, dyspnea at rest CV: Negative for chest pain, leg swelling, palpitations, orthopnea GI: denies melena,  nausea, vomiting,  constipation, dysphagia, odyonophagia, early satiety or unintentional weight loss. +diarrhea +rectal bleeding MSK: Negative for joint pain or  swelling, back pain, and muscle pain. Derm: Negative for itching or rash Psych: Denies depression, anxiety, memory loss, confusion. No homicidal or suicidal ideation.  Heme: Negative for prolonged bleeding, bruising easily, and swollen nodes. Endocrine: Negative for cold or heat intolerance, polyuria, polydipsia and goiter. Neuro: negative for tremor, gait imbalance, syncope and seizures. The remainder of the review of systems is noncontributory.  Physical Exam: BP 122/75 (BP Location: Right Arm, Patient Position: Sitting, Cuff Size: Large)   Pulse 60   Temp 98.4 F (36.9 C) (Oral)   Ht 5' 1" (1.549 m)   Wt 188 lb (85.3 kg)   BMI 35.52 kg/m  General:   Alert and oriented. No distress noted. Pleasant and cooperative.  Head:  Normocephalic and atraumatic. Eyes:  Conjuctiva clear without scleral icterus. Mouth:  Oral mucosa pink and moist. Good dentition. No lesions. Heart: Normal rate and rhythm, s1 and s2 heart sounds present.  Lungs: Clear lung sounds in all lobes. Respirations equal and unlabored. Abdomen:  +BS, soft, non-tender and non-distended. No rebound or guarding. No HSM or masses noted. Derm: No palmar erythema or jaundice Msk:  Symmetrical without gross deformities. Normal posture. Extremities:  Without edema. Neurologic:  Alert and  oriented x4 Psych:  Alert and cooperative. Normal mood and affect.  Invalid input(s): 6 MONTHS   ASSESSMENT: Dorothy Ford is a 70 y.o. female presenting today as a new patient for rectal bleeding.  Reassuring, patient reports only two episodes of tissue hematochezia, first one being a small streak of blood after BM and the second being a little larger amount of bright red blood on the paper. These occurred back in September, she has had no further episodes since then. She denies fatigue, dizziness, weakness, or sob. She   also endorses rectal burning and itching which she occasionally uses preparation H cream for with some relief. She has  had no significant weight loss, changes in appetite, melena, vomiting or significant abdominal pain. Her last colonoscopy was in 2016, she thinks it was normal, however, I am unable to review records regarding this or recommendations for appropriate timing of next colonoscopy. I suspect her rectal bleeding was secondary to hemorrhoids, however, given she is on coumadin and has not had a colonoscopy in atleast 7 years, we will get her set up for one as we cannot rule out AVMs, bleeding polyps or even malignancy, though this is less likely given her benign presentation. I am also sending anusol cream for her to use for her hemorrhoids. As far as her loose stools/diarrhea, I suspect this could be secondary to recent antibiotic, I am not overly concerned for infectious etiology given her frequency of BMs is only 1-2x/day. I advised her to start a probiotic to try and regulate good bacteria in her GI tract s/p antibiotic therapy, she will let me know if she develops worsening diarrhea with episodes more than 3x/day or any new GI symptoms. We will proceed with stool studies if this occurs.    PLAN:  Anusol Rx, BID x10 days then up to twice daily PRN thereafter 2. Schedule colonoscopy 3. Start probiotic to help with loose stools s/p abx therapy 4. Stool studies if diarrhea worsens  Indications, risks and benefits of procedure discussed in detail with patient. Patient verbalized understanding and is in agreement to proceed with colonoscopy at this time.   Patient is on coumadin, will need to be held appropriate amount of time prior to colonoscopy   Follow Up: TBD after colonoscopy  Jahnya Trindade L. Yosiah Jasmin, MSN, APRN, AGNP-C Adult-Gerontology Nurse Practitioner [facility]  

## 2021-08-11 ENCOUNTER — Encounter (INDEPENDENT_AMBULATORY_CARE_PROVIDER_SITE_OTHER): Payer: Self-pay

## 2021-08-11 DIAGNOSIS — Z7901 Long term (current) use of anticoagulants: Secondary | ICD-10-CM | POA: Diagnosis not present

## 2021-08-14 NOTE — Patient Instructions (Addendum)
Dorothy Ford  08/14/2021     @PREFPERIOPPHARMACY @   Your procedure is scheduled on  08/18/2021.   Report to [facility] at  1100  A.M.   Call this number if you have problems the morning of surgery:  [phone]   Remember:  Follow the diet and prep instructions given to you by the office.    YOur last dose of coumadin should be 08/12/2021.    Use your nebulizer and your inhalers before you come and bring your rescue inhaler with you.    Take these medicines the morning of surgery with A SIP OF WATER       amlodipine, zyrtec, cilcrys, pepcid, gabapentin, levothyroxine, metoprolol, singulair, zofran (if needed), oxycodone (if needed), protonix, paxil.     Do not wear jewelry, make-up or nail polish.  Do not wear lotions, powders, or perfumes, or deodorant.  Do not shave 48 hours prior to surgery.  Men may shave face and neck.  Do not bring valuables to the hospital.  [facility] is not responsible for any belongings or valuables.  Contacts, dentures or bridgework may not be worn into surgery.  Leave your suitcase in the car.  After surgery it may be brought to your room.  For patients admitted to the hospital, discharge time will be determined by your treatment team.  Patients discharged the day of surgery will not be allowed to drive home and must have someone with them for 24 hours.    Special instructions:   DO NOT smoke tobacco or vape for 24 hours before your procedure.  Please read over the following fact sheets that you were given. Anesthesia Post-op Instructions and Care and Recovery After Surgery         Colonoscopy, Adult, Care After This sheet gives you information about how to care for yourself after your procedure. Your health care provider may also give you more specific instructions. If you have problems or questions, contact your health care provider. What can I expect after the procedure? After the procedure, it is common to have: A  small amount of blood in your stool for 24 hours after the procedure. Some gas. Mild cramping or bloating of your abdomen. Follow these instructions at home: Eating and drinking  Drink enough fluid to keep your urine pale yellow. Follow instructions from your health care provider about eating or drinking restrictions. Resume your normal diet as instructed by your health care provider. Avoid heavy or fried foods that are hard to digest. Activity Rest as told by your health care provider. Avoid sitting for a long time without moving. Get up to take short walks every 1-2 hours. This is important to improve blood flow and breathing. Ask for help if you feel weak or unsteady. Return to your normal activities as told by your health care provider. Ask your health care provider what activities are safe for you. Managing cramping and bloating  Try walking around when you have cramps or feel bloated. Apply heat to your abdomen as told by your health care provider. Use the heat source that your health care provider recommends, such as a moist heat pack or a heating pad. Place a towel between your skin and the heat source. Leave the heat on for 20-30 minutes. Remove the heat if your skin turns bright red. This is especially important if you are unable to feel pain, heat, or cold. You may have a greater risk of getting burned. General  instructions If you were given a sedative during the procedure, it can affect you for several hours. Do not drive or operate machinery until your health care provider says that it is safe. For the first 24 hours after the procedure: Do not sign important documents. Do not drink alcohol. Do your regular daily activities at a slower pace than normal. Eat soft foods that are easy to digest. Take over-the-counter and prescription medicines only as told by your health care provider. Keep all follow-up visits as told by your health care provider. This is important. Contact a  health care provider if: You have blood in your stool 2-3 days after the procedure. Get help right away if you have: More than a small spotting of blood in your stool. Large blood clots in your stool. Swelling of your abdomen. Nausea or vomiting. A fever. Increasing pain in your abdomen that is not relieved with medicine. Summary After the procedure, it is common to have a small amount of blood in your stool. You may also have mild cramping and bloating of your abdomen. If you were given a sedative during the procedure, it can affect you for several hours. Do not drive or operate machinery until your health care provider says that it is safe. Get help right away if you have a lot of blood in your stool, nausea or vomiting, a fever, or increased pain in your abdomen. This information is not intended to replace advice given to you by your health care provider. Make sure you discuss any questions you have with your health care provider. Document Revised: 06/26/2019 Document Reviewed: 03/16/2019 Elsevier Patient Education  [place] After This sheet gives you information about how to care for yourself after your procedure. Your health care provider may also give you more specific instructions. If you have problems or questions, contact your health care provider. What can I expect after the procedure? After the procedure, it is common to have: Tiredness. Forgetfulness about what happened after the procedure. Impaired judgment for important decisions. Nausea or vomiting. Some difficulty with balance. Follow these instructions at home: For the time period you were told by your health care provider:   Rest as needed. Do not participate in activities where you could fall or become injured. Do not drive or use machinery. Do not drink alcohol. Do not take sleeping pills or medicines that cause drowsiness. Do not make important decisions or sign legal  documents. Do not take care of children on your own. Eating and drinking Follow the diet that is recommended by your health care provider. Drink enough fluid to keep your urine pale yellow. If you vomit: Drink water, juice, or soup when you can drink without vomiting. Make sure you have little or no nausea before eating solid foods. General instructions Have a responsible adult stay with you for the time you are told. It is important to have someone help care for you until you are awake and alert. Take over-the-counter and prescription medicines only as told by your health care provider. If you have sleep apnea, surgery and certain medicines can increase your risk for breathing problems. Follow instructions from your health care provider about wearing your sleep device: Anytime you are sleeping, including during daytime naps. While taking prescription pain medicines, sleeping medicines, or medicines that make you drowsy. Avoid smoking. Keep all follow-up visits as told by your health care provider. This is important. Contact a health care provider if: You keep feeling  nauseous or you keep vomiting. You feel light-headed. You are still sleepy or having trouble with balance after 24 hours. You develop a rash. You have a fever. You have redness or swelling around the IV site. Get help right away if: You have trouble breathing. You have new-onset confusion at home. Summary For several hours after your procedure, you may feel tired. You may also be forgetful and have poor judgment. Have a responsible adult stay with you for the time you are told. It is important to have someone help care for you until you are awake and alert. Rest as told. Do not drive or operate machinery. Do not drink alcohol or take sleeping pills. Get help right away if you have trouble breathing, or if you suddenly become confused. This information is not intended to replace advice given to you by your health care  provider. Make sure you discuss any questions you have with your health care provider. Document Revised: 05/05/2020 Document Reviewed: 07/23/2019 Elsevier Patient Education  2022 [employer].

## 2021-08-15 ENCOUNTER — Telehealth (INDEPENDENT_AMBULATORY_CARE_PROVIDER_SITE_OTHER): Payer: Self-pay

## 2021-08-15 NOTE — Telephone Encounter (Signed)
Yes, that would be OK, coumadin should not be held for more than 5 days for any patient for colonoscopy Thanks

## 2021-08-15 NOTE — Telephone Encounter (Signed)
Per Dr Sherrie Sport on 08/14/21 its ok for Dorothy Ford to hold her coumadin 5 days prior to procedure.

## 2021-08-16 ENCOUNTER — Other Ambulatory Visit: Payer: Self-pay

## 2021-08-16 ENCOUNTER — Encounter (HOSPITAL_COMMUNITY): Payer: Self-pay

## 2021-08-16 ENCOUNTER — Encounter (HOSPITAL_COMMUNITY)
Admission: RE | Admit: 2021-08-16 | Discharge: 2021-08-16 | Disposition: A | Discharge status: Home / Self Care | Payer: Medicare Other | Source: Ambulatory Visit | Attending: Gastroenterology | Admitting: Gastroenterology

## 2021-08-16 DIAGNOSIS — R195 Other fecal abnormalities: Secondary | ICD-10-CM | POA: Diagnosis not present

## 2021-08-16 DIAGNOSIS — Z01812 Encounter for preprocedural laboratory examination: Secondary | ICD-10-CM | POA: Insufficient documentation

## 2021-08-16 DIAGNOSIS — K625 Hemorrhage of anus and rectum: Secondary | ICD-10-CM | POA: Insufficient documentation

## 2021-08-16 HISTORY — DX: Sleep apnea, unspecified: G47.30

## 2021-08-16 HISTORY — DX: Hypothyroidism, unspecified: E03.9

## 2021-08-16 LAB — BASIC METABOLIC PANEL
Anion gap: 8 (ref 5–15)
BUN: 10 mg/dL (ref 8–23)
CO2: 31 mmol/L (ref 22–32)
Calcium: 8.5 mg/dL — ABNORMAL LOW (ref 8.9–10.3)
Chloride: 102 mmol/L (ref 98–111)
Creatinine, Ser: 0.69 mg/dL (ref 0.44–1.00)
GFR, Estimated: >60 mL/min (ref >60)
Glucose, Bld: 94 mg/dL (ref 70–99)
Potassium: 4 mmol/L (ref 3.5–5.1)
Sodium: 141 mmol/L (ref 135–145)

## 2021-08-18 ENCOUNTER — Ambulatory Visit (HOSPITAL_COMMUNITY): Payer: Medicare Other | Admitting: Anesthesiology

## 2021-08-18 ENCOUNTER — Encounter (HOSPITAL_COMMUNITY): Payer: Self-pay | Admitting: Gastroenterology

## 2021-08-18 ENCOUNTER — Ambulatory Visit (HOSPITAL_COMMUNITY)
Admission: RE | Admit: 2021-08-18 | Discharge: 2021-08-18 | Disposition: A | Discharge status: Home / Self Care | Payer: Medicare Other | Attending: Gastroenterology | Admitting: Gastroenterology

## 2021-08-18 ENCOUNTER — Encounter (HOSPITAL_COMMUNITY)
Admission: RE | Disposition: A | Discharge status: Home / Self Care | Payer: Self-pay | Source: Home / Self Care | Attending: Gastroenterology

## 2021-08-18 DIAGNOSIS — E079 Disorder of thyroid, unspecified: Secondary | ICD-10-CM | POA: Diagnosis not present

## 2021-08-18 DIAGNOSIS — G4730 Sleep apnea, unspecified: Secondary | ICD-10-CM | POA: Diagnosis not present

## 2021-08-18 DIAGNOSIS — R195 Other fecal abnormalities: Secondary | ICD-10-CM

## 2021-08-18 DIAGNOSIS — M069 Rheumatoid arthritis, unspecified: Secondary | ICD-10-CM | POA: Insufficient documentation

## 2021-08-18 DIAGNOSIS — K648 Other hemorrhoids: Secondary | ICD-10-CM | POA: Diagnosis not present

## 2021-08-18 DIAGNOSIS — F419 Anxiety disorder, unspecified: Secondary | ICD-10-CM | POA: Insufficient documentation

## 2021-08-18 DIAGNOSIS — F32A Depression, unspecified: Secondary | ICD-10-CM | POA: Insufficient documentation

## 2021-08-18 DIAGNOSIS — K449 Diaphragmatic hernia without obstruction or gangrene: Secondary | ICD-10-CM | POA: Diagnosis not present

## 2021-08-18 DIAGNOSIS — R0609 Other forms of dyspnea: Secondary | ICD-10-CM | POA: Insufficient documentation

## 2021-08-18 DIAGNOSIS — G629 Polyneuropathy, unspecified: Secondary | ICD-10-CM | POA: Insufficient documentation

## 2021-08-18 DIAGNOSIS — Z79891 Long term (current) use of opiate analgesic: Secondary | ICD-10-CM | POA: Insufficient documentation

## 2021-08-18 DIAGNOSIS — D128 Benign neoplasm of rectum: Secondary | ICD-10-CM | POA: Diagnosis not present

## 2021-08-18 DIAGNOSIS — K625 Hemorrhage of anus and rectum: Secondary | ICD-10-CM | POA: Diagnosis not present

## 2021-08-18 DIAGNOSIS — I471 Supraventricular tachycardia: Secondary | ICD-10-CM | POA: Diagnosis not present

## 2021-08-18 DIAGNOSIS — R0602 Shortness of breath: Secondary | ICD-10-CM | POA: Insufficient documentation

## 2021-08-18 DIAGNOSIS — M797 Fibromyalgia: Secondary | ICD-10-CM | POA: Insufficient documentation

## 2021-08-18 DIAGNOSIS — D124 Benign neoplasm of descending colon: Secondary | ICD-10-CM | POA: Diagnosis not present

## 2021-08-18 DIAGNOSIS — J449 Chronic obstructive pulmonary disease, unspecified: Secondary | ICD-10-CM | POA: Insufficient documentation

## 2021-08-18 DIAGNOSIS — D125 Benign neoplasm of sigmoid colon: Secondary | ICD-10-CM | POA: Diagnosis not present

## 2021-08-18 DIAGNOSIS — I509 Heart failure, unspecified: Secondary | ICD-10-CM | POA: Diagnosis not present

## 2021-08-18 DIAGNOSIS — D123 Benign neoplasm of transverse colon: Secondary | ICD-10-CM | POA: Insufficient documentation

## 2021-08-18 DIAGNOSIS — D126 Benign neoplasm of colon, unspecified: Secondary | ICD-10-CM | POA: Diagnosis not present

## 2021-08-18 DIAGNOSIS — I110 Hypertensive heart disease with heart failure: Secondary | ICD-10-CM | POA: Insufficient documentation

## 2021-08-18 DIAGNOSIS — E7800 Pure hypercholesterolemia, unspecified: Secondary | ICD-10-CM | POA: Insufficient documentation

## 2021-08-18 DIAGNOSIS — Z7901 Long term (current) use of anticoagulants: Secondary | ICD-10-CM | POA: Insufficient documentation

## 2021-08-18 DIAGNOSIS — D122 Benign neoplasm of ascending colon: Secondary | ICD-10-CM | POA: Insufficient documentation

## 2021-08-18 DIAGNOSIS — M1990 Unspecified osteoarthritis, unspecified site: Secondary | ICD-10-CM | POA: Diagnosis not present

## 2021-08-18 DIAGNOSIS — I5032 Chronic diastolic (congestive) heart failure: Secondary | ICD-10-CM | POA: Diagnosis not present

## 2021-08-18 DIAGNOSIS — G709 Myoneural disorder, unspecified: Secondary | ICD-10-CM | POA: Insufficient documentation

## 2021-08-18 DIAGNOSIS — K621 Rectal polyp: Secondary | ICD-10-CM

## 2021-08-18 DIAGNOSIS — K219 Gastro-esophageal reflux disease without esophagitis: Secondary | ICD-10-CM | POA: Insufficient documentation

## 2021-08-18 DIAGNOSIS — K635 Polyp of colon: Secondary | ICD-10-CM | POA: Diagnosis not present

## 2021-08-18 HISTORY — PX: COLONOSCOPY WITH PROPOFOL: SHX5780

## 2021-08-18 HISTORY — PX: POLYPECTOMY: SHX5525

## 2021-08-18 LAB — HM COLONOSCOPY

## 2021-08-18 SURGERY — COLONOSCOPY WITH PROPOFOL
Anesthesia: General

## 2021-08-18 MED ORDER — PROPOFOL 10 MG/ML IV BOLUS
INTRAVENOUS | Status: DC | PRN
  Administered 2021-08-18: 100 mg via INTRAVENOUS

## 2021-08-18 MED ORDER — PROPOFOL 500 MG/50ML IV EMUL
INTRAVENOUS | Status: AC
  Filled 2021-08-18: qty 50

## 2021-08-18 MED ORDER — PROPOFOL 500 MG/50ML IV EMUL
INTRAVENOUS | Status: DC | PRN
  Administered 2021-08-18: 150 ug/kg/min via INTRAVENOUS

## 2021-08-18 MED ORDER — LACTATED RINGERS IV SOLN: INTRAVENOUS | Status: DC

## 2021-08-18 MED ORDER — SPOT INK MARKER SYRINGE KIT
PACK | SUBMUCOSAL | Status: AC
  Filled 2021-08-18: qty 5

## 2021-08-18 MED ORDER — PHENYLEPHRINE HCL (PRESSORS) 10 MG/ML IV SOLN
INTRAVENOUS | Status: DC | PRN
  Administered 2021-08-18 (×3): 80 ug via INTRAVENOUS

## 2021-08-18 MED ORDER — PROPOFOL 10 MG/ML IV BOLUS
INTRAVENOUS | Status: AC
  Filled 2021-08-18: qty 40

## 2021-08-18 NOTE — Anesthesia Postprocedure Evaluation (Signed)
Anesthesia Post Note  Patient: Dorothy Ford  Procedure(s) Performed: COLONOSCOPY WITH PROPOFOL POLYPECTOMY  Patient location during evaluation: Phase II Anesthesia Type: General Level of consciousness: awake and alert and oriented Pain management: pain level controlled Vital Signs Assessment: post-procedure vital signs reviewed and stable Respiratory status: spontaneous breathing, nonlabored ventilation and respiratory function stable Cardiovascular status: blood pressure returned to baseline and stable Postop Assessment: no apparent nausea or vomiting Anesthetic complications: no   No notable events documented.   Last Vitals:  Vitals:   08/18/21 1146 08/18/21 1356  BP: (!) (P) 142/84 111/66  Pulse:  79  Resp:  20  Temp:  36.7 C  SpO2:  97%    Last Pain:  Vitals:   08/18/21 1356  TempSrc: Oral  PainSc: 0-No pain                 Cairo Lingenfelter C Kaimana Neuzil

## 2021-08-18 NOTE — Op Note (Signed)
[facility] Patient Name: Dorothy Ford Procedure Date: 08/18/2021 12:56 PM MRN: [id number] Date of Birth: 12-Feb-1951 Attending MD: Maylon Peppers ,  CSN: [id number] Age: 70 Admit Type: Outpatient Procedure:                Colonoscopy Indications:              Rectal bleeding Providers:                Maylon Peppers, Janeece Riggers, RN, Caprice Kluver,                            Suzan Garibaldi Risa Grill, Technician Referring MD:              Medicines:                Monitored Anesthesia Care Complications:            No immediate complications. Estimated Blood Loss:     Estimated blood loss: none. Procedure:                Pre-Anesthesia Assessment:                           - Prior to the procedure, a History and Physical                            was performed, and patient medications, allergies                            and sensitivities were reviewed. The patient's                            tolerance of previous anesthesia was reviewed.                           - The risks and benefits of the procedure and the                            sedation options and risks were discussed with the                            patient. All questions were answered and informed                            consent was obtained.                           - ASA Grade Assessment: II - A patient with mild                            systemic disease.                           After obtaining informed consent, the colonoscope                            was passed under direct vision. Throughout the  procedure, the patient's blood pressure, pulse, and                            oxygen saturations were monitored continuously. The                            [id number]-HQ190L ([id number]) scope was introduced through                            the anus and advanced to the the cecum, identified                            by appendiceal orifice and ileocecal valve. The                             colonoscopy was performed without difficulty. The                            patient tolerated the procedure well. The quality                            of the bowel preparation was adequate. Scope In: 1:16:01 PM Scope Out: 1:51:16 PM Scope Withdrawal Time: 0 hours 18 minutes 56 seconds  Total Procedure Duration: 0 hours 35 minutes 15 seconds  Findings:      The perianal and digital rectal examinations were normal.      Four sessile polyps were found in the transverse colon and ascending       colon. The polyps were 3 to 8 mm in size. These polyps were removed with       a cold snare. Resection and retrieval were complete.      Three sessile polyps were found in the rectum, sigmoid colon and       descending colon. The polyps were 3 to 8 mm in size. These polyps were       removed with a cold snare. Resection and retrieval were complete.      Non-bleeding internal hemorrhoids were found during retroflexion. The       hemorrhoids were small. Impression:               - Four 3 to 8 mm polyps in the transverse colon and                            in the ascending colon, removed with a cold snare.                            Resected and retrieved.                           - Three 3 to 8 mm polyps in the rectum, in the                            sigmoid colon and in the descending colon, removed  with a cold snare. Resected and retrieved.                           - Non-bleeding internal hemorrhoids. Moderate Sedation:      Per Anesthesia Care Recommendation:           - Discharge patient to home (ambulatory).                           - Resume previous diet.                           - Await pathology results.                           - Repeat colonoscopy for surveillance based on                            pathology results.                           - Will refer to Roseanne Kaufman, NP for hemorrhoidal                            banding. Procedure  Code(s):        --- Professional ---                           [phone], Colonoscopy, flexible; with removal of                            tumor(s), polyp(s), or other lesion(s) by snare                            technique Diagnosis Code(s):        --- Professional ---                           K62.1, Rectal polyp                           K63.5, Polyp of colon                           K64.8, Other hemorrhoids                           K62.5, Hemorrhage of anus and rectum CPT copyright 2019 [employer]. All rights reserved. The codes documented in this report are preliminary and upon coder review may  be revised to meet current compliance requirements. Maylon Peppers, MD Maylon Peppers,  08/18/2021 1:57:19 PM This report has been signed electronically. Number of Addenda: 0

## 2021-08-18 NOTE — Anesthesia Preprocedure Evaluation (Signed)
Anesthesia Evaluation  Patient identified by MRN, date of birth, ID band Patient awake    Reviewed: Allergy & Precautions, NPO status , Patient's Chart, lab work & pertinent test results, reviewed documented beta blocker date and time   Airway Mallampati: III  TM Distance: >3 FB Neck ROM: Full    Dental  (+) Dental Advisory Given, Missing,  Crowns :   Pulmonary shortness of breath and with exertion, asthma , sleep apnea , COPD,  COPD inhaler, PE   Pulmonary exam normal breath sounds clear to auscultation       Cardiovascular Exercise Tolerance: Poor hypertension, Pt. on medications and Pt. on home beta blockers +CHF and + DOE  Normal cardiovascular exam+ dysrhythmias Supra Ventricular Tachycardia  Rhythm:Regular Rate:Normal     Neuro/Psych PSYCHIATRIC DISORDERS Anxiety Depression  Neuromuscular disease    GI/Hepatic Neg liver ROS, hiatal hernia, GERD  Medicated and Controlled,  Endo/Other  Hypothyroidism   Renal/GU negative Renal ROS     Musculoskeletal  (+) Arthritis , Rheumatoid disorders,  Fibromyalgia -, narcotic dependent  Abdominal   Peds  Hematology   Anesthesia Other Findings   Reproductive/Obstetrics                            Anesthesia Physical Anesthesia Plan  ASA: 3  Anesthesia Plan: General   Post-op Pain Management: Minimal or no pain anticipated   Induction: Intravenous  PONV Risk Score and Plan: TIVA  Airway Management Planned: Nasal Cannula and Natural Airway  Additional Equipment:   Intra-op Plan:   Post-operative Plan:   Informed Consent: I have reviewed the patients History and Physical, chart, labs and discussed the procedure including the risks, benefits and alternatives for the proposed anesthesia with the patient or authorized representative who has indicated his/her understanding and acceptance.     Dental advisory given  Plan Discussed with: CRNA  and Surgeon  Anesthesia Plan Comments:         Anesthesia Quick Evaluation

## 2021-08-18 NOTE — Transfer of Care (Signed)
Immediate Anesthesia Transfer of Care Note  Patient: Dorothy Ford  Procedure(s) Performed: COLONOSCOPY WITH PROPOFOL POLYPECTOMY  Patient Location: Short Stay  Anesthesia Type:General  Level of Consciousness: awake  Airway & Oxygen Therapy: Patient Spontanous Breathing  Post-op Assessment: Report given to RN and Post -op Vital signs reviewed and stable  Post vital signs: Reviewed and stable  Last Vitals:  Vitals Value Taken Time  BP    Temp    Pulse    Resp    SpO2      Last Pain:  Vitals:   08/18/21 1315  TempSrc:   PainSc: 0-No pain      Patients Stated Pain Goal: 8 (19/80/22 1798)  Complications: No notable events documented.

## 2021-08-18 NOTE — Interval H&P Note (Signed)
History and Physical Interval Note:  08/18/2021 12:06 PM  Dorothy Ford  has presented today for surgery, with the diagnosis of Rectal Bleeding.  The various methods of treatment have been discussed with the patient and family. After consideration of risks, benefits and other options for treatment, the patient has consented to  Procedure(s) with comments: COLONOSCOPY WITH PROPOFOL (N/A) - 12:45 as a surgical intervention.  The patient's history has been reviewed, patient examined, no change in status, stable for surgery.  I have reviewed the patient's chart and labs.  Questions were answered to the patient's satisfaction.     [name]

## 2021-08-18 NOTE — Discharge Instructions (Addendum)
You are being discharged to home.  Resume your previous diet.  We are waiting for your pathology results.  Your physician has recommended a repeat colonoscopy for surveillance based on pathology results. Will refer to Roseanne Kaufman, NP for hemorrhoidal banding.  Restart coumadin tomorrow

## 2021-08-22 ENCOUNTER — Encounter: Payer: Self-pay | Admitting: Gastroenterology

## 2021-08-22 LAB — SURGICAL PATHOLOGY

## 2021-08-22 NOTE — Progress Notes (Deleted)
Office Visit Note  Patient: Dorothy Ford             Date of Birth: May 28, 1951           MRN: [id number]             PCP: Neale Burly, MD Referring: Neale Burly, MD Visit Date: 09/05/2021 Occupation: @GUAROCC @  Subjective:  No chief complaint on file.   History of Present Illness: Dorothy Ford is a 70 y.o. female ***   Activities of Daily Living:  Patient reports morning stiffness for *** {minute/hour:19697}.   Patient {ACTIONS;DENIES/REPORTS:21021675::"Denies"} nocturnal pain.  Difficulty dressing/grooming: {ACTIONS;DENIES/REPORTS:21021675::"Denies"} Difficulty climbing stairs: {ACTIONS;DENIES/REPORTS:21021675::"Denies"} Difficulty getting out of chair: {ACTIONS;DENIES/REPORTS:21021675::"Denies"} Difficulty using hands for taps, buttons, cutlery, and/or writing: {ACTIONS;DENIES/REPORTS:21021675::"Denies"}  No Rheumatology ROS completed.   PMFS History:  Patient Active Problem List   Diagnosis Date Noted   Ankle joint pain 12/07/2019   Low back pain 12/07/2019   Lumbar radiculopathy 12/07/2019   Pain of left upper arm 12/07/2019   Acute foot pain, left 04/15/2019   Bursitis/tendonitis, shoulder 04/23/2018   Inflammatory arthropathy 02/20/2018   Shoulder pain 02/20/2018   Pain in both knees 11/20/2017   Chondromalacia patellae, left knee 01/15/2017   Chondromalacia patellae, right knee 01/15/2017   Osteoarthritis of both hands 07/24/2016   DDD (degenerative disc disease), cervical 07/24/2016   DDD (degenerative disc disease), lumbar 07/24/2016   Osteoporosis 07/24/2016   Glaucoma 07/24/2016   Sleep apnea 07/24/2016   Pseudogout 07/19/2016   Primary osteoarthritis of both knees 07/19/2016   Fibromyalgia 07/19/2016   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   Pulmonary embolism ([place]) 01/18/2012   Anxiety 01/18/2012   Syncope    RHEUMATOID LUNG 12/30/2009   OBESITY, UNSPECIFIED 06/02/2009   DEPRESSIVE DISORDER NOT ELSEWHERE CLASSIFIED 06/02/2009    SUPRAVENTRICULAR TACHYCARDIA 06/02/2009   Chronic diastolic heart failure ([place]) 06/02/2009   COPD 06/02/2009   DIAPHRAGMAT HERN W/O MENTION OBSTRUCTION/GANGREN 06/02/2009   PALPITATIONS 06/02/2009   CHEST PAIN UNSPECIFIED 06/02/2009    Past Medical History:  Diagnosis Date   Acid reflux    Asthma    Cardiac dysrhythmia, unspecified    Carotid bruit    Bilateral   Cataract    Chest pain, unspecified    Chronic airway obstruction, not elsewhere classified    Chronic sinusitis    Congestive heart failure, unspecified    DDD (degenerative disc disease), cervical 07/24/2016   DDD (degenerative disc disease), lumbar 07/24/2016   Depressive disorder, not elsewhere classified    Diaphragmatic hernia without mention of obstruction or gangrene    Edema    left leg   Fibromyalgia    High cholesterol    History of rheumatoid arthritis    Hypothyroidism    Neuropathy    Obesity, unspecified    Obstructive lung disease (generalized) ([place])    Osteoarthritis of both hands 07/24/2016   Osteopenia    Osteoporosis 07/24/2016   Palpitations    Pseudogout involving multiple joints    Sleep apnea    SVT (supraventricular tachycardia) ([place])    Syncope    admitted 09/2011   Thyroid disease    Unspecified essential hypertension     Family History  Problem Relation Age of Onset   Heart disease Mother    Hypertension Mother    Stroke Other    Heart disease Father    Hypertension Father    Kidney failure Sister    Asthma Sister    Past Surgical History:  Procedure Laterality Date  CARPAL TUNNEL RELEASE Bilateral    CATHETER ABLATION  02/01/1997   @ [facility]   INCONTINENCE SURGERY     removal toenail Bilateral    great toes   VEIN SURGERY BLADDER     Social History   Social History Narrative   Not on file   Immunization History  Administered Date(s) Administered   Moderna Sars-Covid-2 Vaccination 10/08/2019, 11/06/2019     Objective: Vital Signs: There were no vitals  taken for this visit.   Physical Exam   Musculoskeletal Exam: ***  CDAI Exam: CDAI Score: -- Patient Global: --; Provider Global: -- Swollen: --; Tender: -- Joint Exam 09/05/2021   No joint exam has been documented for this visit   There is currently no information documented on the homunculus. Go to the Rheumatology activity and complete the homunculus joint exam.  Investigation: No additional findings.  Imaging: No results found.  Recent Labs: Lab Results  Component Value Date   WBC 7.0 06/26/2018   HGB 12.0 06/26/2018   PLT 317 06/26/2018   NA 141 08/16/2021   K 4.0 08/16/2021   CL 102 08/16/2021   CO2 31 08/16/2021   GLUCOSE 94 08/16/2021   BUN 10 08/16/2021   CREATININE 0.69 08/16/2021   BILITOT 0.9 06/26/2018   AST 18 06/26/2018   ALT 18 06/26/2018   PROT 6.7 06/26/2018   CALCIUM 8.5 (L) 08/16/2021   GFRAA >60 08/12/2018    Speciality Comments: No specialty comments available.  Procedures:  No procedures performed Allergies: Nitroglycerin, Other, Adhesive [tape], Codeine, and Latex   Assessment / Plan:     Visit Diagnoses: No diagnosis found.  Orders: No orders of the defined types were placed in this encounter.  No orders of the defined types were placed in this encounter.   Face-to-face time spent with patient was *** minutes. Greater than 50% of time was spent in counseling and coordination of care.  Follow-Up Instructions: No follow-ups on file.   Earnestine Mealing, CMA  Note - This record has been created using Editor, commissioning.  Chart creation errors have been sought, but may not always  have been located. Such creation errors do not reflect on  the standard of medical care.

## 2021-08-23 ENCOUNTER — Encounter (INDEPENDENT_AMBULATORY_CARE_PROVIDER_SITE_OTHER): Payer: Self-pay | Admitting: *Deleted

## 2021-08-23 ENCOUNTER — Encounter (HOSPITAL_COMMUNITY): Payer: Self-pay | Admitting: Gastroenterology

## 2021-08-25 DIAGNOSIS — Z7901 Long term (current) use of anticoagulants: Secondary | ICD-10-CM | POA: Diagnosis not present

## 2021-08-30 ENCOUNTER — Ambulatory Visit: Payer: Medicare Other | Admitting: Family Medicine

## 2021-08-30 ENCOUNTER — Ambulatory Visit: Payer: Medicare Other | Admitting: Gastroenterology

## 2021-09-02 DIAGNOSIS — I10 Essential (primary) hypertension: Secondary | ICD-10-CM | POA: Diagnosis not present

## 2021-09-02 DIAGNOSIS — U071 COVID-19: Secondary | ICD-10-CM | POA: Diagnosis not present

## 2021-09-02 DIAGNOSIS — R0902 Hypoxemia: Secondary | ICD-10-CM | POA: Diagnosis not present

## 2021-09-02 DIAGNOSIS — R509 Fever, unspecified: Secondary | ICD-10-CM | POA: Diagnosis not present

## 2021-09-05 ENCOUNTER — Ambulatory Visit: Payer: Medicare Other | Admitting: Rheumatology

## 2021-09-06 DIAGNOSIS — M818 Other osteoporosis without current pathological fracture: Secondary | ICD-10-CM | POA: Diagnosis not present

## 2021-09-06 DIAGNOSIS — G4739 Other sleep apnea: Secondary | ICD-10-CM | POA: Diagnosis not present

## 2021-09-06 DIAGNOSIS — E7849 Other hyperlipidemia: Secondary | ICD-10-CM | POA: Diagnosis not present

## 2021-09-06 DIAGNOSIS — Z0000 Encounter for general adult medical examination without abnormal findings: Secondary | ICD-10-CM | POA: Diagnosis not present

## 2021-09-06 DIAGNOSIS — M545 Low back pain, unspecified: Secondary | ICD-10-CM | POA: Diagnosis not present

## 2021-09-06 DIAGNOSIS — I10 Essential (primary) hypertension: Secondary | ICD-10-CM | POA: Diagnosis not present

## 2021-09-06 DIAGNOSIS — E039 Hypothyroidism, unspecified: Secondary | ICD-10-CM | POA: Diagnosis not present

## 2021-09-15 DIAGNOSIS — Z7901 Long term (current) use of anticoagulants: Secondary | ICD-10-CM | POA: Diagnosis not present

## 2021-09-22 ENCOUNTER — Other Ambulatory Visit: Payer: Self-pay

## 2021-09-22 ENCOUNTER — Ambulatory Visit (HOSPITAL_COMMUNITY): Payer: Medicare Other | Attending: Neurosurgery

## 2021-09-22 DIAGNOSIS — M6281 Muscle weakness (generalized): Secondary | ICD-10-CM | POA: Diagnosis not present

## 2021-09-22 DIAGNOSIS — G8929 Other chronic pain: Secondary | ICD-10-CM | POA: Diagnosis not present

## 2021-09-22 DIAGNOSIS — M545 Low back pain, unspecified: Secondary | ICD-10-CM | POA: Insufficient documentation

## 2021-09-22 NOTE — Therapy (Signed)
[place], [place], [address] Phone: [phone]   Fax:  [phone]  Physical Therapy Evaluation  Patient Details  Name: Dorothy Ford MRN: [id number] Date of Birth: 04/05/1951 Referring Provider (PT): Duffy Rhody, MD   Encounter Date: 09/22/2021   PT End of Session - 09/22/21 1046     Visit Number 1    Number of Visits 8    Date for PT Re-Evaluation 10/20/21    Authorization Type UHC Medicare; Medicaid of [place] secondary    PT Start Time 1046   late arrival   PT Stop Time 1115    PT Time Calculation (min) 29 min    Activity Tolerance Patient tolerated treatment well    Behavior During Therapy [facility] for tasks assessed/performed             Past Medical History:  Diagnosis Date   Acid reflux    Asthma    Cardiac dysrhythmia, unspecified    Carotid bruit    Bilateral   Cataract    Chest pain, unspecified    Chronic airway obstruction, not elsewhere classified    Chronic sinusitis    Congestive heart failure, unspecified    DDD (degenerative disc disease), cervical 07/24/2016   DDD (degenerative disc disease), lumbar 07/24/2016   Depressive disorder, not elsewhere classified    Diaphragmatic hernia without mention of obstruction or gangrene    Edema    left leg   Fibromyalgia    High cholesterol    History of rheumatoid arthritis    Hypothyroidism    Neuropathy    Obesity, unspecified    Obstructive lung disease (generalized) ([place])    Osteoarthritis of both hands 07/24/2016   Osteopenia    Osteoporosis 07/24/2016   Palpitations    Pseudogout involving multiple joints    Sleep apnea    SVT (supraventricular tachycardia) ([place])    Syncope    admitted 09/2011   Thyroid disease    Unspecified essential hypertension     Past Surgical History:  Procedure Laterality Date   CARPAL TUNNEL RELEASE Bilateral    CATHETER ABLATION  02/01/1997   @ [facility]   COLONOSCOPY WITH PROPOFOL N/A  08/18/2021   Procedure: COLONOSCOPY WITH PROPOFOL;  Surgeon: Harvel Quale, MD;  [facility];  Service: Gastroenterology;  Laterality: N/A;  12:45   INCONTINENCE SURGERY     POLYPECTOMY  08/18/2021   Procedure: POLYPECTOMY;  Surgeon: Harvel Quale, MD;  [facility];  Service: Gastroenterology;;   removal toenail Bilateral    great toes   VEIN SURGERY BLADDER      There were no vitals filed for this visit.    Subjective Assessment - 09/22/21 1049     Subjective Increasing back pain over the past few months with history of chronic back pain. Pt notes no therapy for her back but has had PT for other orthopedic ailments. Pt has had little intervention for her lumbar spine at this point    Limitations Standing;Walking;Lifting;House hold activities    How long can you sit comfortably? better when sitting    How long can you stand comfortably? 10 min    Diagnostic tests MRI    Patient Stated Goals "have less pain when bending over"    Currently in Pain? Yes    Pain Score 10-Worst pain ever   when bending over to clean bathtub   Pain Location Back    Pain Orientation Lower;Left  Pain Type Chronic pain    Pain Radiating Towards bilateral posterior legs    Pain Onset More than a month ago    Pain Frequency Constant    Aggravating Factors  bending over, standing prolonged time, unable to stand for cooking, has to sit on stool for washing dishes    Pain Relieving Factors sitting vs standing                [facility] PT Assessment - 09/22/21 0001       Assessment   Medical Diagnosis lumbar facet arthropathy    Referring Provider (PT) Duffy Rhody, MD      Balance Screen   Has the patient fallen in the past 6 months No    Has the patient had a decrease in activity level because of a fear of falling?  No    Is the patient reluctant to leave their home because of a fear of falling?  No      Home Environment   Living Environment  Private residence    Living Arrangements Other relatives    Available Help at Discharge Family    Type of Home House      Prior Function   Level of Independence Independent with basic ADLs;Independent with household mobility without device;Independent with community mobility with device    Vocation Retired      Observation/Other Assessments   Focus on Therapeutic Outcomes (FOTO)  43.54% function      Posture/Postural Control   Posture/Postural Control No significant limitations      ROM / Strength   AROM / PROM / Strength AROM;Strength      AROM   AROM Assessment Site Lumbar    Lumbar Flexion 10% limited    Lumbar Extension 25% limited    Lumbar - Right Side Bend WFL    Lumbar - Left Side Bend [facility]      Strength   Strength Assessment Site Lumbar    Lumbar Flexion 3/5      Flexibility   Soft Tissue Assessment /Muscle Length yes    Hamstrings 65 degrees hip flexion limited by hamstring      Transfers   Transfers Sit to Stand    Sit to Stand 7: Independent      Ambulation/Gait   Ambulation/Gait Yes    Ambulation/Gait Assistance 7: Independent                        Objective measurements completed on examination: See above findings.       [place] Adult PT Treatment/Exercise - 09/22/21 0001       Exercises   Exercises Lumbar      Lumbar Exercises: Stretches   Lower Trunk Rotation 1 rep;60 seconds                     PT Education - 09/22/21 1129     Education Details education on rational of PT intervention    Person(s) Educated Patient    Methods Explanation    Comprehension Verbalized understanding              PT Short Term Goals - 09/22/21 1134       PT SHORT TERM GOAL #1   Title Patient will be independent with HEP in order to improve functional outcomes.    Time 2    Period Weeks    Status New    Target Date 10/06/21      PT  SHORT TERM GOAL #2   Title Patient will report at least 25% improvement in symptoms for  improved quality of life.    Baseline 10/10 back pain when bending over    Time 2    Period Weeks    Status New    Target Date 10/06/21      PT SHORT TERM GOAL #3   Title Demo proper squat mechanics with verbal cues to improve body mechanics and reduce back pain/strain w/ ADL/housekeeping    Baseline unable, forward flexes    Time 2    Period Weeks    Status New    Target Date 10/06/21               PT Long Term Goals - 09/22/21 1136       PT LONG TERM GOAL #1   Title Patient will improve FOTO score by at least 5 points in order to indicate improved tolerance to activity.    Baseline 43.54% function    Time 4    Period Weeks    Status New    Target Date 10/20/21      PT LONG TERM GOAL #2   Title Demo good body mechanics with squat lifting with indepenendence, no verbal/tactile cues needed.    Baseline unable    Time 4    Period Weeks    Status New    Target Date 10/20/21      PT LONG TERM GOAL #3   Title Demo 4/5 trunk flexion strength to improve lumbar stabilization    Baseline 3/5    Time 4    Period Weeks    Status New    Target Date 10/20/21                    Plan - 09/22/21 1130     Clinical Impression Statement Patient is a  71 yo lady presenting to physical therapy with c/o chronic LBP. She presents with pain limited deficits in lumbar strength, ROM, endurance, postural impairments, spinal mobility and functional mobility with ADL. She is having to modify and restrict ADL as indicated by FOTO score as well as subjective information and objective measures which is affecting overall participation. Patient will benefit from skilled physical therapy in order to improve function and reduce impairment.    Personal Factors and Comorbidities Age;Comorbidity 3+;Time since onset of injury/illness/exacerbation    Comorbidities respiratory, cardiac, multipe orthopedic ailments    Examination-Activity Limitations Bed  Mobility;Bend;Carry;Lift;Stand;Stairs;Squat;Locomotion Level    Examination-Participation Restrictions Cleaning;Community Activity;Meal Prep    Stability/Clinical Decision Making Evolving/Moderate complexity    Clinical Decision Making Moderate    Rehab Potential Fair    PT Frequency 2x / week    PT Duration 4 weeks    PT Treatment/Interventions ADLs/Self Care Home Management;Electrical Stimulation;DME Instruction;Traction;Gait training;Stair training;Functional mobility training;Therapeutic activities;Therapeutic exercise;Balance training;Patient/family education;Neuromuscular re-education;Manual techniques;Passive range of motion;Taping;Dry needling;Spinal Manipulations;Joint Manipulations    PT Next Visit Plan SKTC, DKTC (if shoulder and knee allow), hamstring stretching, body mechanics    PT Home Exercise Plan LTR    Consulted and Agree with Plan of Care Patient             Patient will benefit from skilled therapeutic intervention in order to improve the following deficits and impairments:  Decreased activity tolerance, Decreased mobility, Decreased endurance, Decreased range of motion, Decreased strength, Difficulty walking, Hypomobility, Impaired flexibility, Improper body mechanics, Pain, Obesity  Visit Diagnosis: Chronic low back pain, unspecified back pain laterality, unspecified  whether sciatica present  Muscle weakness (generalized)     Problem List Patient Active Problem List   Diagnosis Date Noted   Ankle joint pain 12/07/2019   Low back pain 12/07/2019   Lumbar radiculopathy 12/07/2019   Pain of left upper arm 12/07/2019   Acute foot pain, left 04/15/2019   Bursitis/tendonitis, shoulder 04/23/2018   Inflammatory arthropathy 02/20/2018   Shoulder pain 02/20/2018   Pain in both knees 11/20/2017   Chondromalacia patellae, left knee 01/15/2017   Chondromalacia patellae, right knee 01/15/2017   Osteoarthritis of both hands 07/24/2016   DDD (degenerative disc  disease), cervical 07/24/2016   DDD (degenerative disc disease), lumbar 07/24/2016   Osteoporosis 07/24/2016   Glaucoma 07/24/2016   Sleep apnea 07/24/2016   Pseudogout 07/19/2016   Primary osteoarthritis of both knees 07/19/2016   Fibromyalgia 07/19/2016   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   Pulmonary embolism ([place]) 01/18/2012   Anxiety 01/18/2012   Syncope    RHEUMATOID LUNG 12/30/2009   OBESITY, UNSPECIFIED 06/02/2009   DEPRESSIVE DISORDER NOT ELSEWHERE CLASSIFIED 06/02/2009   SUPRAVENTRICULAR TACHYCARDIA 06/02/2009   Chronic diastolic heart failure ([place]) 06/02/2009   COPD 06/02/2009   DIAPHRAGMAT HERN W/O MENTION OBSTRUCTION/GANGREN 06/02/2009   PALPITATIONS 06/02/2009   CHEST PAIN UNSPECIFIED 06/02/2009    Toniann Fail, PT 09/22/2021, 11:44 AM  [facility] [address] [place], [place], [address] Phone: [phone]   Fax:  [phone]  Name: Dorothy Ford MRN: [id number] Date of Birth: May 04, 1951

## 2021-09-28 ENCOUNTER — Other Ambulatory Visit: Payer: Self-pay

## 2021-09-28 ENCOUNTER — Ambulatory Visit (HOSPITAL_COMMUNITY): Payer: Medicare Other | Admitting: Physical Therapy

## 2021-09-28 DIAGNOSIS — M545 Low back pain, unspecified: Secondary | ICD-10-CM | POA: Diagnosis not present

## 2021-09-28 DIAGNOSIS — M6281 Muscle weakness (generalized): Secondary | ICD-10-CM

## 2021-09-28 DIAGNOSIS — G8929 Other chronic pain: Secondary | ICD-10-CM | POA: Diagnosis not present

## 2021-09-28 NOTE — Therapy (Signed)
[place], [place], [address] Phone: [phone]   Fax:  [phone]  Physical Therapy Treatment  Patient Details  Name: Dorothy Ford MRN: [id number] Date of Birth: Sep 11, 1950 Referring Provider (PT): Duffy Rhody, MD   Encounter Date: 09/28/2021   PT End of Session - 09/28/21 1220     Visit Number 2    Number of Visits 8    Date for PT Re-Evaluation 10/20/21    Authorization Type UHC Medicare; Medicaid of [place] secondary    PT Start Time 1135    PT Stop Time 1220    PT Time Calculation (min) 45 min    Activity Tolerance Patient tolerated treatment well    Behavior During Therapy [facility] for tasks assessed/performed             Past Medical History:  Diagnosis Date   Acid reflux    Asthma    Cardiac dysrhythmia, unspecified    Carotid bruit    Bilateral   Cataract    Chest pain, unspecified    Chronic airway obstruction, not elsewhere classified    Chronic sinusitis    Congestive heart failure, unspecified    DDD (degenerative disc disease), cervical 07/24/2016   DDD (degenerative disc disease), lumbar 07/24/2016   Depressive disorder, not elsewhere classified    Diaphragmatic hernia without mention of obstruction or gangrene    Edema    left leg   Fibromyalgia    High cholesterol    History of rheumatoid arthritis    Hypothyroidism    Neuropathy    Obesity, unspecified    Obstructive lung disease (generalized) ([place])    Osteoarthritis of both hands 07/24/2016   Osteopenia    Osteoporosis 07/24/2016   Palpitations    Pseudogout involving multiple joints    Sleep apnea    SVT (supraventricular tachycardia) ([place])    Syncope    admitted 09/2011   Thyroid disease    Unspecified essential hypertension     Past Surgical History:  Procedure Laterality Date   CARPAL TUNNEL RELEASE Bilateral    CATHETER ABLATION  02/01/1997   @ [facility]   COLONOSCOPY WITH PROPOFOL N/A 08/18/2021    Procedure: COLONOSCOPY WITH PROPOFOL;  Surgeon: Harvel Quale, MD;  [facility];  Service: Gastroenterology;  Laterality: N/A;  12:45   INCONTINENCE SURGERY     POLYPECTOMY  08/18/2021   Procedure: POLYPECTOMY;  Surgeon: Harvel Quale, MD;  [facility];  Service: Gastroenterology;;   removal toenail Bilateral    great toes   VEIN SURGERY BLADDER      There were no vitals filed for this visit.   Subjective Assessment - 09/28/21 1146     Subjective pt states she always has 10/10 pain from arthritis from head to toe.  Denies need to go to ED with 10/10 pain.    Currently in Pain? Yes    Pain Score 10-Worst pain ever    Pain Location Generalized                               [facility] Adult PT Treatment/Exercise - 09/28/21 0001       Lumbar Exercises: Stretches   Lower Trunk Rotation 5 reps;10 seconds    Other Lumbar Stretch Exercise knee to chest stretch 3X20" each      Lumbar Exercises: Supine   Ab Set 10 reps  Heel Slides 10 reps    Bridge 10 reps    Straight Leg Raise 10 reps      Lumbar Exercises: Sidelying   Clam Both;10 reps                     PT Education - 09/28/21 1218     Education Details reveiwed goals and POC moving forward    Person(s) Educated Patient    Methods Explanation    Comprehension Verbalized understanding              PT Short Term Goals - 09/28/21 1218       PT SHORT TERM GOAL #1   Title Patient will be independent with HEP in order to improve functional outcomes.    Time 2    Period Weeks    Status On-going    Target Date 10/06/21      PT SHORT TERM GOAL #2   Title Patient will report at least 25% improvement in symptoms for improved quality of life.    Baseline 10/10 back pain when bending over    Time 2    Period Weeks    Status New    Target Date 10/06/21      PT SHORT TERM GOAL #3   Title Demo proper squat mechanics with verbal cues to improve  body mechanics and reduce back pain/strain w/ ADL/housekeeping    Baseline unable, forward flexes    Time 2    Period Weeks    Status On-going    Target Date 10/06/21               PT Long Term Goals - 09/28/21 1218       PT LONG TERM GOAL #1   Title Patient will improve FOTO score by at least 5 points in order to indicate improved tolerance to activity.    Baseline 43.54% function    Time 4    Period Weeks    Status On-going    Target Date 10/20/21      PT LONG TERM GOAL #2   Title Demo good body mechanics with squat lifting with indepenendence, no verbal/tactile cues needed.    Baseline unable    Time 4    Period Weeks    Status On-going    Target Date 10/20/21      PT LONG TERM GOAL #3   Title Demo 4/5 trunk flexion strength to improve lumbar stabilization    Baseline 3/5    Time 4    Period Weeks    Status On-going    Target Date 10/20/21                   Plan - 09/28/21 1221     Clinical Impression Statement Reviewed goals, HEP and POC moving froward.  Progress stabiliation, ROM and LE strengthening.  Pt with cues for form and hold times.  Good stabilzation noted with sidelying clams.  Pt without any complaints during or at conclusion of session today.  Pt will continue to benefit from skilled therapy.    Personal Factors and Comorbidities Age;Comorbidity 3+;Time since onset of injury/illness/exacerbation    Comorbidities respiratory, cardiac, multipe orthopedic ailments    Examination-Activity Limitations Bed Mobility;Bend;Carry;Lift;Stand;Stairs;Squat;Locomotion Level    Examination-Participation Restrictions Cleaning;Community Activity;Meal Prep    Stability/Clinical Decision Making Evolving/Moderate complexity    Rehab Potential Fair    PT Frequency 2x / week    PT Duration 4 weeks    PT  Treatment/Interventions ADLs/Self Care Home Management;Electrical Stimulation;DME Instruction;Traction;Gait training;Stair training;Functional mobility  training;Therapeutic activities;Therapeutic exercise;Balance training;Patient/family education;Neuromuscular re-education;Manual techniques;Passive range of motion;Taping;Dry needling;Spinal Manipulations;Joint Manipulations    PT Next Visit Plan continue with POC; begin hamstring stretching and progress to body mechanics    PT Home Exercise Plan LTR    Consulted and Agree with Plan of Care Patient             Patient will benefit from skilled therapeutic intervention in order to improve the following deficits and impairments:  Decreased activity tolerance, Decreased mobility, Decreased endurance, Decreased range of motion, Decreased strength, Difficulty walking, Hypomobility, Impaired flexibility, Improper body mechanics, Pain, Obesity  Visit Diagnosis: Chronic low back pain, unspecified back pain laterality, unspecified whether sciatica present  Muscle weakness (generalized)     Problem List Patient Active Problem List   Diagnosis Date Noted   Ankle joint pain 12/07/2019   Low back pain 12/07/2019   Lumbar radiculopathy 12/07/2019   Pain of left upper arm 12/07/2019   Acute foot pain, left 04/15/2019   Bursitis/tendonitis, shoulder 04/23/2018   Inflammatory arthropathy 02/20/2018   Shoulder pain 02/20/2018   Pain in both knees 11/20/2017   Chondromalacia patellae, left knee 01/15/2017   Chondromalacia patellae, right knee 01/15/2017   Osteoarthritis of both hands 07/24/2016   DDD (degenerative disc disease), cervical 07/24/2016   DDD (degenerative disc disease), lumbar 07/24/2016   Osteoporosis 07/24/2016   Glaucoma 07/24/2016   Sleep apnea 07/24/2016   Pseudogout 07/19/2016   Primary osteoarthritis of both knees 07/19/2016   Fibromyalgia 07/19/2016   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   Pulmonary embolism ([place]) 01/18/2012   Anxiety 01/18/2012   Syncope    RHEUMATOID LUNG 12/30/2009   OBESITY, UNSPECIFIED 06/02/2009   DEPRESSIVE DISORDER NOT ELSEWHERE CLASSIFIED  06/02/2009   SUPRAVENTRICULAR TACHYCARDIA 06/02/2009   Chronic diastolic heart failure ([place]) 06/02/2009   COPD 06/02/2009   DIAPHRAGMAT HERN W/O MENTION OBSTRUCTION/GANGREN 06/02/2009   PALPITATIONS 06/02/2009   CHEST PAIN UNSPECIFIED 06/02/2009   Teena Irani, PTA/CLT, WTA [phone]  Teena Irani, PTA 09/28/2021, 12:23 PM  [facility] [address] [place], [place], [address] Phone: [phone]   Fax:  [phone]  Name: TOKIKO DIEFENDERFER MRN: [id number] Date of Birth: 23-Aug-1951

## 2021-10-02 DIAGNOSIS — M47816 Spondylosis without myelopathy or radiculopathy, lumbar region: Secondary | ICD-10-CM | POA: Diagnosis not present

## 2021-10-03 DIAGNOSIS — I10 Essential (primary) hypertension: Secondary | ICD-10-CM | POA: Diagnosis not present

## 2021-10-03 DIAGNOSIS — R509 Fever, unspecified: Secondary | ICD-10-CM | POA: Diagnosis not present

## 2021-10-03 DIAGNOSIS — R0902 Hypoxemia: Secondary | ICD-10-CM | POA: Diagnosis not present

## 2021-10-03 DIAGNOSIS — U071 COVID-19: Secondary | ICD-10-CM | POA: Diagnosis not present

## 2021-10-05 ENCOUNTER — Other Ambulatory Visit: Payer: Self-pay

## 2021-10-05 ENCOUNTER — Encounter (HOSPITAL_COMMUNITY): Payer: Self-pay

## 2021-10-05 ENCOUNTER — Ambulatory Visit (HOSPITAL_COMMUNITY): Payer: Medicare Other | Attending: Neurosurgery

## 2021-10-05 DIAGNOSIS — M545 Low back pain, unspecified: Secondary | ICD-10-CM | POA: Diagnosis not present

## 2021-10-05 DIAGNOSIS — M6281 Muscle weakness (generalized): Secondary | ICD-10-CM

## 2021-10-05 DIAGNOSIS — G8929 Other chronic pain: Secondary | ICD-10-CM

## 2021-10-05 NOTE — Patient Instructions (Signed)
Access Code: [email or web address] URL: [email or web address] Date: 10/05/2021 Prepared by: Jaya Lapka Hartnett-Rands  Exercises Hooklying Single Knee to Chest Stretch - 1 x daily - 7 x weekly - 1 sets - 3-5 reps - 20-30 hold Supine Transversus Abdominis Bracing - Hands on Ground - 1 x daily - 7 x weekly - 1 sets - 10 reps

## 2021-10-05 NOTE — Therapy (Addendum)
[place], [place], [address] Phone: [phone]   Fax:  [phone]  Physical Therapy Treatment  Patient Details  Name: Dorothy Ford MRN: [id number] Date of Birth: Oct 25, 1950 Referring Provider (PT): Duffy Rhody, MD   Encounter Date: 10/05/2021   PT End of Session - 10/05/21 0915     Visit Number 3    Number of Visits 8    Date for PT Re-Evaluation 10/20/21    Authorization Type UHC Medicare; Medicaid of [place] secondary    PT Start Time 0902   Patient 14 minutes late to appointment.   PT Stop Time 0930    PT Time Calculation (min) 28 min    Activity Tolerance Patient tolerated treatment well    Behavior During Therapy WFL for tasks assessed/performed             Past Medical History:  Diagnosis Date   Acid reflux    Asthma    Cardiac dysrhythmia, unspecified    Carotid bruit    Bilateral   Cataract    Chest pain, unspecified    Chronic airway obstruction, not elsewhere classified    Chronic sinusitis    Congestive heart failure, unspecified    DDD (degenerative disc disease), cervical 07/24/2016   DDD (degenerative disc disease), lumbar 07/24/2016   Depressive disorder, not elsewhere classified    Diaphragmatic hernia without mention of obstruction or gangrene    Edema    left leg   Fibromyalgia    High cholesterol    History of rheumatoid arthritis    Hypothyroidism    Neuropathy    Obesity, unspecified    Obstructive lung disease (generalized) ([place])    Osteoarthritis of both hands 07/24/2016   Osteopenia    Osteoporosis 07/24/2016   Palpitations    Pseudogout involving multiple joints    Sleep apnea    SVT (supraventricular tachycardia) ([place])    Syncope    admitted 09/2011   Thyroid disease    Unspecified essential hypertension     Past Surgical History:  Procedure Laterality Date   CARPAL TUNNEL RELEASE Bilateral    CATHETER ABLATION  02/01/1997   @ [facility]    COLONOSCOPY WITH PROPOFOL N/A 08/18/2021   Procedure: COLONOSCOPY WITH PROPOFOL;  Surgeon: Harvel Quale, MD;  [facility];  Service: Gastroenterology;  Laterality: N/A;  12:45   INCONTINENCE SURGERY     POLYPECTOMY  08/18/2021   Procedure: POLYPECTOMY;  Surgeon: Harvel Quale, MD;  Location: [facility];  Service: Gastroenterology;;   removal toenail Bilateral    great toes   VEIN SURGERY BLADDER      There were no vitals filed for this visit.   Subjective Assessment - 10/05/21 0913     Subjective Got an injection in her back Monday. Hasn't seen a difference in her pain so far. Going back in March for another injection.    Currently in Pain? Yes    Pain Score 9     Pain Location Back    Aggravating Factors  sweeping, standing to wash dishes, cleaning bath tub                [facility] Adult PT Treatment/Exercise - 10/05/21 0001       Lumbar Exercises: Stretches   Lower Trunk Rotation 5 reps;10 seconds    Other Lumbar Stretch Exercise knee to chest stretch 3X20" each      Lumbar Exercises: Supine   Ab Set  10 reps    Other Supine Lumbar Exercises decompresssion exercises                 PT Education - 10/05/21 0300     Education Details Advanced HEP. Discussed purpose and technique of skilled interventions throughout session.    Person(s) Educated Patient    Methods Explanation;Handout    Comprehension Verbalized understanding              PT Short Term Goals - 10/05/21 0919       PT SHORT TERM GOAL #1   Title Patient will be independent with HEP in order to improve functional outcomes.    Time 2    Period Weeks    Status On-going    Target Date 10/06/21      PT SHORT TERM GOAL #2   Title Patient will report at least 25% improvement in symptoms for improved quality of life.    Baseline 10/10 back pain when bending over    Time 2    Period Weeks    Status New    Target Date 10/06/21      PT SHORT TERM GOAL #3    Title Demo proper squat mechanics with verbal cues to improve body mechanics and reduce back pain/strain w/ ADL/housekeeping    Baseline unable, forward flexes    Time 2    Period Weeks    Status On-going    Target Date 10/06/21               PT Long Term Goals - 10/05/21 0919       PT LONG TERM GOAL #1   Title Patient will improve FOTO score by at least 5 points in order to indicate improved tolerance to activity.    Baseline 43.54% function    Time 4    Period Weeks    Status On-going    Target Date 10/20/21      PT LONG TERM GOAL #2   Title Demo good body mechanics with squat lifting with indepenendence, no verbal/tactile cues needed.    Baseline unable    Time 4    Period Weeks    Status On-going    Target Date 10/20/21      PT LONG TERM GOAL #3   Title Demo 4/5 trunk flexion strength to improve lumbar stabilization    Baseline 3/5    Time 4    Period Weeks    Status On-going    Target Date 10/20/21                   Plan - 10/05/21 0916     Clinical Impression Statement Session focused on stretching, strengthening of core/trunk and advancement of HEP to carry over. Added decompression exercises 1-5, single knee to chest and transversus abdominus exercises to HEP. Patient required cues to switch to next hold repetition. Patient would continue to benefit from skilled physical therapy to reduce impairment and improve function.    Personal Factors and Comorbidities Age;Comorbidity 3+;Time since onset of injury/illness/exacerbation    Comorbidities respiratory, cardiac, multipe orthopedic ailments    Examination-Activity Limitations Bed Mobility;Bend;Carry;Lift;Stand;Stairs;Squat;Locomotion Level    Examination-Participation Restrictions Cleaning;Community Activity;Meal Prep    Stability/Clinical Decision Making Evolving/Moderate complexity    Rehab Potential Fair    PT Frequency 2x / week    PT Duration 4 weeks    PT Treatment/Interventions ADLs/Self  Care Home Management;Electrical Stimulation;DME Instruction;Traction;Gait training;Stair training;Functional mobility training;Therapeutic activities;Therapeutic exercise;Balance training;Patient/family education;Neuromuscular re-education;Manual techniques;Passive  range of motion;Taping;Dry needling;Spinal Manipulations;Joint Manipulations    PT Next Visit Plan continue with POC; begin hamstring stretching and progress to body mechanics    PT Home Exercise Plan LTR; 2/2 - decomplression exercises 1-5    Consulted and Agree with Plan of Care Patient             Patient will benefit from skilled therapeutic intervention in order to improve the following deficits and impairments:  Decreased activity tolerance, Decreased mobility, Decreased endurance, Decreased range of motion, Decreased strength, Difficulty walking, Hypomobility, Impaired flexibility, Improper body mechanics, Pain, Obesity  Visit Diagnosis: Chronic low back pain, unspecified back pain laterality, unspecified whether sciatica present  Muscle weakness (generalized)     Problem List Patient Active Problem List   Diagnosis Date Noted   Ankle joint pain 12/07/2019   Low back pain 12/07/2019   Lumbar radiculopathy 12/07/2019   Pain of left upper arm 12/07/2019   Acute foot pain, left 04/15/2019   Bursitis/tendonitis, shoulder 04/23/2018   Inflammatory arthropathy 02/20/2018   Shoulder pain 02/20/2018   Pain in both knees 11/20/2017   Chondromalacia patellae, left knee 01/15/2017   Chondromalacia patellae, right knee 01/15/2017   Osteoarthritis of both hands 07/24/2016   DDD (degenerative disc disease), cervical 07/24/2016   DDD (degenerative disc disease), lumbar 07/24/2016   Osteoporosis 07/24/2016   Glaucoma 07/24/2016   Sleep apnea 07/24/2016   Pseudogout 07/19/2016   Primary osteoarthritis of both knees 07/19/2016   Fibromyalgia 07/19/2016   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   Pulmonary embolism ([place]) 01/18/2012    Anxiety 01/18/2012   Syncope    RHEUMATOID LUNG 12/30/2009   OBESITY, UNSPECIFIED 06/02/2009   DEPRESSIVE DISORDER NOT ELSEWHERE CLASSIFIED 06/02/2009   SUPRAVENTRICULAR TACHYCARDIA 06/02/2009   Chronic diastolic heart failure ([place]) 06/02/2009   COPD 06/02/2009   DIAPHRAGMAT HERN W/O MENTION OBSTRUCTION/GANGREN 06/02/2009   PALPITATIONS 06/02/2009   CHEST PAIN UNSPECIFIED 06/02/2009   Pamala Hurry D. Hartnett-Rands, MS, PT Per Dawson [phone]  Jeannie Done, PT 10/05/2021, 9:31 AM  [facility] [address] [place], [place], [address] Phone: [phone]   Fax:  [phone]  Name: ERNESTINE ROHMAN MRN: [id number] Date of Birth: 1951-06-19

## 2021-10-06 ENCOUNTER — Ambulatory Visit (HOSPITAL_COMMUNITY): Payer: Medicare Other | Admitting: Physical Therapy

## 2021-10-06 DIAGNOSIS — M545 Low back pain, unspecified: Secondary | ICD-10-CM | POA: Diagnosis not present

## 2021-10-06 DIAGNOSIS — H40013 Open angle with borderline findings, low risk, bilateral: Secondary | ICD-10-CM | POA: Diagnosis not present

## 2021-10-06 DIAGNOSIS — G8929 Other chronic pain: Secondary | ICD-10-CM | POA: Diagnosis not present

## 2021-10-06 DIAGNOSIS — M6281 Muscle weakness (generalized): Secondary | ICD-10-CM | POA: Diagnosis not present

## 2021-10-06 DIAGNOSIS — H25813 Combined forms of age-related cataract, bilateral: Secondary | ICD-10-CM | POA: Diagnosis not present

## 2021-10-06 NOTE — Therapy (Signed)
[place], [place], [address] Phone: [phone]   Fax:  [phone]  Physical Therapy Treatment  Patient Details  Name: Dorothy Ford MRN: [id number] Date of Birth: [birth year] Referring Provider (PT): Duffy Rhody, MD   Encounter Date: 10/06/2021   PT End of Session - 10/06/21 1421     Visit Number 4    Number of Visits 8    Date for PT Re-Evaluation 10/20/21    Authorization Type UHC Medicare; Medicaid of [place] secondary    PT Start Time 1400    PT Stop Time 1440    PT Time Calculation (min) 40 min    Activity Tolerance Patient tolerated treatment well    Behavior During Therapy [facility] for tasks assessed/performed             Past Medical History:  Diagnosis Date   Acid reflux    Asthma    Cardiac dysrhythmia, unspecified    Carotid bruit    Bilateral   Cataract    Chest pain, unspecified    Chronic airway obstruction, not elsewhere classified    Chronic sinusitis    Congestive heart failure, unspecified    DDD (degenerative disc disease), cervical 07/24/2016   DDD (degenerative disc disease), lumbar 07/24/2016   Depressive disorder, not elsewhere classified    Diaphragmatic hernia without mention of obstruction or gangrene    Edema    left leg   Fibromyalgia    High cholesterol    History of rheumatoid arthritis    Hypothyroidism    Neuropathy    Obesity, unspecified    Obstructive lung disease (generalized) ([place])    Osteoarthritis of both hands 07/24/2016   Osteopenia    Osteoporosis 07/24/2016   Palpitations    Pseudogout involving multiple joints    Sleep apnea    SVT (supraventricular tachycardia) ([place])    Syncope    admitted 09/2011   Thyroid disease    Unspecified essential hypertension     Past Surgical History:  Procedure Laterality Date   CARPAL TUNNEL RELEASE Bilateral    CATHETER ABLATION  02/01/1997   @ [facility]   COLONOSCOPY WITH PROPOFOL N/A 08/18/2021    Procedure: COLONOSCOPY WITH PROPOFOL;  Surgeon: Harvel Quale, MD;  [facility];  Service: Gastroenterology;  Laterality: N/A;  12:45   INCONTINENCE SURGERY     POLYPECTOMY  08/18/2021   Procedure: POLYPECTOMY;  Surgeon: Harvel Quale, MD;  [facility];  Service: Gastroenterology;;   removal toenail Bilateral    great toes   VEIN SURGERY BLADDER      There were no vitals filed for this visit.   Subjective Assessment - 10/06/21 1411     Subjective Pt states that sweeping, making her bed and cleaning the tub really hurts her back. States if she drops something on the floor she has to sit down on a chair and pick it up as she can not reach the floor.    Limitations Standing;Walking;Lifting;House hold activities    How long can you sit comfortably? better when sitting    How long can you stand comfortably? 10 min    Diagnostic tests MRI    Patient Stated Goals "have less pain when bending over"    Pain Onset More than a month ago  [facility] Adult PT Treatment/Exercise - 10/06/21 0001       Exercises   Exercises Lumbar      Lumbar Exercises: Stretches   Active Hamstring Stretch Left;Right;3 reps;30 seconds    Single Knee to Chest Stretch 3 reps;30 seconds    Lower Trunk Rotation 5 reps;10 seconds      Lumbar Exercises: Standing   Heel Raises 10 reps    Functional Squats 10 reps      Lumbar Exercises: Seated   Sit to Stand 10 reps    Other Seated Lumbar Exercises ab set x 10      Lumbar Exercises: Supine   Clam 10 reps    Bridge 15 reps                     PT Education - 10/06/21 1422     Education Details How to use functional squat to get items out of the dryer, lower cabinets.  Proper body mechanics for sit to sidelying to supine.    Person(s) Educated Patient    Methods Explanation    Comprehension Verbalized understanding              PT Short Term Goals  - 10/05/21 0919       PT SHORT TERM GOAL #1   Title Patient will be independent with HEP in order to improve functional outcomes.    Time 2    Period Weeks    Status On-going    Target Date 10/06/21      PT SHORT TERM GOAL #2   Title Patient will report at least 25% improvement in symptoms for improved quality of life.    Baseline 10/10 back pain when bending over    Time 2    Period Weeks    Status New    Target Date 10/06/21      PT SHORT TERM GOAL #3   Title Demo proper squat mechanics with verbal cues to improve body mechanics and reduce back pain/strain w/ ADL/housekeeping    Baseline unable, forward flexes    Time 2    Period Weeks    Status On-going    Target Date 10/06/21               PT Long Term Goals - 10/05/21 0919       PT LONG TERM GOAL #1   Title Patient will improve FOTO score by at least 5 points in order to indicate improved tolerance to activity.    Baseline 43.54% function    Time 4    Period Weeks    Status On-going    Target Date 10/20/21      PT LONG TERM GOAL #2   Title Demo good body mechanics with squat lifting with indepenendence, no verbal/tactile cues needed.    Baseline unable    Time 4    Period Weeks    Status On-going    Target Date 10/20/21      PT LONG TERM GOAL #3   Title Demo 4/5 trunk flexion strength to improve lumbar stabilization    Baseline 3/5    Time 4    Period Weeks    Status On-going    Target Date 10/20/21                   Plan - 10/06/21 1423     Clinical Impression Statement Progressed pt to standing stabilization for improved function.  Updated HEP.  Pt still has  significant weakness in core mm and needs continued education on proper body mechanics.    Personal Factors and Comorbidities Age;Comorbidity 3+;Time since onset of injury/illness/exacerbation    Comorbidities respiratory, cardiac, multipe orthopedic ailments    Examination-Activity Limitations Bed  Mobility;Bend;Carry;Lift;Stand;Stairs;Squat;Locomotion Level    Examination-Participation Restrictions Cleaning;Community Activity;Meal Prep    Stability/Clinical Decision Making Evolving/Moderate complexity    Rehab Potential Fair    PT Frequency 2x / week    PT Duration 4 weeks    PT Treatment/Interventions ADLs/Self Care Home Management;Electrical Stimulation;DME Instruction;Traction;Gait training;Stair training;Functional mobility training;Therapeutic activities;Therapeutic exercise;Balance training;Patient/family education;Neuromuscular re-education;Manual techniques;Passive range of motion;Taping;Dry needling;Spinal Manipulations;Joint Manipulations    PT Next Visit Plan continue with POC focusing on core strengthening    PT Home Exercise Plan LTR; 2/2 - decomplression exercises 1-5; 2/3: abdominal set seated, heel raises, functional squat, sit to stand, knee to chest    Consulted and Agree with Plan of Care Patient             Patient will benefit from skilled therapeutic intervention in order to improve the following deficits and impairments:  Decreased activity tolerance, Decreased mobility, Decreased endurance, Decreased range of motion, Decreased strength, Difficulty walking, Hypomobility, Impaired flexibility, Improper body mechanics, Pain, Obesity  Visit Diagnosis: Chronic low back pain, unspecified back pain laterality, unspecified whether sciatica present  Muscle weakness (generalized)     Problem List Patient Active Problem List   Diagnosis Date Noted   Ankle joint pain 12/07/2019   Low back pain 12/07/2019   Lumbar radiculopathy 12/07/2019   Pain of left upper arm 12/07/2019   Acute foot pain, left 04/15/2019   Bursitis/tendonitis, shoulder 04/23/2018   Inflammatory arthropathy 02/20/2018   Shoulder pain 02/20/2018   Pain in both knees 11/20/2017   Chondromalacia patellae, left knee 01/15/2017   Chondromalacia patellae, right knee 01/15/2017   Osteoarthritis  of both hands 07/24/2016   DDD (degenerative disc disease), cervical 07/24/2016   DDD (degenerative disc disease), lumbar 07/24/2016   Osteoporosis 07/24/2016   Glaucoma 07/24/2016   Sleep apnea 07/24/2016   Pseudogout 07/19/2016   Primary osteoarthritis of both knees 07/19/2016   Fibromyalgia 07/19/2016   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   Pulmonary embolism ([place]) 01/18/2012   Anxiety 01/18/2012   Syncope    RHEUMATOID LUNG 12/30/2009   OBESITY, UNSPECIFIED 06/02/2009   DEPRESSIVE DISORDER NOT ELSEWHERE CLASSIFIED 06/02/2009   SUPRAVENTRICULAR TACHYCARDIA 06/02/2009   Chronic diastolic heart failure ([place]) 06/02/2009   COPD 06/02/2009   DIAPHRAGMAT HERN W/O MENTION OBSTRUCTION/GANGREN 06/02/2009   PALPITATIONS 06/02/2009   CHEST PAIN UNSPECIFIED 06/02/2009   Rayetta Humphrey, PT CLT [phone]  10/06/2021, 2:43 PM  [facility] [address] [place], [place], [address] Phone: [phone]   Fax:  [phone]  Name: Dorothy Ford MRN: [id number] Date of Birth: 09-Nov-1950

## 2021-10-12 ENCOUNTER — Encounter (HOSPITAL_COMMUNITY): Payer: Self-pay

## 2021-10-12 ENCOUNTER — Ambulatory Visit (HOSPITAL_COMMUNITY): Payer: Medicare Other

## 2021-10-12 ENCOUNTER — Other Ambulatory Visit: Payer: Self-pay

## 2021-10-12 DIAGNOSIS — M6281 Muscle weakness (generalized): Secondary | ICD-10-CM

## 2021-10-12 DIAGNOSIS — M545 Low back pain, unspecified: Secondary | ICD-10-CM

## 2021-10-12 DIAGNOSIS — G8929 Other chronic pain: Secondary | ICD-10-CM | POA: Diagnosis not present

## 2021-10-12 NOTE — Patient Instructions (Signed)
Housework - Vacuuming    Hold the vacuum with arm held at side. Step back and forth to move it, keeping head up. Avoid twisting.   Copyright  VHI. All rights reserved.   Housework: Sweeping    DO: Keep broom close to body. Keep back straight. Minimize bending and rotation of the back. Bend knees to avoid back strain. Sweep with short motions. Walk over to corners and hard-to-reach areas. DONT: Reach with broom into corners and hard-to-reach areas.  Copyright  VHI. All rights reserved.  Housework - Sweeping    Use long-handled equipment to avoid stooping.   Copyright  VHI. All rights reserved.

## 2021-10-12 NOTE — Therapy (Signed)
[place], [place], [address] Phone: [phone]   Fax:  [phone]  Physical Therapy Treatment  Patient Details  Name: Dorothy Ford MRN: [id number] Date of Birth: 1951-08-11 Referring Provider (PT): Duffy Rhody, MD   Encounter Date: 10/12/2021   PT End of Session - 10/12/21 0846     Visit Number 5    Number of Visits 8    Date for PT Re-Evaluation 10/20/21    Authorization Type UHC Medicare; Medicaid of [place] secondary    PT Start Time 0848    PT Stop Time 0930    PT Time Calculation (min) 42 min    Activity Tolerance Patient tolerated treatment well    Behavior During Therapy [facility] for tasks assessed/performed             Past Medical History:  Diagnosis Date   Acid reflux    Asthma    Cardiac dysrhythmia, unspecified    Carotid bruit    Bilateral   Cataract    Chest pain, unspecified    Chronic airway obstruction, not elsewhere classified    Chronic sinusitis    Congestive heart failure, unspecified    DDD (degenerative disc disease), cervical 07/24/2016   DDD (degenerative disc disease), lumbar 07/24/2016   Depressive disorder, not elsewhere classified    Diaphragmatic hernia without mention of obstruction or gangrene    Edema    left leg   Fibromyalgia    High cholesterol    History of rheumatoid arthritis    Hypothyroidism    Neuropathy    Obesity, unspecified    Obstructive lung disease (generalized) ([place])    Osteoarthritis of both hands 07/24/2016   Osteopenia    Osteoporosis 07/24/2016   Palpitations    Pseudogout involving multiple joints    Sleep apnea    SVT (supraventricular tachycardia) ([place])    Syncope    admitted 09/2011   Thyroid disease    Unspecified essential hypertension     Past Surgical History:  Procedure Laterality Date   CARPAL TUNNEL RELEASE Bilateral    CATHETER ABLATION  02/01/1997   @ [facility]   COLONOSCOPY WITH PROPOFOL N/A 08/18/2021    Procedure: COLONOSCOPY WITH PROPOFOL;  Surgeon: Harvel Quale, MD;  [facility];  Service: Gastroenterology;  Laterality: N/A;  12:45   INCONTINENCE SURGERY     POLYPECTOMY  08/18/2021   Procedure: POLYPECTOMY;  Surgeon: Harvel Quale, MD;  Location: [facility];  Service: Gastroenterology;;   removal toenail Bilateral    great toes   VEIN SURGERY BLADDER      There were no vitals filed for this visit.   Subjective Assessment - 10/12/21 0846     Subjective States she has been doing her HEP and feels it helps decrease her LBP. Both knees are aching today. Has been bending her knees to get to objects since last session and that has been helping her back.    Limitations Standing;Walking;Lifting;House hold activities    How long can you sit comfortably? better when sitting    How long can you stand comfortably? 10 min    Diagnostic tests MRI    Patient Stated Goals "have less pain when bending over"    Currently in Pain? Yes    Pain Score 8     Pain Location Back    Pain Orientation Posterior;Left    Pain Descriptors / Indicators Sharp    Pain Onset More than a  month ago                [facility] Adult PT Treatment/Exercise - 10/12/21 0001       Lumbar Exercises: Standing   Heel Raises 10 reps    Heel Raises Limitations 2 sets    Functional Squats 10 reps    Functional Squats Limitations 1 set with lifting empty box from floor    Row Strengthening;Both;10 reps;Theraband    Theraband Level (Row) Level 3 (Green)    Shoulder Extension Strengthening;Both;10 reps;Theraband    Theraband Level (Shoulder Extension) Level 3 (Green)    Other Standing Lumbar Exercises Paloff press NBOS x10 each      Lumbar Exercises: Seated   Sit to Stand 10 reps;5 reps   short of breathe   Sit to Stand Limitations 2 sets                PT Education - 10/12/21 0922     Education Details Mechanics for sweeping, mopping and picking objects up from the  floor.    Person(s) Educated Patient    Methods Explanation;Handout    Comprehension Verbalized understanding;Need further instruction              PT Short Term Goals - 10/12/21 1115       PT SHORT TERM GOAL #1   Title Patient will be independent with HEP in order to improve functional outcomes.    Time 2    Period Weeks    Status On-going    Target Date 10/06/21      PT SHORT TERM GOAL #2   Title Patient will report at least 25% improvement in symptoms for improved quality of life.    Baseline 10/10 back pain when bending over    Time 2    Period Weeks    Status On-going    Target Date 10/06/21      PT SHORT TERM GOAL #3   Title Demo proper squat mechanics with verbal cues to improve body mechanics and reduce back pain/strain w/ ADL/housekeeping    Baseline unable, forward flexes    Time 2    Period Weeks    Status On-going    Target Date 10/06/21               PT Long Term Goals - 10/12/21 1116       PT LONG TERM GOAL #1   Title Patient will improve FOTO score by at least 5 points in order to indicate improved tolerance to activity.    Baseline 43.54% function    Time 4    Period Weeks    Status On-going    Target Date 10/20/21      PT LONG TERM GOAL #2   Title Demo good body mechanics with squat lifting with indepenendence, no verbal/tactile cues needed.    Baseline unable    Time 4    Period Weeks    Status On-going    Target Date 10/20/21      PT LONG TERM GOAL #3   Title Demo 4/5 trunk flexion strength to improve lumbar stabilization    Baseline 3/5    Time 4    Period Weeks    Status On-going    Target Date 10/20/21                   Plan - 10/12/21 0847     Clinical Impression Statement Session focused on core and lower extremity strengthening and education  on body mechanics for functional activities at home, sweeping, mopping and picking up objects from floor. Added standing rows, shoulder extension Paloff press and lifting  an unweighted box from the floor for strengthening and stabilization. Patient required frequent rest breaks due to shortness of breathe. Patient would continue to benefit from skilled physical therapy to reduce impairment and improve function.    Personal Factors and Comorbidities Age;Comorbidity 3+;Time since onset of injury/illness/exacerbation    Comorbidities respiratory, cardiac, multipe orthopedic ailments    Examination-Activity Limitations Bed Mobility;Bend;Carry;Lift;Stand;Stairs;Squat;Locomotion Level    Examination-Participation Restrictions Cleaning;Community Activity;Meal Prep    Stability/Clinical Decision Making Evolving/Moderate complexity    Rehab Potential Fair    PT Frequency 2x / week    PT Duration 4 weeks    PT Treatment/Interventions ADLs/Self Care Home Management;Electrical Stimulation;DME Instruction;Traction;Gait training;Stair training;Functional mobility training;Therapeutic activities;Therapeutic exercise;Balance training;Patient/family education;Neuromuscular re-education;Manual techniques;Passive range of motion;Taping;Dry needling;Spinal Manipulations;Joint Manipulations    PT Next Visit Plan continue with POC focusing on core strengthening; balance    PT Home Exercise Plan LTR; 2/2 - decompression exercises 1-5; 2/3: abdominal set seated, heel raises, functional squat, sit to stand, knee to chest    Consulted and Agree with Plan of Care Patient             Patient will benefit from skilled therapeutic intervention in order to improve the following deficits and impairments:  Decreased activity tolerance, Decreased mobility, Decreased endurance, Decreased range of motion, Decreased strength, Difficulty walking, Hypomobility, Impaired flexibility, Improper body mechanics, Pain, Obesity  Visit Diagnosis: Muscle weakness (generalized)  Chronic low back pain, unspecified back pain laterality, unspecified whether sciatica present     Problem List Patient  Active Problem List   Diagnosis Date Noted   Ankle joint pain 12/07/2019   Low back pain 12/07/2019   Lumbar radiculopathy 12/07/2019   Pain of left upper arm 12/07/2019   Acute foot pain, left 04/15/2019   Bursitis/tendonitis, shoulder 04/23/2018   Inflammatory arthropathy 02/20/2018   Shoulder pain 02/20/2018   Pain in both knees 11/20/2017   Chondromalacia patellae, left knee 01/15/2017   Chondromalacia patellae, right knee 01/15/2017   Osteoarthritis of both hands 07/24/2016   DDD (degenerative disc disease), cervical 07/24/2016   DDD (degenerative disc disease), lumbar 07/24/2016   Osteoporosis 07/24/2016   Glaucoma 07/24/2016   Sleep apnea 07/24/2016   Pseudogout 07/19/2016   Primary osteoarthritis of both knees 07/19/2016   Fibromyalgia 07/19/2016   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   Pulmonary embolism ([place]) 01/18/2012   Anxiety 01/18/2012   Syncope    RHEUMATOID LUNG 12/30/2009   OBESITY, UNSPECIFIED 06/02/2009   DEPRESSIVE DISORDER NOT ELSEWHERE CLASSIFIED 06/02/2009   SUPRAVENTRICULAR TACHYCARDIA 06/02/2009   Chronic diastolic heart failure ([place]) 06/02/2009   COPD 06/02/2009   DIAPHRAGMAT HERN W/O MENTION OBSTRUCTION/GANGREN 06/02/2009   PALPITATIONS 06/02/2009   CHEST PAIN UNSPECIFIED 06/02/2009   Pamala Hurry D. Hartnett-Rands, MS, PT [place] [phone]  Jeannie Done, PT 10/12/2021, 11:16 AM  [place], [place], [address] Phone: [phone]   Fax:  [phone]  Name: SAFIRA PROFFIT MRN: [id number] Date of Birth: 1951/05/28

## 2021-10-13 ENCOUNTER — Encounter (HOSPITAL_COMMUNITY): Payer: Medicare Other | Admitting: Physical Therapy

## 2021-10-13 DIAGNOSIS — I10 Essential (primary) hypertension: Secondary | ICD-10-CM | POA: Diagnosis not present

## 2021-10-13 DIAGNOSIS — Z0000 Encounter for general adult medical examination without abnormal findings: Secondary | ICD-10-CM | POA: Diagnosis not present

## 2021-10-13 DIAGNOSIS — M545 Low back pain, unspecified: Secondary | ICD-10-CM | POA: Diagnosis not present

## 2021-10-13 DIAGNOSIS — G4739 Other sleep apnea: Secondary | ICD-10-CM | POA: Diagnosis not present

## 2021-10-13 DIAGNOSIS — Z7901 Long term (current) use of anticoagulants: Secondary | ICD-10-CM | POA: Diagnosis not present

## 2021-10-17 DIAGNOSIS — R269 Unspecified abnormalities of gait and mobility: Secondary | ICD-10-CM | POA: Diagnosis not present

## 2021-10-17 DIAGNOSIS — M545 Low back pain, unspecified: Secondary | ICD-10-CM | POA: Diagnosis not present

## 2021-10-17 DIAGNOSIS — M25519 Pain in unspecified shoulder: Secondary | ICD-10-CM | POA: Diagnosis not present

## 2021-10-17 DIAGNOSIS — M79622 Pain in left upper arm: Secondary | ICD-10-CM | POA: Diagnosis not present

## 2021-10-17 DIAGNOSIS — G5603 Carpal tunnel syndrome, bilateral upper limbs: Secondary | ICD-10-CM | POA: Diagnosis not present

## 2021-10-17 DIAGNOSIS — M797 Fibromyalgia: Secondary | ICD-10-CM | POA: Diagnosis not present

## 2021-10-17 DIAGNOSIS — M25579 Pain in unspecified ankle and joints of unspecified foot: Secondary | ICD-10-CM | POA: Diagnosis not present

## 2021-10-17 DIAGNOSIS — Z79891 Long term (current) use of opiate analgesic: Secondary | ICD-10-CM | POA: Diagnosis not present

## 2021-10-17 DIAGNOSIS — M5416 Radiculopathy, lumbar region: Secondary | ICD-10-CM | POA: Diagnosis not present

## 2021-10-19 ENCOUNTER — Telehealth (HOSPITAL_COMMUNITY): Payer: Self-pay | Admitting: Physical Therapy

## 2021-10-19 ENCOUNTER — Encounter (HOSPITAL_COMMUNITY): Payer: Medicare Other | Admitting: Physical Therapy

## 2021-10-19 NOTE — Telephone Encounter (Signed)
Lack of transportation her sister is sick and can not bring her

## 2021-10-20 ENCOUNTER — Ambulatory Visit (HOSPITAL_COMMUNITY): Payer: Medicare Other | Admitting: Physical Therapy

## 2021-10-20 ENCOUNTER — Other Ambulatory Visit: Payer: Self-pay

## 2021-10-20 DIAGNOSIS — M545 Low back pain, unspecified: Secondary | ICD-10-CM | POA: Diagnosis not present

## 2021-10-20 DIAGNOSIS — M6281 Muscle weakness (generalized): Secondary | ICD-10-CM | POA: Diagnosis not present

## 2021-10-20 DIAGNOSIS — G8929 Other chronic pain: Secondary | ICD-10-CM | POA: Diagnosis not present

## 2021-10-20 NOTE — Therapy (Signed)
[place], [place], [address] Phone: [phone]   Fax:  [phone]  Physical Therapy Treatment  Patient Details  Name: Dorothy Ford MRN: [id number] Date of Birth: Jun 13, 1951 Referring Provider (PT): Duffy Rhody, MD   Encounter Date: 10/20/2021   PT End of Session - 10/20/21 0944     Visit Number 6    Number of Visits 8    Date for PT Re-Evaluation 10/20/21    Authorization Type UHC Medicare; Medicaid of [place] secondary    PT Start Time 0945    PT Stop Time 1025    PT Time Calculation (min) 40 min    Activity Tolerance Patient tolerated treatment well    Behavior During Therapy [facility] for tasks assessed/performed             Past Medical History:  Diagnosis Date   Acid reflux    Asthma    Cardiac dysrhythmia, unspecified    Carotid bruit    Bilateral   Cataract    Chest pain, unspecified    Chronic airway obstruction, not elsewhere classified    Chronic sinusitis    Congestive heart failure, unspecified    DDD (degenerative disc disease), cervical 07/24/2016   DDD (degenerative disc disease), lumbar 07/24/2016   Depressive disorder, not elsewhere classified    Diaphragmatic hernia without mention of obstruction or gangrene    Edema    left leg   Fibromyalgia    High cholesterol    History of rheumatoid arthritis    Hypothyroidism    Neuropathy    Obesity, unspecified    Obstructive lung disease (generalized) ([place])    Osteoarthritis of both hands 07/24/2016   Osteopenia    Osteoporosis 07/24/2016   Palpitations    Pseudogout involving multiple joints    Sleep apnea    SVT (supraventricular tachycardia) ([place])    Syncope    admitted 09/2011   Thyroid disease    Unspecified essential hypertension     Past Surgical History:  Procedure Laterality Date   CARPAL TUNNEL RELEASE Bilateral    CATHETER ABLATION  02/01/1997   @ [facility]   COLONOSCOPY WITH PROPOFOL N/A 08/18/2021    Procedure: COLONOSCOPY WITH PROPOFOL;  Surgeon: Harvel Quale, MD;  [facility];  Service: Gastroenterology;  Laterality: N/A;  12:45   INCONTINENCE SURGERY     POLYPECTOMY  08/18/2021   Procedure: POLYPECTOMY;  Surgeon: Harvel Quale, MD;  [facility];  Service: Gastroenterology;;   removal toenail Bilateral    great toes   VEIN SURGERY BLADDER      There were no vitals filed for this visit.   Subjective Assessment - 10/20/21 0950     Subjective Patient reports that she is having a flare up of her back pain as well as her gout. She is unsure of whether it is due to the weather or not, but she is also having allergies.    Limitations Standing;Walking;Lifting;House hold activities    How long can you sit comfortably? better when sitting    How long can you stand comfortably? 10 min    Diagnostic tests MRI    Patient Stated Goals "have less pain when bending over"    Currently in Pain? Yes    Pain Score 7     Pain Location Back    Pain Orientation Posterior;Left    Pain Onset More than a month ago  [facility] Adult PT Treatment/Exercise - 10/20/21 0001       Lumbar Exercises: Stretches   Lower Trunk Rotation Other (comment)   10x3s holds each   Piriformis Stretch 5 reps;10 seconds      Lumbar Exercises: Aerobic   Nustep x66min(seat lvl 8)      Manual Therapy   Manual Therapy Passive ROM    Manual therapy comments Thomas stretch 10x20s holds each                       PT Short Term Goals - 10/12/21 1115       PT SHORT TERM GOAL #1   Title Patient will be independent with HEP in order to improve functional outcomes.    Time 2    Period Weeks    Status On-going    Target Date 10/06/21      PT SHORT TERM GOAL #2   Title Patient will report at least 25% improvement in symptoms for improved quality of life.    Baseline 10/10 back pain when bending over    Time 2     Period Weeks    Status On-going    Target Date 10/06/21      PT SHORT TERM GOAL #3   Title Demo proper squat mechanics with verbal cues to improve body mechanics and reduce back pain/strain w/ ADL/housekeeping    Baseline unable, forward flexes    Time 2    Period Weeks    Status On-going    Target Date 10/06/21               PT Long Term Goals - 10/12/21 1116       PT LONG TERM GOAL #1   Title Patient will improve FOTO score by at least 5 points in order to indicate improved tolerance to activity.    Baseline 43.54% function    Time 4    Period Weeks    Status On-going    Target Date 10/20/21      PT LONG TERM GOAL #2   Title Demo good body mechanics with squat lifting with indepenendence, no verbal/tactile cues needed.    Baseline unable    Time 4    Period Weeks    Status On-going    Target Date 10/20/21      PT LONG TERM GOAL #3   Title Demo 4/5 trunk flexion strength to improve lumbar stabilization    Baseline 3/5    Time 4    Period Weeks    Status On-going    Target Date 10/20/21                   Plan - 10/20/21 1245     Clinical Impression Statement Patient tolerated treatment well during today's session, but had difficulty with relaxing the LLE during the [facility] stretch. Anterior hip mobility improved throughout the session as her hip extension angle gradually improved. The weight of her own legs was reported as enough of a stretch that no overpressure was used during this session. Reduced ROM available during the piriformis stretch on the Rt side.    Personal Factors and Comorbidities Age;Comorbidity 3+;Time since onset of injury/illness/exacerbation    Comorbidities respiratory, cardiac, multipe orthopedic ailments    Examination-Activity Limitations Bed Mobility;Bend;Carry;Lift;Stand;Stairs;Squat;Locomotion Level    Examination-Participation Restrictions Cleaning;Community Activity;Meal Prep    Stability/Clinical Decision Making  Evolving/Moderate complexity    Rehab Potential Fair    PT Frequency 2x / week  PT Duration 4 weeks    PT Treatment/Interventions ADLs/Self Care Home Management;Electrical Stimulation;DME Instruction;Traction;Gait training;Stair training;Functional mobility training;Therapeutic activities;Therapeutic exercise;Balance training;Patient/family education;Neuromuscular re-education;Manual techniques;Passive range of motion;Taping;Dry needling;Spinal Manipulations;Joint Manipulations    PT Next Visit Plan continue with Piriformis stretch and POC focusing on core strengthening; balance    PT Home Exercise Plan LTR; 2/2 - decompression exercises 1-5; 2/3: abdominal set seated, heel raises, functional squat, sit to stand, knee to chest    Consulted and Agree with Plan of Care Patient             Patient will benefit from skilled therapeutic intervention in order to improve the following deficits and impairments:  Decreased activity tolerance, Decreased mobility, Decreased endurance, Decreased range of motion, Decreased strength, Difficulty walking, Hypomobility, Impaired flexibility, Improper body mechanics, Pain, Obesity  Visit Diagnosis: Muscle weakness (generalized)  Chronic low back pain, unspecified back pain laterality, unspecified whether sciatica present     Problem List Patient Active Problem List   Diagnosis Date Noted   Ankle joint pain 12/07/2019   Low back pain 12/07/2019   Lumbar radiculopathy 12/07/2019   Pain of left upper arm 12/07/2019   Acute foot pain, left 04/15/2019   Bursitis/tendonitis, shoulder 04/23/2018   Inflammatory arthropathy 02/20/2018   Shoulder pain 02/20/2018   Pain in both knees 11/20/2017   Chondromalacia patellae, left knee 01/15/2017   Chondromalacia patellae, right knee 01/15/2017   Osteoarthritis of both hands 07/24/2016   DDD (degenerative disc disease), cervical 07/24/2016   DDD (degenerative disc disease), lumbar 07/24/2016   Osteoporosis  07/24/2016   Glaucoma 07/24/2016   Sleep apnea 07/24/2016   Pseudogout 07/19/2016   Primary osteoarthritis of both knees 07/19/2016   Fibromyalgia 07/19/2016   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   Pulmonary embolism ([facility]) 01/18/2012   Anxiety 01/18/2012   Syncope    RHEUMATOID LUNG 12/30/2009   OBESITY, UNSPECIFIED 06/02/2009   DEPRESSIVE DISORDER NOT ELSEWHERE CLASSIFIED 06/02/2009   SUPRAVENTRICULAR TACHYCARDIA 06/02/2009   Chronic diastolic heart failure ([place]) 06/02/2009   COPD 06/02/2009   DIAPHRAGMAT HERN W/O MENTION OBSTRUCTION/GANGREN 06/02/2009   PALPITATIONS 06/02/2009   CHEST PAIN UNSPECIFIED 06/02/2009    Adalberto Cole, PT 10/20/2021, 10:31 AM   [facility] [place], [place], [address] Phone: [phone]   Fax:  [phone]  Name: Dorothy Ford MRN: [id number] Date of Birth: 09-08-1950

## 2021-10-23 DIAGNOSIS — M47816 Spondylosis without myelopathy or radiculopathy, lumbar region: Secondary | ICD-10-CM | POA: Diagnosis not present

## 2021-10-24 DIAGNOSIS — Z01818 Encounter for other preprocedural examination: Secondary | ICD-10-CM | POA: Diagnosis not present

## 2021-10-24 DIAGNOSIS — H25812 Combined forms of age-related cataract, left eye: Secondary | ICD-10-CM | POA: Diagnosis not present

## 2021-10-24 DIAGNOSIS — H25811 Combined forms of age-related cataract, right eye: Secondary | ICD-10-CM | POA: Diagnosis not present

## 2021-10-27 ENCOUNTER — Other Ambulatory Visit: Payer: Self-pay

## 2021-10-27 ENCOUNTER — Ambulatory Visit (HOSPITAL_COMMUNITY): Payer: Medicare Other | Admitting: Physical Therapy

## 2021-10-27 DIAGNOSIS — M545 Low back pain, unspecified: Secondary | ICD-10-CM | POA: Diagnosis not present

## 2021-10-27 DIAGNOSIS — M6281 Muscle weakness (generalized): Secondary | ICD-10-CM | POA: Diagnosis not present

## 2021-10-27 DIAGNOSIS — G8929 Other chronic pain: Secondary | ICD-10-CM | POA: Diagnosis not present

## 2021-10-27 NOTE — Therapy (Signed)
[place], [place], [address] Phone: [phone]   Fax:  [phone]  Physical Therapy Treatment  Patient Details  Name: Dorothy Ford MRN: [id number] Date of Birth: Jun 12, 1951 Referring Provider (PT): Duffy Rhody, MD   Encounter Date: 10/27/2021   PT End of Session - 10/27/21 0856     Visit Number 7    Number of Visits 8    Date for PT Re-Evaluation 10/20/21    Authorization Type UHC Medicare; Medicaid of [place] secondary    PT Start Time 0858    PT Stop Time 0946    PT Time Calculation (min) 48 min    Activity Tolerance Patient tolerated treatment well    Behavior During Therapy [facility] for tasks assessed/performed             Past Medical History:  Diagnosis Date   Acid reflux    Asthma    Cardiac dysrhythmia, unspecified    Carotid bruit    Bilateral   Cataract    Chest pain, unspecified    Chronic airway obstruction, not elsewhere classified    Chronic sinusitis    Congestive heart failure, unspecified    DDD (degenerative disc disease), cervical 07/24/2016   DDD (degenerative disc disease), lumbar 07/24/2016   Depressive disorder, not elsewhere classified    Diaphragmatic hernia without mention of obstruction or gangrene    Edema    left leg   Fibromyalgia    High cholesterol    History of rheumatoid arthritis    Hypothyroidism    Neuropathy    Obesity, unspecified    Obstructive lung disease (generalized) ([place])    Osteoarthritis of both hands 07/24/2016   Osteopenia    Osteoporosis 07/24/2016   Palpitations    Pseudogout involving multiple joints    Sleep apnea    SVT (supraventricular tachycardia) ([place])    Syncope    admitted 09/2011   Thyroid disease    Unspecified essential hypertension     Past Surgical History:  Procedure Laterality Date   CARPAL TUNNEL RELEASE Bilateral    CATHETER ABLATION  02/01/1997   @ [facility]   COLONOSCOPY WITH PROPOFOL N/A 08/18/2021    Procedure: COLONOSCOPY WITH PROPOFOL;  Surgeon: Harvel Quale, MD;  [facility];  Service: Gastroenterology;  Laterality: N/A;  12:45   INCONTINENCE SURGERY     POLYPECTOMY  08/18/2021   Procedure: POLYPECTOMY;  Surgeon: Harvel Quale, MD;  Location: [facility];  Service: Gastroenterology;;   removal toenail Bilateral    great toes   VEIN SURGERY BLADDER      There were no vitals filed for this visit.   Subjective Assessment - 10/27/21 0857     Subjective Patient reports that her sciatica worsened following the previous session and is slowly went away until today.    Limitations Standing;Walking;Lifting;House hold activities    How long can you sit comfortably? better when sitting    How long can you stand comfortably? 10 min    Diagnostic tests MRI    Patient Stated Goals "have less pain when bending over"    Currently in Pain? Yes    Pain Score 7     Pain Location Back    Pain Orientation Posterior;Left;Right    Pain Descriptors / Indicators Sharp    Pain Type Chronic pain    Pain Onset More than a month ago  [facility] PT Treatment/Exercise - 10/27/21 0001       Ambulation/Gait   Ambulation/Gait Yes    Ambulation/Gait Assistance 6: Modified independent (Device/Increase time)    Ambulation Distance (Feet) 120 Feet    Assistive device Straight cane    Gait Pattern --   Cueing for cane use in Lt hand   Ambulation Surface Level      Exercises   Exercises Knee/Hip      Lumbar Exercises: Aerobic   Nustep x56min(seat level 7)      Knee/Hip Exercises: Stretches   Piriformis Stretch Other (comment)   4x15s each   Other Knee/Hip Stretches Supine cross chest hip flexion stretch 4x15s each, hip IR stretch 4x15s Lt and 6x15s Rt, and Bent knee fallout stretch 4x15s      Knee/Hip Exercises: Standing   Other Standing Knee Exercises Body Craft Glute punches #2 2x8                        PT Short Term Goals - 10/12/21 1115       PT SHORT TERM GOAL #1   Title Patient will be independent with HEP in order to improve functional outcomes.    Time 2    Period Weeks    Status On-going    Target Date 10/06/21      PT SHORT TERM GOAL #2   Title Patient will report at least 25% improvement in symptoms for improved quality of life.    Baseline 10/10 back pain when bending over    Time 2    Period Weeks    Status On-going    Target Date 10/06/21      PT SHORT TERM GOAL #3   Title Demo proper squat mechanics with verbal cues to improve body mechanics and reduce back pain/strain w/ ADL/housekeeping    Baseline unable, forward flexes    Time 2    Period Weeks    Status On-going    Target Date 10/06/21               PT Long Term Goals - 10/12/21 1116       PT LONG TERM GOAL #1   Title Patient will improve FOTO score by at least 5 points in order to indicate improved tolerance to activity.    Baseline 43.54% function    Time 4    Period Weeks    Status On-going    Target Date 10/20/21      PT LONG TERM GOAL #2   Title Demo good body mechanics with squat lifting with indepenendence, no verbal/tactile cues needed.    Baseline unable    Time 4    Period Weeks    Status On-going    Target Date 10/20/21      PT LONG TERM GOAL #3   Title Demo 4/5 trunk flexion strength to improve lumbar stabilization    Baseline 3/5    Time 4    Period Weeks    Status On-going    Target Date 10/20/21                   Plan - 10/27/21 0856     Clinical Impression Statement Patient tolerated treatment well during today's session and she did receive gait training with cane use in the Lt hand. She had a marked difference in hip IR ROM during the IR stretch with the Rt hip being the more restricted of the two. She reportedly  has a surgery scheduled the day after her final treatment session here on 11/02/2021.    Personal Factors and Comorbidities  Age;Comorbidity 3+;Time since onset of injury/illness/exacerbation    Comorbidities respiratory, cardiac, multipe orthopedic ailments    Examination-Activity Limitations Bed Mobility;Bend;Carry;Lift;Stand;Stairs;Squat;Locomotion Level    Examination-Participation Restrictions Cleaning;Community Activity;Meal Prep    Stability/Clinical Decision Making Evolving/Moderate complexity    Rehab Potential Fair    PT Frequency 2x / week    PT Duration 4 weeks    PT Treatment/Interventions ADLs/Self Care Home Management;Electrical Stimulation;DME Instruction;Traction;Gait training;Stair training;Functional mobility training;Therapeutic activities;Therapeutic exercise;Balance training;Patient/family education;Neuromuscular re-education;Manual techniques;Passive range of motion;Taping;Dry needling;Spinal Manipulations;Joint Manipulations    PT Next Visit Plan Discharge during the next session assuming no regression occurs.    PT Home Exercise Plan LTR; 2/2 - decompression exercises 1-5; 2/3: abdominal set seated, heel raises, functional squat, sit to stand, knee to chest    Consulted and Agree with Plan of Care Patient             Patient will benefit from skilled therapeutic intervention in order to improve the following deficits and impairments:  Decreased activity tolerance, Decreased mobility, Decreased endurance, Decreased range of motion, Decreased strength, Difficulty walking, Hypomobility, Impaired flexibility, Improper body mechanics, Pain, Obesity  Visit Diagnosis: Muscle weakness (generalized)  Chronic low back pain, unspecified back pain laterality, unspecified whether sciatica present     Problem List Patient Active Problem List   Diagnosis Date Noted   Ankle joint pain 12/07/2019   Low back pain 12/07/2019   Lumbar radiculopathy 12/07/2019   Pain of left upper arm 12/07/2019   Acute foot pain, left 04/15/2019   Bursitis/tendonitis, shoulder 04/23/2018   Inflammatory  arthropathy 02/20/2018   Shoulder pain 02/20/2018   Pain in both knees 11/20/2017   Chondromalacia patellae, left knee 01/15/2017   Chondromalacia patellae, right knee 01/15/2017   Osteoarthritis of both hands 07/24/2016   DDD (degenerative disc disease), cervical 07/24/2016   DDD (degenerative disc disease), lumbar 07/24/2016   Osteoporosis 07/24/2016   Glaucoma 07/24/2016   Sleep apnea 07/24/2016   Pseudogout 07/19/2016   Primary osteoarthritis of both knees 07/19/2016   Fibromyalgia 07/19/2016   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   Pulmonary embolism ([place]) 01/18/2012   Anxiety 01/18/2012   Syncope    RHEUMATOID LUNG 12/30/2009   OBESITY, UNSPECIFIED 06/02/2009   DEPRESSIVE DISORDER NOT ELSEWHERE CLASSIFIED 06/02/2009   SUPRAVENTRICULAR TACHYCARDIA 06/02/2009   Chronic diastolic heart failure ([place]) 06/02/2009   COPD 06/02/2009   DIAPHRAGMAT HERN W/O MENTION OBSTRUCTION/GANGREN 06/02/2009   PALPITATIONS 06/02/2009   CHEST PAIN UNSPECIFIED 06/02/2009    Adalberto Cole, PT 10/27/2021, 10:14 AM  [facility] [address] [place], [place], [address] Phone: [phone]   Fax:  [phone]  Name: KELCE BOUTON MRN: [id number] Date of Birth: 17-May-1951

## 2021-10-27 NOTE — Patient Instructions (Signed)
[email or web address]

## 2021-10-31 DIAGNOSIS — R0902 Hypoxemia: Secondary | ICD-10-CM | POA: Diagnosis not present

## 2021-10-31 DIAGNOSIS — R509 Fever, unspecified: Secondary | ICD-10-CM | POA: Diagnosis not present

## 2021-10-31 DIAGNOSIS — I10 Essential (primary) hypertension: Secondary | ICD-10-CM | POA: Diagnosis not present

## 2021-10-31 DIAGNOSIS — U071 COVID-19: Secondary | ICD-10-CM | POA: Diagnosis not present

## 2021-11-01 ENCOUNTER — Other Ambulatory Visit: Payer: Self-pay

## 2021-11-01 ENCOUNTER — Ambulatory Visit (HOSPITAL_COMMUNITY): Payer: Medicare Other | Attending: Neurosurgery | Admitting: Physical Therapy

## 2021-11-01 ENCOUNTER — Encounter (HOSPITAL_COMMUNITY): Payer: Self-pay | Admitting: Physical Therapy

## 2021-11-01 DIAGNOSIS — M545 Low back pain, unspecified: Secondary | ICD-10-CM | POA: Diagnosis not present

## 2021-11-01 DIAGNOSIS — M6281 Muscle weakness (generalized): Secondary | ICD-10-CM | POA: Diagnosis not present

## 2021-11-01 DIAGNOSIS — G8929 Other chronic pain: Secondary | ICD-10-CM | POA: Diagnosis not present

## 2021-11-01 NOTE — Therapy (Signed)
[facility] [address] [place], [place], [address] Phone: [phone]   Fax:  [phone]  Physical Therapy Treatment  Patient Details  Name: Dorothy Ford MRN: [id number] Date of Birth: [birth year] Referring Provider (PT): Duffy Rhody, MD  PHYSICAL THERAPY DISCHARGE SUMMARY  Visits from Start of Care: 8  Current functional level related to goals / functional outcomes: See below    Remaining deficits: See below    Education / Equipment: See assessment    Patient agrees to discharge. Patient goals were met. Patient is being discharged due to being pleased with the current functional level.   Encounter Date: 11/01/2021   PT End of Session - 11/01/21 1703     Visit Number 8    Number of Visits 8    Date for PT Re-Evaluation 11/01/21    Authorization Type UHC Medicare; Medicaid of [place] secondary    PT Start Time 1658   arrives late   PT Stop Time 1723    PT Time Calculation (min) 25 min    Activity Tolerance Patient tolerated treatment well    Behavior During Therapy [facility] for tasks assessed/performed             Past Medical History:  Diagnosis Date   Acid reflux    Asthma    Cardiac dysrhythmia, unspecified    Carotid bruit    Bilateral   Cataract    Chest pain, unspecified    Chronic airway obstruction, not elsewhere classified    Chronic sinusitis    Congestive heart failure, unspecified    DDD (degenerative disc disease), cervical 07/24/2016   DDD (degenerative disc disease), lumbar 07/24/2016   Depressive disorder, not elsewhere classified    Diaphragmatic hernia without mention of obstruction or gangrene    Edema    left leg   Fibromyalgia    High cholesterol    History of rheumatoid arthritis    Hypothyroidism    Neuropathy    Obesity, unspecified    Obstructive lung disease (generalized) ([place])    Osteoarthritis of both hands 07/24/2016   Osteopenia    Osteoporosis 07/24/2016   Palpitations     Pseudogout involving multiple joints    Sleep apnea    SVT (supraventricular tachycardia) ([place])    Syncope    admitted 09/2011   Thyroid disease    Unspecified essential hypertension     Past Surgical History:  Procedure Laterality Date   CARPAL TUNNEL RELEASE Bilateral    CATHETER ABLATION  02/01/1997   @ [facility]   COLONOSCOPY WITH PROPOFOL N/A 08/18/2021   Procedure: COLONOSCOPY WITH PROPOFOL;  Surgeon: Harvel Quale, MD;  [facility];  Service: Gastroenterology;  Laterality: N/A;  12:45   INCONTINENCE SURGERY     POLYPECTOMY  08/18/2021   Procedure: POLYPECTOMY;  Surgeon: Harvel Quale, MD;  [facility];  Service: Gastroenterology;;   removal toenail Bilateral    great toes   VEIN SURGERY BLADDER      There were no vitals filed for this visit.   Subjective Assessment - 11/01/21 1702     Subjective Patient says she is doing much better. She reports about 50% improvement since starting therapy. Still has occasional sciatica, but feels she is ready for DC from therapy today.    Limitations Standing;Walking;Lifting;House hold activities    How long can you sit comfortably? better when sitting    How long can you stand comfortably? 10 min  Diagnostic tests MRI    Patient Stated Goals "have less pain when bending over"    Currently in Pain? Yes    Pain Score 7     Pain Location Back    Pain Orientation Posterior;Lower    Pain Descriptors / Indicators Sharp    Pain Type Chronic pain    Pain Onset More than a month ago    Pain Frequency Intermittent                [facility] PT Assessment - 11/01/21 0001       Assessment   Medical Diagnosis lumbar facet arthropathy    Referring Provider (PT) Duffy Rhody, MD      Restrictions   Weight Bearing Restrictions No      Balance Screen   Has the patient fallen in the past 6 months No      [place] residence      Prior Function    Level of Independence Independent with basic ADLs;Independent with household mobility without device;Independent with community mobility with device      Cognition   Overall Cognitive Status Within Functional Limits for tasks assessed      Observation/Other Assessments   Focus on Therapeutic Outcomes (FOTO)  59% function   was 44% function     Strength   Strength Assessment Site Hip;Knee    Right/Left Hip Right;Left    Right Hip Flexion 5/5    Left Hip Flexion 5/5    Right/Left Knee Left;Right    Right Knee Extension 4+/5    Left Knee Extension 4+/5    Lumbar Flexion 4/5                                      PT Short Term Goals - 11/01/21 1710       PT SHORT TERM GOAL #1   Title Patient will be independent with HEP in order to improve functional outcomes.    Time 2    Period Weeks    Status Achieved    Target Date 10/06/21      PT SHORT TERM GOAL #2   Title Patient will report at least 25% improvement in symptoms for improved quality of life.    Baseline reports 50%    Time 2    Period Weeks    Status Achieved    Target Date 10/06/21      PT SHORT TERM GOAL #3   Title Demo proper squat mechanics with verbal cues to improve body mechanics and reduce back pain/strain w/ ADL/housekeeping    Time 2    Period Weeks    Status Achieved    Target Date 10/06/21               PT Long Term Goals - 11/01/21 1710       PT LONG TERM GOAL #1   Title Patient will improve FOTO score by at least 5 points in order to indicate improved tolerance to activity.    Time 4    Period Weeks    Status Achieved    Target Date 10/20/21      PT LONG TERM GOAL #2   Title Demo good body mechanics with squat lifting with indepenendence, no verbal/tactile cues needed.    Time 4    Period Weeks    Status Achieved    Target Date 10/20/21  PT LONG TERM GOAL #3   Title Demo 4/5 trunk flexion strength to improve lumbar stabilization    Baseline 3/5     Time 4    Period Weeks    Status Achieved    Target Date 10/20/21                   Plan - 11/01/21 1719     Clinical Impression Statement Patient has made good progress toward therapy goals and has currently met/ partially met all long term goals. Patient shows improved trunk strength and body mechanics with bending and lifting. Patient does present with ongoing hip weakness, but reports significant improvement in functional ability and subjective levels. Patient verbalizes good recall with HEP, reviewed home exercises with patient and answered all questions. Issued updated HEP handout. Patient encouraged to follow up with therapy services with any further questions or concerns.    Personal Factors and Comorbidities Age;Comorbidity 3+;Time since onset of injury/illness/exacerbation    Comorbidities respiratory, cardiac, multipe orthopedic ailments    Examination-Activity Limitations Bed Mobility;Bend;Carry;Lift;Stand;Stairs;Squat;Locomotion Level    Examination-Participation Restrictions Cleaning;Community Activity;Meal Prep    Stability/Clinical Decision Making Evolving/Moderate complexity    Rehab Potential Fair    PT Treatment/Interventions ADLs/Self Care Home Management;Electrical Stimulation;DME Instruction;Traction;Gait training;Stair training;Functional mobility training;Therapeutic activities;Therapeutic exercise;Balance training;Patient/family education;Neuromuscular re-education;Manual techniques;Passive range of motion;Taping;Dry needling;Spinal Manipulations;Joint Manipulations    PT Next Visit Plan Dc to HEP    PT Home Exercise Plan LTR; 2/2 - decompression exercises 1-5; 2/3: abdominal set seated, heel raises, functional squat, sit to stand, knee to chest    Consulted and Agree with Plan of Care Patient             Patient will benefit from skilled therapeutic intervention in order to improve the following deficits and impairments:  Decreased activity tolerance,  Decreased mobility, Decreased endurance, Decreased range of motion, Decreased strength, Difficulty walking, Hypomobility, Impaired flexibility, Improper body mechanics, Pain, Obesity  Visit Diagnosis: Muscle weakness (generalized)  Chronic low back pain, unspecified back pain laterality, unspecified whether sciatica present     Problem List Patient Active Problem List   Diagnosis Date Noted   Ankle joint pain 12/07/2019   Low back pain 12/07/2019   Lumbar radiculopathy 12/07/2019   Pain of left upper arm 12/07/2019   Acute foot pain, left 04/15/2019   Bursitis/tendonitis, shoulder 04/23/2018   Inflammatory arthropathy 02/20/2018   Shoulder pain 02/20/2018   Pain in both knees 11/20/2017   Chondromalacia patellae, left knee 01/15/2017   Chondromalacia patellae, right knee 01/15/2017   Osteoarthritis of both hands 07/24/2016   DDD (degenerative disc disease), cervical 07/24/2016   DDD (degenerative disc disease), lumbar 07/24/2016   Osteoporosis 07/24/2016   Glaucoma 07/24/2016   Sleep apnea 07/24/2016   Pseudogout 07/19/2016   Primary osteoarthritis of both knees 07/19/2016   Fibromyalgia 07/19/2016   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   Pulmonary embolism ([place]) 01/18/2012   Anxiety 01/18/2012   Syncope    RHEUMATOID LUNG 12/30/2009   OBESITY, UNSPECIFIED 06/02/2009   DEPRESSIVE DISORDER NOT ELSEWHERE CLASSIFIED 06/02/2009   SUPRAVENTRICULAR TACHYCARDIA 06/02/2009   Chronic diastolic heart failure ([place]) 06/02/2009   COPD 06/02/2009   DIAPHRAGMAT HERN W/O MENTION OBSTRUCTION/GANGREN 06/02/2009   PALPITATIONS 06/02/2009   CHEST PAIN UNSPECIFIED 06/02/2009   5:25 PM, 11/01/21 Josue Hector PT DPT  Physical Therapist with [facility]  [phone] [place] [address] [place], [place], [address] Phone: [phone]  Fax:  [phone]  Name: Dorothy Ford MRN: [id number] Date of Birth:  05-15-1951

## 2021-11-01 NOTE — Patient Instructions (Signed)
Access Code: [email or web address] ?URL: [email or web address] ?Date: 11/01/2021 ?Prepared by: Josue Hector ? ?Exercises ?Supine Lower Trunk Rotation - 1 x daily - 5 x weekly - 1 sets - 10 reps - 10 second hold ?Supine Single Knee to Chest Stretch - 1 x daily - 5 x weekly - 1 sets - 10 reps - 10 second hold ?Seated Transversus Abdominis Bracing - 1 x daily - 5 x weekly - 2 sets - 10 reps - 5 second hold ?Sit to Stand with Counter Support - 1 x daily - 5 x weekly - 2 sets - 10 reps ?Heel Raises with Counter Support - 1 x daily - 5 x weekly - 2 sets - 10 reps ? ?

## 2021-11-02 DIAGNOSIS — H25811 Combined forms of age-related cataract, right eye: Secondary | ICD-10-CM | POA: Diagnosis not present

## 2021-11-10 DIAGNOSIS — Z7901 Long term (current) use of anticoagulants: Secondary | ICD-10-CM | POA: Diagnosis not present

## 2021-11-15 DIAGNOSIS — M546 Pain in thoracic spine: Secondary | ICD-10-CM | POA: Diagnosis not present

## 2021-11-15 DIAGNOSIS — M5442 Lumbago with sciatica, left side: Secondary | ICD-10-CM | POA: Diagnosis not present

## 2021-11-15 DIAGNOSIS — M9901 Segmental and somatic dysfunction of cervical region: Secondary | ICD-10-CM | POA: Diagnosis not present

## 2021-11-15 DIAGNOSIS — M542 Cervicalgia: Secondary | ICD-10-CM | POA: Diagnosis not present

## 2021-11-15 DIAGNOSIS — M9902 Segmental and somatic dysfunction of thoracic region: Secondary | ICD-10-CM | POA: Diagnosis not present

## 2021-11-15 DIAGNOSIS — M9903 Segmental and somatic dysfunction of lumbar region: Secondary | ICD-10-CM | POA: Diagnosis not present

## 2021-11-22 DIAGNOSIS — M542 Cervicalgia: Secondary | ICD-10-CM | POA: Diagnosis not present

## 2021-11-22 DIAGNOSIS — M9902 Segmental and somatic dysfunction of thoracic region: Secondary | ICD-10-CM | POA: Diagnosis not present

## 2021-11-22 DIAGNOSIS — M546 Pain in thoracic spine: Secondary | ICD-10-CM | POA: Diagnosis not present

## 2021-11-22 DIAGNOSIS — M5442 Lumbago with sciatica, left side: Secondary | ICD-10-CM | POA: Diagnosis not present

## 2021-11-22 DIAGNOSIS — M9901 Segmental and somatic dysfunction of cervical region: Secondary | ICD-10-CM | POA: Diagnosis not present

## 2021-11-22 DIAGNOSIS — M9903 Segmental and somatic dysfunction of lumbar region: Secondary | ICD-10-CM | POA: Diagnosis not present

## 2021-11-23 DIAGNOSIS — H25812 Combined forms of age-related cataract, left eye: Secondary | ICD-10-CM | POA: Diagnosis not present

## 2021-12-01 DIAGNOSIS — R509 Fever, unspecified: Secondary | ICD-10-CM | POA: Diagnosis not present

## 2021-12-01 DIAGNOSIS — I10 Essential (primary) hypertension: Secondary | ICD-10-CM | POA: Diagnosis not present

## 2021-12-01 DIAGNOSIS — Z7901 Long term (current) use of anticoagulants: Secondary | ICD-10-CM | POA: Diagnosis not present

## 2021-12-01 DIAGNOSIS — U071 COVID-19: Secondary | ICD-10-CM | POA: Diagnosis not present

## 2021-12-01 DIAGNOSIS — R0902 Hypoxemia: Secondary | ICD-10-CM | POA: Diagnosis not present

## 2021-12-05 DIAGNOSIS — H2511 Age-related nuclear cataract, right eye: Secondary | ICD-10-CM | POA: Diagnosis not present

## 2021-12-06 DIAGNOSIS — Z20822 Contact with and (suspected) exposure to covid-19: Secondary | ICD-10-CM | POA: Diagnosis not present

## 2021-12-06 DIAGNOSIS — Z91040 Latex allergy status: Secondary | ICD-10-CM | POA: Diagnosis not present

## 2021-12-06 DIAGNOSIS — Z79899 Other long term (current) drug therapy: Secondary | ICD-10-CM | POA: Diagnosis not present

## 2021-12-06 DIAGNOSIS — Z86711 Personal history of pulmonary embolism: Secondary | ICD-10-CM | POA: Diagnosis not present

## 2021-12-06 DIAGNOSIS — J45901 Unspecified asthma with (acute) exacerbation: Secondary | ICD-10-CM | POA: Diagnosis not present

## 2021-12-06 DIAGNOSIS — Z7901 Long term (current) use of anticoagulants: Secondary | ICD-10-CM | POA: Diagnosis not present

## 2021-12-06 DIAGNOSIS — M797 Fibromyalgia: Secondary | ICD-10-CM | POA: Diagnosis not present

## 2021-12-06 DIAGNOSIS — R051 Acute cough: Secondary | ICD-10-CM | POA: Diagnosis not present

## 2021-12-06 DIAGNOSIS — Z885 Allergy status to narcotic agent status: Secondary | ICD-10-CM | POA: Diagnosis not present

## 2021-12-06 DIAGNOSIS — J069 Acute upper respiratory infection, unspecified: Secondary | ICD-10-CM | POA: Diagnosis not present

## 2021-12-06 DIAGNOSIS — I110 Hypertensive heart disease with heart failure: Secondary | ICD-10-CM | POA: Diagnosis not present

## 2021-12-06 DIAGNOSIS — J449 Chronic obstructive pulmonary disease, unspecified: Secondary | ICD-10-CM | POA: Diagnosis not present

## 2021-12-06 DIAGNOSIS — E039 Hypothyroidism, unspecified: Secondary | ICD-10-CM | POA: Diagnosis not present

## 2021-12-06 DIAGNOSIS — I509 Heart failure, unspecified: Secondary | ICD-10-CM | POA: Diagnosis not present

## 2021-12-06 DIAGNOSIS — M109 Gout, unspecified: Secondary | ICD-10-CM | POA: Diagnosis not present

## 2021-12-06 DIAGNOSIS — R059 Cough, unspecified: Secondary | ICD-10-CM | POA: Diagnosis not present

## 2021-12-06 DIAGNOSIS — Z7951 Long term (current) use of inhaled steroids: Secondary | ICD-10-CM | POA: Diagnosis not present

## 2021-12-06 DIAGNOSIS — E785 Hyperlipidemia, unspecified: Secondary | ICD-10-CM | POA: Diagnosis not present

## 2021-12-06 DIAGNOSIS — M1991 Primary osteoarthritis, unspecified site: Secondary | ICD-10-CM | POA: Diagnosis not present

## 2021-12-06 DIAGNOSIS — Z888 Allergy status to other drugs, medicaments and biological substances status: Secondary | ICD-10-CM | POA: Diagnosis not present

## 2021-12-06 NOTE — Progress Notes (Signed)
? ?Office Visit Note ? ?Patient: Dorothy Ford             ?Date of Birth: 06-Dec-1950           ?MRN: [id number]             ?PCP: Neale Burly, MD ?Referring: Neale Burly, MD ?Visit Date: 12/20/2021 ?Occupation: '@GUAROCC'$ @ ? ?Subjective:  ?Left knee pain ? ?History of Present Illness: Dorothy Ford is a 71 y.o. female with a history of pseudogout and osteoarthritis.  She presents today with increased pain and discomfort in her left knee joint.  She states her knee and hip joint has been warm and swollen.  She is having difficulty walking.  She continues to have some discomfort in her left shoulder joint.  She complains of neck and lower back stiffness.  She continues to have some generalized pain and discomfort from fibromyalgia. ? ?Activities of Daily Living:  ?Patient reports morning stiffness for all day. ?Patient Reports nocturnal pain.  ?Difficulty dressing/grooming: Denies ?Difficulty climbing stairs: Reports ?Difficulty getting out of chair: Reports ?Difficulty using hands for taps, buttons, cutlery, and/or writing: Reports ? ?Review of Systems  ?Constitutional:  Positive for fatigue.  ?HENT:  Positive for mouth dryness. Negative for mouth sores and nose dryness.   ?Eyes:  Positive for dryness. Negative for pain and itching.  ?Respiratory:  Positive for shortness of breath. Negative for difficulty breathing.   ?     COPD,asthma  ?Cardiovascular:  Negative for chest pain and palpitations.  ?Gastrointestinal:  Negative for blood in stool, constipation and diarrhea.  ?Endocrine: Positive for cold intolerance. Negative for increased urination.  ?Genitourinary:  Negative for difficulty urinating.  ?Musculoskeletal:  Positive for joint pain, joint pain, joint swelling, myalgias, morning stiffness, muscle tenderness and myalgias.  ?Skin:  Negative for color change, rash, redness and sensitivity to sunlight.  ?Allergic/Immunologic: Negative for susceptible to infections.  ?Neurological:  Positive for  dizziness and numbness. Negative for headaches and memory loss.  ?Hematological:  Positive for bruising/bleeding tendency.  ?Psychiatric/Behavioral:  Negative for depressed mood and confusion. The patient is not nervous/anxious.   ? ?PMFS History:  ?Patient Active Problem List  ? Diagnosis Date Noted  ? Grade I hemorrhoids 12/19/2021  ? Ankle joint pain 12/07/2019  ? Low back pain 12/07/2019  ? Lumbar radiculopathy 12/07/2019  ? Pain of left upper arm 12/07/2019  ? Acute foot pain, left 04/15/2019  ? Bursitis/tendonitis, shoulder 04/23/2018  ? Inflammatory arthropathy 02/20/2018  ? Shoulder pain 02/20/2018  ? Pain in both knees 11/20/2017  ? Chondromalacia patellae, left knee 01/15/2017  ? Chondromalacia patellae, right knee 01/15/2017  ? Osteoarthritis of both hands 07/24/2016  ? DDD (degenerative disc disease), cervical 07/24/2016  ? DDD (degenerative disc disease), lumbar 07/24/2016  ? Osteoporosis 07/24/2016  ? Glaucoma 07/24/2016  ? Sleep apnea 07/24/2016  ? Pseudogout 07/19/2016  ? Primary osteoarthritis of both knees 07/19/2016  ? Fibromyalgia 07/19/2016  ? 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  ? Pulmonary embolism ([place]) 01/18/2012  ? Anxiety 01/18/2012  ? Syncope   ? RHEUMATOID LUNG 12/30/2009  ? OBESITY, UNSPECIFIED 06/02/2009  ? DEPRESSIVE DISORDER NOT ELSEWHERE CLASSIFIED 06/02/2009  ? SUPRAVENTRICULAR TACHYCARDIA 06/02/2009  ? Chronic diastolic heart failure ([place]) 06/02/2009  ? COPD 06/02/2009  ? DIAPHRAGMAT HERN W/O MENTION OBSTRUCTION/GANGREN 06/02/2009  ? PALPITATIONS 06/02/2009  ? CHEST PAIN UNSPECIFIED 06/02/2009  ?  ?Past Medical History:  ?Diagnosis Date  ? Acid reflux   ? Asthma   ? Cardiac dysrhythmia, unspecified   ?  Carotid bruit   ? Bilateral  ? Cataract   ? Chest pain, unspecified   ? Chronic airway obstruction, not elsewhere classified   ? Chronic sinusitis   ? Congestive heart failure, unspecified   ? DDD (degenerative disc disease), cervical 07/24/2016  ? DDD (degenerative disc disease), lumbar  07/24/2016  ? Depressive disorder, not elsewhere classified   ? Diaphragmatic hernia without mention of obstruction or gangrene   ? Edema   ? left leg  ? Fibromyalgia   ? Hemorrhoid   ? High cholesterol   ? History of rheumatoid arthritis   ? Hypothyroidism   ? Neuropathy   ? Obesity, unspecified   ? Obstructive lung disease (generalized) ([place])   ? Osteoarthritis of both hands 07/24/2016  ? Osteopenia   ? Osteoporosis 07/24/2016  ? Palpitations   ? Pseudogout involving multiple joints   ? Sleep apnea   ? SVT (supraventricular tachycardia) ([place])   ? Syncope   ? admitted 09/2011  ? Thyroid disease   ? Unspecified essential hypertension   ?  ?Family History  ?Problem Relation Age of Onset  ? Heart disease Mother   ? Hypertension Mother   ? Stroke Other   ? Heart disease Father   ? Hypertension Father   ? Kidney failure Sister   ? Asthma Sister   ? ?Past Surgical History:  ?Procedure Laterality Date  ? CARPAL TUNNEL RELEASE Bilateral   ? CATARACT EXTRACTION, BILATERAL    ? 11/02/2021, 11/23/2021  ? CATHETER ABLATION  02/01/1997  ? @ [facility]  ? COLONOSCOPY WITH PROPOFOL N/A 08/18/2021  ? multiple adenomas. non-bleeding internal hemorrhoids.  ? INCONTINENCE SURGERY    ? POLYPECTOMY  08/18/2021  ? Procedure: POLYPECTOMY;  Surgeon: Montez Morita, Quillian Quince, MD;  Location: [facility];  Service: Gastroenterology;;  ? removal toenail Bilateral   ? great toes  ? VEIN SURGERY BLADDER    ? ?Social History  ? ?Social History Narrative  ? Not on file  ? ?Immunization History  ?Administered Date(s) Administered  ? Moderna Sars-Covid-2 Vaccination 10/08/2019, 11/06/2019  ?  ? ?Objective: ?Vital Signs: BP 130/84 (BP Location: Right Arm, Patient Position: Sitting, Cuff Size: Normal)   Pulse (!) 57   Ht '5\' 1"'$  (1.549 m)   Wt 191 lb 12.8 oz (87 kg)   BMI 36.24 kg/m?   ? ?Physical Exam ?Vitals and nursing note reviewed.  ?Constitutional:   ?   Appearance: She is well-developed.  ?HENT:  ?   Head: Normocephalic and atraumatic.  ?Eyes:   ?   Conjunctiva/sclera: Conjunctivae normal.  ?Cardiovascular:  ?   Rate and Rhythm: Normal rate and regular rhythm.  ?   Heart sounds: Normal heart sounds.  ?Pulmonary:  ?   Effort: Pulmonary effort is normal.  ?   Breath sounds: Normal breath sounds.  ?Abdominal:  ?   General: Bowel sounds are normal.  ?   Palpations: Abdomen is soft.  ?Musculoskeletal:  ?   Cervical back: Normal range of motion.  ?Lymphadenopathy:  ?   Cervical: No cervical adenopathy.  ?Skin: ?   General: Skin is warm and dry.  ?   Capillary Refill: Capillary refill takes less than 2 seconds.  ?Neurological:  ?   Mental Status: She is alert and oriented to person, place, and time.  ?Psychiatric:     ?   Behavior: Behavior normal.  ?  ? ?Musculoskeletal Exam: She had limited range of motion of cervical and lumbar spine.  She had discomfort range  of motion of her left shoulder joint.  No warmth swelling or effusion was noted.  Elbow joints were in good range of motion.  She had no synovitis over MCPs or PIPs or DIPs.  Hip joints in good range of motion.  She has warmth on palpation of the left knee joint with painful range of motion.  Right knee joint was in good range of motion.  There was no tenderness over ankles or MTPs. ? ?CDAI Exam: ?CDAI Score: -- ?Patient Global: --; Provider Global: -- ?Swollen: --; Tender: -- ?Joint Exam 12/20/2021  ? ?No joint exam has been documented for this visit  ? ?There is currently no information documented on the homunculus. Go to the Rheumatology activity and complete the homunculus joint exam. ? ?Investigation: ?No additional findings. ? ?Imaging: ?No results found. ? ?Recent Labs: ?Lab Results  ?Component Value Date  ? WBC 7.0 06/26/2018  ? HGB 12.0 06/26/2018  ? PLT 317 06/26/2018  ? NA 141 08/16/2021  ? K 4.0 08/16/2021  ? CL 102 08/16/2021  ? CO2 31 08/16/2021  ? GLUCOSE 94 08/16/2021  ? BUN 10 08/16/2021  ? CREATININE 0.69 08/16/2021  ? BILITOT 0.9 06/26/2018  ? AST 18 06/26/2018  ? ALT 18 06/26/2018  ?  PROT 6.7 06/26/2018  ? CALCIUM 8.5 (L) 08/16/2021  ? GFRAA >60 08/12/2018  ? ? ?Speciality Comments: No specialty comments available. ? ?Procedures:  ?Large Joint Inj: L knee on 12/20/2021 11:37 AM ?Indicatio

## 2021-12-12 DIAGNOSIS — I10 Essential (primary) hypertension: Secondary | ICD-10-CM | POA: Diagnosis not present

## 2021-12-12 DIAGNOSIS — M545 Low back pain, unspecified: Secondary | ICD-10-CM | POA: Diagnosis not present

## 2021-12-12 DIAGNOSIS — Z0000 Encounter for general adult medical examination without abnormal findings: Secondary | ICD-10-CM | POA: Diagnosis not present

## 2021-12-12 DIAGNOSIS — G4739 Other sleep apnea: Secondary | ICD-10-CM | POA: Diagnosis not present

## 2021-12-12 DIAGNOSIS — E7849 Other hyperlipidemia: Secondary | ICD-10-CM | POA: Diagnosis not present

## 2021-12-12 DIAGNOSIS — M818 Other osteoporosis without current pathological fracture: Secondary | ICD-10-CM | POA: Diagnosis not present

## 2021-12-12 DIAGNOSIS — Z7901 Long term (current) use of anticoagulants: Secondary | ICD-10-CM | POA: Diagnosis not present

## 2021-12-12 DIAGNOSIS — J0191 Acute recurrent sinusitis, unspecified: Secondary | ICD-10-CM | POA: Diagnosis not present

## 2021-12-12 DIAGNOSIS — E039 Hypothyroidism, unspecified: Secondary | ICD-10-CM | POA: Diagnosis not present

## 2021-12-19 ENCOUNTER — Encounter: Payer: Self-pay | Admitting: Gastroenterology

## 2021-12-19 ENCOUNTER — Ambulatory Visit (INDEPENDENT_AMBULATORY_CARE_PROVIDER_SITE_OTHER): Payer: Medicare Other | Admitting: Gastroenterology

## 2021-12-19 VITALS — BP 128/72 | HR 59 | Temp 97.5°F | Ht 61.0 in | Wt 191.8 lb

## 2021-12-19 DIAGNOSIS — K640 First degree hemorrhoids: Secondary | ICD-10-CM | POA: Insufficient documentation

## 2021-12-19 NOTE — Patient Instructions (Signed)
The risks of banding is more than the benefits at this point. ? ?I recommend adding Benefiber 2 teaspoons to your beverage of choice daily.  ? ?Make sure not to sit on the toilet for more than 2-3 minutes at a time! ? ?You can use the hydrocortisone cream as needed for itching/burning. ? ?If you have persistent bleeding, tissue that prolapses, or worsening symptoms, please let me know! ? ?It was a pleasure to see you today. I want to create trusting relationships with patients to provide genuine, compassionate, and quality care. I value your feedback. If you receive a survey regarding your visit,  I greatly appreciate you taking time to fill this out.  ? ?Annitta Needs, PhD, ANP-BC ?[facility]  ? ?

## 2021-12-19 NOTE — Progress Notes (Signed)
? ? ? ? ? ?Gastroenterology Office Note   ? ?Referring Provider: Dr. Jenetta Downer ?Primary Care Physician:  Neale Burly, MD  ?Primary GI: Dr. Jenetta Downer  ? ? ?Chief Complaint  ? ?Chief Complaint  ?Patient presents with  ? Hemorrhoids  ?  Banding  ? ? ? ?History of Present Illness  ? ?Dorothy Ford is a 71 y.o. female presenting today at the request of Dr. Jenetta Downer for consideration of hemorrhoid banding. ? ?She has had a recent colonoscopy as of Dec 2022 with multiple tubular adenomas and small internal hemorrhoids.  ? ?She notes last episode of bleeding several months ago. No bleeding recently. Predominant symptoms of occasional itching and burning. Spends about 15 minutes on toilet.  ? ?She enjoys fruit cups, which she states "runs her off" with diarrhea. This is when she notices hemorrhoid symptoms the most. Otherwise, she does try to get fiber in her diet. Enjoys veggies.  ? ?No rectal pain. No prolapsing tissue.  ? ?She is on warfarin with history of afib and prior PE per patient.  ? ? ?Past Medical History:  ?Diagnosis Date  ? Acid reflux   ? Asthma   ? Cardiac dysrhythmia, unspecified   ? Carotid bruit   ? Bilateral  ? Cataract   ? Chest pain, unspecified   ? Chronic airway obstruction, not elsewhere classified   ? Chronic sinusitis   ? Congestive heart failure, unspecified   ? DDD (degenerative disc disease), cervical 07/24/2016  ? DDD (degenerative disc disease), lumbar 07/24/2016  ? Depressive disorder, not elsewhere classified   ? Diaphragmatic hernia without mention of obstruction or gangrene   ? Edema   ? left leg  ? Fibromyalgia   ? High cholesterol   ? History of rheumatoid arthritis   ? Hypothyroidism   ? Neuropathy   ? Obesity, unspecified   ? Obstructive lung disease (generalized) ([place])   ? Osteoarthritis of both hands 07/24/2016  ? Osteopenia   ? Osteoporosis 07/24/2016  ? Palpitations   ? Pseudogout involving multiple joints   ? Sleep apnea   ? SVT (supraventricular tachycardia) ([place])   ?  Syncope   ? admitted 09/2011  ? Thyroid disease   ? Unspecified essential hypertension   ? ? ?Past Surgical History:  ?Procedure Laterality Date  ? CARPAL TUNNEL RELEASE Bilateral   ? CATHETER ABLATION  02/01/1997  ? @ [facility]  ? COLONOSCOPY WITH PROPOFOL N/A 08/18/2021  ? multiple adenomas. non-bleeding internal hemorrhoids.  ? INCONTINENCE SURGERY    ? POLYPECTOMY  08/18/2021  ? Procedure: POLYPECTOMY;  Surgeon: Montez Morita, Quillian Quince, MD;  Location: [facility];  Service: Gastroenterology;;  ? removal toenail Bilateral   ? great toes  ? VEIN SURGERY BLADDER    ? ? ?Current Outpatient Medications  ?Medication Sig Dispense Refill  ? albuterol (PROVENTIL HFA;VENTOLIN HFA) 108 (90 Base) MCG/ACT inhaler Inhale 2 puffs into the lungs every 6 (six) hours as needed for wheezing or shortness of breath.    ? alendronate (FOSAMAX) 70 MG tablet Take 70 mg by mouth every Friday. Take with a full glass of water on an empty stomach.    ? amLODipine (NORVASC) 5 MG tablet Take 5 mg by mouth daily.    ? ascorbic acid (VITAMIN C) 500 MG tablet Take 500 mg by mouth daily.    ? atorvastatin (LIPITOR) 40 MG tablet Take 40 mg by mouth daily.    ? Calcium Carb-Cholecalciferol (CALCIUM 600 + D PO) Take 1 tablet by  mouth daily.    ? clonazePAM (KLONOPIN) 0.5 MG tablet Take 0.5 mg by mouth at bedtime.     ? COLCRYS 0.6 MG tablet TAKE 1 TABLET BY MOUTH ONCE DAILY. 30 tablet 0  ? cycloSPORINE (RESTASIS) 0.05 % ophthalmic emulsion Place 1 drop into both eyes 2 (two) times daily.    ? Elastic Bandages & Supports (MEDICAL COMPRESSION STOCKINGS) MISC 1 each by Does not apply route as directed. 1 pair low pressure knee high compression stockings Dx: leg edema 1 each 0  ? famotidine (PEPCID) 20 MG tablet Take 20 mg by mouth 2 (two) times daily.    ? fexofenadine (ALLEGRA) 180 MG tablet Take 180 mg by mouth daily.    ? furosemide (LASIX) 40 MG tablet TAKE 1 TABLET BY MOUTH ONCE DAILY. 30 tablet 6  ? gabapentin (NEURONTIN) 300 MG capsule Take  300 mg by mouth 3 (three) times daily.     ? ipratropium-albuterol (DUONEB) 0.5-2.5 (3) MG/3ML SOLN Take 3 mLs by nebulization every 6 (six) hours as needed.     ? levalbuterol (XOPENEX HFA) 45 MCG/ACT inhaler Inhale 2 puffs into the lungs every 6 (six) hours as needed for wheezing.    ? levothyroxine (SYNTHROID, LEVOTHROID) 50 MCG tablet Take 50 mcg by mouth daily before breakfast.     ? metoprolol tartrate (LOPRESSOR) 25 MG tablet Take 25 mg by mouth daily.    ? montelukast (SINGULAIR) 10 MG tablet Take 1 tablet by mouth daily.    ? mupirocin ointment (BACTROBAN) 2 % Apply 1 application. topically 3 (three) times daily.    ? naloxone (NARCAN) 4 MG/0.1ML LIQD nasal spray kit Narcan 4 mg/actuation nasal spray    ? ofloxacin (OCUFLOX) 0.3 % ophthalmic solution     ? ondansetron (ZOFRAN) 4 MG tablet as needed.     ? oxyCODONE (OXY IR/ROXICODONE) 5 MG immediate release tablet as needed.     ? PARoxetine (PAXIL) 30 MG tablet Take 30 mg by mouth daily.    ? potassium chloride (K-DUR) 10 MEQ tablet Take 3 tablets (30 mEq total) by mouth daily. 90 tablet 6  ? prednisoLONE acetate (PRED FORTE) 1 % ophthalmic suspension SMARTSIG:In Eye(s)    ? Sennosides (SENOKOT PO) Take by mouth in the morning and at bedtime.     ? Simethicone 125 MG CAPS Take 2 capsules by mouth as needed.    ? SYMBICORT 160-4.5 MCG/ACT inhaler Inhale 2 puffs into the lungs 2 (two) times daily.    ? warfarin (COUMADIN) 2 MG tablet Take 2 mg by mouth as directed. Managed by PCP    ? azelastine (ASTELIN) 0.1 % nasal spray Place 1 spray into both nostrils daily. (Patient not taking: Reported on 12/19/2021)    ? guaiFENesin (MUCINEX) 600 MG 12 hr tablet Take 600 mg by mouth 2 (two) times daily as needed for to loosen phlegm. (Patient not taking: Reported on 12/19/2021)    ? hydrocortisone (ANUSOL-HC) 2.5 % rectal cream Place 1 application rectally 2 (two) times daily. Twice daily x 10 days then up to twice daily as needed for itching/irritation from hemorrhoids  (Patient not taking: Reported on 12/19/2021) 30 g 1  ? loperamide (IMODIUM A-D) 2 MG tablet Take 2 mg by mouth 4 (four) times daily as needed for diarrhea or loose stools. (Patient not taking: Reported on 12/19/2021)    ? nystatin (MYCOSTATIN) powder Apply topically as needed. (Patient not taking: Reported on 12/19/2021)    ? pantoprazole (PROTONIX) 40 MG tablet Take  40 mg by mouth daily. (Patient not taking: Reported on 12/19/2021)    ? ?No current facility-administered medications for this visit.  ? ? ?Allergies as of 12/19/2021 - Review Complete 12/19/2021  ?Allergen Reaction Noted  ? Nitroglycerin  05/16/2021  ? Other  08/14/2021  ? Adhesive [tape] Rash 10/09/2011  ? Codeine Nausea And Vomiting   ? Latex Rash 05/16/2021  ? ? ?Family History  ?Problem Relation Age of Onset  ? Heart disease Mother   ? Hypertension Mother   ? Stroke Other   ? Heart disease Father   ? Hypertension Father   ? Kidney failure Sister   ? Asthma Sister   ? ? ?Social History  ? ?Socioeconomic History  ? Marital status: Single  ?  Spouse name: Not on file  ? Number of children: Not on file  ? Years of education: Not on file  ? Highest education level: Not on file  ?Occupational History  ? Not on file  ?Tobacco Use  ? Smoking status: Never  ? Smokeless tobacco: Never  ?Vaping Use  ? Vaping Use: Never used  ?Substance and Sexual Activity  ? Alcohol use: No  ?  Alcohol/week: 0.0 standard drinks  ? Drug use: Never  ? Sexual activity: Not Currently  ?Other Topics Concern  ? Not on file  ?Social History Narrative  ? Not on file  ? ?Social Determinants of Health  ? ?Financial Resource Strain: Not on file  ?Food Insecurity: Not on file  ?Transportation Needs: Not on file  ?Physical Activity: Not on file  ?Stress: Not on file  ?Social Connections: Not on file  ?Intimate Partner Violence: Not on file  ? ? ? ?Review of Systems  ? ?See HPI ? ? ?Physical Exam  ? ?BP 128/72 (BP Location: Left Arm, Patient Position: Sitting, Cuff Size: Large)   Pulse (!) 59    Temp (!) 97.5 ?F (36.4 ?C) (Temporal)   Ht 5' 1"  (1.549 m)   Wt 191 lb 12.8 oz (87 kg)   SpO2 98%   BMI 36.24 kg/m?  ?General:   Alert and oriented. Pleasant and cooperative. Well-nourished and well-d

## 2021-12-20 ENCOUNTER — Ambulatory Visit (INDEPENDENT_AMBULATORY_CARE_PROVIDER_SITE_OTHER): Payer: Medicare Other | Admitting: Rheumatology

## 2021-12-20 ENCOUNTER — Encounter: Payer: Self-pay | Admitting: Rheumatology

## 2021-12-20 VITALS — BP 130/84 | HR 57 | Ht 61.0 in | Wt 191.8 lb

## 2021-12-20 DIAGNOSIS — M170 Bilateral primary osteoarthritis of knee: Secondary | ICD-10-CM | POA: Diagnosis not present

## 2021-12-20 DIAGNOSIS — M1120 Other chondrocalcinosis, unspecified site: Secondary | ICD-10-CM

## 2021-12-20 DIAGNOSIS — Z7901 Long term (current) use of anticoagulants: Secondary | ICD-10-CM

## 2021-12-20 DIAGNOSIS — M25512 Pain in left shoulder: Secondary | ICD-10-CM

## 2021-12-20 DIAGNOSIS — M19041 Primary osteoarthritis, right hand: Secondary | ICD-10-CM

## 2021-12-20 DIAGNOSIS — M5136 Other intervertebral disc degeneration, lumbar region: Secondary | ICD-10-CM | POA: Diagnosis not present

## 2021-12-20 DIAGNOSIS — G8929 Other chronic pain: Secondary | ICD-10-CM

## 2021-12-20 DIAGNOSIS — M25562 Pain in left knee: Secondary | ICD-10-CM | POA: Diagnosis not present

## 2021-12-20 DIAGNOSIS — M51369 Other intervertebral disc degeneration, lumbar region without mention of lumbar back pain or lower extremity pain: Secondary | ICD-10-CM

## 2021-12-20 DIAGNOSIS — M19042 Primary osteoarthritis, left hand: Secondary | ICD-10-CM

## 2021-12-20 DIAGNOSIS — Z86711 Personal history of pulmonary embolism: Secondary | ICD-10-CM

## 2021-12-20 DIAGNOSIS — M797 Fibromyalgia: Secondary | ICD-10-CM | POA: Diagnosis not present

## 2021-12-20 DIAGNOSIS — M5030 Other cervical disc degeneration, unspecified cervical region: Secondary | ICD-10-CM

## 2021-12-20 DIAGNOSIS — M8589 Other specified disorders of bone density and structure, multiple sites: Secondary | ICD-10-CM | POA: Diagnosis not present

## 2021-12-20 DIAGNOSIS — Z8709 Personal history of other diseases of the respiratory system: Secondary | ICD-10-CM | POA: Diagnosis not present

## 2021-12-20 DIAGNOSIS — M810 Age-related osteoporosis without current pathological fracture: Secondary | ICD-10-CM

## 2021-12-20 DIAGNOSIS — Z8679 Personal history of other diseases of the circulatory system: Secondary | ICD-10-CM

## 2021-12-20 DIAGNOSIS — Z8669 Personal history of other diseases of the nervous system and sense organs: Secondary | ICD-10-CM

## 2021-12-20 MED ORDER — LIDOCAINE HCL 1 % IJ SOLN
1.5000 mL | INTRAMUSCULAR | Status: AC | PRN
  Administered 2021-12-20: 1.5 mL

## 2021-12-20 MED ORDER — TRIAMCINOLONE ACETONIDE 40 MG/ML IJ SUSP
40.0000 mg | INTRAMUSCULAR | Status: AC | PRN
  Administered 2021-12-20: 40 mg via INTRA_ARTICULAR

## 2021-12-20 NOTE — Patient Instructions (Signed)
Please request repeat bone density through Dr. Telford Nab ? ?Please forward your recent lab results to [place]. ? ? ?Knee Exercises ?Ask your health care provider which exercises are safe for you. Do exercises exactly as told by your health care provider and adjust them as directed. It is normal to feel mild stretching, pulling, tightness, or discomfort as you do these exercises. Stop right away if you feel sudden pain or your pain gets worse. Do not begin these exercises until told by your health care provider. ?Stretching and range-of-motion exercises ?These exercises warm up your muscles and joints and improve the movement and flexibility of your knee. These exercises also help to relieve pain and swelling. ?Knee extension, prone ? ?Lie on your abdomen (prone position) on a bed. ?Place your left / right knee just beyond the edge of the surface so your knee is not on the bed. You can put a towel under your left / right thigh just above your kneecap for comfort. ?Relax your leg muscles and allow gravity to straighten your knee (extension). You should feel a stretch behind your left / right knee. ?Hold this position for __________ seconds. ?Scoot up so your knee is supported between repetitions. ?Repeat __________ times. Complete this exercise __________ times a day. ?Knee flexion, active ? ?Lie on your back with both legs straight. If this causes back discomfort, bend your left / right knee so your foot is flat on the floor. ?Slowly slide your left / right heel back toward your buttocks. Stop when you feel a gentle stretch in the front of your knee or thigh (flexion). ?Hold this position for __________ seconds. ?Slowly slide your left / right heel back to the starting position. ?Repeat __________ times. Complete this exercise __________ times a day. ?Quadriceps stretch, prone ? ?Lie on your abdomen on a firm surface, such as a bed or padded floor. ?Bend your left / right knee and hold your ankle. If you cannot reach your  ankle or pant leg, loop a belt around your foot and grab the belt instead. ?Gently pull your heel toward your buttocks. Your knee should not slide out to the side. You should feel a stretch in the front of your thigh and knee (quadriceps). ?Hold this position for __________ seconds. ?Repeat __________ times. Complete this exercise __________ times a day. ?Hamstring, supine ? ?Lie on your back (supine position). ?Loop a belt or towel over the ball of your left / right foot. The ball of your foot is on the walking surface, right under your toes. ?Straighten your left / right knee and slowly pull on the belt to raise your leg until you feel a gentle stretch behind your knee (hamstring). ?Do not let your knee bend while you do this. ?Keep your other leg flat on the floor. ?Hold this position for __________ seconds. ?Repeat __________ times. Complete this exercise __________ times a day. ?Strengthening exercises ?These exercises build strength and endurance in your knee. Endurance is the ability to use your muscles for a long time, even after they get tired. ?Quadriceps, isometric ?This exercise strengthens the muscles in front of your thigh (quadriceps) without moving your knee joint (isometric). ?Lie on your back with your left / right leg extended and your other knee bent. Put a rolled towel or small pillow under your knee if told by your health care provider. ?Slowly tense the muscles in the front of your left / right thigh. You should see your kneecap slide up toward your hip or  see increased dimpling just above the knee. This motion will push the back of the knee toward the floor. ?For __________ seconds, hold the muscle as tight as you can without increasing your pain. ?Relax the muscles slowly and completely. ?Repeat __________ times. Complete this exercise __________ times a day. ?Straight leg raises ?This exercise strengthens the muscles in front of your thigh (quadriceps) and the muscles that move your hips  (hip flexors). ?Lie on your back with your left / right leg extended and your other knee bent. ?Tense the muscles in the front of your left / right thigh. You should see your kneecap slide up or see increased dimpling just above the knee. Your thigh may even shake a bit. ?Keep these muscles tight as you raise your leg 4-6 inches (10-15 cm) off the floor. Do not let your knee bend. ?Hold this position for __________ seconds. ?Keep these muscles tense as you lower your leg. ?Relax your muscles slowly and completely after each repetition. ?Repeat __________ times. Complete this exercise __________ times a day. ?Hamstring, isometric ? ?Lie on your back on a firm surface. ?Bend your left / right knee about __________ degrees. ?Dig your left / right heel into the surface as if you are trying to pull it toward your buttocks. Tighten the muscles in the back of your thighs (hamstring) to "dig" as hard as you can without increasing any pain. ?Hold this position for __________ seconds. ?Release the tension gradually and allow your muscles to relax completely for __________ seconds after each repetition. ?Repeat __________ times. Complete this exercise __________ times a day. ?Hamstring curls ?If told by your health care provider, do this exercise while wearing ankle weights. Begin with __________lb / kg weights. Then increase the weight by 1 lb (0.5 kg) increments. Do not wear ankle weights that are more than __________lb / kg. ?Lie on your abdomen with your legs straight. ?Bend your left / right knee as far as you can without feeling pain. Keep your hips flat against the floor. ?Hold this position for __________ seconds. ?Slowly lower your leg to the starting position. ?Repeat __________ times. Complete this exercise __________ times a day. ?Squats ?This exercise strengthens the muscles in front of your thigh and knee (quadriceps). ?Stand in front of a table, with your feet and knees pointing straight ahead. You may rest your  hands on the table for balance but not for support. ?Slowly bend your knees and lower your hips like you are going to sit in a chair. ?Keep your weight over your heels, not over your toes. ?Keep your lower legs upright so they are parallel with the table legs. ?Do not let your hips go lower than your knees. ?Do not bend lower than told by your health care provider. ?If your knee pain increases, do not bend as low. ?Hold the squat position for __________ seconds. ?Slowly push with your legs to return to standing. Do not use your hands to pull yourself to standing. ?Repeat __________ times. Complete this exercise __________ times a day. ?Wall slides ?This exercise strengthens the muscles in front of your thigh and knee (quadriceps). ?Lean your back against a smooth wall or door, and walk your feet out 18-24 inches (46-61 cm) from it. ?Place your feet hip-width apart. ?Slowly slide down the wall or door until your knees bend __________ degrees. Keep your knees over your heels, not over your toes. Keep your knees in line with your hips. ?Hold this position for __________ seconds. ?Repeat __________  times. Complete this exercise __________ times a day. ?Straight leg raises, side-lying ?This exercise strengthens the muscles that rotate the leg at the hip and move it away from your body (hip abductors). ?Lie on your side with your left / right leg in the top position. Lie so your head, shoulder, knee, and hip line up. You may bend your bottom knee to help you keep your balance. ?Roll your hips slightly forward so your hips are stacked directly over each other and your left / right knee is facing forward. ?Leading with your heel, lift your top leg 4-6 inches (10-15 cm). You should feel the muscles in your outer hip lifting. ?Do not let your foot drift forward. ?Do not let your knee roll toward the ceiling. ?Hold this position for __________ seconds. ?Slowly return your leg to the starting position. ?Let your muscles relax  completely after each repetition. ?Repeat __________ times. Complete this exercise __________ times a day. ?Straight leg raises, prone ?This exercise stretches the muscles that move your hips away from th

## 2021-12-25 ENCOUNTER — Telehealth: Payer: Self-pay | Admitting: *Deleted

## 2021-12-25 NOTE — Telephone Encounter (Signed)
Patient is due for an update bone density scan. Attempted to contact patient to advise. Unable to leave a message, no answer and no voicemail.  ?

## 2022-01-01 DIAGNOSIS — U071 COVID-19: Secondary | ICD-10-CM | POA: Diagnosis not present

## 2022-01-01 DIAGNOSIS — R0902 Hypoxemia: Secondary | ICD-10-CM | POA: Diagnosis not present

## 2022-01-01 DIAGNOSIS — R509 Fever, unspecified: Secondary | ICD-10-CM | POA: Diagnosis not present

## 2022-01-01 DIAGNOSIS — I10 Essential (primary) hypertension: Secondary | ICD-10-CM | POA: Diagnosis not present

## 2022-01-05 DIAGNOSIS — Z7901 Long term (current) use of anticoagulants: Secondary | ICD-10-CM | POA: Diagnosis not present

## 2022-01-18 DIAGNOSIS — J4540 Moderate persistent asthma, uncomplicated: Secondary | ICD-10-CM | POA: Diagnosis not present

## 2022-01-23 DIAGNOSIS — M25571 Pain in right ankle and joints of right foot: Secondary | ICD-10-CM | POA: Diagnosis not present

## 2022-01-23 DIAGNOSIS — M5416 Radiculopathy, lumbar region: Secondary | ICD-10-CM | POA: Diagnosis not present

## 2022-01-23 DIAGNOSIS — M5010 Cervical disc disorder with radiculopathy, unspecified cervical region: Secondary | ICD-10-CM | POA: Diagnosis not present

## 2022-01-23 DIAGNOSIS — M79622 Pain in left upper arm: Secondary | ICD-10-CM | POA: Diagnosis not present

## 2022-01-23 DIAGNOSIS — M25562 Pain in left knee: Secondary | ICD-10-CM | POA: Diagnosis not present

## 2022-01-23 DIAGNOSIS — M545 Low back pain, unspecified: Secondary | ICD-10-CM | POA: Diagnosis not present

## 2022-02-01 DIAGNOSIS — R0902 Hypoxemia: Secondary | ICD-10-CM | POA: Diagnosis not present

## 2022-02-01 DIAGNOSIS — R509 Fever, unspecified: Secondary | ICD-10-CM | POA: Diagnosis not present

## 2022-02-01 DIAGNOSIS — I10 Essential (primary) hypertension: Secondary | ICD-10-CM | POA: Diagnosis not present

## 2022-02-01 DIAGNOSIS — U071 COVID-19: Secondary | ICD-10-CM | POA: Diagnosis not present

## 2022-02-02 DIAGNOSIS — Z7901 Long term (current) use of anticoagulants: Secondary | ICD-10-CM | POA: Diagnosis not present

## 2022-02-06 ENCOUNTER — Encounter (INDEPENDENT_AMBULATORY_CARE_PROVIDER_SITE_OTHER): Payer: Self-pay | Admitting: Gastroenterology

## 2022-02-06 ENCOUNTER — Ambulatory Visit (INDEPENDENT_AMBULATORY_CARE_PROVIDER_SITE_OTHER): Payer: Medicare Other | Admitting: Gastroenterology

## 2022-02-21 ENCOUNTER — Telehealth: Payer: Self-pay | Admitting: *Deleted

## 2022-02-21 NOTE — Telephone Encounter (Signed)
Patient called and states she was having bleeding from hemorrhoids yesterday. Saw some blood on tissue after wiping yesterday one time. She was seen 08/10/21 by Vikki Ports for rectal bleeding and saw Vicente Males for possible banding but patient states she decided to  hold off since she was on coumadin for a fib. She states she is not taking benefiber any more because she states her bowels are moving good. States she has 2-3 stools per week and she uses hydrocortisone as needed on her hemorrhoids.   [phone]

## 2022-02-22 NOTE — Telephone Encounter (Signed)
Called and discussed with patient per [place], she had a colonoscopy in 2022 with a few polyps and presence of hemorrhoids, she needs to make sure that stools are very soft and she is not straining or sitting on toilet for long periods of time. She can use the anusol/hydrocortisone cream up to 4 times per day x10 days if hemorrhoids are flaring up, then as needed after that. If bleeding worsens or if she has any other symptoms, she should let us know as we may need to see her in the office.   Patient verbalized understanding of all

## 2022-03-01 ENCOUNTER — Ambulatory Visit (INDEPENDENT_AMBULATORY_CARE_PROVIDER_SITE_OTHER): Payer: Medicare Other | Admitting: Gastroenterology

## 2022-03-02 DIAGNOSIS — Z7901 Long term (current) use of anticoagulants: Secondary | ICD-10-CM | POA: Diagnosis not present

## 2022-03-16 DIAGNOSIS — M79622 Pain in left upper arm: Secondary | ICD-10-CM | POA: Diagnosis not present

## 2022-03-16 DIAGNOSIS — M25571 Pain in right ankle and joints of right foot: Secondary | ICD-10-CM | POA: Diagnosis not present

## 2022-03-16 DIAGNOSIS — M25562 Pain in left knee: Secondary | ICD-10-CM | POA: Diagnosis not present

## 2022-03-16 DIAGNOSIS — Z79891 Long term (current) use of opiate analgesic: Secondary | ICD-10-CM | POA: Diagnosis not present

## 2022-03-16 DIAGNOSIS — M5010 Cervical disc disorder with radiculopathy, unspecified cervical region: Secondary | ICD-10-CM | POA: Diagnosis not present

## 2022-03-16 DIAGNOSIS — G4733 Obstructive sleep apnea (adult) (pediatric): Secondary | ICD-10-CM | POA: Diagnosis not present

## 2022-03-16 DIAGNOSIS — M5416 Radiculopathy, lumbar region: Secondary | ICD-10-CM | POA: Diagnosis not present

## 2022-03-16 DIAGNOSIS — M545 Low back pain, unspecified: Secondary | ICD-10-CM | POA: Diagnosis not present

## 2022-03-16 DIAGNOSIS — M25512 Pain in left shoulder: Secondary | ICD-10-CM | POA: Diagnosis not present

## 2022-03-19 ENCOUNTER — Ambulatory Visit (INDEPENDENT_AMBULATORY_CARE_PROVIDER_SITE_OTHER): Payer: Medicare Other | Admitting: Gastroenterology

## 2022-03-19 ENCOUNTER — Encounter (INDEPENDENT_AMBULATORY_CARE_PROVIDER_SITE_OTHER): Payer: Self-pay | Admitting: Gastroenterology

## 2022-03-19 VITALS — BP 121/79 | HR 69 | Temp 97.5°F | Ht 61.0 in | Wt 191.8 lb

## 2022-03-19 DIAGNOSIS — K625 Hemorrhage of anus and rectum: Secondary | ICD-10-CM | POA: Diagnosis not present

## 2022-03-19 DIAGNOSIS — R152 Fecal urgency: Secondary | ICD-10-CM | POA: Diagnosis not present

## 2022-03-19 DIAGNOSIS — R140 Abdominal distension (gaseous): Secondary | ICD-10-CM | POA: Diagnosis not present

## 2022-03-19 DIAGNOSIS — K640 First degree hemorrhoids: Secondary | ICD-10-CM | POA: Diagnosis not present

## 2022-03-19 MED ORDER — DICYCLOMINE HCL 10 MG PO CAPS: 10.0000 mg | ORAL_CAPSULE | Freq: Three times a day (TID) | ORAL | 0 refills | Status: DC | PRN

## 2022-03-19 MED ORDER — PANTOPRAZOLE SODIUM 40 MG PO TBEC
40.0000 mg | DELAYED_RELEASE_TABLET | Freq: Every day | ORAL | 1 refills | Status: DC
Start: 2022-03-19 — End: 2023-01-16

## 2022-03-19 NOTE — Progress Notes (Signed)
Referring Provider: Neale Burly, MD Primary Care Physician:  Neale Burly, MD Primary GI Physician: Jenetta Downer  Chief Complaint  Patient presents with   Rectal Bleeding    Follow up visit. Saw some rectal bleeding on June 20th and used hemorrhoid cream for 7 days and states it is better. Has noticed stools twice this week when wiping was yellow.    HPI:   Dorothy Ford is a 71 y.o. female with past medical history of  asthma, DDD, CHF, depression, diaphragmatic hernia, fibromyalgia, high cholesterol, RA, neuropathy, OA, thyroid disease.  Patient presenting today for follow up of rectal bleeding.  Last seen 08/10/22, at that time presenting for rectal bleeding, noticing a streak of blood on toilet tissue in September with a little more blood on toilet tissue thereafter. Denied blood in stool or toilet. Also with rectal itching and burning, using hemorrhoid wipes with some relief. Stools loose to watery, 1-2 BMs per day. Advised to schedule colonoscopy, Rx anusol BID, start probiotic daily to help with loose stools and consideration for stool studies if diarrhea worsened. Colonoscopy as outlined below, referred for hemorrhoid banding which she completed first round of on 12/19/21 with banding of left lateral hemorrhoid (grade 1)  Today, she states that she had 2 BMs this past week with brown stool but some yellow stool noted on the toilet tissue. She is having a BM only a few times per week. Feels that she is having some times where her stomach feels little upset with some bloating and then she will have some nausea afterwards, noticing that bowels are moving more in the early morning around 3am. She is not having any diarrhea, stools are more soft. She is having some fecal urgency and some accidents as she is not making it to the restroom in time. Denies episodes of constipation. States that these symptoms have been ongoing for a while. Denies changes to her diet or new medications. She  denies any RUQ pain or pain between shoulder blades.   She denies any rectal bleeding. She did have one episode of rectal bleeding on June 20th with blood noted on the toilet tissue, this occurred twice, she used her hemorrhoid cream with resolution of bleeding.   She does have history of GERD, takes famotidine 22m, 2 tablets prior to dinner. She states at times she feels like she needs to take more but has not. She is having heartburn almost daily with some acid regurgitation as well. States she is missing some teeth so it makes chewing thoroughly difficult. She denies dysphagia with foods but larger pills sometimes have trouble going down. States that she was previously on protonix 4108mdaily, taken off of it last year when she was being treated for covid.    Last Colonoscopy:Dec 2022Four 3 to 8 mm polyps in the transverse colon and in the ascending colon,  Three 3 to 8 mm polyps in the rectum, in the sigmoid colon and in the descending colon, - Non-bleeding internal hemorrhoids Last Endoscopy:unsure of timing, was r/t abdominal pain   Past Medical History:  Diagnosis Date   Acid reflux    Asthma    Cardiac dysrhythmia, unspecified    Carotid bruit    Bilateral   Cataract    Chest pain, unspecified    Chronic airway obstruction, not elsewhere classified    Chronic sinusitis    Congestive heart failure, unspecified    DDD (degenerative disc disease), cervical 07/24/2016   DDD (degenerative disc disease),  lumbar 07/24/2016   Depressive disorder, not elsewhere classified    Diaphragmatic hernia without mention of obstruction or gangrene    Edema    left leg   Fibromyalgia    Hemorrhoid    High cholesterol    History of rheumatoid arthritis    Hypothyroidism    Neuropathy    Obesity, unspecified    Obstructive lung disease (generalized) ([place])    Osteoarthritis of both hands 07/24/2016   Osteopenia    Osteoporosis 07/24/2016   Palpitations    Pseudogout involving multiple joints     Sleep apnea    SVT (supraventricular tachycardia) ([place])    Syncope    admitted 09/2011   Thyroid disease    Unspecified essential hypertension     Past Surgical History:  Procedure Laterality Date   CARPAL TUNNEL RELEASE Bilateral    CATARACT EXTRACTION, BILATERAL     11/02/2021, 11/23/2021   CATHETER ABLATION  02/01/1997   @ [facility]   COLONOSCOPY WITH PROPOFOL N/A 08/18/2021   multiple adenomas. non-bleeding internal hemorrhoids.   INCONTINENCE SURGERY     POLYPECTOMY  08/18/2021   Procedure: POLYPECTOMY;  Surgeon: Montez Morita, Quillian Quince, MD;  Location: [facility];  Service: Gastroenterology;;   removal toenail Bilateral    great toes   VEIN SURGERY BLADDER      Current Outpatient Medications  Medication Sig Dispense Refill   albuterol (PROVENTIL HFA;VENTOLIN HFA) 108 (90 Base) MCG/ACT inhaler Inhale 2 puffs into the lungs every 6 (six) hours as needed for wheezing or shortness of breath.     alendronate (FOSAMAX) 70 MG tablet Take 70 mg by mouth every Friday. Take with a full glass of water on an empty stomach.     amLODipine (NORVASC) 5 MG tablet Take 5 mg by mouth daily.     ascorbic acid (VITAMIN C) 500 MG tablet Take 500 mg by mouth daily.     atorvastatin (LIPITOR) 40 MG tablet Take 40 mg by mouth daily.     azelastine (ASTELIN) 0.1 % nasal spray Place 1 spray into both nostrils daily.     Calcium Carb-Cholecalciferol (CALCIUM 600 + D PO) Take 1 tablet by mouth daily.     clonazePAM (KLONOPIN) 0.5 MG tablet Take 0.5 mg by mouth at bedtime.      COLCRYS 0.6 MG tablet TAKE 1 TABLET BY MOUTH ONCE DAILY. 30 tablet 0   cycloSPORINE (RESTASIS) 0.05 % ophthalmic emulsion Place 1 drop into both eyes 2 (two) times daily.     Elastic Bandages & Supports (MEDICAL COMPRESSION STOCKINGS) MISC 1 each by Does not apply route as directed. 1 pair low pressure knee high compression stockings Dx: leg edema 1 each 0   famotidine (PEPCID) 20 MG tablet Take 20 mg by mouth 2 (two) times  daily.     fexofenadine (ALLEGRA) 180 MG tablet Take 180 mg by mouth daily.     furosemide (LASIX) 40 MG tablet TAKE 1 TABLET BY MOUTH ONCE DAILY. 30 tablet 6   gabapentin (NEURONTIN) 300 MG capsule Take 300 mg by mouth 3 (three) times daily.      guaiFENesin (MUCINEX) 600 MG 12 hr tablet Take 600 mg by mouth 2 (two) times daily as needed for to loosen phlegm.     hydrocortisone (ANUSOL-HC) 2.5 % rectal cream Place 1 application rectally 2 (two) times daily. Twice daily x 10 days then up to twice daily as needed for itching/irritation from hemorrhoids 30 g 1   ipratropium-albuterol (DUONEB) 0.5-2.5 (3) MG/3ML  SOLN Take 3 mLs by nebulization every 6 (six) hours as needed.      levalbuterol (XOPENEX HFA) 45 MCG/ACT inhaler Inhale 2 puffs into the lungs every 6 (six) hours as needed for wheezing.     levothyroxine (SYNTHROID, LEVOTHROID) 50 MCG tablet Take 50 mcg by mouth daily before breakfast.      loperamide (IMODIUM A-D) 2 MG tablet Take 2 mg by mouth 4 (four) times daily as needed for diarrhea or loose stools.     metoprolol tartrate (LOPRESSOR) 25 MG tablet Take 25 mg by mouth daily.     montelukast (SINGULAIR) 10 MG tablet Take 1 tablet by mouth daily.     mupirocin ointment (BACTROBAN) 2 % Apply 1 application. topically 3 (three) times daily.     naloxone (NARCAN) 4 MG/0.1ML LIQD nasal spray kit Narcan 4 mg/actuation nasal spray     nystatin (MYCOSTATIN) powder Apply topically as needed.     ondansetron (ZOFRAN) 4 MG tablet as needed.      oxyCODONE (OXY IR/ROXICODONE) 5 MG immediate release tablet as needed.      pantoprazole (PROTONIX) 40 MG tablet Take 40 mg by mouth daily.     PARoxetine (PAXIL) 30 MG tablet Take 30 mg by mouth daily.     Sennosides (SENOKOT PO) Take by mouth in the morning and at bedtime.      Simethicone 125 MG CAPS Take 2 capsules by mouth as needed.     SYMBICORT 160-4.5 MCG/ACT inhaler Inhale 2 puffs into the lungs 2 (two) times daily.     warfarin (COUMADIN) 2 MG  tablet Take 2 mg by mouth as directed. Managed by PCP     potassium chloride (K-DUR) 10 MEQ tablet Take 3 tablets (30 mEq total) by mouth daily. 90 tablet 6   No current facility-administered medications for this visit.    Allergies as of 03/19/2022 - Review Complete 03/19/2022  Allergen Reaction Noted   Nitroglycerin  05/16/2021   Other  08/14/2021   Adhesive [tape] Rash 10/09/2011   Codeine Nausea And Vomiting    Latex Rash 05/16/2021    Family History  Problem Relation Age of Onset   Heart disease Mother    Hypertension Mother    Stroke Other    Heart disease Father    Hypertension Father    Kidney failure Sister    Asthma Sister     Social History   Socioeconomic History   Marital status: Single    Spouse name: Not on file   Number of children: Not on file   Years of education: Not on file   Highest education level: Not on file  Occupational History   Not on file  Tobacco Use   Smoking status: Never    Passive exposure: Current   Smokeless tobacco: Never  Vaping Use   Vaping Use: Never used  Substance and Sexual Activity   Alcohol use: No    Alcohol/week: 0.0 standard drinks of alcohol   Drug use: Never   Sexual activity: Not Currently  Other Topics Concern   Not on file  Social History Narrative   Not on file   Social Determinants of Health   Financial Resource Strain: Not on file  Food Insecurity: Not on file  Transportation Needs: Not on file  Physical Activity: Not on file  Stress: Not on file  Social Connections: Not on file   Review of systems General: negative for malaise, night sweats, fever, chills, weight loss Neck: Negative for lumps,  goiter, pain and significant neck swelling Resp: Negative for cough, wheezing, dyspnea at rest CV: Negative for chest pain, leg swelling, palpitations, orthopnea GI: denies melena, hematochezia, nausea, vomiting, diarrhea, constipation, dysphagia, odyonophagia, early satiety or unintentional weight loss.   MSK: Negative for joint pain or swelling, back pain, and muscle pain. Derm: Negative for itching or rash Psych: Denies depression, anxiety, memory loss, confusion. No homicidal or suicidal ideation.  Heme: Negative for prolonged bleeding, bruising easily, and swollen nodes. Endocrine: Negative for cold or heat intolerance, polyuria, polydipsia and goiter. Neuro: negative for tremor, gait imbalance, syncope and seizures. The remainder of the review of systems is noncontributory.  Physical Exam: BP 121/79 (BP Location: Right Arm, Patient Position: Sitting, Cuff Size: Large)   Pulse 69   Temp (!) 97.5 F (36.4 C) (Oral)   Ht 5' 1"  (1.549 m)   Wt 191 lb 12.8 oz (87 kg)   BMI 36.24 kg/m  General:   Alert and oriented. No distress noted. Pleasant and cooperative.  Head:  Normocephalic and atraumatic. Eyes:  Conjuctiva clear without scleral icterus. Mouth:  Oral mucosa pink and moist. Good dentition. No lesions. Heart: Normal rate and rhythm, s1 and s2 heart sounds present.  Lungs: Clear lung sounds in all lobes. Respirations equal and unlabored. Abdomen:  +BS, soft, non-tender and non-distended. No rebound or guarding. No HSM or masses noted. Derm: No palmar erythema or jaundice Msk:  Symmetrical without gross deformities. Normal posture. Extremities:  Without edema. Neurologic:  Alert and  oriented x4 Psych:  Alert and cooperative. Normal mood and affect.  Invalid input(s): "6 MONTHS"   ASSESSMENT: ALMADELIA LOOMAN is a 71 y.o. female presenting today for follow up of rectal bleeding, also with GERD and fecal urgency.  Rectal bleeding secondary to grade 1 hemorrhoids: minimal, has had one episode of toilet tissue hematochezia since hemorrhoid banding in April. Stools are soft, can continue with otc hemorrhoid treatment unless further bleeding or discomfort persists, would refer back to Roseanne Kaufman, NP at [facility] for further hemorrhoid banding in that case.  GERD: on famotidine 62m QHS,  having daily heartburn and some acid reflux, will restart protonix 457mdaily as she was previously on this, unsure if it provided relief. She can let me know if no results in about 4 weeks, consider a different PPI at that time, can continue with famotidine 2090mHS for now. No dysphagia, early satiety, epigastric pain, weight loss or changes in appetite. Should be mindful to Avoid greasy, spicy, fried, citrus foods, caffeine, carbonated drinks, chocolate and alcohol, as well as Stay upright 2-3 hours after eating, prior to lying down and avoid eating late in the evenings.  Fecal urgency/yellow stools: suspect symptoms related to IBS, some abdominal discomfort, looser stools, and bloating usually relieved by having a BM. No diarrhea, vomiting or post prandial abdominal pain. No melena or bloody stools. Recent colonoscopy in December with polyps and hemorrhoids. Will trial bentyl 50m19mD PRN for symptom management. She wll let me know if she develops severe abdominal pain, diarrhea or vomiting. Will obtain recent labs from PCP to evaluate LFTs/Bilirubin.    PLAN:  Continue famotidine 20mg50m  2. Rx protonix 40mg 8my  3. Rx bentyl 50mg B44m4. Obtain labs from Dr. HasanajSherrie Sportflux precautions  All questions were answered, patient verbalized understanding and is in agreement with plan as outlined above.   Follow Up: 6 months  Priyal Musquiz L. Lucile Hillmann,Alver SorrowAPRN, AGNP-C Adult-Gerontology Nurse Practitioner [facility]

## 2022-03-19 NOTE — Patient Instructions (Addendum)
I have sent protonix '40mg'$  to your pharmacy Please take this 30 minutes prior to breakfast Avoid greasy, spicy, fried, citrus foods, and be mindful that caffeine, carbonated drinks, chocolate and alcohol can increase reflux symptoms Stay upright 2-3 hours after eating, prior to lying down and avoid eating late in the evenings. Please continue famotidine in the evenings, take one '20mg'$  tablet.  I am also sending bentyl '10mg'$ , you can take this up to three times a day, before meals and/or at bedtime to help with fecal urgency and abdominal discomfort. Suspect symptoms are related to IBS, however, if you start to have severe abdominal pain, vomiting or diarrhea, please make me aware.   Follow up 6 months

## 2022-03-22 DIAGNOSIS — G4739 Other sleep apnea: Secondary | ICD-10-CM | POA: Diagnosis not present

## 2022-03-22 DIAGNOSIS — M818 Other osteoporosis without current pathological fracture: Secondary | ICD-10-CM | POA: Diagnosis not present

## 2022-03-22 DIAGNOSIS — M5430 Sciatica, unspecified side: Secondary | ICD-10-CM | POA: Diagnosis not present

## 2022-03-22 DIAGNOSIS — I10 Essential (primary) hypertension: Secondary | ICD-10-CM | POA: Diagnosis not present

## 2022-03-22 DIAGNOSIS — E039 Hypothyroidism, unspecified: Secondary | ICD-10-CM | POA: Diagnosis not present

## 2022-03-22 DIAGNOSIS — J4520 Mild intermittent asthma, uncomplicated: Secondary | ICD-10-CM | POA: Diagnosis not present

## 2022-03-22 DIAGNOSIS — M545 Low back pain, unspecified: Secondary | ICD-10-CM | POA: Diagnosis not present

## 2022-03-22 DIAGNOSIS — Z7901 Long term (current) use of anticoagulants: Secondary | ICD-10-CM | POA: Diagnosis not present

## 2022-03-22 DIAGNOSIS — E7849 Other hyperlipidemia: Secondary | ICD-10-CM | POA: Diagnosis not present

## 2022-04-03 DIAGNOSIS — Z1231 Encounter for screening mammogram for malignant neoplasm of breast: Secondary | ICD-10-CM | POA: Diagnosis not present

## 2022-04-20 DIAGNOSIS — J4540 Moderate persistent asthma, uncomplicated: Secondary | ICD-10-CM | POA: Diagnosis not present

## 2022-04-20 DIAGNOSIS — Z7901 Long term (current) use of anticoagulants: Secondary | ICD-10-CM | POA: Diagnosis not present

## 2022-05-02 DIAGNOSIS — M9901 Segmental and somatic dysfunction of cervical region: Secondary | ICD-10-CM | POA: Diagnosis not present

## 2022-05-02 DIAGNOSIS — M542 Cervicalgia: Secondary | ICD-10-CM | POA: Diagnosis not present

## 2022-05-02 DIAGNOSIS — M5442 Lumbago with sciatica, left side: Secondary | ICD-10-CM | POA: Diagnosis not present

## 2022-05-02 DIAGNOSIS — M546 Pain in thoracic spine: Secondary | ICD-10-CM | POA: Diagnosis not present

## 2022-05-02 DIAGNOSIS — M9903 Segmental and somatic dysfunction of lumbar region: Secondary | ICD-10-CM | POA: Diagnosis not present

## 2022-05-02 DIAGNOSIS — M9902 Segmental and somatic dysfunction of thoracic region: Secondary | ICD-10-CM | POA: Diagnosis not present

## 2022-05-04 DIAGNOSIS — Z7901 Long term (current) use of anticoagulants: Secondary | ICD-10-CM | POA: Diagnosis not present

## 2022-05-08 NOTE — Progress Notes (Unsigned)
Office Visit Note  Patient: Dorothy Ford             Date of Birth: Dec 05, 1950           MRN: [id number]             PCP: Neale Burly, MD Referring: Neale Burly, MD Visit Date: 05/14/2022 Occupation: '@GUAROCC'$ @  Subjective:  Pain in both knees   History of Present Illness: [name] is a 71 y.o. female with history of pseudogout, fibromyalgia, DDD, and osteoarthritis. She continues to take colchicine 0.6 mg 1 tablet by mouth once daily.  She has been experiencing increased pain in both knee joints over the past 1 month.  She has not had any recent injury or fall.  She has no mechanical symptoms.  She has been using a cane to assist with ambulation.  Her pain is most severe in the left knee joint.  She had a left knee joint cortisone injection on 12/20/2021 which provided significant relief but her symptoms have returned.  She states that over the past 1 month her mobility has been limited due to discomfort in her lower back as well as both knees.  She is planning on following up at [facility] with Dr. Davy Pique and Dr. Marcello Moores for further evaluation of the lower back pain she is experiencing.  She reports she had an updated DEXA last week and recent labs ordered by her PCP so we will call to obtain these records.    Activities of Daily Living:  Patient reports morning stiffness for all day. Patient Reports nocturnal pain.  Difficulty dressing/grooming: Reports Difficulty climbing stairs: Reports Difficulty getting out of chair: Reports Difficulty using hands for taps, buttons, cutlery, and/or writing: Reports  Review of Systems  Constitutional:  Positive for fatigue.  HENT:  Negative for mouth sores, mouth dryness and nose dryness.   Eyes:  Positive for dryness. Negative for pain and visual disturbance.  Respiratory:  Negative for cough, hemoptysis, shortness of breath and difficulty breathing.   Cardiovascular:  Negative for chest pain,  palpitations, hypertension and swelling in legs/feet.  Gastrointestinal:  Positive for constipation. Negative for blood in stool and diarrhea.  Endocrine: Negative for increased urination.  Genitourinary:  Negative for painful urination and involuntary urination.  Musculoskeletal:  Positive for joint pain, gait problem, joint pain, joint swelling, myalgias, muscle weakness, morning stiffness, muscle tenderness and myalgias.  Skin:  Negative for color change, pallor, rash, hair loss, nodules/bumps, skin tightness, ulcers and sensitivity to sunlight.  Allergic/Immunologic: Negative for susceptible to infections.  Neurological:  Positive for dizziness and numbness. Negative for headaches and weakness.  Hematological:  Negative for swollen glands.  Psychiatric/Behavioral:  Positive for sleep disturbance. Negative for depressed mood. The patient is not nervous/anxious.     PMFS History:  Patient Active Problem List   Diagnosis Date Noted   Rectal bleeding 03/19/2022   Fecal urgency 03/19/2022   Bloating 03/19/2022   Grade I hemorrhoids 12/19/2021   Ankle joint pain 12/07/2019   Low back pain 12/07/2019   Lumbar radiculopathy 12/07/2019   Pain of left upper arm 12/07/2019   Acute foot pain, left 04/15/2019   Bursitis/tendonitis, shoulder 04/23/2018   Inflammatory arthropathy 02/20/2018   Shoulder pain 02/20/2018   Pain in both knees 11/20/2017   Chondromalacia patellae, left knee 01/15/2017   Chondromalacia patellae, right knee 01/15/2017   Osteoarthritis of both hands 07/24/2016   DDD (degenerative disc disease), cervical 07/24/2016  DDD (degenerative disc disease), lumbar 07/24/2016   Osteoporosis 07/24/2016   Glaucoma 07/24/2016   Sleep apnea 07/24/2016   Pseudogout 07/19/2016   Primary osteoarthritis of both knees 07/19/2016   Fibromyalgia 07/19/2016   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   Pulmonary embolism ([place]) 01/18/2012   Anxiety 01/18/2012   Syncope    RHEUMATOID LUNG  12/30/2009   OBESITY, UNSPECIFIED 06/02/2009   DEPRESSIVE DISORDER NOT ELSEWHERE CLASSIFIED 06/02/2009   SUPRAVENTRICULAR TACHYCARDIA 06/02/2009   Chronic diastolic heart failure ([place]) 06/02/2009   COPD 06/02/2009   DIAPHRAGMAT HERN W/O MENTION OBSTRUCTION/GANGREN 06/02/2009   PALPITATIONS 06/02/2009   CHEST PAIN UNSPECIFIED 06/02/2009    Past Medical History:  Diagnosis Date   Acid reflux    Asthma    Cardiac dysrhythmia, unspecified    Carotid bruit    Bilateral   Cataract    Chest pain, unspecified    Chronic airway obstruction, not elsewhere classified    Chronic sinusitis    Congestive heart failure, unspecified    DDD (degenerative disc disease), cervical 07/24/2016   DDD (degenerative disc disease), lumbar 07/24/2016   Depressive disorder, not elsewhere classified    Diaphragmatic hernia without mention of obstruction or gangrene    Edema    left leg   Fibromyalgia    Hemorrhoid    High cholesterol    History of rheumatoid arthritis    Hypothyroidism    Neuropathy    Obesity, unspecified    Obstructive lung disease (generalized) ([place])    Osteoarthritis of both hands 07/24/2016   Osteopenia    Osteoporosis 07/24/2016   Palpitations    Pseudogout involving multiple joints    Sleep apnea    SVT (supraventricular tachycardia) ([place])    Syncope    admitted 09/2011   Thyroid disease    Unspecified essential hypertension     Family History  Problem Relation Age of Onset   Heart disease Mother    Hypertension Mother    Stroke Other    Heart disease Father    Hypertension Father    Kidney failure Sister    Asthma Sister    Past Surgical History:  Procedure Laterality Date   CARPAL TUNNEL RELEASE Bilateral    CATARACT EXTRACTION, BILATERAL     11/02/2021, 11/23/2021   CATHETER ABLATION  02/01/1997   @ [facility]   COLONOSCOPY WITH PROPOFOL N/A 08/18/2021   multiple adenomas. non-bleeding internal hemorrhoids.   INCONTINENCE SURGERY     POLYPECTOMY  08/18/2021    Procedure: POLYPECTOMY;  Surgeon: Montez Morita, Quillian Quince, MD;  Location: [facility];  Service: Gastroenterology;;   removal toenail Bilateral    great toes   VEIN SURGERY BLADDER     Social History   Social History Narrative   Not on file   Immunization History  Administered Date(s) Administered   Moderna Sars-Covid-2 Vaccination 10/08/2019, 11/06/2019     Objective: Vital Signs: BP 128/77 (BP Location: Right Arm, Patient Position: Sitting, Cuff Size: Large)   Pulse 62   Resp 18   Ht '5\' 1"'$  (1.549 m)   Wt 193 lb 3.2 oz (87.6 kg)   BMI 36.50 kg/m    Physical Exam Vitals and nursing note reviewed.  Constitutional:      Appearance: She is well-developed.  HENT:     Head: Normocephalic and atraumatic.  Eyes:     Conjunctiva/sclera: Conjunctivae normal.  Cardiovascular:     Rate and Rhythm: Normal rate and regular rhythm.     Heart sounds: Normal heart sounds.  Pulmonary:  Effort: Pulmonary effort is normal.     Breath sounds: Normal breath sounds.  Abdominal:     General: Bowel sounds are normal.     Palpations: Abdomen is soft.  Musculoskeletal:     Cervical back: Normal range of motion.  Skin:    General: Skin is warm and dry.     Capillary Refill: Capillary refill takes less than 2 seconds.  Neurological:     Mental Status: She is alert and oriented to person, place, and time.  Psychiatric:        Behavior: Behavior normal.      Musculoskeletal Exam: C-spine has limited range of motion.  Thoracic process noted.  Painful range of motion of the lumbar spine.  Painful and limited range of motion of the left shoulder joint.  Elbow joints have good range of motion with no tenderness or inflammation.  Wrist joints have good range of motion with no tenderness or synovitis.  No tenderness or synovitis over MCP joints.  PIP and DIP thickening consistent with osteoarthritis of both hands.  Hip joints have good range of motion with no groin pain.  Painful range of  motion of both knee joints with slightly limited extension and warmth in the left knee.  Ankle joints have good range of motion with no joint tenderness.  Pedal edema noted bilaterally.  CDAI Exam: CDAI Score: -- Patient Global: --; Provider Global: -- Swollen: --; Tender: -- Joint Exam 05/14/2022   No joint exam has been documented for this visit   There is currently no information documented on the homunculus. Go to the Rheumatology activity and complete the homunculus joint exam.  Investigation: No additional findings.  Imaging: No results found.  Recent Labs: Lab Results  Component Value Date   WBC 7.0 06/26/2018   HGB 12.0 06/26/2018   PLT 317 06/26/2018   NA 141 08/16/2021   K 4.0 08/16/2021   CL 102 08/16/2021   CO2 31 08/16/2021   GLUCOSE 94 08/16/2021   BUN 10 08/16/2021   CREATININE 0.69 08/16/2021   BILITOT 0.9 06/26/2018   AST 18 06/26/2018   ALT 18 06/26/2018   PROT 6.7 06/26/2018   CALCIUM 8.5 (L) 08/16/2021   GFRAA >60 08/12/2018    Speciality Comments: No specialty comments available.  Procedures:  Large Joint Inj: L knee on 05/14/2022 11:05 AM Indications: pain Details: 27 G 1.5 in needle, medial approach  Arthrogram: No  Medications: 1.5 mL lidocaine 1 %; 40 mg triamcinolone acetonide 40 MG/ML Aspirate: 0 mL Outcome: tolerated well, no immediate complications Procedure, treatment alternatives, risks and benefits explained, specific risks discussed. Consent was given by the patient. Immediately prior to procedure a time out was called to verify the correct patient, procedure, equipment, support staff and site/side marked as required. Patient was prepped and draped in the usual sterile fashion.     Allergies: Nitroglycerin, Other, Adhesive [tape], Codeine, and Latex   Assessment / Plan:     Visit Diagnoses: Pseudogout - She presents today experiencing increased pain in both knee joints for the past 1 month.  No injury or fall prior to the onset  of symptoms.  She remains on colchicine 0.6 mg 1 tablet.  No missed doses. On examination she has painful range of motion of both knee joints especially the left knee with limited extension and warmth.  X-rays of both knees were updated today.  The left knee joint was injected with cortisone after informed consent was provided.  The procedure note  was completed above and aftercare was discussed.  She was advised to notify us if her symptoms persist or worsen.  She plans on notifying us if she would like to return to have her right knee joint injected with cortisone in the future.  She will remain on colchicine 0.6 mg 1 tablet by mouth daily.  We will call her PCP to obtain the most recent lab results to review.  She will follow up in 6 months or sooner if needed.  Primary osteoarthritis of both hands: She has PIP and DIP thickening consistent with osteoarthritis of both hands.  No tenderness or synovitis of MCP joints.  Complete fist formation noted bilaterally.  Discussed the importance of joint protection and muscle strengthening.  Chronic left shoulder pain: Chronic pain.  Limited range of motion with discomfort.  Chronic pain of left knee: Patient presents today with acute on chronic pain in the left knee joint.  No recent injury or fall.  She has been experiencing increased discomfort for the past 1 month.  She had a left knee joint cortisone injection performed on 12/20/2021 which righted significant relief but her symptoms have returned.  She has been using a cane to assist with ambulation.  X-rays of the left knee joint were updated today.  After informed consent the left knee joint was injected with cortisone.  Procedure note was completed above.  Aftercare was discussed.  She was advised to notify us if her symptoms persist or worsen.  Discussed the importance of lower extremity muscle strengthening.  Primary osteoarthritis of both knees -patient presents today with increased pain in both knee  joints, left greater than right.  No recent injury or fall.  X-rays of both knees were updated today as requested to assess for radiographic progression.  The left knee joint was injected with cortisone as discussed above.  Procedure note was completed above.  She will notify us if she would like to return to have the right knee joint injected with cortisone in the future.  Plan: XR KNEE 3 VIEW RIGHT, XR KNEE 3 VIEW LEFT  DDD (degenerative disc disease), cervical: C-spine has limited range of motion with lateral rotation.  No symptoms of radiculopathy at this time.  DDD (degenerative disc disease), lumbar - MRI of the lumbar spine on 04/20/2020 which revealed lumbar spondylosis, mild multilevel spondylolisthesis, and degenerative disc disease. Chronic pain and stiffness which limit her mobility.  She is using a cane to assist with ambulation. Under the care of [facility] Dr. Davy Pique and Dr. Marcello Moores.  She plans on scheduling a sooner office visit to discuss proceeding with a repeat injection.  Fibromyalgia: She has generalized hyperalgesia and positive tender points on examination.  She continues to experience interval myalgias and muscle tenderness due to fibromyalgia.  She has ongoing fatigue secondary to insomnia.  Discussed the importance of regular exercise and good sleep hygiene.  Osteopenia of multiple sites - DEXA: 10/29/19: BMD measured at forearm radius is 0.543 with T score -2.4. Patient had updated bone density last week.  We will call to obtain these records.  Other medical conditions are listed as follows:   History of CHF (congestive heart failure)  History of COPD  History of pulmonary embolism  Chronic anticoagulation  History of glaucoma  History of sleep apnea  Orders: Orders Placed This Encounter  Procedures   Large Joint Inj   XR KNEE 3 VIEW RIGHT   XR KNEE 3 VIEW LEFT   No orders of  the defined types were placed in this  encounter.    Follow-Up Instructions: Return in about 6 months (around 11/12/2022) for Pseudogout, Fibromyalgia.   Ofilia Neas, PA-C  Note - This record has been created using Dragon software.  Chart creation errors have been sought, but may not always  have been located. Such creation errors do not reflect on  the standard of medical care.

## 2022-05-10 DIAGNOSIS — M810 Age-related osteoporosis without current pathological fracture: Secondary | ICD-10-CM | POA: Diagnosis not present

## 2022-05-11 DIAGNOSIS — M25562 Pain in left knee: Secondary | ICD-10-CM | POA: Diagnosis not present

## 2022-05-11 DIAGNOSIS — M5010 Cervical disc disorder with radiculopathy, unspecified cervical region: Secondary | ICD-10-CM | POA: Diagnosis not present

## 2022-05-11 DIAGNOSIS — M25571 Pain in right ankle and joints of right foot: Secondary | ICD-10-CM | POA: Diagnosis not present

## 2022-05-11 DIAGNOSIS — M5416 Radiculopathy, lumbar region: Secondary | ICD-10-CM | POA: Diagnosis not present

## 2022-05-11 DIAGNOSIS — M79622 Pain in left upper arm: Secondary | ICD-10-CM | POA: Diagnosis not present

## 2022-05-11 DIAGNOSIS — Z79891 Long term (current) use of opiate analgesic: Secondary | ICD-10-CM | POA: Diagnosis not present

## 2022-05-11 DIAGNOSIS — M25512 Pain in left shoulder: Secondary | ICD-10-CM | POA: Diagnosis not present

## 2022-05-11 DIAGNOSIS — G4733 Obstructive sleep apnea (adult) (pediatric): Secondary | ICD-10-CM | POA: Diagnosis not present

## 2022-05-11 DIAGNOSIS — M545 Low back pain, unspecified: Secondary | ICD-10-CM | POA: Diagnosis not present

## 2022-05-14 ENCOUNTER — Ambulatory Visit (INDEPENDENT_AMBULATORY_CARE_PROVIDER_SITE_OTHER): Payer: Medicare Other

## 2022-05-14 ENCOUNTER — Ambulatory Visit: Payer: Medicare Other | Attending: Physician Assistant | Admitting: Physician Assistant

## 2022-05-14 ENCOUNTER — Encounter: Payer: Self-pay | Admitting: Physician Assistant

## 2022-05-14 ENCOUNTER — Encounter (HOSPITAL_BASED_OUTPATIENT_CLINIC_OR_DEPARTMENT_OTHER): Payer: Self-pay

## 2022-05-14 VITALS — BP 128/77 | HR 62 | Resp 18 | Ht 61.0 in | Wt 193.2 lb

## 2022-05-14 DIAGNOSIS — M1711 Unilateral primary osteoarthritis, right knee: Secondary | ICD-10-CM | POA: Diagnosis not present

## 2022-05-14 DIAGNOSIS — Z8709 Personal history of other diseases of the respiratory system: Secondary | ICD-10-CM | POA: Diagnosis not present

## 2022-05-14 DIAGNOSIS — R400 Somnolence: Secondary | ICD-10-CM

## 2022-05-14 DIAGNOSIS — M19041 Primary osteoarthritis, right hand: Secondary | ICD-10-CM | POA: Diagnosis not present

## 2022-05-14 DIAGNOSIS — Z8679 Personal history of other diseases of the circulatory system: Secondary | ICD-10-CM | POA: Diagnosis not present

## 2022-05-14 DIAGNOSIS — G4700 Insomnia, unspecified: Secondary | ICD-10-CM

## 2022-05-14 DIAGNOSIS — M1120 Other chondrocalcinosis, unspecified site: Secondary | ICD-10-CM

## 2022-05-14 DIAGNOSIS — M5136 Other intervertebral disc degeneration, lumbar region: Secondary | ICD-10-CM | POA: Diagnosis not present

## 2022-05-14 DIAGNOSIS — Z7901 Long term (current) use of anticoagulants: Secondary | ICD-10-CM

## 2022-05-14 DIAGNOSIS — Z8669 Personal history of other diseases of the nervous system and sense organs: Secondary | ICD-10-CM

## 2022-05-14 DIAGNOSIS — M5030 Other cervical disc degeneration, unspecified cervical region: Secondary | ICD-10-CM

## 2022-05-14 DIAGNOSIS — M8589 Other specified disorders of bone density and structure, multiple sites: Secondary | ICD-10-CM | POA: Diagnosis not present

## 2022-05-14 DIAGNOSIS — M1712 Unilateral primary osteoarthritis, left knee: Secondary | ICD-10-CM | POA: Diagnosis not present

## 2022-05-14 DIAGNOSIS — M25562 Pain in left knee: Secondary | ICD-10-CM

## 2022-05-14 DIAGNOSIS — M797 Fibromyalgia: Secondary | ICD-10-CM

## 2022-05-14 DIAGNOSIS — M19042 Primary osteoarthritis, left hand: Secondary | ICD-10-CM

## 2022-05-14 DIAGNOSIS — G8929 Other chronic pain: Secondary | ICD-10-CM | POA: Diagnosis not present

## 2022-05-14 DIAGNOSIS — Z86711 Personal history of pulmonary embolism: Secondary | ICD-10-CM | POA: Diagnosis not present

## 2022-05-14 DIAGNOSIS — G4710 Hypersomnia, unspecified: Secondary | ICD-10-CM

## 2022-05-14 DIAGNOSIS — R0683 Snoring: Secondary | ICD-10-CM

## 2022-05-14 DIAGNOSIS — R0681 Apnea, not elsewhere classified: Secondary | ICD-10-CM

## 2022-05-14 DIAGNOSIS — M51369 Other intervertebral disc degeneration, lumbar region without mention of lumbar back pain or lower extremity pain: Secondary | ICD-10-CM

## 2022-05-14 DIAGNOSIS — M25512 Pain in left shoulder: Secondary | ICD-10-CM | POA: Diagnosis not present

## 2022-05-14 DIAGNOSIS — G4752 REM sleep behavior disorder: Secondary | ICD-10-CM

## 2022-05-14 DIAGNOSIS — M170 Bilateral primary osteoarthritis of knee: Secondary | ICD-10-CM

## 2022-05-14 MED ORDER — TRIAMCINOLONE ACETONIDE 40 MG/ML IJ SUSP
40.0000 mg | INTRAMUSCULAR | Status: AC | PRN
  Administered 2022-05-14: 40 mg via INTRA_ARTICULAR

## 2022-05-14 MED ORDER — LIDOCAINE HCL 1 % IJ SOLN
1.5000 mL | INTRAMUSCULAR | Status: AC | PRN
  Administered 2022-05-14: 1.5 mL

## 2022-05-15 NOTE — Progress Notes (Signed)
X-rays of both knees are consistent with moderate OA and severe chondromalacia patella.  No radiographic progression. Please notify the patient.

## 2022-05-22 ENCOUNTER — Ambulatory Visit: Payer: Medicare Other | Admitting: Neurology

## 2022-05-22 DIAGNOSIS — G4710 Hypersomnia, unspecified: Secondary | ICD-10-CM

## 2022-05-22 DIAGNOSIS — R0683 Snoring: Secondary | ICD-10-CM

## 2022-05-22 DIAGNOSIS — R400 Somnolence: Secondary | ICD-10-CM

## 2022-05-22 DIAGNOSIS — G4700 Insomnia, unspecified: Secondary | ICD-10-CM

## 2022-05-22 DIAGNOSIS — G4752 REM sleep behavior disorder: Secondary | ICD-10-CM

## 2022-05-22 DIAGNOSIS — R0681 Apnea, not elsewhere classified: Secondary | ICD-10-CM

## 2022-05-26 NOTE — Procedures (Unsigned)
    NAME: Dorothy Ford DATE OF BIRTH:  June 27, 1951 MEDICAL RECORD NUMBER [id number]  [facility]  PHYSICIAN: Jenetta Wease  DATE OF STUDY: 05/22/2022  SLEEP STUDY TYPE: Out of Center Sleep Test                REFERRING PHYSICIAN: Barton Fanny, NP  INDICATION FOR STUDY: ***  EPWORTH SLEEPINESS SCORE:   HEIGHT:    WEIGHT:      There is no height or weight on file to calculate BMI.  NECK SIZE:   in.  MEDICATIONS: ***  IMPRESSION:  ***    RECOMMENDATION:  ***   Evelyn Aguinaldo, [place], American Board of Sleep Medicine  ELECTRONICALLY SIGNED ON:  05/26/2022, 5:16 PM [place] PH: [phone]   FX: [phone] [place]

## 2022-05-30 DIAGNOSIS — M9903 Segmental and somatic dysfunction of lumbar region: Secondary | ICD-10-CM | POA: Diagnosis not present

## 2022-05-30 DIAGNOSIS — M546 Pain in thoracic spine: Secondary | ICD-10-CM | POA: Diagnosis not present

## 2022-05-30 DIAGNOSIS — M9901 Segmental and somatic dysfunction of cervical region: Secondary | ICD-10-CM | POA: Diagnosis not present

## 2022-05-30 DIAGNOSIS — M5442 Lumbago with sciatica, left side: Secondary | ICD-10-CM | POA: Diagnosis not present

## 2022-05-30 DIAGNOSIS — M542 Cervicalgia: Secondary | ICD-10-CM | POA: Diagnosis not present

## 2022-05-30 DIAGNOSIS — M9902 Segmental and somatic dysfunction of thoracic region: Secondary | ICD-10-CM | POA: Diagnosis not present

## 2022-06-02 DIAGNOSIS — E039 Hypothyroidism, unspecified: Secondary | ICD-10-CM | POA: Diagnosis not present

## 2022-06-02 DIAGNOSIS — G4739 Other sleep apnea: Secondary | ICD-10-CM | POA: Diagnosis not present

## 2022-06-02 DIAGNOSIS — M545 Low back pain, unspecified: Secondary | ICD-10-CM | POA: Diagnosis not present

## 2022-06-02 DIAGNOSIS — I10 Essential (primary) hypertension: Secondary | ICD-10-CM | POA: Diagnosis not present

## 2022-06-02 DIAGNOSIS — M818 Other osteoporosis without current pathological fracture: Secondary | ICD-10-CM | POA: Diagnosis not present

## 2022-06-02 DIAGNOSIS — J4520 Mild intermittent asthma, uncomplicated: Secondary | ICD-10-CM | POA: Diagnosis not present

## 2022-06-02 DIAGNOSIS — E7849 Other hyperlipidemia: Secondary | ICD-10-CM | POA: Diagnosis not present

## 2022-06-05 ENCOUNTER — Ambulatory Visit: Payer: Medicare Other | Attending: Neurology | Admitting: Neurology

## 2022-06-05 DIAGNOSIS — G4752 REM sleep behavior disorder: Secondary | ICD-10-CM

## 2022-06-05 DIAGNOSIS — R400 Somnolence: Secondary | ICD-10-CM

## 2022-06-05 DIAGNOSIS — R0683 Snoring: Secondary | ICD-10-CM | POA: Insufficient documentation

## 2022-06-05 DIAGNOSIS — G4733 Obstructive sleep apnea (adult) (pediatric): Secondary | ICD-10-CM | POA: Diagnosis not present

## 2022-06-05 DIAGNOSIS — R0681 Apnea, not elsewhere classified: Secondary | ICD-10-CM

## 2022-06-05 DIAGNOSIS — G4700 Insomnia, unspecified: Secondary | ICD-10-CM

## 2022-06-05 DIAGNOSIS — G4710 Hypersomnia, unspecified: Secondary | ICD-10-CM

## 2022-06-07 NOTE — Procedures (Signed)
[place] A. Merlene Laughter, MD     [email or web address]               HOME SLEEP STUDY  LOCATION: [facility]    Patient Name: Dorothy Ford, Dorothy Ford Date: 06/05/2022 Gender: Female D.O.B: 1951/05/13 Age (years): 7 Referring Provider: Barton Fanny NP Height (inches): 61 Interpreting Physician: Phillips Odor MD, ABSM Weight (lbs): 193 RPSGT: Rosebud Poles BMI: 36 MRN: [id number] Neck Size: CLINICAL INFORMATION Sleep Study Type: HST     Indication for sleep study: N/A     Epworth Sleepiness Score: N/A  SLEEP STUDY TECHNIQUE A multi-channel overnight portable sleep study was performed. The channels recorded were: nasal airflow, thoracic respiratory movement, and oxygen saturation with a pulse oximetry. Snoring was also monitored.  MEDICATIONS Patient self administered medications include: N/A.  Current Outpatient Medications:    albuterol (PROVENTIL HFA;VENTOLIN HFA) 108 (90 Base) MCG/ACT inhaler, Inhale 2 puffs into the lungs every 6 (six) hours as needed for wheezing or shortness of breath., Disp: , Rfl:    alendronate (FOSAMAX) 70 MG tablet, Take 70 mg by mouth every Friday. Take with a full glass of water on an empty stomach., Disp: , Rfl:    amLODipine (NORVASC) 5 MG tablet, Take 5 mg by mouth daily., Disp: , Rfl:    ascorbic acid (VITAMIN C) 500 MG tablet, Take 500 mg by mouth daily., Disp: , Rfl:    atorvastatin (LIPITOR) 40 MG tablet, Take 40 mg by mouth daily., Disp: , Rfl:    azelastine (ASTELIN) 0.1 % nasal spray, Place 1 spray into both nostrils daily., Disp: , Rfl:    Calcium Carb-Cholecalciferol (CALCIUM 600 + D PO), Take 1 tablet by mouth daily., Disp: , Rfl:    clonazePAM (KLONOPIN) 0.5 MG tablet, Take 0.5 mg by mouth at bedtime. , Disp: , Rfl:    COLCRYS 0.6 MG tablet, TAKE 1 TABLET BY MOUTH ONCE DAILY., Disp: 30 tablet, Rfl: 0   cycloSPORINE (RESTASIS) 0.05 % ophthalmic emulsion, Place 1 drop into both eyes 2 (two) times daily., Disp:  , Rfl:    dicyclomine (BENTYL) 10 MG capsule, Take 1 capsule (10 mg total) by mouth 3 (three) times daily as needed for spasms., Disp: 90 capsule, Rfl: 0   Elastic Bandages & Supports ([place]) MISC, 1 each by Does not apply route as directed. 1 pair low pressure knee high compression stockings Dx: leg edema, Disp: 1 each, Rfl: 0   famotidine (PEPCID) 20 MG tablet, Take 20 mg by mouth 2 (two) times daily., Disp: , Rfl:    fexofenadine (ALLEGRA) 180 MG tablet, Take 180 mg by mouth daily., Disp: , Rfl:    furosemide (LASIX) 40 MG tablet, TAKE 1 TABLET BY MOUTH ONCE DAILY., Disp: 30 tablet, Rfl: 6   gabapentin (NEURONTIN) 300 MG capsule, Take 300 mg by mouth 3 (three) times daily. , Disp: , Rfl:    guaiFENesin (MUCINEX) 600 MG 12 hr tablet, Take 600 mg by mouth 2 (two) times daily as needed for to loosen phlegm., Disp: , Rfl:    hydrocortisone (ANUSOL-HC) 2.5 % rectal cream, Place 1 application rectally 2 (two) times daily. Twice daily x 10 days then up to twice daily as needed for itching/irritation from hemorrhoids, Disp: 30 g, Rfl: 1   ipratropium-albuterol (DUONEB) 0.5-2.5 (3) MG/3ML SOLN, Take 3 mLs by nebulization every 6 (six) hours as needed. , Disp: , Rfl:    levalbuterol (XOPENEX HFA) 45 MCG/ACT inhaler, Inhale 2 puffs into the lungs every 6 (  six) hours as needed for wheezing., Disp: , Rfl:    levothyroxine (SYNTHROID, LEVOTHROID) 50 MCG tablet, Take 50 mcg by mouth daily before breakfast. , Disp: , Rfl:    loperamide (IMODIUM A-D) 2 MG tablet, Take 2 mg by mouth 4 (four) times daily as needed for diarrhea or loose stools., Disp: , Rfl:    metoprolol tartrate (LOPRESSOR) 25 MG tablet, Take 25 mg by mouth daily., Disp: , Rfl:    montelukast (SINGULAIR) 10 MG tablet, Take 1 tablet by mouth daily., Disp: , Rfl:    mupirocin ointment (BACTROBAN) 2 %, Apply 1 application. topically 3 (three) times daily., Disp: , Rfl:    naloxone (NARCAN) 4 MG/0.1ML LIQD nasal spray kit,  Narcan 4 mg/actuation nasal spray, Disp: , Rfl:    nystatin (MYCOSTATIN) powder, Apply topically as needed., Disp: , Rfl:    ondansetron (ZOFRAN) 4 MG tablet, as needed. , Disp: , Rfl:    oxyCODONE (OXY IR/ROXICODONE) 5 MG immediate release tablet, as needed. , Disp: , Rfl:    pantoprazole (PROTONIX) 40 MG tablet, Take 1 tablet (40 mg total) by mouth daily before breakfast., Disp: 90 tablet, Rfl: 1   PARoxetine (PAXIL) 30 MG tablet, Take 30 mg by mouth daily., Disp: , Rfl:    potassium chloride (K-DUR) 10 MEQ tablet, Take 3 tablets (30 mEq total) by mouth daily., Disp: 90 tablet, Rfl: 6   Sennosides (SENOKOT PO), Take by mouth in the morning and at bedtime. , Disp: , Rfl:    Simethicone 125 MG CAPS, Take 2 capsules by mouth as needed., Disp: , Rfl:    SYMBICORT 160-4.5 MCG/ACT inhaler, Inhale 2 puffs into the lungs 2 (two) times daily., Disp: , Rfl:    warfarin (COUMADIN) 2 MG tablet, Take 2 mg by mouth as directed. Managed by PCP, Disp: , Rfl:    SLEEP ARCHITECTURE Patient was studied for 559 minutes. The sleep efficiency was 92.4 % and the patient was supine for 0%. The arousal index was 0.0 per hour.  RESPIRATORY PARAMETERS The overall AHI was 11.7 per hour, with a central apnea index of 0 per hour.  The oxygen nadir was 86% during sleep.     CARDIAC DATA Mean heart rate during sleep was 68.4 bpm.  IMPRESSIONS Mild obstructive sleep apnea occurred during this study (AHI = 11.7/h). AutoPAP 8-14 is recommended.      Delano Metz, MD Diplomate, American Board of Sleep Medicine.   ELECTRONICALLY SIGNED ON:  06/07/2022, 4:44 PM [place] PH: [phone]   FX: [phone] [place]

## 2022-06-08 DIAGNOSIS — Z7901 Long term (current) use of anticoagulants: Secondary | ICD-10-CM | POA: Diagnosis not present

## 2022-06-17 DIAGNOSIS — R600 Localized edema: Secondary | ICD-10-CM | POA: Diagnosis not present

## 2022-06-17 DIAGNOSIS — W57XXXA Bitten or stung by nonvenomous insect and other nonvenomous arthropods, initial encounter: Secondary | ICD-10-CM | POA: Diagnosis not present

## 2022-06-17 DIAGNOSIS — L03213 Periorbital cellulitis: Secondary | ICD-10-CM | POA: Diagnosis not present

## 2022-06-20 DIAGNOSIS — J4540 Moderate persistent asthma, uncomplicated: Secondary | ICD-10-CM | POA: Diagnosis not present

## 2022-06-21 ENCOUNTER — Ambulatory Visit: Payer: Medicare Other | Admitting: Physician Assistant

## 2022-06-28 DIAGNOSIS — L0390 Cellulitis, unspecified: Secondary | ICD-10-CM | POA: Diagnosis not present

## 2022-07-03 ENCOUNTER — Ambulatory Visit: Payer: Medicare Other | Admitting: Cardiology

## 2022-07-03 DIAGNOSIS — J4520 Mild intermittent asthma, uncomplicated: Secondary | ICD-10-CM | POA: Diagnosis not present

## 2022-07-03 DIAGNOSIS — E039 Hypothyroidism, unspecified: Secondary | ICD-10-CM | POA: Diagnosis not present

## 2022-07-03 DIAGNOSIS — I10 Essential (primary) hypertension: Secondary | ICD-10-CM | POA: Diagnosis not present

## 2022-07-03 DIAGNOSIS — E7849 Other hyperlipidemia: Secondary | ICD-10-CM | POA: Diagnosis not present

## 2022-07-04 DIAGNOSIS — H1045 Other chronic allergic conjunctivitis: Secondary | ICD-10-CM | POA: Diagnosis not present

## 2022-07-13 DIAGNOSIS — Z7901 Long term (current) use of anticoagulants: Secondary | ICD-10-CM | POA: Diagnosis not present

## 2022-07-19 DIAGNOSIS — M25562 Pain in left knee: Secondary | ICD-10-CM | POA: Diagnosis not present

## 2022-07-19 DIAGNOSIS — M25571 Pain in right ankle and joints of right foot: Secondary | ICD-10-CM | POA: Diagnosis not present

## 2022-07-19 DIAGNOSIS — M5416 Radiculopathy, lumbar region: Secondary | ICD-10-CM | POA: Diagnosis not present

## 2022-07-19 DIAGNOSIS — G4733 Obstructive sleep apnea (adult) (pediatric): Secondary | ICD-10-CM | POA: Diagnosis not present

## 2022-07-19 DIAGNOSIS — M79622 Pain in left upper arm: Secondary | ICD-10-CM | POA: Diagnosis not present

## 2022-07-19 DIAGNOSIS — Z79891 Long term (current) use of opiate analgesic: Secondary | ICD-10-CM | POA: Diagnosis not present

## 2022-07-19 DIAGNOSIS — M25512 Pain in left shoulder: Secondary | ICD-10-CM | POA: Diagnosis not present

## 2022-07-19 DIAGNOSIS — M5010 Cervical disc disorder with radiculopathy, unspecified cervical region: Secondary | ICD-10-CM | POA: Diagnosis not present

## 2022-07-19 DIAGNOSIS — M545 Low back pain, unspecified: Secondary | ICD-10-CM | POA: Diagnosis not present

## 2022-07-21 DIAGNOSIS — J4540 Moderate persistent asthma, uncomplicated: Secondary | ICD-10-CM | POA: Diagnosis not present

## 2022-07-31 DIAGNOSIS — S63592S Other specified sprain of left wrist, sequela: Secondary | ICD-10-CM | POA: Diagnosis not present

## 2022-07-31 DIAGNOSIS — M5459 Other low back pain: Secondary | ICD-10-CM | POA: Diagnosis not present

## 2022-08-02 DIAGNOSIS — E7849 Other hyperlipidemia: Secondary | ICD-10-CM | POA: Diagnosis not present

## 2022-08-02 DIAGNOSIS — E038 Other specified hypothyroidism: Secondary | ICD-10-CM | POA: Diagnosis not present

## 2022-08-02 DIAGNOSIS — I10 Essential (primary) hypertension: Secondary | ICD-10-CM | POA: Diagnosis not present

## 2022-08-03 DIAGNOSIS — Z7901 Long term (current) use of anticoagulants: Secondary | ICD-10-CM | POA: Diagnosis not present

## 2022-08-06 DIAGNOSIS — M5441 Lumbago with sciatica, right side: Secondary | ICD-10-CM | POA: Diagnosis not present

## 2022-08-06 DIAGNOSIS — M9903 Segmental and somatic dysfunction of lumbar region: Secondary | ICD-10-CM | POA: Diagnosis not present

## 2022-08-06 DIAGNOSIS — M546 Pain in thoracic spine: Secondary | ICD-10-CM | POA: Diagnosis not present

## 2022-08-06 DIAGNOSIS — M9902 Segmental and somatic dysfunction of thoracic region: Secondary | ICD-10-CM | POA: Diagnosis not present

## 2022-08-06 DIAGNOSIS — M9901 Segmental and somatic dysfunction of cervical region: Secondary | ICD-10-CM | POA: Diagnosis not present

## 2022-08-06 DIAGNOSIS — M542 Cervicalgia: Secondary | ICD-10-CM | POA: Diagnosis not present

## 2022-08-08 DIAGNOSIS — M810 Age-related osteoporosis without current pathological fracture: Secondary | ICD-10-CM | POA: Diagnosis not present

## 2022-08-14 DIAGNOSIS — H1045 Other chronic allergic conjunctivitis: Secondary | ICD-10-CM | POA: Diagnosis not present

## 2022-08-17 ENCOUNTER — Ambulatory Visit: Payer: Medicare Other | Admitting: Internal Medicine

## 2022-08-20 DIAGNOSIS — J4540 Moderate persistent asthma, uncomplicated: Secondary | ICD-10-CM | POA: Diagnosis not present

## 2022-08-28 ENCOUNTER — Ambulatory Visit: Payer: Medicare Other | Admitting: Internal Medicine

## 2022-08-31 DIAGNOSIS — Z7901 Long term (current) use of anticoagulants: Secondary | ICD-10-CM | POA: Diagnosis not present

## 2022-09-08 DIAGNOSIS — Z20822 Contact with and (suspected) exposure to covid-19: Secondary | ICD-10-CM | POA: Diagnosis not present

## 2022-09-08 DIAGNOSIS — U071 COVID-19: Secondary | ICD-10-CM | POA: Diagnosis not present

## 2022-09-12 ENCOUNTER — Ambulatory Visit: Payer: Medicare Other | Admitting: Internal Medicine

## 2022-09-20 ENCOUNTER — Ambulatory Visit (INDEPENDENT_AMBULATORY_CARE_PROVIDER_SITE_OTHER): Payer: Medicare Other | Admitting: Gastroenterology

## 2022-09-20 DIAGNOSIS — J4540 Moderate persistent asthma, uncomplicated: Secondary | ICD-10-CM | POA: Diagnosis not present

## 2022-09-27 DIAGNOSIS — G894 Chronic pain syndrome: Secondary | ICD-10-CM | POA: Diagnosis not present

## 2022-09-27 DIAGNOSIS — M170 Bilateral primary osteoarthritis of knee: Secondary | ICD-10-CM | POA: Diagnosis not present

## 2022-10-06 ENCOUNTER — Other Ambulatory Visit (INDEPENDENT_AMBULATORY_CARE_PROVIDER_SITE_OTHER): Payer: Self-pay | Admitting: Gastroenterology

## 2022-10-09 DIAGNOSIS — S63592S Other specified sprain of left wrist, sequela: Secondary | ICD-10-CM | POA: Diagnosis not present

## 2022-10-09 DIAGNOSIS — Z0000 Encounter for general adult medical examination without abnormal findings: Secondary | ICD-10-CM | POA: Diagnosis not present

## 2022-10-09 DIAGNOSIS — Z7901 Long term (current) use of anticoagulants: Secondary | ICD-10-CM | POA: Diagnosis not present

## 2022-10-09 DIAGNOSIS — G4739 Other sleep apnea: Secondary | ICD-10-CM | POA: Diagnosis not present

## 2022-10-09 DIAGNOSIS — J4520 Mild intermittent asthma, uncomplicated: Secondary | ICD-10-CM | POA: Diagnosis not present

## 2022-10-09 DIAGNOSIS — J370 Chronic laryngitis: Secondary | ICD-10-CM | POA: Diagnosis not present

## 2022-10-09 DIAGNOSIS — M5459 Other low back pain: Secondary | ICD-10-CM | POA: Diagnosis not present

## 2022-10-21 DIAGNOSIS — J4540 Moderate persistent asthma, uncomplicated: Secondary | ICD-10-CM | POA: Diagnosis not present

## 2022-10-23 ENCOUNTER — Ambulatory Visit: Payer: 59 | Admitting: Internal Medicine

## 2022-10-24 DIAGNOSIS — M9902 Segmental and somatic dysfunction of thoracic region: Secondary | ICD-10-CM | POA: Diagnosis not present

## 2022-10-24 DIAGNOSIS — M9901 Segmental and somatic dysfunction of cervical region: Secondary | ICD-10-CM | POA: Diagnosis not present

## 2022-10-24 DIAGNOSIS — M62830 Muscle spasm of back: Secondary | ICD-10-CM | POA: Diagnosis not present

## 2022-10-24 DIAGNOSIS — M9903 Segmental and somatic dysfunction of lumbar region: Secondary | ICD-10-CM | POA: Diagnosis not present

## 2022-10-24 DIAGNOSIS — M546 Pain in thoracic spine: Secondary | ICD-10-CM | POA: Diagnosis not present

## 2022-10-24 DIAGNOSIS — M542 Cervicalgia: Secondary | ICD-10-CM | POA: Diagnosis not present

## 2022-10-30 ENCOUNTER — Ambulatory Visit (INDEPENDENT_AMBULATORY_CARE_PROVIDER_SITE_OTHER): Payer: 59 | Admitting: Gastroenterology

## 2022-10-30 NOTE — Progress Notes (Deleted)
Office Visit Note  Patient: Dorothy Ford             Date of Birth: [birth year]           MRN: [id number]             PCP: Neale Burly, MD Referring: Neale Burly, MD Visit Date: 11/13/2022 Occupation: '@GUAROCC'$ @  Subjective:  No chief complaint on file.   History of Present Illness: Dorothy Ford is a 72 y.o. female ***     Activities of Daily Living:  Patient reports morning stiffness for *** {minute/hour:19697}.   Patient {ACTIONS;DENIES/REPORTS:21021675::"Denies"} nocturnal pain.  Difficulty dressing/grooming: {ACTIONS;DENIES/REPORTS:21021675::"Denies"} Difficulty climbing stairs: {ACTIONS;DENIES/REPORTS:21021675::"Denies"} Difficulty getting out of chair: {ACTIONS;DENIES/REPORTS:21021675::"Denies"} Difficulty using hands for taps, buttons, cutlery, and/or writing: {ACTIONS;DENIES/REPORTS:21021675::"Denies"}  No Rheumatology ROS completed.   PMFS History:  Patient Active Problem List   Diagnosis Date Noted   Rectal bleeding 03/19/2022   Fecal urgency 03/19/2022   Bloating 03/19/2022   Grade I hemorrhoids 12/19/2021   Ankle joint pain 12/07/2019   Low back pain 12/07/2019   Lumbar radiculopathy 12/07/2019   Pain of left upper arm 12/07/2019   Acute foot pain, left 04/15/2019   Bursitis/tendonitis, shoulder 04/23/2018   Inflammatory arthropathy 02/20/2018   Shoulder pain 02/20/2018   Pain in both knees 11/20/2017   Chondromalacia patellae, left knee 01/15/2017   Chondromalacia patellae, right knee 01/15/2017   Osteoarthritis of both hands 07/24/2016   DDD (degenerative disc disease), cervical 07/24/2016   DDD (degenerative disc disease), lumbar 07/24/2016   Osteoporosis 07/24/2016   Glaucoma 07/24/2016   Sleep apnea 07/24/2016   Pseudogout 07/19/2016   Primary osteoarthritis of both knees 07/19/2016   Fibromyalgia 07/19/2016   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   Pulmonary embolism ([place]) 01/18/2012   Anxiety 01/18/2012   Syncope    RHEUMATOID  LUNG 12/30/2009   OBESITY, UNSPECIFIED 06/02/2009   DEPRESSIVE DISORDER NOT ELSEWHERE CLASSIFIED 06/02/2009   SUPRAVENTRICULAR TACHYCARDIA 06/02/2009   Chronic diastolic heart failure ([employer]) 06/02/2009   COPD 06/02/2009   DIAPHRAGMAT HERN W/O MENTION OBSTRUCTION/GANGREN 06/02/2009   PALPITATIONS 06/02/2009   CHEST PAIN UNSPECIFIED 06/02/2009    Past Medical History:  Diagnosis Date   Acid reflux    Asthma    Cardiac dysrhythmia, unspecified    Carotid bruit    Bilateral   Cataract    Chest pain, unspecified    Chronic airway obstruction, not elsewhere classified    Chronic sinusitis    Congestive heart failure, unspecified    DDD (degenerative disc disease), cervical 07/24/2016   DDD (degenerative disc disease), lumbar 07/24/2016   Depressive disorder, not elsewhere classified    Diaphragmatic hernia without mention of obstruction or gangrene    Edema    left leg   Fibromyalgia    Hemorrhoid    High cholesterol    History of rheumatoid arthritis    Hypothyroidism    Neuropathy    Obesity, unspecified    Obstructive lung disease (generalized) ([place])    Osteoarthritis of both hands 07/24/2016   Osteopenia    Osteoporosis 07/24/2016   Palpitations    Pseudogout involving multiple joints    Sleep apnea    SVT (supraventricular tachycardia) ([place])    Syncope    admitted 09/2011   Thyroid disease    Unspecified essential hypertension     Family History  Problem Relation Age of Onset   Heart disease Mother    Hypertension Mother    Stroke Other    Heart disease Father  Hypertension Father    Kidney failure Sister    Asthma Sister    Past Surgical History:  Procedure Laterality Date   CARPAL TUNNEL RELEASE Bilateral    CATARACT EXTRACTION, BILATERAL     11/02/2021, 11/23/2021   CATHETER ABLATION  02/01/1997   @ [facility]   COLONOSCOPY WITH PROPOFOL N/A 08/18/2021   multiple adenomas. non-bleeding internal hemorrhoids.   INCONTINENCE SURGERY     POLYPECTOMY   08/18/2021   Procedure: POLYPECTOMY;  Surgeon: Harvel Quale, MD;  [facility];  Service: Gastroenterology;;   removal toenail Bilateral    great toes   VEIN SURGERY BLADDER     Social History   Social History Narrative   Not on file   Immunization History  Administered Date(s) Administered   Moderna Sars-Covid-2 Vaccination 10/08/2019, 11/06/2019     Objective: Vital Signs: There were no vitals taken for this visit.   Physical Exam   Musculoskeletal Exam: ***  CDAI Exam: CDAI Score: -- Patient Global: --; Provider Global: -- Swollen: --; Tender: -- Joint Exam 11/13/2022   No joint exam has been documented for this visit   There is currently no information documented on the homunculus. Go to the Rheumatology activity and complete the homunculus joint exam.  Investigation: No additional findings.  Imaging: No results found.  Recent Labs: Lab Results  Component Value Date   WBC 7.0 06/26/2018   HGB 12.0 06/26/2018   PLT 317 06/26/2018   NA 141 08/16/2021   K 4.0 08/16/2021   CL 102 08/16/2021   CO2 31 08/16/2021   GLUCOSE 94 08/16/2021   BUN 10 08/16/2021   CREATININE 0.69 08/16/2021   BILITOT 0.9 06/26/2018   AST 18 06/26/2018   ALT 18 06/26/2018   PROT 6.7 06/26/2018   CALCIUM 8.5 (L) 08/16/2021   GFRAA >60 08/12/2018    Speciality Comments: No specialty comments available.  Procedures:  No procedures performed Allergies: Nitroglycerin, Other, Adhesive [tape], Codeine, and Latex   Assessment / Plan:     Visit Diagnoses: No diagnosis found.  Orders: No orders of the defined types were placed in this encounter.  No orders of the defined types were placed in this encounter.   Face-to-face time spent with patient was *** minutes. Greater than 50% of time was spent in counseling and coordination of care.  Follow-Up Instructions: No follow-ups on file.   Bo Merino, MD  Note - This record has been created using  Editor, commissioning.  Chart creation errors have been sought, but may not always  have been located. Such creation errors do not reflect on  the standard of medical care.

## 2022-11-02 ENCOUNTER — Ambulatory Visit: Payer: 59 | Admitting: Internal Medicine

## 2022-11-09 DIAGNOSIS — Z7901 Long term (current) use of anticoagulants: Secondary | ICD-10-CM | POA: Diagnosis not present

## 2022-11-13 ENCOUNTER — Ambulatory Visit: Payer: Medicare Other | Admitting: Rheumatology

## 2022-11-13 DIAGNOSIS — Z7901 Long term (current) use of anticoagulants: Secondary | ICD-10-CM

## 2022-11-13 DIAGNOSIS — M8589 Other specified disorders of bone density and structure, multiple sites: Secondary | ICD-10-CM

## 2022-11-13 DIAGNOSIS — M19042 Primary osteoarthritis, left hand: Secondary | ICD-10-CM

## 2022-11-13 DIAGNOSIS — M5136 Other intervertebral disc degeneration, lumbar region: Secondary | ICD-10-CM

## 2022-11-13 DIAGNOSIS — M797 Fibromyalgia: Secondary | ICD-10-CM

## 2022-11-13 DIAGNOSIS — Z8709 Personal history of other diseases of the respiratory system: Secondary | ICD-10-CM

## 2022-11-13 DIAGNOSIS — Z8679 Personal history of other diseases of the circulatory system: Secondary | ICD-10-CM

## 2022-11-13 DIAGNOSIS — G8929 Other chronic pain: Secondary | ICD-10-CM

## 2022-11-13 DIAGNOSIS — M170 Bilateral primary osteoarthritis of knee: Secondary | ICD-10-CM

## 2022-11-13 DIAGNOSIS — M5030 Other cervical disc degeneration, unspecified cervical region: Secondary | ICD-10-CM

## 2022-11-13 DIAGNOSIS — M1120 Other chondrocalcinosis, unspecified site: Secondary | ICD-10-CM

## 2022-11-13 DIAGNOSIS — Z86711 Personal history of pulmonary embolism: Secondary | ICD-10-CM

## 2022-11-13 DIAGNOSIS — Z8669 Personal history of other diseases of the nervous system and sense organs: Secondary | ICD-10-CM

## 2022-11-16 ENCOUNTER — Ambulatory Visit: Payer: 59 | Admitting: Internal Medicine

## 2022-11-21 DIAGNOSIS — Z7901 Long term (current) use of anticoagulants: Secondary | ICD-10-CM | POA: Diagnosis not present

## 2022-11-22 NOTE — Progress Notes (Deleted)
Office Visit Note  Patient: Dorothy Ford             Date of Birth: 02-18-1951           MRN: [id number]             PCP: Neale Burly, MD Referring: Neale Burly, MD Visit Date: 12/06/2022 Occupation: @GUAROCC @  Subjective:    History of Present Illness: Dorothy Ford is a 72 y.o. female with history of pseudogout, osteoarthritis, fibromyalgia, and osteoporosis.  She is taking colchicine 0.6 mg 1 tablet by mouth daily.    Overdue to update DEXA  She is taking fosamax 70 mg 1 tablet by mouth once weekly. She is taking a calcium and vitamin D supplement.       Activities of Daily Living:  Patient reports morning stiffness for *** {minute/hour:19697}.   Patient {ACTIONS;DENIES/REPORTS:21021675::"Denies"} nocturnal pain.  Difficulty dressing/grooming: {ACTIONS;DENIES/REPORTS:21021675::"Denies"} Difficulty climbing stairs: {ACTIONS;DENIES/REPORTS:21021675::"Denies"} Difficulty getting out of chair: {ACTIONS;DENIES/REPORTS:21021675::"Denies"} Difficulty using hands for taps, buttons, cutlery, and/or writing: {ACTIONS;DENIES/REPORTS:21021675::"Denies"}  No Rheumatology ROS completed.   PMFS History:  Patient Active Problem List   Diagnosis Date Noted   Rectal bleeding 03/19/2022   Fecal urgency 03/19/2022   Bloating 03/19/2022   Grade I hemorrhoids 12/19/2021   Ankle joint pain 12/07/2019   Low back pain 12/07/2019   Lumbar radiculopathy 12/07/2019   Pain of left upper arm 12/07/2019   Acute foot pain, left 04/15/2019   Bursitis/tendonitis, shoulder 04/23/2018   Inflammatory arthropathy 02/20/2018   Shoulder pain 02/20/2018   Pain in both knees 11/20/2017   Chondromalacia patellae, left knee 01/15/2017   Chondromalacia patellae, right knee 01/15/2017   Osteoarthritis of both hands 07/24/2016   DDD (degenerative disc disease), cervical 07/24/2016   DDD (degenerative disc disease), lumbar 07/24/2016   Osteoporosis 07/24/2016   Glaucoma 07/24/2016    Sleep apnea 07/24/2016   Pseudogout 07/19/2016   Primary osteoarthritis of both knees 07/19/2016   Fibromyalgia 07/19/2016   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   Pulmonary embolism ([place]) 01/18/2012   Anxiety 01/18/2012   Syncope    RHEUMATOID LUNG 12/30/2009   OBESITY, UNSPECIFIED 06/02/2009   DEPRESSIVE DISORDER NOT ELSEWHERE CLASSIFIED 06/02/2009   SUPRAVENTRICULAR TACHYCARDIA 06/02/2009   Chronic diastolic heart failure ([place]) 06/02/2009   COPD 06/02/2009   DIAPHRAGMAT HERN W/O MENTION OBSTRUCTION/GANGREN 06/02/2009   PALPITATIONS 06/02/2009   CHEST PAIN UNSPECIFIED 06/02/2009    Past Medical History:  Diagnosis Date   Acid reflux    Asthma    Cardiac dysrhythmia, unspecified    Carotid bruit    Bilateral   Cataract    Chest pain, unspecified    Chronic airway obstruction, not elsewhere classified    Chronic sinusitis    Congestive heart failure, unspecified    DDD (degenerative disc disease), cervical 07/24/2016   DDD (degenerative disc disease), lumbar 07/24/2016   Depressive disorder, not elsewhere classified    Diaphragmatic hernia without mention of obstruction or gangrene    Edema    left leg   Fibromyalgia    Hemorrhoid    High cholesterol    History of rheumatoid arthritis    Hypothyroidism    Neuropathy    Obesity, unspecified    Obstructive lung disease (generalized) ([place])    Osteoarthritis of both hands 07/24/2016   Osteopenia    Osteoporosis 07/24/2016   Palpitations    Pseudogout involving multiple joints    Sleep apnea    SVT (supraventricular tachycardia) ([place])    Syncope  admitted 09/2011   Thyroid disease    Unspecified essential hypertension     Family History  Problem Relation Age of Onset   Heart disease Mother    Hypertension Mother    Stroke Other    Heart disease Father    Hypertension Father    Kidney failure Sister    Asthma Sister    Past Surgical History:  Procedure Laterality Date   CARPAL TUNNEL RELEASE Bilateral     CATARACT EXTRACTION, BILATERAL     11/02/2021, 11/23/2021   CATHETER ABLATION  02/01/1997   @ [facility]   COLONOSCOPY WITH PROPOFOL N/A 08/18/2021   multiple adenomas. non-bleeding internal hemorrhoids.   INCONTINENCE SURGERY     POLYPECTOMY  08/18/2021   Procedure: POLYPECTOMY;  Surgeon: Harvel Quale, MD;  [facility];  Service: Gastroenterology;;   removal toenail Bilateral    great toes   VEIN SURGERY BLADDER     Social History   Social History Narrative   Not on file   Immunization History  Administered Date(s) Administered   Moderna Sars-Covid-2 Vaccination 10/08/2019, 11/06/2019     Objective: Vital Signs: There were no vitals taken for this visit.   Physical Exam Vitals and nursing note reviewed.  Constitutional:      Appearance: She is well-developed.  HENT:     Head: Normocephalic and atraumatic.  Eyes:     Conjunctiva/sclera: Conjunctivae normal.  Cardiovascular:     Rate and Rhythm: Normal rate and regular rhythm.     Heart sounds: Normal heart sounds.  Pulmonary:     Effort: Pulmonary effort is normal.     Breath sounds: Normal breath sounds.  Abdominal:     General: Bowel sounds are normal.     Palpations: Abdomen is soft.  Musculoskeletal:     Cervical back: Normal range of motion.  Lymphadenopathy:     Cervical: No cervical adenopathy.  Skin:    General: Skin is warm and dry.     Capillary Refill: Capillary refill takes less than 2 seconds.  Neurological:     Mental Status: She is alert and oriented to person, place, and time.  Psychiatric:        Behavior: Behavior normal.      Musculoskeletal Exam: ***  CDAI Exam: CDAI Score: -- Patient Global: --; Provider Global: -- Swollen: --; Tender: -- Joint Exam 12/06/2022   No joint exam has been documented for this visit   There is currently no information documented on the homunculus. Go to the Rheumatology activity and complete the homunculus joint  exam.  Investigation: No additional findings.  Imaging: No results found.  Recent Labs: Lab Results  Component Value Date   WBC 7.0 06/26/2018   HGB 12.0 06/26/2018   PLT 317 06/26/2018   NA 141 08/16/2021   K 4.0 08/16/2021   CL 102 08/16/2021   CO2 31 08/16/2021   GLUCOSE 94 08/16/2021   BUN 10 08/16/2021   CREATININE 0.69 08/16/2021   BILITOT 0.9 06/26/2018   AST 18 06/26/2018   ALT 18 06/26/2018   PROT 6.7 06/26/2018   CALCIUM 8.5 (L) 08/16/2021   GFRAA >60 08/12/2018    Speciality Comments: No specialty comments available.  Procedures:  No procedures performed Allergies: Nitroglycerin, Other, Adhesive [tape], Codeine, and Latex   Assessment / Plan:     Visit Diagnoses: Pseudogout  Primary osteoarthritis of both hands  Chronic left shoulder pain  Primary osteoarthritis of both knees  DDD (degenerative disc disease), cervical  DDD (degenerative disc disease), lumbar  Fibromyalgia  Osteopenia of multiple sites  Age-related osteoporosis without current pathological fracture  History of CHF (congestive heart failure)  History of COPD  History of pulmonary embolism  Chronic anticoagulation  History of glaucoma  History of sleep apnea  Orders: No orders of the defined types were placed in this encounter.  No orders of the defined types were placed in this encounter.    Follow-Up Instructions: No follow-ups on file.   Ofilia Neas, PA-C  Note - This record has been created using Dragon software.  Chart creation errors have been sought, but may not always  have been located. Such creation errors do not reflect on  the standard of medical care.

## 2022-12-06 ENCOUNTER — Ambulatory Visit: Payer: Self-pay | Admitting: Physician Assistant

## 2022-12-06 DIAGNOSIS — Z86711 Personal history of pulmonary embolism: Secondary | ICD-10-CM

## 2022-12-06 DIAGNOSIS — Z8679 Personal history of other diseases of the circulatory system: Secondary | ICD-10-CM

## 2022-12-06 DIAGNOSIS — M170 Bilateral primary osteoarthritis of knee: Secondary | ICD-10-CM

## 2022-12-06 DIAGNOSIS — M810 Age-related osteoporosis without current pathological fracture: Secondary | ICD-10-CM

## 2022-12-06 DIAGNOSIS — Z8709 Personal history of other diseases of the respiratory system: Secondary | ICD-10-CM

## 2022-12-06 DIAGNOSIS — M19041 Primary osteoarthritis, right hand: Secondary | ICD-10-CM

## 2022-12-06 DIAGNOSIS — M8589 Other specified disorders of bone density and structure, multiple sites: Secondary | ICD-10-CM

## 2022-12-06 DIAGNOSIS — M1120 Other chondrocalcinosis, unspecified site: Secondary | ICD-10-CM

## 2022-12-06 DIAGNOSIS — G8929 Other chronic pain: Secondary | ICD-10-CM

## 2022-12-06 DIAGNOSIS — M5030 Other cervical disc degeneration, unspecified cervical region: Secondary | ICD-10-CM

## 2022-12-06 DIAGNOSIS — M797 Fibromyalgia: Secondary | ICD-10-CM

## 2022-12-06 DIAGNOSIS — Z8669 Personal history of other diseases of the nervous system and sense organs: Secondary | ICD-10-CM

## 2022-12-06 DIAGNOSIS — M5136 Other intervertebral disc degeneration, lumbar region: Secondary | ICD-10-CM

## 2022-12-06 DIAGNOSIS — Z7901 Long term (current) use of anticoagulants: Secondary | ICD-10-CM

## 2022-12-07 DIAGNOSIS — Z7901 Long term (current) use of anticoagulants: Secondary | ICD-10-CM | POA: Diagnosis not present

## 2022-12-13 DIAGNOSIS — L309 Dermatitis, unspecified: Secondary | ICD-10-CM | POA: Diagnosis not present

## 2022-12-14 ENCOUNTER — Ambulatory Visit: Payer: 59 | Attending: Internal Medicine

## 2022-12-14 ENCOUNTER — Encounter: Payer: Self-pay | Admitting: Internal Medicine

## 2022-12-14 ENCOUNTER — Ambulatory Visit: Payer: 59 | Attending: Internal Medicine | Admitting: Internal Medicine

## 2022-12-14 VITALS — BP 140/84 | HR 57 | Ht 59.0 in | Wt 178.0 lb

## 2022-12-14 DIAGNOSIS — R002 Palpitations: Secondary | ICD-10-CM

## 2022-12-14 DIAGNOSIS — I10 Essential (primary) hypertension: Secondary | ICD-10-CM | POA: Diagnosis not present

## 2022-12-14 DIAGNOSIS — I5032 Chronic diastolic (congestive) heart failure: Secondary | ICD-10-CM

## 2022-12-14 DIAGNOSIS — I872 Venous insufficiency (chronic) (peripheral): Secondary | ICD-10-CM

## 2022-12-14 NOTE — Progress Notes (Signed)
Cardiology Office Note  Date: 12/14/2022   ID: Dorothy Ford, DOB Mar 23, 1951, MRN [id number]  PCP:  Toma Deiters, MD  Cardiologist:  Marjo Bicker, MD Electrophysiologist:  None   Reason for Office Visit: Follow-up of paroxysmal SVT/palpitations   History of Present Illness: Dorothy Ford is a 72 y.o. female known to have paroxysmal SVT status post SVT ablation 1998, chronic diastolic heart failure, history of PE on chronic anticoagulation, HTN, chronic venous insufficiency is here for follow-up visit.  Patient was last seen in 2022. In the interim, she had repeat COVID infection in 09/2022, had cellulitis in 2023. She continues to have palpitations almost every day at nighttime lasting for a good while. She walks with a walker, has SOB at baseline.  She also has lower extremity swelling which she was diagnosed as chronic venous insufficiency and wears compression socks.  She denies symptoms of angina, dizziness, lightheadedness and syncope.  She has ringing in her ears and describes dizziness similar to room spinning sensation, vertigo.  Past Medical History:  Diagnosis Date   Acid reflux    Asthma    Cardiac dysrhythmia, unspecified    Carotid bruit    Bilateral   Cataract    Chest pain, unspecified    Chronic airway obstruction, not elsewhere classified    Chronic sinusitis    Congestive heart failure, unspecified    DDD (degenerative disc disease), cervical 07/24/2016   DDD (degenerative disc disease), lumbar 07/24/2016   Depressive disorder, not elsewhere classified    Diaphragmatic hernia without mention of obstruction or gangrene    Edema    left leg   Fibromyalgia    Hemorrhoid    High cholesterol    History of rheumatoid arthritis    Hypothyroidism    Neuropathy    Obesity, unspecified    Obstructive lung disease (generalized)    Osteoarthritis of both hands 07/24/2016   Osteopenia    Osteoporosis 07/24/2016   Palpitations    Pseudogout  involving multiple joints    Sleep apnea    SVT (supraventricular tachycardia)    Syncope    admitted 09/2011   Thyroid disease    Unspecified essential hypertension     Past Surgical History:  Procedure Laterality Date   CARPAL TUNNEL RELEASE Bilateral    CATARACT EXTRACTION, BILATERAL     11/02/2021, 11/23/2021   CATHETER ABLATION  02/01/1997   @ [facility]   COLONOSCOPY WITH PROPOFOL N/A 08/18/2021   multiple adenomas. non-bleeding internal hemorrhoids.   INCONTINENCE SURGERY     POLYPECTOMY  08/18/2021   Procedure: POLYPECTOMY;  Surgeon: Marguerita Merles, Reuel Boom, MD;  [facility];  Service: Gastroenterology;;   removal toenail Bilateral    great toes   VEIN SURGERY BLADDER      Current Outpatient Medications  Medication Sig Dispense Refill   albuterol (PROVENTIL HFA;VENTOLIN HFA) 108 (90 Base) MCG/ACT inhaler Inhale 2 puffs into the lungs every 6 (six) hours as needed for wheezing or shortness of breath.     amLODipine (NORVASC) 5 MG tablet Take 5 mg by mouth daily.     ascorbic acid (VITAMIN C) 500 MG tablet Take 500 mg by mouth daily.     atorvastatin (LIPITOR) 40 MG tablet Take 40 mg by mouth daily.     azelastine (ASTELIN) 0.1 % nasal spray Place 1 spray into both nostrils daily.     Calcium Carb-Cholecalciferol (CALCIUM 600 + D PO) Take 1 tablet by mouth daily.  clonazePAM (KLONOPIN) 0.5 MG tablet Take 0.5 mg by mouth at bedtime.      COLCRYS 0.6 MG tablet TAKE 1 TABLET BY MOUTH ONCE DAILY. 30 tablet 0   cycloSPORINE (RESTASIS) 0.05 % ophthalmic emulsion Place 1 drop into both eyes 2 (two) times daily.     dicyclomine (BENTYL) 10 MG capsule TAKE 1 CAPSULE BY MOUTH 3 TIMES DAILY AS NEEDED FOR SPASMS. 90 capsule 0   Elastic Bandages & Supports (MEDICAL COMPRESSION STOCKINGS) MISC 1 each by Does not apply route as directed. 1 pair low pressure knee high compression stockings Dx: leg edema 1 each 0   famotidine (PEPCID) 20 MG tablet Take 20 mg by mouth 2 (two)  times daily.     fexofenadine (ALLEGRA) 180 MG tablet Take 180 mg by mouth daily.     furosemide (LASIX) 40 MG tablet TAKE 1 TABLET BY MOUTH ONCE DAILY. 30 tablet 6   gabapentin (NEURONTIN) 300 MG capsule Take 300 mg by mouth 3 (three) times daily.      guaiFENesin (MUCINEX) 600 MG 12 hr tablet Take 600 mg by mouth 2 (two) times daily as needed for to loosen phlegm.     hydrocortisone (ANUSOL-HC) 2.5 % rectal cream Place 1 application rectally 2 (two) times daily. Twice daily x 10 days then up to twice daily as needed for itching/irritation from hemorrhoids 30 g 1   ipratropium-albuterol (DUONEB) 0.5-2.5 (3) MG/3ML SOLN Take 3 mLs by nebulization every 6 (six) hours as needed.      levalbuterol (XOPENEX HFA) 45 MCG/ACT inhaler Inhale 2 puffs into the lungs every 6 (six) hours as needed for wheezing.     levothyroxine (SYNTHROID, LEVOTHROID) 50 MCG tablet Take 50 mcg by mouth daily before breakfast.      loperamide (IMODIUM A-D) 2 MG tablet Take 2 mg by mouth 4 (four) times daily as needed for diarrhea or loose stools.     metoprolol tartrate (LOPRESSOR) 25 MG tablet Take 25 mg by mouth daily.     montelukast (SINGULAIR) 10 MG tablet Take 1 tablet by mouth daily.     mupirocin ointment (BACTROBAN) 2 % Apply 1 application. topically 3 (three) times daily.     naloxone (NARCAN) 4 MG/0.1ML LIQD nasal spray kit Narcan 4 mg/actuation nasal spray     nystatin (MYCOSTATIN) powder Apply topically as needed.     ondansetron (ZOFRAN) 4 MG tablet as needed.      oxyCODONE (OXY IR/ROXICODONE) 5 MG immediate release tablet as needed.      pantoprazole (PROTONIX) 40 MG tablet Take 1 tablet (40 mg total) by mouth daily before breakfast. 90 tablet 1   PARoxetine (PAXIL) 30 MG tablet Take 30 mg by mouth daily.     potassium chloride (K-DUR) 10 MEQ tablet Take 3 tablets (30 mEq total) by mouth daily. 90 tablet 6   Sennosides (SENOKOT PO) Take by mouth in the morning and at bedtime.      Simethicone 125 MG CAPS  Take 2 capsules by mouth as needed.     SYMBICORT 160-4.5 MCG/ACT inhaler Inhale 2 puffs into the lungs 2 (two) times daily.     warfarin (COUMADIN) 2 MG tablet Take 2 mg by mouth as directed. Managed by PCP     No current facility-administered medications for this visit.   Allergies:  Nitroglycerin, Other, Adhesive [tape], Codeine, and Latex   Social History: The patient  reports that she has never smoked. She has been exposed to tobacco smoke. She has never  used smokeless tobacco. She reports that she does not drink alcohol and does not use drugs.   Family History: The patient's family history includes Asthma in her sister; Heart disease in her father and mother; Hypertension in her father and mother; Kidney failure in her sister; Stroke in an other family member.   ROS:  Please see the history of present illness. Otherwise, complete review of systems is positive for none.  All other systems are reviewed and negative.   Physical Exam: VS:  BP (!) 140/84   Pulse (!) 57   Ht 4\' 11"  (1.499 m)   Wt 178 lb (80.7 kg)   SpO2 96%   BMI 35.95 kg/m , BMI Body mass index is 35.95 kg/m.  Wt Readings from Last 3 Encounters:  12/14/22 178 lb (80.7 kg)  05/14/22 193 lb 3.2 oz (87.6 kg)  03/19/22 191 lb 12.8 oz (87 kg)    General: Patient appears comfortable at rest. HEENT: Conjunctiva and lids normal, oropharynx clear with moist mucosa. Neck: Supple, no elevated JVP or carotid bruits, no thyromegaly. Lungs: Clear to auscultation, nonlabored breathing at rest. Cardiac: Regular rate and rhythm, no S3 or significant systolic murmur, no pericardial rub. Abdomen: Soft, nontender, no hepatomegaly, bowel sounds present, no guarding or rebound. Extremities: No pitting edema, distal pulses 2+. Skin: Warm and dry. Musculoskeletal: No kyphosis. Neuropsychiatric: Alert and oriented x3, affect grossly appropriate.  ECG:  NSR  Recent Labwork: No results found for requested labs within last 365 days.   No results found for: "CHOL", "TRIG", "HDL", "CHOLHDL", "VLDL", "LDLCALC", "LDLDIRECT"   Assessment and Plan: Patient is a 72 year old F known to have paroxysmal SVT status post SVT ablation 1998, chronic diastolic heart failure, history of PE on chronic anticoagulation, HTN, chronic venous insufficiency is here for follow-up visit.  # Palpitations, paroxysmal SVT status post ablation in 1998: Patient has chronic palpitations, almost every day at night, last for a good while. Obtain 1 week event monitor.  Continue metoprolol tartrate 25 mg once daily. # Chronic venous insufficiency: Continues to have trace pitting edema in bilateral lower extremities.  Obtain ultrasound venous reflux study # Chronic diastolic heart failure: Continue Lasix 40 mg once daily, obtain/update 2D echocardiogram # Pulmonary embolism history on chronic anticoagulation: Continue Coumadin, goal INR between 2 and 3. # OSA waiting on CPAP: Patient has OSA, waiting on CPAP which will be delivered in June 2024.   I have spent a total of 30 minutes with patient reviewing chart, EKGs, labs and examining patient as well as establishing an assessment and plan that was discussed with the patient.  > 50% of time was spent in direct patient care.     Medication Adjustments/Labs and Tests Ordered: Current medicines are reviewed at length with the patient today.  Concerns regarding medicines are outlined above.   Tests Ordered: Orders Placed This Encounter  Procedures   EKG 12-Lead    Medication Changes: No orders of the defined types were placed in this encounter.   Disposition:  Follow up  6 months  Signed, Leola Fiore Verne Spurr, MD, 12/14/2022 9:28 AM    [facility] at [facility] [address] [place], [place] [address]

## 2022-12-14 NOTE — Patient Instructions (Signed)
Medication Instructions:  Your physician recommends that you continue on your current medications as directed. Please refer to the Current Medication list given to you today.  *If you need a refill on your cardiac medications before your next appointment, please call your pharmacy*   Lab Work: NONE   If you have labs (blood work) drawn today and your tests are completely normal, you will receive your results only by: MyChart Message (if you have MyChart) OR A paper copy in the mail If you have any lab test that is abnormal or we need to change your treatment, we will call you to review the results.   Testing/Procedures: Your physician has requested that you have an echocardiogram. Echocardiography is a painless test that uses sound waves to create images of your heart. It provides your doctor with information about the size and shape of your heart and how well your heart's chambers and valves are working. This procedure takes approximately one hour. There are no restrictions for this procedure. Please do NOT wear cologne, perfume, aftershave, or lotions (deodorant is allowed). Please arrive 15 minutes prior to your appointment time.  Your physician has requested that you have a lower or upper extremity venous duplex. This test is an ultrasound of the veins in the legs or arms. It looks at venous blood flow that carries blood from the heart to the legs or arms. Allow one hour for a Lower Venous exam. Allow thirty minutes for an Upper Venous exam. There are no restrictions or special instructions.     Follow-Up: At [facility], you and your health needs are our priority.  As part of our continuing mission to provide you with exceptional heart care, we have created designated Provider Care Teams.  These Care Teams include your primary Cardiologist (physician) and Advanced Practice Providers (APPs -  Physician Assistants and Nurse Practitioners) who all work together to provide you  with the care you need, when you need it.  We recommend signing up for the patient portal called "MyChart".  Sign up information is provided on this After Visit Summary.  MyChart is used to connect with patients for Virtual Visits (Telemedicine).  Patients are able to view lab/test results, encounter notes, upcoming appointments, etc.  Non-urgent messages can be sent to your provider as well.   To learn more about what you can do with MyChart, go to [email or web address].    Your next appointment:   6 month(s)  Provider:   You may see Vishnu P Mallipeddi, MD or one of the following Advanced Practice Providers on your designated Care Team:   Randall An, PA-C  Jacolyn Reedy, PA-C     Other Instructions Thank you for choosing [facility]!

## 2022-12-19 DIAGNOSIS — M546 Pain in thoracic spine: Secondary | ICD-10-CM | POA: Diagnosis not present

## 2022-12-19 DIAGNOSIS — M542 Cervicalgia: Secondary | ICD-10-CM | POA: Diagnosis not present

## 2022-12-19 DIAGNOSIS — M9903 Segmental and somatic dysfunction of lumbar region: Secondary | ICD-10-CM | POA: Diagnosis not present

## 2022-12-19 DIAGNOSIS — M62830 Muscle spasm of back: Secondary | ICD-10-CM | POA: Diagnosis not present

## 2022-12-19 DIAGNOSIS — M9902 Segmental and somatic dysfunction of thoracic region: Secondary | ICD-10-CM | POA: Diagnosis not present

## 2022-12-19 DIAGNOSIS — M9901 Segmental and somatic dysfunction of cervical region: Secondary | ICD-10-CM | POA: Diagnosis not present

## 2022-12-21 ENCOUNTER — Ambulatory Visit (HOSPITAL_COMMUNITY): Payer: 59

## 2022-12-24 ENCOUNTER — Ambulatory Visit: Payer: 59 | Admitting: Physician Assistant

## 2022-12-26 DIAGNOSIS — R002 Palpitations: Secondary | ICD-10-CM | POA: Diagnosis not present

## 2022-12-27 ENCOUNTER — Ambulatory Visit (HOSPITAL_COMMUNITY): Payer: 59

## 2022-12-27 NOTE — Progress Notes (Unsigned)
Office Visit Note  Patient: Dorothy Ford             Date of Birth: December 15, 1950           MRN: [id number]             PCP: Toma Deiters, MD Referring: Toma Deiters, MD Visit Date: 01/10/2023 Occupation: @GUAROCC @  Subjective:  Pain in both knees   History of Present Illness: Dorothy Ford is a 72 y.o. female with history pseudogout, osteoarthritis, fibromyalgia, and DDD.  She has been taking colchicine 0.6 mg 1 tablet by mouth daily.  She denies any recent pseudogout flares.  She has had ongoing pain in both knee joints especially in the left knee.  Patient reports that she has an orthopedic consult scheduled on 01/18/2023 to discuss the possibility of surgery.  Patient states that she no longer wants cortisone injections due to being diagnosed with osteoporosis.  She has been started on Reclast per patient.  She has been using a walker to assist with ambulation. She continues to have generalized myalgias and muscle tenderness due to fibromyalgia.  She takes oxycodone as needed for pain relief.   Activities of Daily Living:  Patient reports morning stiffness for all day. Patient Reports nocturnal pain.  Difficulty dressing/grooming: Reports Difficulty climbing stairs: Reports Difficulty getting out of chair: Reports Difficulty using hands for taps, buttons, cutlery, and/or writing: Denies  Review of Systems  Constitutional:  Positive for fatigue.  HENT:  Positive for mouth dryness. Negative for mouth sores.   Eyes:  Positive for dryness.  Respiratory:  Positive for shortness of breath.   Cardiovascular:  Negative for chest pain and palpitations.  Gastrointestinal:  Negative for blood in stool, constipation and diarrhea.  Endocrine: Negative for increased urination.  Genitourinary:  Negative for involuntary urination.  Musculoskeletal:  Positive for joint pain, gait problem, joint pain, joint swelling and morning stiffness. Negative for myalgias, muscle weakness,  muscle tenderness and myalgias.  Skin:  Negative for color change, rash, hair loss and sensitivity to sunlight.  Allergic/Immunologic: Negative for susceptible to infections.  Neurological:  Positive for dizziness and numbness. Negative for headaches.  Hematological:  Negative for swollen glands.  Psychiatric/Behavioral:  Negative for depressed mood and sleep disturbance. The patient is not nervous/anxious.     PMFS History:  Patient Active Problem List   Diagnosis Date Noted   Chronic venous insufficiency 12/14/2022   Rectal bleeding 03/19/2022   Fecal urgency 03/19/2022   Bloating 03/19/2022   Grade I hemorrhoids 12/19/2021   Ankle joint pain 12/07/2019   Low back pain 12/07/2019   Lumbar radiculopathy 12/07/2019   Pain of left upper arm 12/07/2019   Acute foot pain, left 04/15/2019   Bursitis/tendonitis, shoulder 04/23/2018   Inflammatory arthropathy 02/20/2018   Shoulder pain 02/20/2018   Pain in both knees 11/20/2017   Chondromalacia patellae, left knee 01/15/2017   Chondromalacia patellae, right knee 01/15/2017   Osteoarthritis of both hands 07/24/2016   DDD (degenerative disc disease), cervical 07/24/2016   DDD (degenerative disc disease), lumbar 07/24/2016   Osteoporosis 07/24/2016   Glaucoma 07/24/2016   Sleep apnea 07/24/2016   Pseudogout 07/19/2016   Primary osteoarthritis of both knees 07/19/2016   Fibromyalgia 07/19/2016   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   Pulmonary embolism (HCC) 01/18/2012   Anxiety 01/18/2012   Syncope    RHEUMATOID LUNG 12/30/2009   OBESITY, UNSPECIFIED 06/02/2009   DEPRESSIVE DISORDER NOT ELSEWHERE CLASSIFIED 06/02/2009   SUPRAVENTRICULAR TACHYCARDIA 06/02/2009   Chronic  diastolic heart failure (HCC) 06/02/2009   COPD 06/02/2009   DIAPHRAGMAT HERN W/O MENTION OBSTRUCTION/GANGREN 06/02/2009   Palpitations 06/02/2009   CHEST PAIN UNSPECIFIED 06/02/2009    Past Medical History:  Diagnosis Date   Acid reflux    Asthma    Cardiac  dysrhythmia, unspecified    Carotid bruit    Bilateral   Cataract    Chest pain, unspecified    Chronic airway obstruction, not elsewhere classified    Chronic sinusitis    Congestive heart failure, unspecified    DDD (degenerative disc disease), cervical 07/24/2016   DDD (degenerative disc disease), lumbar 07/24/2016   Depressive disorder, not elsewhere classified    Diaphragmatic hernia without mention of obstruction or gangrene    Edema    left leg   Fibromyalgia    Hemorrhoid    High cholesterol    History of rheumatoid arthritis    Hypothyroidism    Neuropathy    Obesity, unspecified    Obstructive lung disease (generalized) (HCC)    Osteoarthritis of both hands 07/24/2016   Osteopenia    Osteoporosis 07/24/2016   Palpitations    Pseudogout involving multiple joints    Sleep apnea    Sleep apnea    per patient   SVT (supraventricular tachycardia)    Syncope    admitted 09/2011   Thyroid disease    Unspecified essential hypertension     Family History  Problem Relation Age of Onset   Heart disease Mother    Hypertension Mother    Stroke Other    Heart disease Father    Hypertension Father    Kidney failure Sister    Asthma Sister    Past Surgical History:  Procedure Laterality Date   CARPAL TUNNEL RELEASE Bilateral    CATARACT EXTRACTION, BILATERAL     11/02/2021, 11/23/2021   CATHETER ABLATION  02/01/1997   @ [facility]   COLONOSCOPY WITH PROPOFOL N/A 08/18/2021   multiple adenomas. non-bleeding internal hemorrhoids.   INCONTINENCE SURGERY     POLYPECTOMY  08/18/2021   Procedure: POLYPECTOMY;  Surgeon: Marguerita Merles, Reuel Boom, MD;  Location: [facility];  Service: Gastroenterology;;   removal toenail Bilateral    great toes   VEIN SURGERY BLADDER     Social History   Social History Narrative   Not on file   Immunization History  Administered Date(s) Administered   Moderna Sars-Covid-2 Vaccination 10/08/2019, 11/06/2019     Objective: Vital  Signs: BP 117/82 (BP Location: Left Arm, Patient Position: Sitting, Cuff Size: Large)   Pulse 62   Resp 14   Ht 4\' 11"  (1.499 m)   Wt 175 lb 12.8 oz (79.7 kg)   BMI 35.51 kg/m    Physical Exam Vitals and nursing note reviewed.  Constitutional:      Appearance: She is well-developed.  HENT:     Head: Normocephalic and atraumatic.  Eyes:     Conjunctiva/sclera: Conjunctivae normal.  Cardiovascular:     Rate and Rhythm: Normal rate and regular rhythm.     Heart sounds: Normal heart sounds.  Pulmonary:     Effort: Pulmonary effort is normal.     Breath sounds: Normal breath sounds.  Abdominal:     General: Bowel sounds are normal.     Palpations: Abdomen is soft.  Musculoskeletal:     Cervical back: Normal range of motion.  Skin:    General: Skin is warm and dry.     Capillary Refill: Capillary refill takes less than  2 seconds.  Neurological:     Mental Status: She is alert and oriented to person, place, and time.  Psychiatric:        Behavior: Behavior normal.      Musculoskeletal Exam: Generalized hyperalgesia and positive tender points.  C-spine has limited range of motion without rotation.  Painful range of motion of the lumbar spine.  Shoulder joints have good range of motion with no discomfort.  PIP and DIP thickening.  Hip joints have good range of motion.  Painful range of motion of both knee joints with some warmth in the left knee.  Ankle joints have good range of motion with no joint tenderness.  CDAI Exam: CDAI Score: -- Patient Global: --; Provider Global: -- Swollen: --; Tender: -- Joint Exam 01/10/2023   No joint exam has been documented for this visit   There is currently no information documented on the homunculus. Go to the Rheumatology activity and complete the homunculus joint exam.  Investigation: No additional findings.  Imaging: VAS [place] LOWER EXTREMITY VENOUS REFLUX  Result Date: 01/09/2023  Lower Venous Reflux Study Patient Name:  MILIANY LINKE  Date of Exam:   01/08/2023 Medical Rec #: [id number]          Accession #:    [id number] Date of Birth: 1950-09-15         Patient Gender: F Patient Age:   64 years Exam Location:  [facility] Procedure:      VAS [place] LOWER EXTREMITY VENOUS REFLUX Referring Phys: Hart Rochester MALLIPEDDI --------------------------------------------------------------------------------  Other Indications: Patient presents with chronic lower extremity swelling and                    varicose veins, she has been diagnosed with venous                    insufficiency in the past. She wears compression stockings                    and is on Coumadin for history of PE. She ambulates with a                    walker due to chronic back and knee pain and osteoarthritis. Anticoagulation: Coumadin. Performing Technologist: Olegario Shearer RVT  Examination Guidelines: A complete evaluation includes B-mode imaging, spectral Doppler, color Doppler, and power Doppler as needed of all accessible portions of each vessel. Bilateral testing is considered an integral part of a complete examination. Limited examinations for reoccurring indications may be performed as noted. The reflux portion of the exam is performed with the patient in reverse Trendelenburg. Significant venous reflux is defined as >500 ms in the superficial venous system, and >1 second in the deep venous system.  Venous Reflux Times +--------------+---------+------+-----------+------------+--------+ RIGHT         Reflux NoRefluxReflux TimeDiameter cmsComments                         Yes                                  +--------------+---------+------+-----------+------------+--------+ CFV           no                                             +--------------+---------+------+-----------+------------+--------+  FV prox       no                                             +--------------+---------+------+-----------+------------+--------+ FV mid        no                                              +--------------+---------+------+-----------+------------+--------+ FV dist       no                                             +--------------+---------+------+-----------+------------+--------+ Popliteal     no                                             +--------------+---------+------+-----------+------------+--------+ GSV at [facility]    no                            0.64             +--------------+---------+------+-----------+------------+--------+ GSV prox thigh          yes    >500 ms      0.62             +--------------+---------+------+-----------+------------+--------+ GSV mid thigh           yes    >500 ms      0.53             +--------------+---------+------+-----------+------------+--------+ GSV dist thigh          yes    >500 ms      0.47             +--------------+---------+------+-----------+------------+--------+ GSV at knee             yes    >500 ms      0.42             +--------------+---------+------+-----------+------------+--------+ GSV mid calf            yes    >500 ms      0.31             +--------------+---------+------+-----------+------------+--------+ SSV prox calf no                            0.19             +--------------+---------+------+-----------+------------+--------+  +--------------+---------+------+-----------+------------+--------+ LEFT          Reflux NoRefluxReflux TimeDiameter cmsComments                         Yes                                  +--------------+---------+------+-----------+------------+--------+ CFV           no                                             +--------------+---------+------+-----------+------------+--------+  FV prox       no                                             +--------------+---------+------+-----------+------------+--------+ FV mid        no                                              +--------------+---------+------+-----------+------------+--------+ FV dist       no                                             +--------------+---------+------+-----------+------------+--------+ Popliteal     no                                             +--------------+---------+------+-----------+------------+--------+ GSV at [facility]    no                            0.77             +--------------+---------+------+-----------+------------+--------+ GSV prox thigh          yes    >500 ms      0.55             +--------------+---------+------+-----------+------------+--------+ GSV mid thigh           yes    >500 ms      0.54             +--------------+---------+------+-----------+------------+--------+ GSV dist thigh          yes    >500 ms      0.53             +--------------+---------+------+-----------+------------+--------+ GSV at knee             yes    >500 ms      0.42             +--------------+---------+------+-----------+------------+--------+ GSV mid calf            yes    >500 ms      0.39             +--------------+---------+------+-----------+------------+--------+ SSV prox calf no                            0.29             +--------------+---------+------+-----------+------------+--------+   Summary: Right: - No evidence of deep vein thrombosis seen in the right lower extremity, from the common femoral through the popliteal veins. - No evidence of superficial venous thrombosis in the right lower extremity. - Venous reflux is noted in the right greater saphenous vein in the thigh. - Venous reflux is noted in the right greater saphenous vein in the calf.  Left: - No evidence of deep vein thrombosis seen in the left lower extremity, from the common femoral through the popliteal veins. - No evidence of superficial venous thrombosis in the left lower extremity. -  Venous reflux is noted in the left greater saphenous vein in the thigh. -  Venous reflux is noted in the left greater saphenous vein in the calf.  Recommendations: VASCULAR CONSULT IS INDICATED.  *See table(s) above for measurements and observations. Electronically signed by Nanetta Batty MD on 01/09/2023 at 6:22:52 AM.    Final     Recent Labs: Lab Results  Component Value Date   WBC 7.0 06/26/2018   HGB 12.0 06/26/2018   PLT 317 06/26/2018   NA 141 08/16/2021   K 4.0 08/16/2021   CL 102 08/16/2021   CO2 31 08/16/2021   GLUCOSE 94 08/16/2021   BUN 10 08/16/2021   CREATININE 0.69 08/16/2021   BILITOT 0.9 06/26/2018   AST 18 06/26/2018   ALT 18 06/26/2018   PROT 6.7 06/26/2018   CALCIUM 8.5 (L) 08/16/2021   GFRAA >60 08/12/2018    Speciality Comments: No specialty comments available.  Procedures:  No procedures performed Allergies: Nitroglycerin, Other, Adhesive [tape], Codeine, and Latex   Assessment / Plan:     Visit Diagnoses: Pseudogout: Patient is taking colchicine 0.6 mg 1 tablet by mouth daily.  She is tolerating colchicine without any side effects.  She continues to have chronic pain in both knee joints especially the left knee.  She declined a cortisone injection at this time.  She declined scheduling Visco gel injections for both knees at this time.  She has an orthopedic consult scheduled on 01/18/2023 at which time she is planning to discuss possible surgery versus future injections. She does not require refill of colchicine at this time.  CBC and CMP were updated today.  She will follow-up in the office in 6 months or sooner if needed.  Medication monitoring encounter - CBC and CMP updated today.  Plan: CBC with Differential/Platelet, COMPLETE METABOLIC PANEL WITH GFR  Primary osteoarthritis of both hands: She has PIP and DIP thickening consistent with osteoarthritis of both hands.  No synovitis noted.  Chronic left shoulder pain: Good range of motion with no discomfort.  Chronic pain of left knee: Chronic pain.  Different treatment options  were discussed.  She declined a cortisone injection since she has been diagnosed with osteoporosis and is currently on Reclast.  Discussed the option of viscosupplementation but she has declined at this time.  Discussed referral to physical therapy but she has declined.  She is using a walker to assist with ambulation.  Discussed the importance of lower extremity muscle strengthening.  She has an upcoming appointment scheduled with orthopedics on 01/18/2023 to discuss her future treatment options.  Primary osteoarthritis of both knees: Chronic pain.  Left knee worse than right.  Using a walker to assist with ambulation.  Takes colchicine 0.6 mg 1 tablet daily for management of pseudogout.  Declined cortisone injections and Visco supplements at this time.  Patient has upcoming appointment with orthopedics on 01/18/2023 for further evaluation.  DDD (degenerative disc disease), cervical: Chronic pain.  DDD (degenerative disc disease), lumbar - Dr. Lorrine Kin and Dr. Maisie Fus.MRI of the lumbar spine on 04/20/2020 which revealed lumbar spondylosis, mild multilevel spondylolisthesis.  Chronic pain.  Fibromyalgia: She has generalized hyperalgesia and positive tender points on examination.  Discussed the importance of regular exercise and good sleep hygiene.  She continues take oxycodone for pain relief.  Osteopenia of multiple sites - DEXA: 10/29/19: BMD measured at forearm radius is 0.543 with T score -2.4.  Under care of PCP.  Started on Reclast infusion. Using walker to assist for ambulation.  Other medical conditions are listed as follows:  History of CHF (congestive heart failure)  History of COPD  History of pulmonary embolism  Chronic anticoagulation  History of glaucoma  History of sleep apnea    Orders: Orders Placed This Encounter  Procedures   CBC with Differential/Platelet   COMPLETE METABOLIC PANEL WITH GFR   No orders of the defined types were placed in this  encounter.    Follow-Up Instructions: Return in about 6 months (around 07/13/2023) for Pseudogout, Osteoarthritis.   Gearldine Bienenstock, PA-C  Note - This record has been created using Dragon software.  Chart creation errors have been sought, but may not always  have been located. Such creation errors do not reflect on  the standard of medical care.

## 2023-01-04 DIAGNOSIS — Z7901 Long term (current) use of anticoagulants: Secondary | ICD-10-CM | POA: Diagnosis not present

## 2023-01-07 DIAGNOSIS — E039 Hypothyroidism, unspecified: Secondary | ICD-10-CM | POA: Diagnosis not present

## 2023-01-07 DIAGNOSIS — E7849 Other hyperlipidemia: Secondary | ICD-10-CM | POA: Diagnosis not present

## 2023-01-07 DIAGNOSIS — I10 Essential (primary) hypertension: Secondary | ICD-10-CM | POA: Diagnosis not present

## 2023-01-07 DIAGNOSIS — M5459 Other low back pain: Secondary | ICD-10-CM | POA: Diagnosis not present

## 2023-01-07 DIAGNOSIS — Z0000 Encounter for general adult medical examination without abnormal findings: Secondary | ICD-10-CM | POA: Diagnosis not present

## 2023-01-07 DIAGNOSIS — G4739 Other sleep apnea: Secondary | ICD-10-CM | POA: Diagnosis not present

## 2023-01-07 DIAGNOSIS — J4520 Mild intermittent asthma, uncomplicated: Secondary | ICD-10-CM | POA: Diagnosis not present

## 2023-01-08 ENCOUNTER — Ambulatory Visit (HOSPITAL_COMMUNITY)
Admission: RE | Admit: 2023-01-08 | Discharge: 2023-01-08 | Disposition: A | Discharge status: Home / Self Care | Payer: 59 | Source: Ambulatory Visit | Attending: Cardiology | Admitting: Cardiology

## 2023-01-08 DIAGNOSIS — I872 Venous insufficiency (chronic) (peripheral): Secondary | ICD-10-CM | POA: Insufficient documentation

## 2023-01-10 ENCOUNTER — Encounter: Payer: Self-pay | Admitting: Physician Assistant

## 2023-01-10 ENCOUNTER — Ambulatory Visit: Payer: 59 | Attending: Rheumatology | Admitting: Physician Assistant

## 2023-01-10 VITALS — BP 117/82 | HR 62 | Resp 14 | Ht 59.0 in | Wt 175.8 lb

## 2023-01-10 DIAGNOSIS — M1120 Other chondrocalcinosis, unspecified site: Secondary | ICD-10-CM

## 2023-01-10 DIAGNOSIS — M19041 Primary osteoarthritis, right hand: Secondary | ICD-10-CM

## 2023-01-10 DIAGNOSIS — Z8679 Personal history of other diseases of the circulatory system: Secondary | ICD-10-CM | POA: Diagnosis not present

## 2023-01-10 DIAGNOSIS — M170 Bilateral primary osteoarthritis of knee: Secondary | ICD-10-CM

## 2023-01-10 DIAGNOSIS — M5030 Other cervical disc degeneration, unspecified cervical region: Secondary | ICD-10-CM | POA: Diagnosis not present

## 2023-01-10 DIAGNOSIS — M5136 Other intervertebral disc degeneration, lumbar region: Secondary | ICD-10-CM

## 2023-01-10 DIAGNOSIS — Z8669 Personal history of other diseases of the nervous system and sense organs: Secondary | ICD-10-CM

## 2023-01-10 DIAGNOSIS — Z86711 Personal history of pulmonary embolism: Secondary | ICD-10-CM | POA: Diagnosis not present

## 2023-01-10 DIAGNOSIS — Z8709 Personal history of other diseases of the respiratory system: Secondary | ICD-10-CM | POA: Diagnosis not present

## 2023-01-10 DIAGNOSIS — Z5181 Encounter for therapeutic drug level monitoring: Secondary | ICD-10-CM

## 2023-01-10 DIAGNOSIS — Z7901 Long term (current) use of anticoagulants: Secondary | ICD-10-CM

## 2023-01-10 DIAGNOSIS — M797 Fibromyalgia: Secondary | ICD-10-CM

## 2023-01-10 DIAGNOSIS — G8929 Other chronic pain: Secondary | ICD-10-CM

## 2023-01-10 DIAGNOSIS — M19042 Primary osteoarthritis, left hand: Secondary | ICD-10-CM

## 2023-01-10 DIAGNOSIS — M25512 Pain in left shoulder: Secondary | ICD-10-CM | POA: Diagnosis not present

## 2023-01-10 DIAGNOSIS — M8589 Other specified disorders of bone density and structure, multiple sites: Secondary | ICD-10-CM | POA: Diagnosis not present

## 2023-01-10 DIAGNOSIS — M25562 Pain in left knee: Secondary | ICD-10-CM | POA: Diagnosis not present

## 2023-01-10 NOTE — Patient Instructions (Signed)

## 2023-01-11 LAB — COMPLETE METABOLIC PANEL WITH GFR
AG Ratio: 1.6 (calc) (ref 1.0–2.5)
ALT: 16 U/L (ref 6–29)
AST: 22 U/L (ref 10–35)
Albumin: 4.1 g/dL (ref 3.6–5.1)
Alkaline phosphatase (APISO): 124 U/L (ref 37–153)
BUN: 7 mg/dL (ref 7–25)
CO2: 30 mmol/L (ref 20–32)
Calcium: 9.3 mg/dL (ref 8.6–10.4)
Chloride: 101 mmol/L (ref 98–110)
Creat: 0.79 mg/dL (ref 0.50–1.05)
Globulin: 2.6 g/dL (calc) (ref 1.9–3.7)
Glucose, Bld: 87 mg/dL (ref 65–99)
Potassium: 3.7 mmol/L (ref 3.5–5.3)
Sodium: 142 mmol/L (ref 135–146)
Total Bilirubin: 1.5 mg/dL — ABNORMAL HIGH (ref 0.2–1.2)
Total Protein: 6.7 g/dL (ref 6.1–8.1)
eGFR: 81 mL/min/{1.73_m2} (ref >=60)

## 2023-01-11 LAB — CBC WITH DIFFERENTIAL/PLATELET
Absolute Monocytes: 410 cells/uL (ref 200–950)
Basophils Absolute: 22 cells/uL (ref 0–200)
Basophils Relative: 0.4 %
Eosinophils Absolute: 81 cells/uL (ref 15–500)
Eosinophils Relative: 1.5 %
HCT: 35.9 % (ref 35.0–45.0)
Hemoglobin: 11.8 g/dL (ref 11.7–15.5)
Lymphs Abs: 1674 cells/uL (ref 850–3900)
MCH: 32.6 pg (ref 27.0–33.0)
MCHC: 32.9 g/dL (ref 32.0–36.0)
MCV: 99.2 fL (ref 80.0–100.0)
MPV: 10.1 fL (ref 7.5–12.5)
Monocytes Relative: 7.6 %
Neutro Abs: 3213 cells/uL (ref 1500–7800)
Neutrophils Relative %: 59.5 %
Platelets: 304 10*3/uL (ref 140–400)
RBC: 3.62 10*6/uL — ABNORMAL LOW (ref 3.80–5.10)
RDW: 14.3 % (ref 11.0–15.0)
Total Lymphocyte: 31 %
WBC: 5.4 10*3/uL (ref 3.8–10.8)

## 2023-01-11 NOTE — Progress Notes (Signed)
Total bilirubin is elevated-1.5. rest of CMP WNL. Please forward to PCP as requested.  RBC count is borderline low. Rest of CBC WNL.

## 2023-01-15 ENCOUNTER — Ambulatory Visit (HOSPITAL_COMMUNITY)
Admission: RE | Admit: 2023-01-15 | Discharge: 2023-01-15 | Disposition: A | Discharge status: Home / Self Care | Payer: 59 | Source: Ambulatory Visit | Attending: Internal Medicine | Admitting: Internal Medicine

## 2023-01-15 DIAGNOSIS — I5032 Chronic diastolic (congestive) heart failure: Secondary | ICD-10-CM

## 2023-01-15 LAB — ECHOCARDIOGRAM COMPLETE
Area-P 1/2: 4.44 cm2
Calc EF: 50.1 %
MV VTI: 1.82 cm2
S' Lateral: 2.9 cm
Single Plane A2C EF: 58 %
Single Plane A4C EF: 48.7 %

## 2023-01-15 NOTE — Progress Notes (Signed)
  Echocardiogram 2D Echocardiogram has been performed.  Dorothy Ford 01/15/2023, 12:44 PM

## 2023-01-16 ENCOUNTER — Other Ambulatory Visit (INDEPENDENT_AMBULATORY_CARE_PROVIDER_SITE_OTHER): Payer: Self-pay | Admitting: Gastroenterology

## 2023-01-16 ENCOUNTER — Telehealth: Payer: Self-pay | Admitting: Internal Medicine

## 2023-01-16 NOTE — Telephone Encounter (Signed)
Patient is requesting call back to go over results.  

## 2023-01-21 ENCOUNTER — Telehealth: Payer: Self-pay | Admitting: Internal Medicine

## 2023-01-21 NOTE — Telephone Encounter (Signed)
Pt calling to f/u on Echo results. Pt states to call her sister who will relay message to her and she'll callback. Pt states that her home phone is in the living room which takes her a while to get to, so she will return call. Please advise

## 2023-01-22 DIAGNOSIS — R2689 Other abnormalities of gait and mobility: Secondary | ICD-10-CM | POA: Diagnosis not present

## 2023-01-22 DIAGNOSIS — M129 Arthropathy, unspecified: Secondary | ICD-10-CM | POA: Diagnosis not present

## 2023-01-22 DIAGNOSIS — G5603 Carpal tunnel syndrome, bilateral upper limbs: Secondary | ICD-10-CM | POA: Diagnosis not present

## 2023-01-22 DIAGNOSIS — G8929 Other chronic pain: Secondary | ICD-10-CM | POA: Diagnosis not present

## 2023-01-22 NOTE — Telephone Encounter (Signed)
Results discussed with sister as patients phone is broken.PCP copied

## 2023-01-30 ENCOUNTER — Telehealth: Payer: Self-pay | Admitting: Rheumatology

## 2023-01-30 NOTE — Telephone Encounter (Signed)
Patient called stating she would like to apply for gel injections for her knee pain.

## 2023-01-31 NOTE — Telephone Encounter (Signed)
VOB submitted for Euflexxa, Bilateral knee(s) BV pending 

## 2023-02-01 DIAGNOSIS — Z7901 Long term (current) use of anticoagulants: Secondary | ICD-10-CM | POA: Diagnosis not present

## 2023-02-22 DIAGNOSIS — Z7901 Long term (current) use of anticoagulants: Secondary | ICD-10-CM | POA: Diagnosis not present

## 2023-02-28 ENCOUNTER — Encounter (INDEPENDENT_AMBULATORY_CARE_PROVIDER_SITE_OTHER): Payer: Self-pay | Admitting: Gastroenterology

## 2023-02-28 ENCOUNTER — Ambulatory Visit (INDEPENDENT_AMBULATORY_CARE_PROVIDER_SITE_OTHER): Payer: 59 | Admitting: Gastroenterology

## 2023-02-28 VITALS — BP 108/73 | HR 74 | Ht 59.0 in | Wt 178.4 lb

## 2023-02-28 DIAGNOSIS — K219 Gastro-esophageal reflux disease without esophagitis: Secondary | ICD-10-CM | POA: Diagnosis not present

## 2023-02-28 DIAGNOSIS — K6289 Other specified diseases of anus and rectum: Secondary | ICD-10-CM

## 2023-02-28 DIAGNOSIS — K640 First degree hemorrhoids: Secondary | ICD-10-CM | POA: Diagnosis not present

## 2023-02-28 NOTE — Patient Instructions (Addendum)
Continue protonix 40mg  daily and famotidine 20mg  at bedtime  Make sure you have Good water intake, diet high in fruits, veggies, whole grains (kiwi and prunes are very good for constipation) Can use witch hazel wipes (hemorrhoid wipes) and can try over the counter desitin for rectal irritation, please let me know if this does not improve  Follow up in 6 months   It was a pleasure to see you today. I want to create trusting relationships with patients and provide genuine, compassionate, and quality care. I truly value your feedback! please be on the lookout for a survey regarding your visit with me today. I appreciate your input about our visit and your time in completing this!    Dorothy Ford L. Jeanmarie Hubert, MSN, APRN, AGNP-C Adult-Gerontology Nurse Practitioner [facility] at [facility]

## 2023-02-28 NOTE — Progress Notes (Addendum)
Referring Provider: Toma Deiters, MD Primary Care Physician:  Toma Deiters, MD Primary GI Physician: Levon Hedger   Chief Complaint  Patient presents with   Hemorrhoids    Follow up on hemorrhoids. Having some rectal irritation. Using hydrocortisone cream as needed.    HPI:   Dorothy Ford is a 72 y.o. female with past medical history of asthma, DDD, CHF, depression, diaphragmatic hernia, fibromyalgia, high cholesterol, RA, neuropathy, OA, thyroid disease.   Patient presenting today for follow up of hemorrhoids and GERD   Last seen July 2023, at that time having a BM a few times per week. Having some stomach upset/bloating on occasion and nausea. Some fecal urgency. One episode of previous rectal bleeding that resolved with hemorrhoid cream. History of GERD, taking famotidine 40mg  daily, dysphagia with larger pills.   Recommended to continue famotidine 20mg  at bedtime, start protonix 40mg  daily, bentyl 10mg  BID, reflux precautions.  Present:  Patient notes she had a decrease in her appetite a few months ago, she went to her rheumatologist and had some blood work done. She notes her red blood count was slightly low. Her appetite has since returned and she feels she is eating better.   Denies any abdominal discomfort currently. She takes bentyl on occasion if she has abdominal pain but has not needed it in a while. Denies diarrhea. She has some small stool balls at times though having 2-3 BMs per day. She does use some fiber which she feels keeps her bowels moving. She has some "rectal irritation" Feels that the skin around her perianal area feels raw. She is not using any wet wipes regularly. Denies rectal bleeding. She is using hemorrhoid cream as needed which helps some. She has some witch hazel wipes but has not used these yet. Denies rectal pain or burning.   GERD is well controlled for the most part on protonix 40mg  daily and famotidine 20mg  at bedtime. She feels symptoms have  improved with addition of protonix 40mg  daily. She has some mucus in her throat from post nasal drip which will sometimes make her GERD flare up but states this is rare.   Last Colonoscopy:Dec 2022Four 3 to 8 mm polyps in the transverse colon and in the ascending colon,  Three 3 to 8 mm polyps in the rectum, in the sigmoid colon and in the descending colon, - Non-bleeding internal hemorrhoids Last Endoscopy:unsure of timing, was r/t abdominal pain   Past Medical History:  Diagnosis Date   Acid reflux    Asthma    Cardiac dysrhythmia, unspecified    Carotid bruit    Bilateral   Cataract    Chest pain, unspecified    Chronic airway obstruction, not elsewhere classified    Chronic sinusitis    Congestive heart failure, unspecified    DDD (degenerative disc disease), cervical 07/24/2016   DDD (degenerative disc disease), lumbar 07/24/2016   Depressive disorder, not elsewhere classified    Diaphragmatic hernia without mention of obstruction or gangrene    Edema    left leg   Fibromyalgia    Hemorrhoid    High cholesterol    History of rheumatoid arthritis    Hypothyroidism    Neuropathy    Obesity, unspecified    Obstructive lung disease (generalized) (HCC)    Osteoarthritis of both hands 07/24/2016   Osteopenia    Osteoporosis 07/24/2016   Palpitations    Pseudogout involving multiple joints    Sleep apnea    Sleep apnea  per patient   SVT (supraventricular tachycardia)    Syncope    admitted 09/2011   Thyroid disease    Unspecified essential hypertension     Past Surgical History:  Procedure Laterality Date   CARPAL TUNNEL RELEASE Bilateral    CATARACT EXTRACTION, BILATERAL     11/02/2021, 11/23/2021   CATHETER ABLATION  02/01/1997   @ [facility]   COLONOSCOPY WITH PROPOFOL N/A 08/18/2021   multiple adenomas. non-bleeding internal hemorrhoids.   INCONTINENCE SURGERY     POLYPECTOMY  08/18/2021   Procedure: POLYPECTOMY;  Surgeon: Marguerita Merles, Reuel Boom, MD;   Location: [facility];  Service: Gastroenterology;;   removal toenail Bilateral    great toes   VEIN SURGERY BLADDER      Current Outpatient Medications  Medication Sig Dispense Refill   albuterol (PROVENTIL HFA;VENTOLIN HFA) 108 (90 Base) MCG/ACT inhaler Inhale 2 puffs into the lungs every 6 (six) hours as needed for wheezing or shortness of breath.     amLODipine (NORVASC) 5 MG tablet Take 5 mg by mouth daily.     ascorbic acid (VITAMIN C) 500 MG tablet Take 500 mg by mouth daily.     atorvastatin (LIPITOR) 40 MG tablet Take 40 mg by mouth daily.     azelastine (ASTELIN) 0.1 % nasal spray Place 1 spray into both nostrils daily.     Calcium Carb-Cholecalciferol (CALCIUM 600 + D PO) Take 1 tablet by mouth daily.     clonazePAM (KLONOPIN) 0.5 MG tablet Take 0.5 mg by mouth at bedtime.      COLCRYS 0.6 MG tablet TAKE 1 TABLET BY MOUTH ONCE DAILY. 30 tablet 0   cycloSPORINE (RESTASIS) 0.05 % ophthalmic emulsion Place 1 drop into both eyes 2 (two) times daily.     dicyclomine (BENTYL) 10 MG capsule TAKE 1 CAPSULE BY MOUTH 3 TIMES DAILY AS NEEDED FOR SPASMS. 90 capsule 0   Elastic Bandages & Supports (MEDICAL COMPRESSION STOCKINGS) MISC 1 each by Does not apply route as directed. 1 pair low pressure knee high compression stockings Dx: leg edema 1 each 0   famotidine (PEPCID) 20 MG tablet Take 20 mg by mouth 2 (two) times daily.     fexofenadine (ALLEGRA) 180 MG tablet Take 180 mg by mouth daily.     furosemide (LASIX) 40 MG tablet TAKE 1 TABLET BY MOUTH ONCE DAILY. 30 tablet 6   gabapentin (NEURONTIN) 300 MG capsule Take 300 mg by mouth 3 (three) times daily.      guaiFENesin (MUCINEX) 600 MG 12 hr tablet Take 600 mg by mouth 2 (two) times daily as needed for to loosen phlegm.     hydrocortisone (ANUSOL-HC) 2.5 % rectal cream Place 1 application rectally 2 (two) times daily. Twice daily x 10 days then up to twice daily as needed for itching/irritation from hemorrhoids 30 g 1    ipratropium-albuterol (DUONEB) 0.5-2.5 (3) MG/3ML SOLN Take 3 mLs by nebulization every 6 (six) hours as needed.      levalbuterol (XOPENEX HFA) 45 MCG/ACT inhaler Inhale 2 puffs into the lungs every 6 (six) hours as needed for wheezing.     levothyroxine (SYNTHROID, LEVOTHROID) 50 MCG tablet Take 50 mcg by mouth daily before breakfast.      loperamide (IMODIUM A-D) 2 MG tablet Take 2 mg by mouth 4 (four) times daily as needed for diarrhea or loose stools.     metoprolol tartrate (LOPRESSOR) 25 MG tablet Take 25 mg by mouth daily.     montelukast (SINGULAIR) 10  MG tablet Take 1 tablet by mouth daily.     mupirocin ointment (BACTROBAN) 2 % Apply 1 application  topically as needed.     naloxone (NARCAN) 4 MG/0.1ML LIQD nasal spray kit Narcan 4 mg/actuation nasal spray     nystatin (MYCOSTATIN) powder Apply topically as needed.     ondansetron (ZOFRAN) 4 MG tablet as needed.      oxyCODONE (OXY IR/ROXICODONE) 5 MG immediate release tablet as needed.      pantoprazole (PROTONIX) 40 MG tablet TAKE 1 TABLET DAILY BEFORE BREAKFAST. 30 tablet 1   PARoxetine (PAXIL) 30 MG tablet Take 30 mg by mouth daily.     potassium chloride (K-DUR) 10 MEQ tablet Take 3 tablets (30 mEq total) by mouth daily. 90 tablet 6   Sennosides (SENOKOT PO) Take by mouth in the morning and at bedtime.      Simethicone 125 MG CAPS Take 2 capsules by mouth as needed.     SYMBICORT 160-4.5 MCG/ACT inhaler Inhale 2 puffs into the lungs 2 (two) times daily.     warfarin (COUMADIN) 2 MG tablet Take 2 mg by mouth as directed. Managed by PCP     Zoledronic Acid (RECLAST IV) Inject into the vein. Dose: 08/08/2022 takes once a year     No current facility-administered medications for this visit.    Allergies as of 02/28/2023 - Review Complete 02/28/2023  Allergen Reaction Noted   Nitroglycerin  05/16/2021   Other  08/14/2021   Adhesive [tape] Rash 10/09/2011   Codeine Nausea And Vomiting    Latex Rash 05/16/2021    Family History   Problem Relation Age of Onset   Heart disease Mother    Hypertension Mother    Stroke Other    Heart disease Father    Hypertension Father    Kidney failure Sister    Asthma Sister     Social History   Socioeconomic History   Marital status: Single    Spouse name: Not on file   Number of children: Not on file   Years of education: Not on file   Highest education level: Not on file  Occupational History   Not on file  Tobacco Use   Smoking status: Never    Passive exposure: Current   Smokeless tobacco: Never  Vaping Use   Vaping Use: Never used  Substance and Sexual Activity   Alcohol use: No    Alcohol/week: 0.0 standard drinks of alcohol   Drug use: Never   Sexual activity: Not Currently  Other Topics Concern   Not on file  Social History Narrative   Not on file   Social Determinants of Health   Financial Resource Strain: Not on file  Food Insecurity: Not on file  Transportation Needs: Not on file  Physical Activity: Not on file  Stress: Not on file  Social Connections: Not on file   Review of systems General: negative for malaise, night sweats, fever, chills, weight loss Neck: Negative for lumps, goiter, pain and significant neck swelling Resp: Negative for cough, wheezing, dyspnea at rest CV: Negative for chest pain, leg swelling, palpitations, orthopnea GI: denies melena, hematochezia, nausea, vomiting, diarrhea, constipation, dysphagia, odyonophagia, early satiety or unintentional weight loss. +rectal irritation/rawness  MSK: Negative for joint pain or swelling, back pain, and muscle pain. Derm: Negative for itching or rash Psych: Denies depression, anxiety, memory loss, confusion. No homicidal or suicidal ideation.  Heme: Negative for prolonged bleeding, bruising easily, and swollen nodes. Endocrine: Negative for cold  or heat intolerance, polyuria, polydipsia and goiter. Neuro: negative for tremor, gait imbalance, syncope and seizures. The remainder of  the review of systems is noncontributory.  Physical Exam: BP 108/73 (BP Location: Left Arm, Patient Position: Sitting, Cuff Size: Large)   Pulse 74   Ht 4\' 11"  (1.499 m)   Wt 178 lb 6.4 oz (80.9 kg)   BMI 36.03 kg/m  General:   Alert and oriented. No distress noted. Pleasant and cooperative.  Head:  Normocephalic and atraumatic. Eyes:  Conjuctiva clear without scleral icterus. Mouth:  Oral mucosa pink and moist. Good dentition. No lesions. Heart: Normal rate and rhythm, s1 and s2 heart sounds present.  Lungs: Clear lung sounds in all lobes. Respirations equal and unlabored. Abdomen:  +BS, soft, non-tender and non-distended. No rebound or guarding. No HSM or masses noted. Derm: No palmar erythema or jaundice Msk:  Symmetrical without gross deformities. Normal posture. Extremities:  Without edema. Neurologic:  Alert and  oriented x4 Psych:  Alert and cooperative. Normal mood and affect.  Invalid input(s): "6 MONTHS"   ASSESSMENT: ASHANTEE DEUPREE is a 72 y.o. female presenting today for follow up of GERD and hemorrhoids   GERD: well managed on protonix 40mg  daily famotidine 20mg  at night. Will continue with current regimen and good reflux precautions.   Perianal irritation/hemorrhoids: history of grade 1 hemorrhoids, treated with topical medication in the past with adequate results. She notes some irritation in perianal area, no itching burning or bleeding. She has some hemorrhoid cream that helps some, she has not tried witch hazel wipes but has some on had. Recommend to use witch hazel wipes, continue with hemorrhoid cream, avoid harsh soaps/wet wipes in this area, clean with warm water only, can use otc desitin topically. She will make me aware if this does not improve.    PLAN:  Continue protonix 40mg  daily and famotidine 20mg  at bedtime  2. Good water intake, diet high in fruits, veggies, whole grains  3. Can use witch hazel wipes/otc desitin for rectal irritation 4.  Hemorrhoid cream PRN  5. Aim for atleast 64 oz water per day, diet high in fruits, veggies, whole grains  All questions were answered, patient verbalized understanding and is in agreement with plan as outlined above.    Follow Up: 6 months   Dorothy Ford L. Dorothy Hubert, MSN, APRN, AGNP-C Adult-Gerontology Nurse Practitioner [facility]  I have reviewed the note and agree with the APP's assessment as described in this progress note  Katrinka Blazing, MD Gastroenterology and Hepatology [facility] Gastroenterology

## 2023-03-15 DIAGNOSIS — Z7901 Long term (current) use of anticoagulants: Secondary | ICD-10-CM | POA: Diagnosis not present

## 2023-03-18 NOTE — Telephone Encounter (Signed)
Please call to schedule visco injections.  Approved for Synvisc, Bilateral knee(s). [employer] Stable Deductible has been met Patient is responsible for 20% coinsurance Once OOP has been met ($8850/$592.95), patient is covered at 100% Authorization #[id number] Approval dates 03/18/2023 to 03/17/2024

## 2023-03-20 ENCOUNTER — Other Ambulatory Visit (INDEPENDENT_AMBULATORY_CARE_PROVIDER_SITE_OTHER): Payer: Self-pay | Admitting: Gastroenterology

## 2023-03-20 NOTE — Telephone Encounter (Signed)
LMOM for patient to call and schedule Synvisc injections °

## 2023-03-22 DIAGNOSIS — M797 Fibromyalgia: Secondary | ICD-10-CM | POA: Diagnosis not present

## 2023-03-22 DIAGNOSIS — I509 Heart failure, unspecified: Secondary | ICD-10-CM | POA: Diagnosis not present

## 2023-03-22 DIAGNOSIS — Z7951 Long term (current) use of inhaled steroids: Secondary | ICD-10-CM | POA: Diagnosis not present

## 2023-03-22 DIAGNOSIS — J449 Chronic obstructive pulmonary disease, unspecified: Secondary | ICD-10-CM | POA: Diagnosis not present

## 2023-03-22 DIAGNOSIS — Z91040 Latex allergy status: Secondary | ICD-10-CM | POA: Diagnosis not present

## 2023-03-22 DIAGNOSIS — Z86711 Personal history of pulmonary embolism: Secondary | ICD-10-CM | POA: Diagnosis not present

## 2023-03-22 DIAGNOSIS — E039 Hypothyroidism, unspecified: Secondary | ICD-10-CM | POA: Diagnosis not present

## 2023-03-22 DIAGNOSIS — M109 Gout, unspecified: Secondary | ICD-10-CM | POA: Diagnosis not present

## 2023-03-22 DIAGNOSIS — Z79899 Other long term (current) drug therapy: Secondary | ICD-10-CM | POA: Diagnosis not present

## 2023-03-22 DIAGNOSIS — Z885 Allergy status to narcotic agent status: Secondary | ICD-10-CM | POA: Diagnosis not present

## 2023-03-22 DIAGNOSIS — M1990 Unspecified osteoarthritis, unspecified site: Secondary | ICD-10-CM | POA: Diagnosis not present

## 2023-03-22 DIAGNOSIS — F32A Depression, unspecified: Secondary | ICD-10-CM | POA: Diagnosis not present

## 2023-03-22 DIAGNOSIS — E785 Hyperlipidemia, unspecified: Secondary | ICD-10-CM | POA: Diagnosis not present

## 2023-03-22 DIAGNOSIS — Z91048 Other nonmedicinal substance allergy status: Secondary | ICD-10-CM | POA: Diagnosis not present

## 2023-03-22 DIAGNOSIS — L02213 Cutaneous abscess of chest wall: Secondary | ICD-10-CM | POA: Diagnosis not present

## 2023-03-22 DIAGNOSIS — I110 Hypertensive heart disease with heart failure: Secondary | ICD-10-CM | POA: Diagnosis not present

## 2023-03-27 DIAGNOSIS — M9903 Segmental and somatic dysfunction of lumbar region: Secondary | ICD-10-CM | POA: Diagnosis not present

## 2023-03-27 DIAGNOSIS — M9901 Segmental and somatic dysfunction of cervical region: Secondary | ICD-10-CM | POA: Diagnosis not present

## 2023-03-27 DIAGNOSIS — M9902 Segmental and somatic dysfunction of thoracic region: Secondary | ICD-10-CM | POA: Diagnosis not present

## 2023-03-27 DIAGNOSIS — M62830 Muscle spasm of back: Secondary | ICD-10-CM | POA: Diagnosis not present

## 2023-03-27 DIAGNOSIS — M542 Cervicalgia: Secondary | ICD-10-CM | POA: Diagnosis not present

## 2023-03-27 DIAGNOSIS — M546 Pain in thoracic spine: Secondary | ICD-10-CM | POA: Diagnosis not present

## 2023-03-28 NOTE — Telephone Encounter (Signed)
No voicemail on home & cell / unable to leave message to schedule injections

## 2023-03-29 DIAGNOSIS — Z7901 Long term (current) use of anticoagulants: Secondary | ICD-10-CM | POA: Diagnosis not present

## 2023-04-03 DIAGNOSIS — L02213 Cutaneous abscess of chest wall: Secondary | ICD-10-CM | POA: Diagnosis not present

## 2023-04-03 DIAGNOSIS — I10 Essential (primary) hypertension: Secondary | ICD-10-CM | POA: Diagnosis not present

## 2023-04-04 NOTE — Telephone Encounter (Signed)
No voicemail on home & cell.  Left message on Susan's voicemail (patient's sister and emergency contact).

## 2023-04-17 ENCOUNTER — Telehealth: Payer: Self-pay | Admitting: *Deleted

## 2023-04-17 NOTE — Patient Outreach (Signed)
  Care Coordination   04/17/2023 Name: Dorothy Ford MRN: [id number] DOB: 18-Oct-1950   Care Coordination Outreach Attempts:  An unsuccessful telephone outreach was attempted today to offer the patient information about available care coordination services.  Follow Up Plan:  Additional outreach attempts will be made to offer the patient care coordination information and services.   Encounter Outcome:  No Answer   Care Coordination Interventions:  No, not indicated    Demetrios Loll, BSN, RN-BC RN Care Coordinator [facility]  [facility] Direct Dial: [phone] Main #: [phone]

## 2023-05-15 ENCOUNTER — Ambulatory Visit: Payer: 59 | Admitting: Physician Assistant

## 2023-05-15 DIAGNOSIS — M170 Bilateral primary osteoarthritis of knee: Secondary | ICD-10-CM

## 2023-05-22 ENCOUNTER — Ambulatory Visit: Payer: 59 | Attending: Physician Assistant | Admitting: Physician Assistant

## 2023-05-22 DIAGNOSIS — M170 Bilateral primary osteoarthritis of knee: Secondary | ICD-10-CM | POA: Diagnosis not present

## 2023-05-22 MED ORDER — HYLAN G-F 20 16 MG/2ML IX SOSY
16.0000 mg | PREFILLED_SYRINGE | INTRA_ARTICULAR | Status: AC | PRN
Start: 2023-05-22 — End: 2023-05-22
  Administered 2023-05-22: 16 mg via INTRA_ARTICULAR

## 2023-05-22 MED ORDER — LIDOCAINE HCL 1 % IJ SOLN
1.5000 mL | INTRAMUSCULAR | Status: AC | PRN
Start: 2023-05-22 — End: 2023-05-22
  Administered 2023-05-22: 1.5 mL

## 2023-05-22 NOTE — Progress Notes (Signed)
Procedure Note  Patient: Dorothy Ford             Date of Birth: 1951-06-01           MRN: [id number]             Visit Date: 05/22/2023  Procedures: Visit Diagnoses:   Synvisc #1 bilateral knees, B/B 1. Primary osteoarthritis of both knees     Large Joint Inj: bilateral knee on 05/22/2023 8:47 AM Indications: pain Details: 25 G 1.5 in needle, medial approach  Arthrogram: No  Medications (Right): 1.5 mL lidocaine 1 %; 16 mg hylan 16 MG/2ML Aspirate (Right): 0 mL Medications (Left): 1.5 mL lidocaine 1 %; 16 mg hylan 16 MG/2ML Aspirate (Left): 0 mL Outcome: tolerated well, no immediate complications Procedure, treatment alternatives, risks and benefits explained, specific risks discussed. Consent was given by the patient. Immediately prior to procedure a time out was called to verify the correct patient, procedure, equipment, support staff and site/side marked as required. Patient was prepped and draped in the usual sterile fashion.      Patient tolerated the procedures well.  Procedure notes were completed above.  Aftercare was discussed. Sherron Ales, PA-C

## 2023-05-29 ENCOUNTER — Ambulatory Visit: Payer: 59 | Attending: Physician Assistant | Admitting: Physician Assistant

## 2023-05-29 DIAGNOSIS — M170 Bilateral primary osteoarthritis of knee: Secondary | ICD-10-CM | POA: Diagnosis not present

## 2023-05-29 MED ORDER — LIDOCAINE HCL 1 % IJ SOLN
1.5000 mL | INTRAMUSCULAR | Status: AC | PRN
Start: 2023-05-29 — End: 2023-05-29
  Administered 2023-05-29: 1.5 mL

## 2023-05-29 MED ORDER — HYLAN G-F 20 16 MG/2ML IX SOSY
16.0000 mg | PREFILLED_SYRINGE | INTRA_ARTICULAR | Status: AC | PRN
Start: 2023-05-29 — End: 2023-05-29
  Administered 2023-05-29: 16 mg via INTRA_ARTICULAR

## 2023-05-29 NOTE — Progress Notes (Signed)
Procedure Note  Patient: Dorothy Ford             Date of Birth: 12/22/1950           MRN: [id number]             Visit Date: 05/29/2023  Procedures: Visit Diagnoses:  1. Primary osteoarthritis of both knees    Synvisc #2 bilateral knees, B/B Large Joint Inj: bilateral knee on 05/29/2023 8:44 AM Indications: pain Details: 25 G 1.5 in needle, medial approach  Arthrogram: No  Medications (Right): 1.5 mL lidocaine 1 %; 16 mg hylan 16 MG/2ML Aspirate (Right): 0 mL Medications (Left): 1.5 mL lidocaine 1 %; 16 mg hylan 16 MG/2ML Aspirate (Left): 0 mL Outcome: tolerated well, no immediate complications Procedure, treatment alternatives, risks and benefits explained, specific risks discussed. Consent was given by the patient. Immediately prior to procedure a time out was called to verify the correct patient, procedure, equipment, support staff and site/side marked as required. Patient was prepped and draped in the usual sterile fashion.     Patient tolerated the procedures well. Aftercare was discussed.  Sherron Ales, PA-C

## 2023-06-05 ENCOUNTER — Ambulatory Visit: Payer: 59 | Attending: Physician Assistant | Admitting: Physician Assistant

## 2023-06-05 DIAGNOSIS — M170 Bilateral primary osteoarthritis of knee: Secondary | ICD-10-CM

## 2023-06-05 MED ORDER — LIDOCAINE HCL 1 % IJ SOLN
1.5000 mL | INTRAMUSCULAR | Status: AC | PRN
Start: 2023-06-05 — End: 2023-06-05
  Administered 2023-06-05: 1.5 mL

## 2023-06-05 MED ORDER — HYLAN G-F 20 16 MG/2ML IX SOSY
16.0000 mg | PREFILLED_SYRINGE | INTRA_ARTICULAR | Status: AC | PRN
Start: 2023-06-05 — End: 2023-06-05
  Administered 2023-06-05: 16 mg via INTRA_ARTICULAR

## 2023-06-05 NOTE — Progress Notes (Signed)
Procedure Note  Patient: Dorothy Ford             Date of Birth: 12-27-1950           MRN: [id number]             Visit Date: 06/05/2023  Procedures: Visit Diagnoses:  1. Primary osteoarthritis of both knees    Synvisc #3 bilateral knees, B/B Large Joint Inj: bilateral knee on 06/05/2023 11:29 AM Indications: pain Details: 25 G 1.5 in needle, medial approach  Arthrogram: No  Medications (Right): 1.5 mL lidocaine 1 %; 16 mg hylan 16 MG/2ML Aspirate (Right): 0 mL Medications (Left): 1.5 mL lidocaine 1 %; 16 mg hylan 16 MG/2ML Aspirate (Left): 0 mL Outcome: tolerated well, no immediate complications Procedure, treatment alternatives, risks and benefits explained, specific risks discussed. Consent was given by the patient. Immediately prior to procedure a time out was called to verify the correct patient, procedure, equipment, support staff and site/side marked as required. Patient was prepped and draped in the usual sterile fashion.    Patient tolerated the procedures well.  Aftercare was discussed. Sherron Ales, PA-C

## 2023-06-10 ENCOUNTER — Ambulatory Visit: Payer: 59 | Admitting: Orthopaedic Surgery

## 2023-06-10 ENCOUNTER — Other Ambulatory Visit (INDEPENDENT_AMBULATORY_CARE_PROVIDER_SITE_OTHER): Payer: 59

## 2023-06-10 VITALS — Ht 59.0 in | Wt 174.0 lb

## 2023-06-10 DIAGNOSIS — M25551 Pain in right hip: Secondary | ICD-10-CM | POA: Diagnosis not present

## 2023-06-10 DIAGNOSIS — M1611 Unilateral primary osteoarthritis, right hip: Secondary | ICD-10-CM | POA: Insufficient documentation

## 2023-06-10 NOTE — Progress Notes (Signed)
The patient is a 72 year old female that I am seeing for the first time.  She is sent to me due to debilitating right hip pain has been getting worse for several months now.  The pain is in the groin but radiates into her thigh.  She does ambulate using a rolling walker.  She has known arthritis in her knees.  She has a complex medical history.  She is on Coumadin secondary to atrial fibrillation.  Her BMI is 35.14.  She sees her primary care physician on a regular basis and is scheduled to see her cardiologist she said later this month.  She is also on chronic oxycodone and Neurontin.  I was able to review all of her medications and past medical history as well as other notes within epic.   On exam I can put both hips through internal and external rotation with no blocks to rotation but she does have more pain with her right hip with slight stiffness.  An AP pelvis and lateral of the right hip shows significant arthritis of the right hip with close to bone-on-bone wear as well as cystic changes and osteophytes around the right hip.  There are sclerotic changes well.  There is narrowing of the left hip joint.  We had a long and thorough discussion about hip replacement surgery.  I gave her a handout about hip replacement surgery.  I talked about what to expect from an intraoperative and postoperative standpoint.  She will certainly need clearance from her primary care physician and her cardiologist because we would need her to be off of Coumadin for about 5 days prior to surgery.  She would also like to think about this as well so she has our surgery scheduler's card and will let us know.  All questions and concerns were addressed and answered.

## 2023-06-12 ENCOUNTER — Ambulatory Visit: Payer: 59 | Admitting: Internal Medicine

## 2023-06-20 ENCOUNTER — Encounter: Payer: Self-pay | Admitting: Internal Medicine

## 2023-06-20 ENCOUNTER — Ambulatory Visit: Payer: 59 | Attending: Internal Medicine | Admitting: Internal Medicine

## 2023-06-20 VITALS — BP 138/82 | HR 61 | Ht 59.0 in | Wt 174.4 lb

## 2023-06-20 DIAGNOSIS — Z01810 Encounter for preprocedural cardiovascular examination: Secondary | ICD-10-CM | POA: Insufficient documentation

## 2023-06-20 DIAGNOSIS — I5032 Chronic diastolic (congestive) heart failure: Secondary | ICD-10-CM | POA: Diagnosis not present

## 2023-06-20 DIAGNOSIS — I471 Supraventricular tachycardia, unspecified: Secondary | ICD-10-CM

## 2023-06-20 DIAGNOSIS — Z79899 Other long term (current) drug therapy: Secondary | ICD-10-CM

## 2023-06-20 MED ORDER — EMPAGLIFLOZIN 10 MG PO TABS: 10.0000 mg | ORAL_TABLET | Freq: Every day | ORAL | 11 refills | Status: AC

## 2023-06-20 NOTE — Progress Notes (Signed)
Cardiology Office Note  Date: 06/20/2023   ID: TRANELL TUELL, DOB 1951/05/12, MRN [id number]  PCP:  Toma Deiters, MD  Cardiologist:  Marjo Bicker, MD Electrophysiologist:  None    History of Present Illness: Dorothy Ford is a 72 y.o. female known to have paroxysmal SVT status post SVT ablation 1998, chronic diastolic heart failure, history of PE on chronic anticoagulation, HTN, chronic venous insufficiency is here for follow-up visit.  Patient was last seen in 2022. In the interim, she had repeat COVID infection in 09/2022, had cellulitis in 2023.  Overall doing great, has hip arthritis and knee arthritis for which she wanted to obtain cardiac clearance.  Limited ambulation due to hip pain.  No angina, DOE.  No dizziness, presyncope or syncope.  Has some palpitations.  Past Medical History:  Diagnosis Date   Acid reflux    Asthma    Cardiac dysrhythmia, unspecified    Carotid bruit    Bilateral   Cataract    Chest pain, unspecified    Chronic airway obstruction, not elsewhere classified    Chronic sinusitis    Congestive heart failure, unspecified    DDD (degenerative disc disease), cervical 07/24/2016   DDD (degenerative disc disease), lumbar 07/24/2016   Depressive disorder, not elsewhere classified    Diaphragmatic hernia without mention of obstruction or gangrene    Edema    left leg   Fibromyalgia    Hemorrhoid    High cholesterol    History of rheumatoid arthritis    Hypothyroidism    Neuropathy    Obesity, unspecified    Obstructive lung disease (generalized) (HCC)    Osteoarthritis of both hands 07/24/2016   Osteopenia    Osteoporosis 07/24/2016   Palpitations    Pseudogout involving multiple joints    Sleep apnea    Sleep apnea    per patient   SVT (supraventricular tachycardia)    Syncope    admitted 09/2011   Thyroid disease    Unspecified essential hypertension     Past Surgical History:  Procedure Laterality Date   CARPAL  TUNNEL RELEASE Bilateral    CATARACT EXTRACTION, BILATERAL     11/02/2021, 11/23/2021   CATHETER ABLATION  02/01/1997   @ [facility]   COLONOSCOPY WITH PROPOFOL N/A 08/18/2021   multiple adenomas. non-bleeding internal hemorrhoids.   INCONTINENCE SURGERY     POLYPECTOMY  08/18/2021   Procedure: POLYPECTOMY;  Surgeon: Marguerita Merles, Reuel Boom, MD;  Location: [facility];  Service: Gastroenterology;;   removal toenail Bilateral    great toes   VEIN SURGERY BLADDER      Current Outpatient Medications  Medication Sig Dispense Refill   albuterol (PROVENTIL HFA;VENTOLIN HFA) 108 (90 Base) MCG/ACT inhaler Inhale 2 puffs into the lungs every 6 (six) hours as needed for wheezing or shortness of breath.     amLODipine (NORVASC) 5 MG tablet Take 5 mg by mouth daily.     ascorbic acid (VITAMIN C) 500 MG tablet Take 500 mg by mouth daily.     atorvastatin (LIPITOR) 40 MG tablet Take 40 mg by mouth daily.     azelastine (ASTELIN) 0.1 % nasal spray Place 1 spray into both nostrils daily.     Calcium Carb-Cholecalciferol (CALCIUM 600 + D PO) Take 1 tablet by mouth daily.     clonazePAM (KLONOPIN) 0.5 MG tablet Take 0.5 mg by mouth at bedtime.      COLCRYS 0.6 MG tablet TAKE 1 TABLET BY MOUTH ONCE DAILY.  30 tablet 0   cycloSPORINE (RESTASIS) 0.05 % ophthalmic emulsion Place 1 drop into both eyes 2 (two) times daily.     dicyclomine (BENTYL) 10 MG capsule TAKE 1 CAPSULE BY MOUTH 3 TIMES DAILY AS NEEDED FOR SPASMS. 90 capsule 0   Elastic Bandages & Supports (MEDICAL COMPRESSION STOCKINGS) MISC 1 each by Does not apply route as directed. 1 pair low pressure knee high compression stockings Dx: leg edema 1 each 0   famotidine (PEPCID) 20 MG tablet Take 20 mg by mouth 2 (two) times daily.     fexofenadine (ALLEGRA) 180 MG tablet Take 180 mg by mouth daily.     furosemide (LASIX) 40 MG tablet TAKE 1 TABLET BY MOUTH ONCE DAILY. 30 tablet 6   gabapentin (NEURONTIN) 300 MG capsule Take 300 mg by mouth 3 (three)  times daily.      guaiFENesin (MUCINEX) 600 MG 12 hr tablet Take 600 mg by mouth 2 (two) times daily as needed for to loosen phlegm.     hydrocortisone (ANUSOL-HC) 2.5 % rectal cream Place 1 application rectally 2 (two) times daily. Twice daily x 10 days then up to twice daily as needed for itching/irritation from hemorrhoids 30 g 1   ipratropium-albuterol (DUONEB) 0.5-2.5 (3) MG/3ML SOLN Take 3 mLs by nebulization every 6 (six) hours as needed.      levalbuterol (XOPENEX HFA) 45 MCG/ACT inhaler Inhale 2 puffs into the lungs every 6 (six) hours as needed for wheezing.     levothyroxine (SYNTHROID, LEVOTHROID) 50 MCG tablet Take 50 mcg by mouth daily before breakfast.      loperamide (IMODIUM A-D) 2 MG tablet Take 2 mg by mouth 4 (four) times daily as needed for diarrhea or loose stools.     metoprolol tartrate (LOPRESSOR) 25 MG tablet Take 25 mg by mouth daily.     montelukast (SINGULAIR) 10 MG tablet Take 1 tablet by mouth daily.     mupirocin ointment (BACTROBAN) 2 % Apply 1 application  topically as needed.     naloxone (NARCAN) 4 MG/0.1ML LIQD nasal spray kit Narcan 4 mg/actuation nasal spray     nystatin (MYCOSTATIN) powder Apply topically as needed.     ondansetron (ZOFRAN) 4 MG tablet as needed.      oxyCODONE (OXY IR/ROXICODONE) 5 MG immediate release tablet as needed.      pantoprazole (PROTONIX) 40 MG tablet take 1 tablet daily before breakfast. 90 tablet 0   PARoxetine (PAXIL) 30 MG tablet Take 30 mg by mouth daily.     potassium chloride (K-DUR) 10 MEQ tablet Take 3 tablets (30 mEq total) by mouth daily. 90 tablet 6   Sennosides (SENOKOT PO) Take by mouth in the morning and at bedtime.      Simethicone 125 MG CAPS Take 2 capsules by mouth as needed.     SYMBICORT 160-4.5 MCG/ACT inhaler Inhale 2 puffs into the lungs 2 (two) times daily.     warfarin (COUMADIN) 2 MG tablet Take 2 mg by mouth as directed. Managed by PCP     Zoledronic Acid (RECLAST IV) Inject into the vein. Dose:  08/08/2022 takes once a year     No current facility-administered medications for this visit.   Allergies:  Nitroglycerin, Other, Adhesive [tape], Codeine, and Latex   Social History: The patient  reports that she has never smoked. She has been exposed to tobacco smoke. She has never used smokeless tobacco. She reports that she does not drink alcohol and does not use drugs.  Family History: The patient's family history includes Asthma in her sister; Heart disease in her father and mother; Hypertension in her father and mother; Kidney failure in her sister; Stroke in an other family member.   ROS:  Please see the history of present illness. Otherwise, complete review of systems is positive for none.  All other systems are reviewed and negative.   Physical Exam: VS:  There were no vitals taken for this visit., BMI There is no height or weight on file to calculate BMI.  Wt Readings from Last 3 Encounters:  06/10/23 174 lb (78.9 kg)  02/28/23 178 lb 6.4 oz (80.9 kg)  01/10/23 175 lb 12.8 oz (79.7 kg)    General: Patient appears comfortable at rest. HEENT: Conjunctiva and lids normal, oropharynx clear with moist mucosa. Neck: Supple, no elevated JVP or carotid bruits, no thyromegaly. Lungs: Clear to auscultation, nonlabored breathing at rest. Cardiac: Regular rate and rhythm, no S3 or significant systolic murmur, no pericardial rub. Abdomen: Soft, nontender, no hepatomegaly, bowel sounds present, no guarding or rebound. Extremities: No pitting edema, distal pulses 2+. Skin: Warm and dry. Musculoskeletal: No kyphosis. Neuropsychiatric: Alert and oriented x3, affect grossly appropriate.  ECG:  NSR  Recent Labwork: 01/10/2023: ALT 16; AST 22; BUN 7; Creat 0.79; Hemoglobin 11.8; Platelets 304; Potassium 3.7; Sodium 142  No results found for: "CHOL", "TRIG", "HDL", "CHOLHDL", "VLDL", "LDLCALC", "LDLDIRECT"   Assessment and Plan: Patient is a 72 year old F known to have paroxysmal SVT  status post SVT ablation 1998, chronic diastolic heart failure, history of PE on chronic anticoagulation, HTN, chronic venous insufficiency is here for follow-up visit.  Preop cardiac stratification for hip and knee replacement: METs less than 4 due to hip and knee pain, will obtain NM stress test, Lexiscan.  Otherwise, no symptoms.  Preop recommendations pending stress test.  Paroxysmal SVT s/p ablation in 1998: 1 week event monitor in 12/2022 showed 6 runs of SVT, fastest lasting 2.5 seconds and longest lasting 37 seconds, no patient triggered events.  Currently on metoprolol tartrate 25 mg which we will continue.  Controlled rates.  Chronic diastolic heart failure: Echo from 01/2023 showed normal LVEF, G1 DD with elevated LVEDP, no valvular heart disease.  Continue p.o. Lasix 40 mg once daily, start Jardiance 10 mg once daily.  Obtain CMP in 5 days.  Chronic venous insufficiency: Venous reflux study confirmed the same, use compression socks.  No swelling today.  Chronic PE history: On Coumadin, goal INR between 2 and 3.  OSA awaiting CPAP: Diagnosed with OSA, waiting on CPAP.   I spent a total duration of 30 minutes reviewing notes, labs, imaging studies, face-to-face discussion of her medical condition, evaluation, management, ordering test, labs and documenting the findings in the note.   Disposition:  Follow up  6 months  Signed, Markevius Trombetta Verne Spurr, MD, 06/20/2023 10:30 AM    [facility] at [facility] [address], [place], [place] [address]

## 2023-06-20 NOTE — Patient Instructions (Signed)
Medication Instructions:  Your physician has recommended you make the following change in your medication:   -Start Jardiance 10 mg tablets once daily.   *If you need a refill on your cardiac medications before your next appointment, please call your pharmacy*   Lab Work: In 5 Dasys:  -CMP  If you have labs (blood work) drawn today and your tests are completely normal, you will receive your results only by: MyChart Message (if you have MyChart) OR A paper copy in the mail If you have any lab test that is abnormal or we need to change your treatment, we will call you to review the results.   Testing/Procedures: Your physician has requested that you have a lexiscan myoview. For further information please visit [email or web address] Please follow instruction sheet, as given.    Follow-Up: At [facility], you and your health needs are our priority.  As part of our continuing mission to provide you with exceptional heart care, we have created designated Provider Care Teams.  These Care Teams include your primary Cardiologist (physician) and Advanced Practice Providers (APPs -  Physician Assistants and Nurse Practitioners) who all work together to provide you with the care you need, when you need it.  We recommend signing up for the patient portal called "MyChart".  Sign up information is provided on this After Visit Summary.  MyChart is used to connect with patients for Virtual Visits (Telemedicine).  Patients are able to view lab/test results, encounter notes, upcoming appointments, etc.  Non-urgent messages can be sent to your provider as well.   To learn more about what you can do with MyChart, go to [email or web address].    Your next appointment:   6 month(s)  Provider:   Luane School, MD    Other Instructions

## 2023-06-25 ENCOUNTER — Other Ambulatory Visit (HOSPITAL_COMMUNITY)
Admission: RE | Admit: 2023-06-25 | Discharge: 2023-06-25 | Disposition: A | Discharge status: Home / Self Care | Payer: 59 | Source: Ambulatory Visit | Attending: Internal Medicine | Admitting: Internal Medicine

## 2023-06-25 DIAGNOSIS — Z79899 Other long term (current) drug therapy: Secondary | ICD-10-CM | POA: Diagnosis present

## 2023-06-25 LAB — COMPREHENSIVE METABOLIC PANEL
ALT: 21 U/L (ref 0–44)
AST: 27 U/L (ref 15–41)
Albumin: 3.9 g/dL (ref 3.5–5.0)
Alkaline Phosphatase: 119 U/L (ref 38–126)
Anion gap: 7 (ref 5–15)
BUN: 12 mg/dL (ref 8–23)
CO2: 28 mmol/L (ref 22–32)
Calcium: 8.4 mg/dL — ABNORMAL LOW (ref 8.9–10.3)
Chloride: 99 mmol/L (ref 98–111)
Creatinine, Ser: 0.76 mg/dL (ref 0.44–1.00)
GFR, Estimated: >60 mL/min (ref >60)
Glucose, Bld: 102 mg/dL — ABNORMAL HIGH (ref 70–99)
Potassium: 3.7 mmol/L (ref 3.5–5.1)
Sodium: 134 mmol/L — ABNORMAL LOW (ref 135–145)
Total Bilirubin: 1.2 mg/dL (ref 0.3–1.2)
Total Protein: 7.5 g/dL (ref 6.5–8.1)

## 2023-06-26 ENCOUNTER — Other Ambulatory Visit (INDEPENDENT_AMBULATORY_CARE_PROVIDER_SITE_OTHER): Payer: Self-pay | Admitting: Gastroenterology

## 2023-07-02 ENCOUNTER — Encounter (HOSPITAL_COMMUNITY): Payer: 59

## 2023-07-02 ENCOUNTER — Ambulatory Visit (HOSPITAL_COMMUNITY)
Admission: RE | Admit: 2023-07-02 | Discharge: 2023-07-02 | Disposition: A | Discharge status: Home / Self Care | Payer: 59 | Source: Ambulatory Visit | Attending: Internal Medicine | Admitting: Internal Medicine

## 2023-07-02 DIAGNOSIS — Z01810 Encounter for preprocedural cardiovascular examination: Secondary | ICD-10-CM | POA: Insufficient documentation

## 2023-07-02 LAB — NM MYOCAR MULTI W/SPECT W/WALL MOTION / EF
Base ST Depression (mm): 0 mm
LV dias vol: 53 mL (ref 46–106)
LV sys vol: 15 mL
Nuc Stress EF: 71 %
Peak HR: 93 {beats}/min
RATE: 0.3
Rest HR: 73 {beats}/min
Rest Nuclear Isotope Dose: 9.4 mCi
SDS: 0
SRS: 0
SSS: 0
ST Depression (mm): 0 mm
Stress Nuclear Isotope Dose: 27.8 mCi
TID: 1.56

## 2023-07-02 MED ORDER — SODIUM CHLORIDE FLUSH 0.9 % IV SOLN
INTRAVENOUS | Status: AC
  Administered 2023-07-02: 10 mL via INTRAVENOUS
  Filled 2023-07-02: qty 10

## 2023-07-02 MED ORDER — TECHNETIUM TC 99M TETROFOSMIN IV KIT
9.4000 | PACK | Freq: Once | INTRAVENOUS | Status: AC | PRN
  Administered 2023-07-02: 9.4 via INTRAVENOUS

## 2023-07-02 MED ORDER — REGADENOSON 0.4 MG/5ML IV SOLN
INTRAVENOUS | Status: AC
  Administered 2023-07-02: 0.4 mg via INTRAVENOUS
  Filled 2023-07-02: qty 5

## 2023-07-02 MED ORDER — TECHNETIUM TC 99M TETROFOSMIN IV KIT
27.8000 | PACK | Freq: Once | INTRAVENOUS | Status: AC | PRN
  Administered 2023-07-02: 27.8 via INTRAVENOUS

## 2023-07-09 ENCOUNTER — Telehealth: Payer: Self-pay | Admitting: Internal Medicine

## 2023-07-09 NOTE — Telephone Encounter (Signed)
Stress ECG is negative for ischemia and arrhythmias.   LV perfusion is normal. There is no evidence of ischemia. There is no evidence of infarction.   Left ventricular function is normal. Nuclear stress EF: 71%.   Findings are consistent with no ischemia and no infarction. The study is low risk.    Vishnu P Mallipeddi, MD 07/02/2023  1:24 PM EDT     Normal stress test. Low risk for hip or knee replacement. Please send this to the patient's surgeons's office.      I attempted to reach pt,contact number says unable to reach, no answer on home phone.  We had to mail a letter to patient as we could not reach her.

## 2023-07-09 NOTE — Telephone Encounter (Signed)
Patient called back and normal results discussed with her. pcp copied

## 2023-07-09 NOTE — Telephone Encounter (Signed)
Patient returned call for test results.  °

## 2023-07-09 NOTE — Telephone Encounter (Signed)
Phone now goes to answering machine [phone]) , left message to return call

## 2023-07-10 ENCOUNTER — Ambulatory Visit: Payer: 59 | Attending: Rheumatology | Admitting: Rheumatology

## 2023-07-10 ENCOUNTER — Encounter: Payer: Self-pay | Admitting: Rheumatology

## 2023-07-10 VITALS — BP 117/76 | HR 53 | Resp 17 | Ht 59.0 in | Wt 176.0 lb

## 2023-07-10 DIAGNOSIS — M25512 Pain in left shoulder: Secondary | ICD-10-CM | POA: Diagnosis not present

## 2023-07-10 DIAGNOSIS — M797 Fibromyalgia: Secondary | ICD-10-CM

## 2023-07-10 DIAGNOSIS — M5030 Other cervical disc degeneration, unspecified cervical region: Secondary | ICD-10-CM

## 2023-07-10 DIAGNOSIS — Z8679 Personal history of other diseases of the circulatory system: Secondary | ICD-10-CM

## 2023-07-10 DIAGNOSIS — M8589 Other specified disorders of bone density and structure, multiple sites: Secondary | ICD-10-CM

## 2023-07-10 DIAGNOSIS — Z7901 Long term (current) use of anticoagulants: Secondary | ICD-10-CM

## 2023-07-10 DIAGNOSIS — M1120 Other chondrocalcinosis, unspecified site: Secondary | ICD-10-CM

## 2023-07-10 DIAGNOSIS — Z5181 Encounter for therapeutic drug level monitoring: Secondary | ICD-10-CM

## 2023-07-10 DIAGNOSIS — M25562 Pain in left knee: Secondary | ICD-10-CM

## 2023-07-10 DIAGNOSIS — M19042 Primary osteoarthritis, left hand: Secondary | ICD-10-CM

## 2023-07-10 DIAGNOSIS — M1611 Unilateral primary osteoarthritis, right hip: Secondary | ICD-10-CM

## 2023-07-10 DIAGNOSIS — M19041 Primary osteoarthritis, right hand: Secondary | ICD-10-CM | POA: Diagnosis not present

## 2023-07-10 DIAGNOSIS — Z86711 Personal history of pulmonary embolism: Secondary | ICD-10-CM

## 2023-07-10 DIAGNOSIS — Z8669 Personal history of other diseases of the nervous system and sense organs: Secondary | ICD-10-CM

## 2023-07-10 DIAGNOSIS — M47816 Spondylosis without myelopathy or radiculopathy, lumbar region: Secondary | ICD-10-CM

## 2023-07-10 DIAGNOSIS — Z8709 Personal history of other diseases of the respiratory system: Secondary | ICD-10-CM

## 2023-07-10 DIAGNOSIS — G8929 Other chronic pain: Secondary | ICD-10-CM

## 2023-07-10 DIAGNOSIS — M170 Bilateral primary osteoarthritis of knee: Secondary | ICD-10-CM

## 2023-07-18 ENCOUNTER — Telehealth: Payer: Self-pay

## 2023-07-18 NOTE — Telephone Encounter (Signed)
I returned patients call regarding clearances and surgery.  Left voice mail for her to call me back.

## 2023-07-26 ENCOUNTER — Telehealth: Payer: Self-pay

## 2023-07-26 NOTE — Telephone Encounter (Signed)
Patient called after last visit wanting to scheduling right THA.  I got clearances and surgery is planned for 09/10/23.  Darl Pikes, sister, called with concerns about moving forward with surgery.  She states patient is very sedentary with very minimal mobility.  She either lays down or sits down most of the time.  She is afraid patient will not do well after surgery because she will not get up and move around like she needs to.  She is wondering if patient could get some therapy to encourage mobility and postpone surgery until she has had this.  She is willing to come to the office to discuss this along with patient.  However, she doesn't think patient would like that.  She is afraid patient has not been truthful about her lack of mobility and would not want patient to have surgery with detrimental outcome.  What do you think?

## 2023-07-30 ENCOUNTER — Encounter (INDEPENDENT_AMBULATORY_CARE_PROVIDER_SITE_OTHER): Payer: Self-pay | Admitting: Gastroenterology

## 2023-08-21 ENCOUNTER — Other Ambulatory Visit: Payer: Self-pay

## 2023-08-21 NOTE — Telephone Encounter (Signed)
Spoke with patient and sister again, on conference call through our phone system, patient agreed to visit in office with family for further discussion with Dr. Magnus Ivan before proceeding with surgery.

## 2023-08-30 ENCOUNTER — Telehealth: Payer: Self-pay | Admitting: Internal Medicine

## 2023-08-30 NOTE — Telephone Encounter (Signed)
Patient is calling to speak with Dr. Jenene Slicker or nurse in regards to compression socks

## 2023-09-02 ENCOUNTER — Other Ambulatory Visit (HOSPITAL_COMMUNITY): Payer: 59

## 2023-09-02 ENCOUNTER — Ambulatory Visit (INDEPENDENT_AMBULATORY_CARE_PROVIDER_SITE_OTHER): Payer: 59 | Admitting: Gastroenterology

## 2023-09-02 NOTE — Telephone Encounter (Signed)
Left message to return call 

## 2023-09-02 NOTE — Telephone Encounter (Signed)
Patient went to [facility] pharmacy and had her legs measured for compression stockings, she need prescription. I had DOD, Dr. Eden Emms sign rx and fax to [facility]

## 2023-09-02 NOTE — Telephone Encounter (Signed)
Patient is returning call. Transferred to Catherine, RN.  

## 2023-09-10 ENCOUNTER — Ambulatory Visit (HOSPITAL_COMMUNITY): Admit: 2023-09-10 | Payer: 59 | Admitting: Orthopaedic Surgery

## 2023-09-10 SURGERY — ARTHROPLASTY, HIP, TOTAL, ANTERIOR APPROACH
Anesthesia: Spinal | Site: Hip | Laterality: Right

## 2023-09-11 ENCOUNTER — Encounter: Payer: Self-pay | Admitting: Orthopaedic Surgery

## 2023-09-11 ENCOUNTER — Ambulatory Visit: Payer: 59 | Admitting: Orthopaedic Surgery

## 2023-09-11 ENCOUNTER — Other Ambulatory Visit: Payer: Self-pay | Admitting: Radiology

## 2023-09-11 DIAGNOSIS — M25551 Pain in right hip: Secondary | ICD-10-CM | POA: Diagnosis not present

## 2023-09-11 DIAGNOSIS — M1611 Unilateral primary osteoarthritis, right hip: Secondary | ICD-10-CM

## 2023-09-11 NOTE — Progress Notes (Signed)
 The patient is a 73 year old female well-known to me.  She does have arthritis in both of her hips with the right much worse than the left.  We were going to pursue right hip replacement surgery on her that surgery has been canceled.  Her sister is with her today is quite concerned about the patient's level of function and mobility.  We did obtain cardiac clearance for the surgery and the patient is on Coumadin.  Her sister let me know that the patient only is up about an hour or more day only and spends most of her day either in bed or sitting and is not mobile at all.  She does ambulate with a walker.  The patient states that she is not mobile mainly due to her pain but she has severe deconditioned.  They tried home therapy several years ago but she said she had too many breathing issues then.  Again she has had a perfusion study and has been cleared by cardiology for surgery but I understand the sisters concern about the patient's lack of mobility and is proceeding with surgery even warranted given the fact that the patient does not mobilize at all.  On exam both hips move smoothly but do have pain in the groin with range of motion more so on the right than the left.  Again her previous x-rays show significant arthritis that is end-stage of the right hip and moderate of the left hip.  I do feel it is appropriate to try outpatient physical therapy for the patient in terms of just her overall conditioning and balance and mobility.  We can then see her back in 2 months and hopefully her sister would, as well so we can see what her mobility is like.  She understands that we are not pushing her into surgery at all and this is a recommendation based on what we saw in the past and her level of pain.  We can see how she is doing from a mobility standpoint in 2 months.  Her sister agrees with this treatment plan.

## 2023-09-23 ENCOUNTER — Ambulatory Visit (INDEPENDENT_AMBULATORY_CARE_PROVIDER_SITE_OTHER): Payer: 59 | Admitting: Gastroenterology

## 2023-09-23 ENCOUNTER — Encounter: Payer: 59 | Admitting: Orthopaedic Surgery

## 2023-09-24 ENCOUNTER — Other Ambulatory Visit (INDEPENDENT_AMBULATORY_CARE_PROVIDER_SITE_OTHER): Payer: Self-pay | Admitting: Gastroenterology

## 2023-09-25 ENCOUNTER — Other Ambulatory Visit: Payer: Self-pay

## 2023-09-25 ENCOUNTER — Ambulatory Visit (HOSPITAL_COMMUNITY): Payer: 59 | Attending: Orthopaedic Surgery

## 2023-09-25 DIAGNOSIS — M545 Low back pain, unspecified: Secondary | ICD-10-CM | POA: Diagnosis present

## 2023-09-25 DIAGNOSIS — R262 Difficulty in walking, not elsewhere classified: Secondary | ICD-10-CM | POA: Insufficient documentation

## 2023-09-25 DIAGNOSIS — M6281 Muscle weakness (generalized): Secondary | ICD-10-CM | POA: Insufficient documentation

## 2023-09-25 DIAGNOSIS — M25551 Pain in right hip: Secondary | ICD-10-CM | POA: Diagnosis present

## 2023-09-25 DIAGNOSIS — G8929 Other chronic pain: Secondary | ICD-10-CM | POA: Insufficient documentation

## 2023-09-25 DIAGNOSIS — M1611 Unilateral primary osteoarthritis, right hip: Secondary | ICD-10-CM | POA: Insufficient documentation

## 2023-09-25 NOTE — Therapy (Signed)
OUTPATIENT PHYSICAL THERAPY LOWER EXTREMITY EVALUATION   Patient Name: NEFELI BIRT MRN: [id number] DOB:04-07-1951, 73 y.o., female Today's Date: 09/25/2023  END OF SESSION:  PT End of Session - 09/25/23 0912     Visit Number 1    Number of Visits 5    Date for PT Re-Evaluation 10/23/23    Authorization Type U[employer] Dual Complete (no auth)    Progress Note Due on Visit 5    PT Start Time 0805    PT Stop Time 0845    PT Time Calculation (min) 40 min    Activity Tolerance Patient tolerated treatment well    Behavior During Therapy [facility] for tasks assessed/performed            Past Medical History:  Diagnosis Date   Acid reflux    Asthma    Cardiac dysrhythmia, unspecified    Carotid bruit    Bilateral   Cataract    Chest pain, unspecified    Chronic airway obstruction, not elsewhere classified    Chronic kidney disease    mild per patient   Chronic sinusitis    Congestive heart failure, unspecified    DDD (degenerative disc disease), cervical 07/24/2016   DDD (degenerative disc disease), lumbar 07/24/2016   Depressive disorder, not elsewhere classified    Diaphragmatic hernia without mention of obstruction or gangrene    Edema    left leg   Fibromyalgia    Hemorrhoid    High cholesterol    History of rheumatoid arthritis    Hypothyroidism    Neuropathy    Obesity, unspecified    Obstructive lung disease (generalized) (HCC)    Osteoarthritis of both hands 07/24/2016   Osteopenia    Osteoporosis 07/24/2016   Palpitations    Pseudogout involving multiple joints    Sleep apnea    Sleep apnea    per patient   SVT (supraventricular tachycardia) (HCC)    Syncope    admitted 09/2011   Thyroid disease    Unspecified essential hypertension    Past Surgical History:  Procedure Laterality Date   CARPAL TUNNEL RELEASE Bilateral    CATARACT EXTRACTION, BILATERAL     11/02/2021, 11/23/2021   CATHETER ABLATION  02/01/1997   @ [facility]   COLONOSCOPY WITH PROPOFOL  N/A 08/18/2021   multiple adenomas. non-bleeding internal hemorrhoids.   INCONTINENCE SURGERY     POLYPECTOMY  08/18/2021   Procedure: POLYPECTOMY;  Surgeon: Marguerita Merles, Reuel Boom, MD;  Location: [facility];  Service: Gastroenterology;;   removal toenail Bilateral    great toes   VEIN SURGERY BLADDER     Patient Active Problem List   Diagnosis Date Noted   Preop cardiovascular exam 06/20/2023   PSVT (paroxysmal supraventricular tachycardia) (HCC) 06/20/2023   Unilateral primary osteoarthritis, right hip 06/10/2023   Gastroesophageal reflux disease 02/28/2023   Rectal irritation 02/28/2023   Chronic venous insufficiency 12/14/2022   Rectal bleeding 03/19/2022   Fecal urgency 03/19/2022   Bloating 03/19/2022   Grade I hemorrhoids 12/19/2021   Ankle joint pain 12/07/2019   Low back pain 12/07/2019   Lumbar radiculopathy 12/07/2019   Pain of left upper arm 12/07/2019   Acute foot pain, left 04/15/2019   Bursitis/tendonitis, shoulder 04/23/2018   Inflammatory arthropathy 02/20/2018   Shoulder pain 02/20/2018   Pain in both knees 11/20/2017   Chondromalacia patellae, left knee 01/15/2017   Chondromalacia patellae, right knee 01/15/2017   Osteoarthritis of both hands 07/24/2016   DDD (degenerative disc disease), cervical 07/24/2016  DDD (degenerative disc disease), lumbar 07/24/2016   Osteoporosis 07/24/2016   Glaucoma 07/24/2016   Sleep apnea 07/24/2016   Pseudogout 07/19/2016   Primary osteoarthritis of both knees 07/19/2016   Fibromyalgia 07/19/2016   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   Pulmonary embolism (HCC) 01/18/2012   Anxiety 01/18/2012   Syncope    RHEUMATOID LUNG 12/30/2009   OBESITY, UNSPECIFIED 06/02/2009   DEPRESSIVE DISORDER NOT ELSEWHERE CLASSIFIED 06/02/2009   SUPRAVENTRICULAR TACHYCARDIA 06/02/2009   Chronic diastolic heart failure (HCC) 06/02/2009   COPD 06/02/2009   DIAPHRAGMAT HERN W/O MENTION OBSTRUCTION/GANGREN 06/02/2009   Palpitations  06/02/2009   CHEST PAIN UNSPECIFIED 06/02/2009    PCP: Toma Deiters, MD  REFERRING PROVIDER: Kathryne Hitch, MD  REFERRING DIAG: M16.11 (ICD-10-CM) - Unilateral primary osteoarthritis, right hip  THERAPY DIAG:  Pain in right hip  Muscle weakness (generalized)  Difficulty in walking, not elsewhere classified  Rationale for Evaluation and Treatment: Rehabilitation  ONSET DATE: last year  SUBJECTIVE:   SUBJECTIVE STATEMENT: EVAL: Arrives to the clinic with R hip pain and L knee pain (see below) on a standard walker but uses SPC at home. Patient reports some numbness and weakness on the legs. Patient reports of multiple episodes of falls. Condition started last year on the hip without apparent reason. Pain on the knees has been going on for years but got worse since she fell almost 2 weeks ago. Patient has been taking oxycodone for pain which doesn't help that much. Patient has fallen almost 2 weeks ago. X-ray of the hip was unremarkable. Recently, patient was referred to outpatient PT evaluation and management.   PERTINENT HISTORY: Gout, pseudogout, low back pain PAIN:  Are you having pain? Yes: NPRS scale: 8/10 on R hip, 3/10 L knee Pain location: R hip pain (groin shoots to the front of thigh and side) and L knee pain (front/back) Pain description: sharp Aggravating factors: standing 15-20 minutes, night Relieving factors: pain meds (oxycodone)  PRECAUTIONS: Fall  RED FLAGS: None   WEIGHT BEARING RESTRICTIONS: No  FALLS:  Has patient fallen in last 6 months? Yes. Number of falls 6 (most recent was 2 weeks ago)  LIVING ENVIRONMENT: Lives with:  sister Lives in: House/apartment Stairs: Yes: Internal: 11 steps; on left going up and External: 0 steps; n/a Has following equipment at home: Single point cane, Walker - 2 wheeled, and shower chair  OCCUPATION: retired  PLOF: Independent and Independent with basic ADLs  PATIENT GOALS: "for my hip to get  better"  NEXT MD VISIT: March 2025  OBJECTIVE:  Note: Objective measures were completed at Evaluation unless otherwise noted.  DIAGNOSTIC FINDINGS:  06/10/23 An AP pelvis and lateral the right hip shows severe end-stage arthritis of  the right hip with loss of joint space, near bone-on-bone wear, cystic  changes and sclerotic changes.  There is moderate arthritis of the left  hip.  PATIENT SURVEYS:  ABC scale 250/1600 = 15.2%  COGNITION: Overall cognitive status:  slightly slow to answer to questions      SENSATION: Not tested  MUSCLE LENGTH: Hamstrings: moderate restriction on B Gastrocnemius: moderate restriction on B  POSTURE: rounded shoulders, forward head, and decreased lumbar lordosis   LOWER EXTREMITY ROM:  Active ROM Right eval Left eval  Hip flexion [facility]  Hip extension    Hip abduction    Hip adduction    Hip internal rotation    Hip external rotation    Knee flexion [facility]  Knee extension [facility]  Ankle  dorsiflexion [facility] WFL  Ankle plantarflexion [facility] WFL  Ankle inversion    Ankle eversion     (Blank rows = not tested)  LOWER EXTREMITY MMT:  MMT Right eval Left eval  Hip flexion 3+ 4-  Hip extension    Hip abduction 4- 4-  Hip adduction    Hip internal rotation    Hip external rotation    Knee flexion 3+ 4-  Knee extension 4- 4-  Ankle dorsiflexion 4 4-  Ankle plantarflexion 4 4-  Ankle inversion    Ankle eversion     (Blank rows = not tested)  FUNCTIONAL TESTS:  Timed up and go (TUG): to be determined 2 minute walk test: 47 ft 30 sec chair stand test: 2 reps with use of UE  GAIT: Distance walked: 47 ft Assistive device utilized:  standard walker Level of assistance: Modified independence Comments: done during , stooped posture, slow cadence, decreased step length on B, decreased swing phase on B                                                                                                                                 TREATMENT DATE:  09/25/23 Evaluation and patient education done    PATIENT EDUCATION:  Education details: Educated on the pathoanatomy of hip pain. Educated on the goals and course of rehab. Educated on measures to reduce falls at home. Person educated: Patient Education method: Explanation Education comprehension: needs further education  HOME EXERCISE PROGRAM: None provided to date  ASSESSMENT:  CLINICAL IMPRESSION: EVAL: Patient is a 73 y.o. female who was seen today for physical therapy evaluation and treatment for R hip pain. Patient's condition is further defined by difficulty with walking and standing due to pain, weakness, and decreased soft tissue extensibility. Skilled PT is required to address the impairments and functional limitations listed below. Patient fell almost 2 weeks ago and has been complaining of knee pain. With this, patient's knee may need to be further examined by referring provider or PCP for evaluation and management.  OBJECTIVE IMPAIRMENTS: Abnormal gait, decreased activity tolerance, decreased balance, decreased mobility, difficulty walking, decreased strength, and impaired flexibility.   ACTIVITY LIMITATIONS: carrying, lifting, bending, standing, squatting, stairs, and transfers  PARTICIPATION LIMITATIONS: meal prep, cleaning, laundry, driving, shopping, community activity, and yard work  PERSONAL FACTORS: Age, Time since onset of injury/illness/exacerbation, and 3+ comorbidities: Gout, pseudogout, low back pain  are also affecting patient's functional outcome.   REHAB POTENTIAL: Fair    CLINICAL DECISION MAKING: Unstable/unpredictable  EVALUATION COMPLEXITY: High   GOALS: Goals reviewed with patient? Yes  SHORT TERM GOALS: Target date: 10/09/23 Pt will demonstrate indep in HEP to facilitate carry-over of skilled services and improve functional outcomes  LONG TERM GOALS: Target date: 10/23/23  Pt will improve ABC scale score by 30% in order to  demonstrate improved confidence with balance and safety during ambulation and other ADLs  Baseline: 15.2% Goal status: INITIAL  2.  Pt will be able to stand up 5x in the 30 sec chair-to-stand test to demonstrate clinically significant improvement in balance and LE strength.   Baseline: 2x Goal status: INITIAL  3.  Pt will increase by at least 40 ft in order to demonstrate clinically significant improvement in community ambulation  Baseline: 47 ft Goal status: INITIAL  4.  Pt will demonstrate increase in LE strength to 4-/5 to facilitate ease and safety in ambulation  Baseline: 3+/5 Goal status: INITIAL  5.  Pt will have a decrease in TUG score by at least 3 sec in order to demonstrate clinically significant improvement in community ambulation Baseline: to be determined Goal status: INITIAL  6. Pt will be able to tolerate prolonged periods of standing (>25 minutes) mild pain (4-5/10) to facilitate ease in ADLs Baseline: 8/10 Goal status: INITIAL  PLAN:  PT FREQUENCY: 1x/week  PT DURATION: 4 weeks  PLANNED INTERVENTIONS: 97164- PT Re-evaluation, 97110-Therapeutic exercises, 97530- Therapeutic activity, [id number]- Neuromuscular re-education, 97535- Self Care, 88416- Manual therapy, 60630- Gait training, and Patient/Family education  PLAN FOR NEXT SESSION: Provide HEP. Assess TUG score. Begin LE flexibility, strengthening, and balance activities.   Tish Frederickson. Toby Breithaupt, PT, DPT, OCS Board-Certified Clinical Specialist in Orthopedic PT PT Compact Privilege # ([place]): [id number] T 09/25/2023, 9:14 AM

## 2023-10-02 ENCOUNTER — Ambulatory Visit (HOSPITAL_COMMUNITY): Payer: 59 | Admitting: Physical Therapy

## 2023-10-02 DIAGNOSIS — M25551 Pain in right hip: Secondary | ICD-10-CM

## 2023-10-02 DIAGNOSIS — G8929 Other chronic pain: Secondary | ICD-10-CM

## 2023-10-02 DIAGNOSIS — R262 Difficulty in walking, not elsewhere classified: Secondary | ICD-10-CM

## 2023-10-02 DIAGNOSIS — M6281 Muscle weakness (generalized): Secondary | ICD-10-CM

## 2023-10-02 NOTE — Therapy (Signed)
OUTPATIENT PHYSICAL THERAPY TREATMENT   Patient Name: Dorothy Ford MRN: [id number] DOB:06-12-1951, 73 y.o., female Today's Date: 10/02/2023  END OF SESSION:  PT End of Session - 10/02/23 1053     Visit Number 2    Number of Visits 5    Date for PT Re-Evaluation 10/23/23    Authorization Type U[employer] Dual Complete (no auth)    Progress Note Due on Visit 5    PT Start Time 1022    PT Stop Time 1100    PT Time Calculation (min) 38 min    Activity Tolerance Patient tolerated treatment well    Behavior During Therapy WFL for tasks assessed/performed            Past Medical History:  Diagnosis Date   Acid reflux    Asthma    Cardiac dysrhythmia, unspecified    Carotid bruit    Bilateral   Cataract    Chest pain, unspecified    Chronic airway obstruction, not elsewhere classified    Chronic kidney disease    mild per patient   Chronic sinusitis    Congestive heart failure, unspecified    DDD (degenerative disc disease), cervical 07/24/2016   DDD (degenerative disc disease), lumbar 07/24/2016   Depressive disorder, not elsewhere classified    Diaphragmatic hernia without mention of obstruction or gangrene    Edema    left leg   Fibromyalgia    Hemorrhoid    High cholesterol    History of rheumatoid arthritis    Hypothyroidism    Neuropathy    Obesity, unspecified    Obstructive lung disease (generalized) (HCC)    Osteoarthritis of both hands 07/24/2016   Osteopenia    Osteoporosis 07/24/2016   Palpitations    Pseudogout involving multiple joints    Sleep apnea    Sleep apnea    per patient   SVT (supraventricular tachycardia) (HCC)    Syncope    admitted 09/2011   Thyroid disease    Unspecified essential hypertension    Past Surgical History:  Procedure Laterality Date   CARPAL TUNNEL RELEASE Bilateral    CATARACT EXTRACTION, BILATERAL     11/02/2021, 11/23/2021   CATHETER ABLATION  02/01/1997   @ [facility]   COLONOSCOPY WITH PROPOFOL N/A 08/18/2021    multiple adenomas. non-bleeding internal hemorrhoids.   INCONTINENCE SURGERY     POLYPECTOMY  08/18/2021   Procedure: POLYPECTOMY;  Surgeon: Marguerita Merles, Reuel Boom, MD;  Location: [facility];  Service: Gastroenterology;;   removal toenail Bilateral    great toes   VEIN SURGERY BLADDER     Patient Active Problem List   Diagnosis Date Noted   Preop cardiovascular exam 06/20/2023   PSVT (paroxysmal supraventricular tachycardia) (HCC) 06/20/2023   Unilateral primary osteoarthritis, right hip 06/10/2023   Gastroesophageal reflux disease 02/28/2023   Rectal irritation 02/28/2023   Chronic venous insufficiency 12/14/2022   Rectal bleeding 03/19/2022   Fecal urgency 03/19/2022   Bloating 03/19/2022   Grade I hemorrhoids 12/19/2021   Ankle joint pain 12/07/2019   Low back pain 12/07/2019   Lumbar radiculopathy 12/07/2019   Pain of left upper arm 12/07/2019   Acute foot pain, left 04/15/2019   Bursitis/tendonitis, shoulder 04/23/2018   Inflammatory arthropathy 02/20/2018   Shoulder pain 02/20/2018   Pain in both knees 11/20/2017   Chondromalacia patellae, left knee 01/15/2017   Chondromalacia patellae, right knee 01/15/2017   Osteoarthritis of both hands 07/24/2016   DDD (degenerative disc disease), cervical 07/24/2016  DDD (degenerative disc disease), lumbar 07/24/2016   Osteoporosis 07/24/2016   Glaucoma 07/24/2016   Sleep apnea 07/24/2016   Pseudogout 07/19/2016   Primary osteoarthritis of both knees 07/19/2016   Fibromyalgia 07/19/2016   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   Pulmonary embolism (HCC) 01/18/2012   Anxiety 01/18/2012   Syncope    RHEUMATOID LUNG 12/30/2009   OBESITY, UNSPECIFIED 06/02/2009   DEPRESSIVE DISORDER NOT ELSEWHERE CLASSIFIED 06/02/2009   SUPRAVENTRICULAR TACHYCARDIA 06/02/2009   Chronic diastolic heart failure (HCC) 06/02/2009   COPD 06/02/2009   DIAPHRAGMAT HERN W/O MENTION OBSTRUCTION/GANGREN 06/02/2009   Palpitations 06/02/2009   CHEST PAIN  UNSPECIFIED 06/02/2009    PCP: Toma Deiters, MD  REFERRING PROVIDER: Kathryne Hitch, MD  REFERRING DIAG: M16.11 (ICD-10-CM) - Unilateral primary osteoarthritis, right hip  THERAPY DIAG:  Pain in right hip  Muscle weakness (generalized)  Difficulty in walking, not elsewhere classified  Chronic low back pain, unspecified back pain laterality, unspecified whether sciatica present  Rationale for Evaluation and Treatment: Rehabilitation  ONSET DATE: last year  SUBJECTIVE:   SUBJECTIVE STATEMENT: Pt states no significant change from last visit.    EVAL: Arrives to the clinic with R hip pain and L knee pain (see below) on a standard walker but uses SPC at home. Patient reports some numbness and weakness on the legs. Patient reports of multiple episodes of falls. Condition started last year on the hip without apparent reason. Pain on the knees has been going on for years but got worse since she fell almost 2 weeks ago. Patient has been taking oxycodone for pain which doesn't help that much. Patient has fallen almost 2 weeks ago. X-ray of the hip was unremarkable. Recently, patient was referred to outpatient PT evaluation and management.   PERTINENT HISTORY: Gout, pseudogout, low back pain PAIN:  Are you having pain? Yes: NPRS scale: 8/10 on R hip, 3/10 L knee Pain location: R hip pain (groin shoots to the front of thigh and side) and L knee pain (front/back) Pain description: sharp Aggravating factors: standing 15-20 minutes, night Relieving factors: pain meds (oxycodone)  PRECAUTIONS: Fall  RED FLAGS: None   WEIGHT BEARING RESTRICTIONS: No  FALLS:  Has patient fallen in last 6 months? Yes. Number of falls 6 (most recent was 2 weeks ago)  LIVING ENVIRONMENT: Lives with:  sister Lives in: House/apartment Stairs: Yes: Internal: 11 steps; on left going up and External: 0 steps; n/a Has following equipment at home: Single point cane, Walker - 2 wheeled, and shower  chair  OCCUPATION: retired  PLOF: Independent and Independent with basic ADLs  PATIENT GOALS: "for my hip to get better"  NEXT MD VISIT: March 2025  OBJECTIVE:  Note: Objective measures were completed at Evaluation unless otherwise noted.  DIAGNOSTIC FINDINGS:  06/10/23 An AP pelvis and lateral the right hip shows severe end-stage arthritis of  the right hip with loss of joint space, near bone-on-bone wear, cystic  changes and sclerotic changes.  There is moderate arthritis of the left  hip.  PATIENT SURVEYS:  ABC scale 250/1600 = 15.2%  COGNITION: Overall cognitive status:  slightly slow to answer to questions      SENSATION: Not tested  MUSCLE LENGTH: Hamstrings: moderate restriction on B Gastrocnemius: moderate restriction on B  POSTURE: rounded shoulders, forward head, and decreased lumbar lordosis   LOWER EXTREMITY ROM:  Active ROM Right eval Left eval  Hip flexion [facility]  Hip extension    Hip abduction    Hip adduction  Hip internal rotation    Hip external rotation    Knee flexion [facility] WFL  Knee extension [facility]  Ankle dorsiflexion [facility]  Ankle plantarflexion [facility] WFL  Ankle inversion    Ankle eversion     (Blank rows = not tested)  LOWER EXTREMITY MMT:  MMT Right eval Left eval  Hip flexion 3+ 4-  Hip extension    Hip abduction 4- 4-  Hip adduction    Hip internal rotation    Hip external rotation    Knee flexion 3+ 4-  Knee extension 4- 4-  Ankle dorsiflexion 4 4-  Ankle plantarflexion 4 4-  Ankle inversion    Ankle eversion     (Blank rows = not tested)  FUNCTIONAL TESTS:  Timed up and go (TUG): to be determined 2 minute walk test: 47 ft 30 sec chair stand test: 2 reps with use of UE  GAIT: Distance walked: 47 ft Assistive device utilized:  standard walker Level of assistance: Modified independence Comments: done during , stooped posture, slow cadence, decreased step length on B, decreased swing phase on B                                                                                                                                 TREATMENT DATE:  10/02/23 TUG test 1:02.47" with RW Goal review/POC  Standing in // bars with bil UE assist heel raises 10X Toe raises 10X Hip abduction 10X each Sit to stands no UE assist 2X5  09/25/23 Evaluation and patient education done    PATIENT EDUCATION:  Education details: Educated on the pathoanatomy of hip pain. Educated on the goals and course of rehab. Educated on measures to reduce falls at home. Person educated: Patient Education method: Explanation Education comprehension: needs further education  HOME EXERCISE PROGRAM: Evaluation: None provided to date  Access Code: [email or web address] URL: [email or web address]  Date: 10/02/2023 Prepared by: Emeline Gins Exercises - Standing Heel Raises  - 2 x daily - 7 x weekly - 10 reps - Heel Toe Raises with Counter Support  - 2 x daily - 7 x weekly - 10 reps - Standing Hip Abduction with Anterior Support  - 2 x daily - 7 x weekly - 10 reps - Sit to Stand Without Arm Support  - 2 x daily - 7 x weekly - 10 reps   ASSESSMENT:  CLINICAL IMPRESSION: Pt returns today using standard walker.  Discussed acquiring rolled wheel attachments for front of walker as patient used clinic RW with much improvement.  Pt completed TUG test with RW as well, however did take extreme time due to weakness and cautious gait. Added standing LE strengthening exercises with minimal cues needed.  Pt required frequent rest breaks due to fatigue as well.  Strengthening exercises added today were given for HEP.  Pt will continue to benefit from skilled therapy to progress towards goals.   EVAL: Patient is a 73  y.o. female who was seen today for physical therapy evaluation and treatment for R hip pain. Patient's condition is further defined by difficulty with walking and standing due to pain, weakness, and decreased soft tissue  extensibility. Skilled PT is required to address the impairments and functional limitations listed below. Patient fell almost 2 weeks ago and has been complaining of knee pain. With this, patient's knee may need to be further examined by referring provider or PCP for evaluation and management.  OBJECTIVE IMPAIRMENTS: Abnormal gait, decreased activity tolerance, decreased balance, decreased mobility, difficulty walking, decreased strength, and impaired flexibility.   ACTIVITY LIMITATIONS: carrying, lifting, bending, standing, squatting, stairs, and transfers  PARTICIPATION LIMITATIONS: meal prep, cleaning, laundry, driving, shopping, community activity, and yard work  PERSONAL FACTORS: Age, Time since onset of injury/illness/exacerbation, and 3+ comorbidities: Gout, pseudogout, low back pain  are also affecting patient's functional outcome.   REHAB POTENTIAL: Fair    CLINICAL DECISION MAKING: Unstable/unpredictable  EVALUATION COMPLEXITY: High   GOALS: Goals reviewed with patient? Yes  SHORT TERM GOALS: Target date: 10/09/23 Pt will demonstrate indep in HEP to facilitate carry-over of skilled services and improve functional outcomes  LONG TERM GOALS: Target date: 10/23/23  Pt will improve ABC scale score by 30% in order to demonstrate improved confidence with balance and safety during ambulation and other ADLs  Baseline: 15.2% Goal status: INITIAL  2.  Pt will be able to stand up 5x in the 30 sec chair-to-stand test to demonstrate clinically significant improvement in balance and LE strength.   Baseline: 2x Goal status: INITIAL  3.  Pt will increase by at least 40 ft in order to demonstrate clinically significant improvement in community ambulation  Baseline: 47 ft Goal status: INITIAL  4.  Pt will demonstrate increase in LE strength to 4-/5 to facilitate ease and safety in ambulation  Baseline: 3+/5 Goal status: INITIAL  5.  Pt will have a decrease in TUG score by at least  3 sec in order to demonstrate clinically significant improvement in community ambulation Baseline: to be determined Goal status: INITIAL  6. Pt will be able to tolerate prolonged periods of standing (>25 minutes) mild pain (4-5/10) to facilitate ease in ADLs Baseline: 8/10 Goal status: INITIAL  PLAN:  PT FREQUENCY: 1x/week  PT DURATION: 4 weeks  PLANNED INTERVENTIONS: 97164- PT Re-evaluation, 97110-Therapeutic exercises, 97530- Therapeutic activity, 97112- Neuromuscular re-education, 97535- Self Care, 09604- Manual therapy, [phone]- Gait training, and Patient/Family education  PLAN FOR NEXT SESSION: Progress LE flexibility, strengthening, and balance activities.   Lurena Nida, PTA/CLT [facility] Ph: [phone]  10/02/2023, 10:54 AM

## 2023-10-09 ENCOUNTER — Encounter (HOSPITAL_COMMUNITY): Payer: 59

## 2023-10-16 ENCOUNTER — Ambulatory Visit (HOSPITAL_COMMUNITY): Payer: 59 | Attending: Orthopaedic Surgery

## 2023-10-16 DIAGNOSIS — R262 Difficulty in walking, not elsewhere classified: Secondary | ICD-10-CM | POA: Diagnosis not present

## 2023-10-16 DIAGNOSIS — M6281 Muscle weakness (generalized): Secondary | ICD-10-CM | POA: Insufficient documentation

## 2023-10-16 DIAGNOSIS — M25551 Pain in right hip: Secondary | ICD-10-CM | POA: Diagnosis not present

## 2023-10-16 NOTE — Therapy (Signed)
OUTPATIENT PHYSICAL THERAPY TREATMENT   Patient Name: ANIQUE BECKLEY MRN: [id number] DOB:06-08-1951, 73 y.o., female Today's Date: 10/16/2023  END OF SESSION:  PT End of Session - 10/16/23 1031     Visit Number 3    Number of Visits 5    Date for PT Re-Evaluation 10/23/23    Authorization Type [employer] Dual Complete (no auth)    Progress Note Due on Visit 5    PT Start Time 1020    PT Stop Time 1100    PT Time Calculation (min) 40 min    Activity Tolerance Patient tolerated treatment well    Behavior During Therapy WFL for tasks assessed/performed             Past Medical History:  Diagnosis Date   Acid reflux    Asthma    Cardiac dysrhythmia, unspecified    Carotid bruit    Bilateral   Cataract    Chest pain, unspecified    Chronic airway obstruction, not elsewhere classified    Chronic kidney disease    mild per patient   Chronic sinusitis    Congestive heart failure, unspecified    DDD (degenerative disc disease), cervical 07/24/2016   DDD (degenerative disc disease), lumbar 07/24/2016   Depressive disorder, not elsewhere classified    Diaphragmatic hernia without mention of obstruction or gangrene    Edema    left leg   Fibromyalgia    Hemorrhoid    High cholesterol    History of rheumatoid arthritis    Hypothyroidism    Neuropathy    Obesity, unspecified    Obstructive lung disease (generalized) (HCC)    Osteoarthritis of both hands 07/24/2016   Osteopenia    Osteoporosis 07/24/2016   Palpitations    Pseudogout involving multiple joints    Sleep apnea    Sleep apnea    per patient   SVT (supraventricular tachycardia) (HCC)    Syncope    admitted 09/2011   Thyroid disease    Unspecified essential hypertension    Past Surgical History:  Procedure Laterality Date   CARPAL TUNNEL RELEASE Bilateral    CATARACT EXTRACTION, BILATERAL     11/02/2021, 11/23/2021   CATHETER ABLATION  02/01/1997   @ [facility]   COLONOSCOPY WITH PROPOFOL N/A 08/18/2021    multiple adenomas. non-bleeding internal hemorrhoids.   INCONTINENCE SURGERY     POLYPECTOMY  08/18/2021   Procedure: POLYPECTOMY;  Surgeon: Marguerita Merles, Reuel Boom, MD;  Location: [facility];  Service: Gastroenterology;;   removal toenail Bilateral    great toes   VEIN SURGERY BLADDER     Patient Active Problem List   Diagnosis Date Noted   Preop cardiovascular exam 06/20/2023   PSVT (paroxysmal supraventricular tachycardia) (HCC) 06/20/2023   Unilateral primary osteoarthritis, right hip 06/10/2023   Gastroesophageal reflux disease 02/28/2023   Rectal irritation 02/28/2023   Chronic venous insufficiency 12/14/2022   Rectal bleeding 03/19/2022   Fecal urgency 03/19/2022   Bloating 03/19/2022   Grade I hemorrhoids 12/19/2021   Ankle joint pain 12/07/2019   Low back pain 12/07/2019   Lumbar radiculopathy 12/07/2019   Pain of left upper arm 12/07/2019   Acute foot pain, left 04/15/2019   Bursitis/tendonitis, shoulder 04/23/2018   Inflammatory arthropathy 02/20/2018   Shoulder pain 02/20/2018   Pain in both knees 11/20/2017   Chondromalacia patellae, left knee 01/15/2017   Chondromalacia patellae, right knee 01/15/2017   Osteoarthritis of both hands 07/24/2016   DDD (degenerative disc disease), cervical 07/24/2016  DDD (degenerative disc disease), lumbar 07/24/2016   Osteoporosis 07/24/2016   Glaucoma 07/24/2016   Sleep apnea 07/24/2016   Pseudogout 07/19/2016   Primary osteoarthritis of both knees 07/19/2016   Fibromyalgia 07/19/2016   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   Pulmonary embolism (HCC) 01/18/2012   Anxiety 01/18/2012   Syncope    RHEUMATOID LUNG 12/30/2009   OBESITY, UNSPECIFIED 06/02/2009   DEPRESSIVE DISORDER NOT ELSEWHERE CLASSIFIED 06/02/2009   SUPRAVENTRICULAR TACHYCARDIA 06/02/2009   Chronic diastolic heart failure (HCC) 06/02/2009   COPD 06/02/2009   DIAPHRAGMAT HERN W/O MENTION OBSTRUCTION/GANGREN 06/02/2009   Palpitations 06/02/2009   CHEST PAIN  UNSPECIFIED 06/02/2009    PCP: Toma Deiters, MD  REFERRING PROVIDER: Kathryne Hitch, MD  REFERRING DIAG: M16.11 (ICD-10-CM) - Unilateral primary osteoarthritis, right hip  THERAPY DIAG:  Pain in right hip  Muscle weakness (generalized)  Difficulty in walking, not elsewhere classified  Rationale for Evaluation and Treatment: Rehabilitation  ONSET DATE: last year  SUBJECTIVE:   SUBJECTIVE STATEMENT: Patient states that she's hurting on her R hip. Rates it as a 10/10. Denies recent  falls or trauma to the hip. Patient has been doing her HEP without any issues.   EVAL: Arrives to the clinic with R hip pain and L knee pain (see below) on a standard walker but uses SPC at home. Patient reports some numbness and weakness on the legs. Patient reports of multiple episodes of falls. Condition started last year on the hip without apparent reason. Pain on the knees has been going on for years but got worse since she fell almost 2 weeks ago. Patient has been taking oxycodone for pain which doesn't help that much. Patient has fallen almost 2 weeks ago. X-ray of the hip was unremarkable. Recently, patient was referred to outpatient PT evaluation and management.   PERTINENT HISTORY: Gout, pseudogout, low back pain PAIN:  Are you having pain? Yes: NPRS scale: 8/10 on R hip, 3/10 L knee Pain location: R hip pain (groin shoots to the front of thigh and side) and L knee pain (front/back) Pain description: sharp Aggravating factors: standing 15-20 minutes, night Relieving factors: pain meds (oxycodone)  PRECAUTIONS: Fall  RED FLAGS: None   WEIGHT BEARING RESTRICTIONS: No  FALLS:  Has patient fallen in last 6 months? Yes. Number of falls 6 (most recent was 2 weeks ago)  LIVING ENVIRONMENT: Lives with:  sister Lives in: House/apartment Stairs: Yes: Internal: 11 steps; on left going up and External: 0 steps; n/a Has following equipment at home: Single point cane, Walker - 2  wheeled, and shower chair  OCCUPATION: retired  PLOF: Independent and Independent with basic ADLs  PATIENT GOALS: "for my hip to get better"  NEXT MD VISIT: March 2025  OBJECTIVE:  Note: Objective measures were completed at Evaluation unless otherwise noted.  DIAGNOSTIC FINDINGS:  06/10/23 An AP pelvis and lateral the right hip shows severe end-stage arthritis of  the right hip with loss of joint space, near bone-on-bone wear, cystic  changes and sclerotic changes.  There is moderate arthritis of the left  hip.  PATIENT SURVEYS:  ABC scale 250/1600 = 15.2%  COGNITION: Overall cognitive status:  slightly slow to answer to questions      SENSATION: Not tested  MUSCLE LENGTH: Hamstrings: moderate restriction on B Gastrocnemius: moderate restriction on B  POSTURE: rounded shoulders, forward head, and decreased lumbar lordosis   LOWER EXTREMITY ROM:  Active ROM Right eval Left eval  Hip flexion [facility]  Hip extension  Hip abduction    Hip adduction    Hip internal rotation    Hip external rotation    Knee flexion [facility] WFL  Knee extension [facility]  Ankle dorsiflexion [facility]  Ankle plantarflexion [facility] WFL  Ankle inversion    Ankle eversion     (Blank rows = not tested)  LOWER EXTREMITY MMT:  MMT Right eval Left eval  Hip flexion 3+ 4-  Hip extension    Hip abduction 4- 4-  Hip adduction    Hip internal rotation    Hip external rotation    Knee flexion 3+ 4-  Knee extension 4- 4-  Ankle dorsiflexion 4 4-  Ankle plantarflexion 4 4-  Ankle inversion    Ankle eversion     (Blank rows = not tested)  FUNCTIONAL TESTS:  Timed up and go (TUG): to be determined 2 minute walk test: 47 ft 30 sec chair stand test: 2 reps with use of UE  GAIT: Distance walked: 47 ft Assistive device utilized:  standard walker Level of assistance: Modified independence Comments: done during , stooped posture, slow cadence, decreased step length on B, decreased swing  phase on B                                                                                                                                TREATMENT DATE:  10/16/23 Recumbent bike, seat 9, level 1 x 5' Seated hamstring stretch x 30" x 3 Sidelying R piriformis stretch x 30" x 3 Seated marches x 10 x 2   10/02/23 TUG test 1:02.47" with RW Goal review/POC  Standing in // bars with bil UE assist heel raises 10X Toe raises 10X Hip abduction 10X each Sit to stands no UE assist 2X5  09/25/23 Evaluation and patient education done    PATIENT EDUCATION:  Education details: Educated on the pathoanatomy of hip pain. Educated on the goals and course of rehab. Educated on measures to reduce falls at home. Person educated: Patient Education method: Explanation Education comprehension: needs further education  HOME EXERCISE PROGRAM:   Access Code: [email or web address] URL: [email or web address] 10/16/2023 - Seated Hamstring Stretch  - 2-3 x daily - 7 x weekly - 3 reps - 30 hold - Sidelying Piriformis Stretch  - 2-3 x daily - 7 x weekly - 3 reps - 30 hold - Seated March  - 2 x daily - 7 x weekly - 2 sets - 10 reps - Standing Hip Flexor Stretch  - 2 x daily - 7 x weekly - 3 reps - 30 hold  Date: 10/02/2023 Prepared by: Emeline Gins Exercises - Standing Heel Raises  - 2 x daily - 7 x weekly - 10 reps - Heel Toe Raises with Counter Support  - 2 x daily - 7 x weekly - 10 reps - Standing Hip Abduction with Anterior Support  - 2 x daily - 7 x weekly - 10 reps - Sit to  Stand Without Arm Support  - 2 x daily - 7 x weekly - 10 reps   ASSESSMENT:  CLINICAL IMPRESSION: Interventions today were geared towards LE strengthening and flexibility. Tolerated all activities without worsening of symptoms. Patient reported partial relief of pain from 10/10 to 8/10 after the bike. Demonstrated mild levels of fatigue. Pacing of activities was slow. Rest periods provided. SpO2 = 97 and HR = 81 during the  session SpO2 = 95 and HR = 76 at the end session. Provided slight amount of cueing to ensure correct execution of activity with fair to good carry-over. Pain decreased to a 7/10 at the end of session. To date, skilled PT is required to address the impairments and improve function.   EVAL: Patient is a 73 y.o. female who was seen today for physical therapy evaluation and treatment for R hip pain. Patient's condition is further defined by difficulty with walking and standing due to pain, weakness, and decreased soft tissue extensibility. Skilled PT is required to address the impairments and functional limitations listed below. Patient fell almost 2 weeks ago and has been complaining of knee pain. With this, patient's knee may need to be further examined by referring provider or PCP for evaluation and management.  OBJECTIVE IMPAIRMENTS: Abnormal gait, decreased activity tolerance, decreased balance, decreased mobility, difficulty walking, decreased strength, and impaired flexibility.   ACTIVITY LIMITATIONS: carrying, lifting, bending, standing, squatting, stairs, and transfers  PARTICIPATION LIMITATIONS: meal prep, cleaning, laundry, driving, shopping, community activity, and yard work  PERSONAL FACTORS: Age, Time since onset of injury/illness/exacerbation, and 3+ comorbidities: Gout, pseudogout, low back pain  are also affecting patient's functional outcome.   REHAB POTENTIAL: Fair    CLINICAL DECISION MAKING: Unstable/unpredictable  EVALUATION COMPLEXITY: High   GOALS: Goals reviewed with patient? Yes  SHORT TERM GOALS: Target date: 10/09/23 Pt will demonstrate indep in HEP to facilitate carry-over of skilled services and improve functional outcomes  LONG TERM GOALS: Target date: 10/23/23  Pt will improve ABC scale score by 30% in order to demonstrate improved confidence with balance and safety during ambulation and other ADLs  Baseline: 15.2% Goal status: INITIAL  2.  Pt will be able to  stand up 5x in the 30 sec chair-to-stand test to demonstrate clinically significant improvement in balance and LE strength.   Baseline: 2x Goal status: INITIAL  3.  Pt will increase by at least 40 ft in order to demonstrate clinically significant improvement in community ambulation  Baseline: 47 ft Goal status: INITIAL  4.  Pt will demonstrate increase in LE strength to 4-/5 to facilitate ease and safety in ambulation  Baseline: 3+/5 Goal status: INITIAL  5.  Pt will have a decrease in TUG score by at least 3 sec in order to demonstrate clinically significant improvement in community ambulation Baseline: to be determined Goal status: INITIAL  6. Pt will be able to tolerate prolonged periods of standing (>25 minutes) mild pain (4-5/10) to facilitate ease in ADLs Baseline: 8/10 Goal status: INITIAL  PLAN:  PT FREQUENCY: 1x/week  PT DURATION: 4 weeks  PLANNED INTERVENTIONS: 97164- PT Re-evaluation, 97110-Therapeutic exercises, 97530- Therapeutic activity, 97112- Neuromuscular re-education, 97535- Self Care, 16109- Manual therapy, [phone]- Gait training, and Patient/Family education  PLAN FOR NEXT SESSION: Progress LE flexibility, strengthening, and balance activities.   Tish Frederickson. Arnie Maiolo, PT, DPT, OCS Board-Certified Clinical Specialist in Orthopedic PT [facility] Ph: [phone] 10/16/2023, 10:32 AM

## 2023-10-17 ENCOUNTER — Encounter (INDEPENDENT_AMBULATORY_CARE_PROVIDER_SITE_OTHER): Payer: Self-pay | Admitting: Gastroenterology

## 2023-10-17 ENCOUNTER — Ambulatory Visit (INDEPENDENT_AMBULATORY_CARE_PROVIDER_SITE_OTHER): Payer: 59 | Admitting: Gastroenterology

## 2023-10-17 VITALS — BP 100/62 | HR 61 | Temp 98.4°F | Ht 59.0 in | Wt 171.1 lb

## 2023-10-17 DIAGNOSIS — K6289 Other specified diseases of anus and rectum: Secondary | ICD-10-CM

## 2023-10-17 DIAGNOSIS — K589 Irritable bowel syndrome without diarrhea: Secondary | ICD-10-CM | POA: Diagnosis not present

## 2023-10-17 DIAGNOSIS — R110 Nausea: Secondary | ICD-10-CM

## 2023-10-17 DIAGNOSIS — K649 Unspecified hemorrhoids: Secondary | ICD-10-CM | POA: Diagnosis not present

## 2023-10-17 NOTE — Patient Instructions (Signed)
Continue with Preparation H pads and topical hydrocortisone as needed for rectal itching Start Benefiber fiber supplements daily to increase stool bulk Continue with dietary intake of fiber Can continue with medication for nausea, please clarify with PCP which medication you are taking

## 2023-10-17 NOTE — Progress Notes (Signed)
Katrinka Blazing, M.D. Gastroenterology & Hepatology [facility] Gastroenterology [address] [place], [place] [address]  Primary Care Physician: Toma Deiters, MD [address] [place] [address]  I will communicate my assessment and recommendations to the referring MD via EMR.  Problems: IBS  History of Present Illness: RAMLA HASE is a 73 y.o. female with past medical history of asthma, DDD, CHF, depression, diaphragmatic hernia, fibromyalgia, high cholesterol, RA, neuropathy, OA,  who presents for evaluation of changes in bowel movement habits and IBS.  The patient was last seen on 02/28/2023. At that time, the patient was advised to use hemorrhoid cream for hemorrhoids as needed.  For management of GERD she was continued on Protonix 40 mg in the morning and famotidine at night.  Patient reports that when she has a bowel movement she has nausea and had "stomach upset". This has happened intermittently for multiple years, patient cannot really tell for how long. She reports that she takes "nausea pills" but does not remember the name of this medication.  She reports that for the last few weeks she had more frequent bowel movements, 2-3 per day with soft consistency. She reports that she has been eating small portions of food rich in fiber to avoid constipation. Only one time she had watery bowel movement. She reports that she has itching in her rectum, but no bleeding in stool. She used preparation H, which has helped making it better.  The patient denies having any nausea, vomiting, fever, chills, hematochezia, melena, hematemesis, abdominal distention, abdominal pain, diarrhea, jaundice, pruritus or weight loss.  Last Colonoscopy:Dec 2022Four 3 to 8 mm polyps in the transverse colon and in the ascending colon,  Three 3 to 8 mm polyps in the rectum, in the sigmoid colon and in the descending colon, - Non-bleeding internal hemorrhoids  Recommend  a repeat colonoscopy in 3 years  Last Endoscopy:unsure of timing, was r/t abdominal pain  Past Medical History: Past Medical History:  Diagnosis Date   Acid reflux    Asthma    Cardiac dysrhythmia, unspecified    Carotid bruit    Bilateral   Cataract    Chest pain, unspecified    Chronic airway obstruction, not elsewhere classified    Chronic kidney disease    mild per patient   Chronic sinusitis    Congestive heart failure, unspecified    DDD (degenerative disc disease), cervical 07/24/2016   DDD (degenerative disc disease), lumbar 07/24/2016   Depressive disorder, not elsewhere classified    Diaphragmatic hernia without mention of obstruction or gangrene    Edema    left leg   Fibromyalgia    Hemorrhoid    High cholesterol    History of rheumatoid arthritis    Hypothyroidism    Neuropathy    Obesity, unspecified    Obstructive lung disease (generalized) (HCC)    Osteoarthritis of both hands 07/24/2016   Osteopenia    Osteoporosis 07/24/2016   Palpitations    Pseudogout involving multiple joints    Sleep apnea    Sleep apnea    per patient   SVT (supraventricular tachycardia) (HCC)    Syncope    admitted 09/2011   Thyroid disease    Unspecified essential hypertension     Past Surgical History: Past Surgical History:  Procedure Laterality Date   CARPAL TUNNEL RELEASE Bilateral    CATARACT EXTRACTION, BILATERAL     11/02/2021, 11/23/2021   CATHETER ABLATION  02/01/1997   @  [facility]   COLONOSCOPY WITH PROPOFOL N/A 08/18/2021   multiple adenomas. non-bleeding internal hemorrhoids.   INCONTINENCE SURGERY     POLYPECTOMY  08/18/2021   Procedure: POLYPECTOMY;  Surgeon: Marguerita Merles, Reuel Boom, MD;  Location: [facility];  Service: Gastroenterology;;   removal toenail Bilateral    great toes   VEIN SURGERY BLADDER      Family History: Family History  Problem Relation Age of Onset   Heart disease Mother    Hypertension Mother    Stroke Other    Heart  disease Father    Hypertension Father    Kidney failure Sister    Asthma Sister     Social History: Social History   Tobacco Use  Smoking Status Never   Passive exposure: Current  Smokeless Tobacco Never   Social History   Substance and Sexual Activity  Alcohol Use No   Alcohol/week: 0.0 standard drinks of alcohol   Social History   Substance and Sexual Activity  Drug Use Never    Allergies: Allergies  Allergen Reactions   Nitroglycerin     headaches   Other     Heart monitor pads causes blisters    Adhesive [Tape] Rash   Codeine Nausea And Vomiting   Latex Rash    Medications: Current Outpatient Medications  Medication Sig Dispense Refill   albuterol (ACCUNEB) 1.25 MG/3ML nebulizer solution Take 1 ampule by nebulization in the morning, at noon, in the evening, and at bedtime.     albuterol (PROVENTIL HFA;VENTOLIN HFA) 108 (90 Base) MCG/ACT inhaler Inhale 2 puffs into the lungs every 6 (six) hours as needed for wheezing or shortness of breath.     ascorbic acid (VITAMIN C) 500 MG tablet Take 500 mg by mouth daily.     atorvastatin (LIPITOR) 40 MG tablet Take 40 mg by mouth daily.     clonazePAM (KLONOPIN) 0.5 MG tablet Take 0.5 mg by mouth at bedtime.      COLCRYS 0.6 MG tablet TAKE 1 TABLET BY MOUTH ONCE DAILY. (Patient taking differently: Take 0.6 mg by mouth daily.) 30 tablet 0   cycloSPORINE (RESTASIS) 0.05 % ophthalmic emulsion Place 1 drop into both eyes 2 (two) times daily.     dicyclomine (BENTYL) 10 MG capsule TAKE 1 CAPSULE BY MOUTH 3 TIMES DAILY AS NEEDED FOR SPASMS. 90 capsule 0   empagliflozin (JARDIANCE) 10 MG TABS tablet Take 1 tablet (10 mg total) by mouth daily before breakfast. 30 tablet 11   famotidine (PEPCID) 20 MG tablet Take 20 mg by mouth daily as needed for heartburn.     fexofenadine (ALLEGRA) 180 MG tablet Take 180 mg by mouth daily.     fluticasone (FLONASE) 50 MCG/ACT nasal spray Place 2 sprays into both nostrils daily.     furosemide  (LASIX) 40 MG tablet TAKE 1 TABLET BY MOUTH ONCE DAILY. 30 tablet 6   gabapentin (NEURONTIN) 300 MG capsule Take 300 mg by mouth 3 (three) times daily.      guaiFENesin (MUCINEX) 600 MG 12 hr tablet Take 600 mg by mouth 2 (two) times daily as needed for to loosen phlegm.     hydrocortisone (ANUSOL-HC) 2.5 % rectal cream Place 1 application rectally 2 (two) times daily. Twice daily x 10 days then up to twice daily as needed for itching/irritation from hemorrhoids (Patient taking differently: Place 1 application  rectally daily as needed for hemorrhoids. Twice daily x 10 days then up to twice daily as needed for itching/irritation from hemorrhoids) 30  g 1   levothyroxine (SYNTHROID, LEVOTHROID) 50 MCG tablet Take 50 mcg by mouth daily before breakfast.      loperamide (IMODIUM A-D) 2 MG tablet Take 2 mg by mouth 4 (four) times daily as needed for diarrhea or loose stools.     metoprolol tartrate (LOPRESSOR) 25 MG tablet Take 25 mg by mouth daily.     montelukast (SINGULAIR) 10 MG tablet Take 10 mg by mouth daily.     naloxone (NARCAN) 4 MG/0.1ML LIQD nasal spray kit Place 0.4 mg into the nose daily as needed (OR).     nystatin (MYCOSTATIN) powder Apply 1 Application topically daily as needed (Rash).     olmesartan (BENICAR) 20 MG tablet Take 20 mg by mouth daily.     oxyCODONE (OXY IR/ROXICODONE) 5 MG immediate release tablet Take 5 mg by mouth 2 (two) times daily.     pantoprazole (PROTONIX) 40 MG tablet take 1 tablet daily before breakfast. 90 tablet 0   PARoxetine (PAXIL) 30 MG tablet Take 30 mg by mouth daily.     potassium chloride (K-DUR) 10 MEQ tablet Take 3 tablets (30 mEq total) by mouth daily. 90 tablet 6   senna (SENOKOT) 8.6 MG tablet Take 2 tablets by mouth 2 (two) times daily as needed for constipation.     Simethicone 125 MG CAPS Take 2 capsules by mouth daily with supper.     SYMBICORT 160-4.5 MCG/ACT inhaler Inhale 2 puffs into the lungs 2 (two) times daily.     warfarin (COUMADIN) 2  MG tablet Take 2 mg by mouth daily at 4 PM.  Managed by PCP     Zoledronic Acid (RECLAST IV) Inject into the vein See admin instructions. Dose: 08/08/2022 takes once a year     Elastic Bandages & Supports (MEDICAL COMPRESSION STOCKINGS) MISC 1 each by Does not apply route as directed. 1 pair low pressure knee high compression stockings Dx: leg edema (Patient not taking: Reported on 10/17/2023) 1 each 0   No current facility-administered medications for this visit.    Review of Systems: GENERAL: negative for malaise, night sweats HEENT: No changes in hearing or vision, no nose bleeds or other nasal problems. NECK: Negative for lumps, goiter, pain and significant neck swelling RESPIRATORY: Negative for cough, wheezing CARDIOVASCULAR: Negative for chest pain, leg swelling, palpitations, orthopnea GI: SEE HPI MUSCULOSKELETAL: Negative for joint pain or swelling, back pain, and muscle pain. SKIN: Negative for lesions, rash PSYCH: Negative for sleep disturbance, mood disorder and recent psychosocial stressors. HEMATOLOGY Negative for prolonged bleeding, bruising easily, and swollen nodes. ENDOCRINE: Negative for cold or heat intolerance, polyuria, polydipsia and goiter. NEURO: negative for tremor, gait imbalance, syncope and seizures. The remainder of the review of systems is noncontributory.   Physical Exam: BP 100/62 (BP Location: Left Arm, Patient Position: Sitting, Cuff Size: Large)   Pulse 61   Temp 98.4 F (36.9 C) (Temporal)   Ht 4\' 11"  (1.499 m)   Wt 171 lb 1.6 oz (77.6 kg)   BMI 34.56 kg/m  GENERAL: The patient is AO x3, in no acute distress.  Uses walker. HEENT: Head is normocephalic and atraumatic. EOMI are intact. Mouth is well hydrated and without lesions. NECK: Supple. No masses LUNGS: Clear to auscultation. No presence of rhonchi/wheezing/rales. Adequate chest expansion HEART: RRR, normal s1 and s2. ABDOMEN: Soft, nontender, no guarding, no peritoneal signs, and  nondistended. BS +. No masses. EXTREMITIES: Without any cyanosis, clubbing, rash, lesions or edema. NEUROLOGIC: AOx3, no focal motor deficit.  SKIN: no jaundice, no rashes  Imaging/Labs: as above  I personally reviewed and interpreted the available labs, imaging and endoscopic files.  Impression and Plan: JIAYI LENGACHER is a 73 y.o. female with past medical history of asthma, DDD, CHF, depression, diaphragmatic hernia, fibromyalgia, high cholesterol, RA, neuropathy, OA,  who presents for evaluation of changes in bowel movement habits and IBS.  The patient has presented chronic changes in her bowel movements for multiple years and abdominal complaints which are likely related to IBS.  Symptoms have not changed in severity but when she has flares of the symptoms she may have perianal discomfort due to passing bowel movements.  She has had improvement of her symptoms with topical measures as her symptoms are likely related to symptomatic hemorrhoids.  As she is feeling better she will continue using Preparation H and topical hydrocortisone as needed.  To decrease the amount of bowel movements she is presenting, she can increase the intake of supplemental fiber orally.  -Continue with Preparation H pads and topical hydrocortisone as needed for rectal itching -Start Benefiber fiber supplements daily to increase stool bulk -Continue with dietary intake of fiber -Can continue with medication for nausea, please clarify with PCP which medication you are taking  -Continue with Preparation H pads and topical hydrocortisone as needed for rectal itching -Start Benefiber fiber supplements daily to increase stool bulk -Continue with dietary intake of fiber -Can continue with medication for nausea, please clarify with PCP which medication you are taking  All questions were answered.      Katrinka Blazing, MD Gastroenterology and Hepatology [facility] Gastroenterology

## 2023-10-21 DIAGNOSIS — E7849 Other hyperlipidemia: Secondary | ICD-10-CM | POA: Diagnosis not present

## 2023-10-21 DIAGNOSIS — E039 Hypothyroidism, unspecified: Secondary | ICD-10-CM | POA: Diagnosis not present

## 2023-10-21 DIAGNOSIS — J302 Other seasonal allergic rhinitis: Secondary | ICD-10-CM | POA: Diagnosis not present

## 2023-10-21 DIAGNOSIS — M5459 Other low back pain: Secondary | ICD-10-CM | POA: Diagnosis not present

## 2023-10-21 DIAGNOSIS — I10 Essential (primary) hypertension: Secondary | ICD-10-CM | POA: Diagnosis not present

## 2023-10-21 DIAGNOSIS — N1832 Chronic kidney disease, stage 3b: Secondary | ICD-10-CM | POA: Diagnosis not present

## 2023-10-21 DIAGNOSIS — J4520 Mild intermittent asthma, uncomplicated: Secondary | ICD-10-CM | POA: Diagnosis not present

## 2023-10-21 DIAGNOSIS — G4739 Other sleep apnea: Secondary | ICD-10-CM | POA: Diagnosis not present

## 2023-10-22 DIAGNOSIS — I110 Hypertensive heart disease with heart failure: Secondary | ICD-10-CM | POA: Diagnosis not present

## 2023-10-22 DIAGNOSIS — Z79899 Other long term (current) drug therapy: Secondary | ICD-10-CM | POA: Diagnosis not present

## 2023-10-22 DIAGNOSIS — Z7901 Long term (current) use of anticoagulants: Secondary | ICD-10-CM | POA: Diagnosis not present

## 2023-10-22 DIAGNOSIS — H9319 Tinnitus, unspecified ear: Secondary | ICD-10-CM | POA: Diagnosis not present

## 2023-10-22 DIAGNOSIS — M109 Gout, unspecified: Secondary | ICD-10-CM | POA: Diagnosis not present

## 2023-10-22 DIAGNOSIS — J45909 Unspecified asthma, uncomplicated: Secondary | ICD-10-CM | POA: Diagnosis not present

## 2023-10-22 DIAGNOSIS — G238 Other specified degenerative diseases of basal ganglia: Secondary | ICD-10-CM | POA: Diagnosis not present

## 2023-10-22 DIAGNOSIS — M25551 Pain in right hip: Secondary | ICD-10-CM | POA: Diagnosis not present

## 2023-10-22 DIAGNOSIS — Z9981 Dependence on supplemental oxygen: Secondary | ICD-10-CM | POA: Diagnosis not present

## 2023-10-22 DIAGNOSIS — Z7983 Long term (current) use of bisphosphonates: Secondary | ICD-10-CM | POA: Diagnosis not present

## 2023-10-22 DIAGNOSIS — R55 Syncope and collapse: Secondary | ICD-10-CM | POA: Diagnosis not present

## 2023-10-22 DIAGNOSIS — R278 Other lack of coordination: Secondary | ICD-10-CM | POA: Diagnosis not present

## 2023-10-22 DIAGNOSIS — R42 Dizziness and giddiness: Secondary | ICD-10-CM | POA: Diagnosis not present

## 2023-10-22 DIAGNOSIS — E039 Hypothyroidism, unspecified: Secondary | ICD-10-CM | POA: Diagnosis not present

## 2023-10-22 DIAGNOSIS — K219 Gastro-esophageal reflux disease without esophagitis: Secondary | ICD-10-CM | POA: Diagnosis not present

## 2023-10-22 DIAGNOSIS — J449 Chronic obstructive pulmonary disease, unspecified: Secondary | ICD-10-CM | POA: Diagnosis not present

## 2023-10-22 DIAGNOSIS — N179 Acute kidney failure, unspecified: Secondary | ICD-10-CM | POA: Diagnosis not present

## 2023-10-22 DIAGNOSIS — M19021 Primary osteoarthritis, right elbow: Secondary | ICD-10-CM | POA: Diagnosis not present

## 2023-10-22 DIAGNOSIS — E441 Mild protein-calorie malnutrition: Secondary | ICD-10-CM | POA: Diagnosis not present

## 2023-10-22 DIAGNOSIS — Z7984 Long term (current) use of oral hypoglycemic drugs: Secondary | ICD-10-CM | POA: Diagnosis not present

## 2023-10-22 DIAGNOSIS — M797 Fibromyalgia: Secondary | ICD-10-CM | POA: Diagnosis not present

## 2023-10-22 DIAGNOSIS — H269 Unspecified cataract: Secondary | ICD-10-CM | POA: Diagnosis not present

## 2023-10-22 DIAGNOSIS — Z8679 Personal history of other diseases of the circulatory system: Secondary | ICD-10-CM | POA: Diagnosis not present

## 2023-10-22 DIAGNOSIS — M1990 Unspecified osteoarthritis, unspecified site: Secondary | ICD-10-CM | POA: Diagnosis not present

## 2023-10-22 DIAGNOSIS — I959 Hypotension, unspecified: Secondary | ICD-10-CM | POA: Diagnosis not present

## 2023-10-22 DIAGNOSIS — M25521 Pain in right elbow: Secondary | ICD-10-CM | POA: Diagnosis not present

## 2023-10-22 DIAGNOSIS — W19XXXA Unspecified fall, initial encounter: Secondary | ICD-10-CM | POA: Diagnosis not present

## 2023-10-22 DIAGNOSIS — M810 Age-related osteoporosis without current pathological fracture: Secondary | ICD-10-CM | POA: Diagnosis not present

## 2023-10-22 DIAGNOSIS — Z888 Allergy status to other drugs, medicaments and biological substances status: Secondary | ICD-10-CM | POA: Diagnosis not present

## 2023-10-22 DIAGNOSIS — M25511 Pain in right shoulder: Secondary | ICD-10-CM | POA: Diagnosis not present

## 2023-10-22 DIAGNOSIS — M19011 Primary osteoarthritis, right shoulder: Secondary | ICD-10-CM | POA: Diagnosis not present

## 2023-10-22 DIAGNOSIS — N390 Urinary tract infection, site not specified: Secondary | ICD-10-CM | POA: Diagnosis not present

## 2023-10-22 DIAGNOSIS — Z91040 Latex allergy status: Secondary | ICD-10-CM | POA: Diagnosis not present

## 2023-10-22 DIAGNOSIS — E785 Hyperlipidemia, unspecified: Secondary | ICD-10-CM | POA: Diagnosis not present

## 2023-10-22 DIAGNOSIS — I5032 Chronic diastolic (congestive) heart failure: Secondary | ICD-10-CM | POA: Diagnosis not present

## 2023-10-22 DIAGNOSIS — S0990XA Unspecified injury of head, initial encounter: Secondary | ICD-10-CM | POA: Diagnosis not present

## 2023-10-22 DIAGNOSIS — R2689 Other abnormalities of gait and mobility: Secondary | ICD-10-CM | POA: Diagnosis not present

## 2023-10-22 DIAGNOSIS — W19XXXD Unspecified fall, subsequent encounter: Secondary | ICD-10-CM | POA: Diagnosis not present

## 2023-10-22 DIAGNOSIS — Z86718 Personal history of other venous thrombosis and embolism: Secondary | ICD-10-CM | POA: Diagnosis not present

## 2023-10-22 DIAGNOSIS — R0981 Nasal congestion: Secondary | ICD-10-CM | POA: Diagnosis not present

## 2023-10-22 DIAGNOSIS — I10 Essential (primary) hypertension: Secondary | ICD-10-CM | POA: Diagnosis not present

## 2023-10-22 DIAGNOSIS — Z8616 Personal history of COVID-19: Secondary | ICD-10-CM | POA: Diagnosis not present

## 2023-10-22 DIAGNOSIS — U071 COVID-19: Secondary | ICD-10-CM | POA: Diagnosis not present

## 2023-10-22 DIAGNOSIS — M1611 Unilateral primary osteoarthritis, right hip: Secondary | ICD-10-CM | POA: Diagnosis not present

## 2023-10-22 DIAGNOSIS — M6281 Muscle weakness (generalized): Secondary | ICD-10-CM | POA: Diagnosis not present

## 2023-10-23 ENCOUNTER — Telehealth (HOSPITAL_COMMUNITY): Payer: Self-pay

## 2023-10-23 ENCOUNTER — Encounter (HOSPITAL_COMMUNITY): Payer: 59

## 2023-10-23 DIAGNOSIS — E039 Hypothyroidism, unspecified: Secondary | ICD-10-CM | POA: Diagnosis not present

## 2023-10-23 DIAGNOSIS — I5032 Chronic diastolic (congestive) heart failure: Secondary | ICD-10-CM | POA: Diagnosis not present

## 2023-10-23 DIAGNOSIS — R55 Syncope and collapse: Secondary | ICD-10-CM | POA: Diagnosis not present

## 2023-10-23 DIAGNOSIS — U071 COVID-19: Secondary | ICD-10-CM | POA: Diagnosis not present

## 2023-10-23 NOTE — Telephone Encounter (Signed)
 Called patient today for her 1st no show. Patient did not answer and left a voice message. Informed patient about the facility's policies on cancellation/no shows and that she can call if she has questions. Also reminded patient to make future appointments.  Tish Frederickson. Vinette Crites, PT, DPT, OCS Board-Certified Clinical Specialist in Orthopedic PT PT Compact Privilege # ([place]): [phone] T

## 2023-10-24 DIAGNOSIS — I959 Hypotension, unspecified: Secondary | ICD-10-CM | POA: Diagnosis not present

## 2023-10-24 DIAGNOSIS — E039 Hypothyroidism, unspecified: Secondary | ICD-10-CM | POA: Diagnosis not present

## 2023-10-24 DIAGNOSIS — R55 Syncope and collapse: Secondary | ICD-10-CM | POA: Diagnosis not present

## 2023-10-24 DIAGNOSIS — U071 COVID-19: Secondary | ICD-10-CM | POA: Diagnosis not present

## 2023-10-24 DIAGNOSIS — Z9981 Dependence on supplemental oxygen: Secondary | ICD-10-CM | POA: Diagnosis not present

## 2023-10-24 DIAGNOSIS — Z7984 Long term (current) use of oral hypoglycemic drugs: Secondary | ICD-10-CM | POA: Diagnosis not present

## 2023-10-24 DIAGNOSIS — Z8679 Personal history of other diseases of the circulatory system: Secondary | ICD-10-CM | POA: Diagnosis not present

## 2023-10-24 DIAGNOSIS — Z86718 Personal history of other venous thrombosis and embolism: Secondary | ICD-10-CM | POA: Diagnosis not present

## 2023-10-24 DIAGNOSIS — W19XXXA Unspecified fall, initial encounter: Secondary | ICD-10-CM | POA: Diagnosis not present

## 2023-10-25 DIAGNOSIS — Z79899 Other long term (current) drug therapy: Secondary | ICD-10-CM | POA: Diagnosis not present

## 2023-10-25 DIAGNOSIS — Z7901 Long term (current) use of anticoagulants: Secondary | ICD-10-CM | POA: Diagnosis not present

## 2023-10-25 DIAGNOSIS — W19XXXA Unspecified fall, initial encounter: Secondary | ICD-10-CM | POA: Diagnosis not present

## 2023-10-25 DIAGNOSIS — Z86718 Personal history of other venous thrombosis and embolism: Secondary | ICD-10-CM | POA: Diagnosis not present

## 2023-10-25 DIAGNOSIS — E039 Hypothyroidism, unspecified: Secondary | ICD-10-CM | POA: Diagnosis not present

## 2023-10-25 DIAGNOSIS — I5032 Chronic diastolic (congestive) heart failure: Secondary | ICD-10-CM | POA: Diagnosis not present

## 2023-10-25 DIAGNOSIS — U071 COVID-19: Secondary | ICD-10-CM | POA: Diagnosis not present

## 2023-10-25 DIAGNOSIS — R55 Syncope and collapse: Secondary | ICD-10-CM | POA: Diagnosis not present

## 2023-11-02 DIAGNOSIS — K219 Gastro-esophageal reflux disease without esophagitis: Secondary | ICD-10-CM | POA: Diagnosis not present

## 2023-11-02 DIAGNOSIS — H269 Unspecified cataract: Secondary | ICD-10-CM | POA: Diagnosis not present

## 2023-11-02 DIAGNOSIS — E039 Hypothyroidism, unspecified: Secondary | ICD-10-CM | POA: Diagnosis not present

## 2023-11-02 DIAGNOSIS — E785 Hyperlipidemia, unspecified: Secondary | ICD-10-CM | POA: Diagnosis not present

## 2023-11-02 DIAGNOSIS — R278 Other lack of coordination: Secondary | ICD-10-CM | POA: Diagnosis not present

## 2023-11-02 DIAGNOSIS — W19XXXA Unspecified fall, initial encounter: Secondary | ICD-10-CM | POA: Diagnosis not present

## 2023-11-02 DIAGNOSIS — Z86718 Personal history of other venous thrombosis and embolism: Secondary | ICD-10-CM | POA: Diagnosis not present

## 2023-11-02 DIAGNOSIS — E441 Mild protein-calorie malnutrition: Secondary | ICD-10-CM | POA: Diagnosis not present

## 2023-11-02 DIAGNOSIS — R52 Pain, unspecified: Secondary | ICD-10-CM | POA: Diagnosis not present

## 2023-11-02 DIAGNOSIS — Z8616 Personal history of COVID-19: Secondary | ICD-10-CM | POA: Diagnosis not present

## 2023-11-02 DIAGNOSIS — R55 Syncope and collapse: Secondary | ICD-10-CM | POA: Diagnosis not present

## 2023-11-02 DIAGNOSIS — Z79899 Other long term (current) drug therapy: Secondary | ICD-10-CM | POA: Diagnosis not present

## 2023-11-02 DIAGNOSIS — M810 Age-related osteoporosis without current pathological fracture: Secondary | ICD-10-CM | POA: Diagnosis not present

## 2023-11-02 DIAGNOSIS — N179 Acute kidney failure, unspecified: Secondary | ICD-10-CM | POA: Diagnosis not present

## 2023-11-02 DIAGNOSIS — R2689 Other abnormalities of gait and mobility: Secondary | ICD-10-CM | POA: Diagnosis not present

## 2023-11-02 DIAGNOSIS — J449 Chronic obstructive pulmonary disease, unspecified: Secondary | ICD-10-CM | POA: Diagnosis not present

## 2023-11-02 DIAGNOSIS — Z515 Encounter for palliative care: Secondary | ICD-10-CM | POA: Diagnosis not present

## 2023-11-02 DIAGNOSIS — I10 Essential (primary) hypertension: Secondary | ICD-10-CM | POA: Diagnosis not present

## 2023-11-02 DIAGNOSIS — Z7409 Other reduced mobility: Secondary | ICD-10-CM | POA: Diagnosis not present

## 2023-11-02 DIAGNOSIS — W19XXXD Unspecified fall, subsequent encounter: Secondary | ICD-10-CM | POA: Diagnosis not present

## 2023-11-02 DIAGNOSIS — R5381 Other malaise: Secondary | ICD-10-CM | POA: Diagnosis not present

## 2023-11-02 DIAGNOSIS — R0982 Postnasal drip: Secondary | ICD-10-CM | POA: Diagnosis not present

## 2023-11-02 DIAGNOSIS — Z7901 Long term (current) use of anticoagulants: Secondary | ICD-10-CM | POA: Diagnosis not present

## 2023-11-02 DIAGNOSIS — E559 Vitamin D deficiency, unspecified: Secondary | ICD-10-CM | POA: Diagnosis not present

## 2023-11-02 DIAGNOSIS — M109 Gout, unspecified: Secondary | ICD-10-CM | POA: Diagnosis not present

## 2023-11-02 DIAGNOSIS — R0981 Nasal congestion: Secondary | ICD-10-CM | POA: Diagnosis not present

## 2023-11-02 DIAGNOSIS — J45909 Unspecified asthma, uncomplicated: Secondary | ICD-10-CM | POA: Diagnosis not present

## 2023-11-02 DIAGNOSIS — M6281 Muscle weakness (generalized): Secondary | ICD-10-CM | POA: Diagnosis not present

## 2023-11-02 DIAGNOSIS — M797 Fibromyalgia: Secondary | ICD-10-CM | POA: Diagnosis not present

## 2023-11-02 DIAGNOSIS — U071 COVID-19: Secondary | ICD-10-CM | POA: Diagnosis not present

## 2023-11-02 DIAGNOSIS — N390 Urinary tract infection, site not specified: Secondary | ICD-10-CM | POA: Diagnosis not present

## 2023-11-02 DIAGNOSIS — R5383 Other fatigue: Secondary | ICD-10-CM | POA: Diagnosis not present

## 2023-11-02 DIAGNOSIS — I5032 Chronic diastolic (congestive) heart failure: Secondary | ICD-10-CM | POA: Diagnosis not present

## 2023-11-04 DIAGNOSIS — U071 COVID-19: Secondary | ICD-10-CM | POA: Diagnosis not present

## 2023-11-04 DIAGNOSIS — R5381 Other malaise: Secondary | ICD-10-CM | POA: Diagnosis not present

## 2023-11-04 DIAGNOSIS — N390 Urinary tract infection, site not specified: Secondary | ICD-10-CM | POA: Diagnosis not present

## 2023-11-06 ENCOUNTER — Ambulatory Visit: Payer: 59 | Admitting: Orthopaedic Surgery

## 2023-11-06 DIAGNOSIS — J449 Chronic obstructive pulmonary disease, unspecified: Secondary | ICD-10-CM | POA: Diagnosis not present

## 2023-11-06 DIAGNOSIS — R5381 Other malaise: Secondary | ICD-10-CM | POA: Diagnosis not present

## 2023-11-06 DIAGNOSIS — U071 COVID-19: Secondary | ICD-10-CM | POA: Diagnosis not present

## 2023-11-06 DIAGNOSIS — M797 Fibromyalgia: Secondary | ICD-10-CM | POA: Diagnosis not present

## 2023-11-06 DIAGNOSIS — M810 Age-related osteoporosis without current pathological fracture: Secondary | ICD-10-CM | POA: Diagnosis not present

## 2023-11-06 DIAGNOSIS — I10 Essential (primary) hypertension: Secondary | ICD-10-CM | POA: Diagnosis not present

## 2023-11-06 DIAGNOSIS — N390 Urinary tract infection, site not specified: Secondary | ICD-10-CM | POA: Diagnosis not present

## 2023-11-06 DIAGNOSIS — N179 Acute kidney failure, unspecified: Secondary | ICD-10-CM | POA: Diagnosis not present

## 2023-11-06 DIAGNOSIS — I5032 Chronic diastolic (congestive) heart failure: Secondary | ICD-10-CM | POA: Diagnosis not present

## 2023-11-07 DIAGNOSIS — R52 Pain, unspecified: Secondary | ICD-10-CM | POA: Diagnosis not present

## 2023-11-07 DIAGNOSIS — R5383 Other fatigue: Secondary | ICD-10-CM | POA: Diagnosis not present

## 2023-11-07 DIAGNOSIS — Z7409 Other reduced mobility: Secondary | ICD-10-CM | POA: Diagnosis not present

## 2023-11-07 DIAGNOSIS — I5032 Chronic diastolic (congestive) heart failure: Secondary | ICD-10-CM | POA: Diagnosis not present

## 2023-11-07 DIAGNOSIS — Z515 Encounter for palliative care: Secondary | ICD-10-CM | POA: Diagnosis not present

## 2023-11-07 DIAGNOSIS — Z8616 Personal history of COVID-19: Secondary | ICD-10-CM | POA: Diagnosis not present

## 2023-11-07 DIAGNOSIS — R0982 Postnasal drip: Secondary | ICD-10-CM | POA: Diagnosis not present

## 2023-11-07 DIAGNOSIS — J449 Chronic obstructive pulmonary disease, unspecified: Secondary | ICD-10-CM | POA: Diagnosis not present

## 2023-11-08 DIAGNOSIS — Z7901 Long term (current) use of anticoagulants: Secondary | ICD-10-CM | POA: Diagnosis not present

## 2023-11-08 DIAGNOSIS — I5032 Chronic diastolic (congestive) heart failure: Secondary | ICD-10-CM | POA: Diagnosis not present

## 2023-11-08 DIAGNOSIS — Z86718 Personal history of other venous thrombosis and embolism: Secondary | ICD-10-CM | POA: Diagnosis not present

## 2023-11-12 DIAGNOSIS — I5032 Chronic diastolic (congestive) heart failure: Secondary | ICD-10-CM | POA: Diagnosis not present

## 2023-11-12 DIAGNOSIS — Z86718 Personal history of other venous thrombosis and embolism: Secondary | ICD-10-CM | POA: Diagnosis not present

## 2023-11-12 DIAGNOSIS — E559 Vitamin D deficiency, unspecified: Secondary | ICD-10-CM | POA: Diagnosis not present

## 2023-11-12 DIAGNOSIS — Z7901 Long term (current) use of anticoagulants: Secondary | ICD-10-CM | POA: Diagnosis not present

## 2023-11-19 DIAGNOSIS — Z86718 Personal history of other venous thrombosis and embolism: Secondary | ICD-10-CM | POA: Diagnosis not present

## 2023-11-19 DIAGNOSIS — Z7901 Long term (current) use of anticoagulants: Secondary | ICD-10-CM | POA: Diagnosis not present

## 2023-11-19 DIAGNOSIS — I5032 Chronic diastolic (congestive) heart failure: Secondary | ICD-10-CM | POA: Diagnosis not present

## 2023-11-27 DIAGNOSIS — I10 Essential (primary) hypertension: Secondary | ICD-10-CM | POA: Diagnosis not present

## 2023-11-27 DIAGNOSIS — U099 Post covid-19 condition, unspecified: Secondary | ICD-10-CM | POA: Diagnosis not present

## 2023-12-01 DIAGNOSIS — I110 Hypertensive heart disease with heart failure: Secondary | ICD-10-CM | POA: Diagnosis not present

## 2023-12-01 DIAGNOSIS — Z7951 Long term (current) use of inhaled steroids: Secondary | ICD-10-CM | POA: Diagnosis not present

## 2023-12-01 DIAGNOSIS — I5032 Chronic diastolic (congestive) heart failure: Secondary | ICD-10-CM | POA: Diagnosis not present

## 2023-12-01 DIAGNOSIS — J4489 Other specified chronic obstructive pulmonary disease: Secondary | ICD-10-CM | POA: Diagnosis not present

## 2023-12-01 DIAGNOSIS — E785 Hyperlipidemia, unspecified: Secondary | ICD-10-CM | POA: Diagnosis not present

## 2023-12-01 DIAGNOSIS — K219 Gastro-esophageal reflux disease without esophagitis: Secondary | ICD-10-CM | POA: Diagnosis not present

## 2023-12-01 DIAGNOSIS — Z9181 History of falling: Secondary | ICD-10-CM | POA: Diagnosis not present

## 2023-12-01 DIAGNOSIS — Z7901 Long term (current) use of anticoagulants: Secondary | ICD-10-CM | POA: Diagnosis not present

## 2023-12-01 DIAGNOSIS — H269 Unspecified cataract: Secondary | ICD-10-CM | POA: Diagnosis not present

## 2023-12-01 DIAGNOSIS — Z8616 Personal history of COVID-19: Secondary | ICD-10-CM | POA: Diagnosis not present

## 2023-12-01 DIAGNOSIS — M797 Fibromyalgia: Secondary | ICD-10-CM | POA: Diagnosis not present

## 2023-12-01 DIAGNOSIS — Z79899 Other long term (current) drug therapy: Secondary | ICD-10-CM | POA: Diagnosis not present

## 2023-12-01 DIAGNOSIS — M109 Gout, unspecified: Secondary | ICD-10-CM | POA: Diagnosis not present

## 2023-12-01 DIAGNOSIS — E039 Hypothyroidism, unspecified: Secondary | ICD-10-CM | POA: Diagnosis not present

## 2023-12-01 DIAGNOSIS — Z87440 Personal history of urinary (tract) infections: Secondary | ICD-10-CM | POA: Diagnosis not present

## 2023-12-01 DIAGNOSIS — Z86718 Personal history of other venous thrombosis and embolism: Secondary | ICD-10-CM | POA: Diagnosis not present

## 2023-12-03 DIAGNOSIS — Z8616 Personal history of COVID-19: Secondary | ICD-10-CM | POA: Diagnosis not present

## 2023-12-03 DIAGNOSIS — Z87440 Personal history of urinary (tract) infections: Secondary | ICD-10-CM | POA: Diagnosis not present

## 2023-12-03 DIAGNOSIS — H269 Unspecified cataract: Secondary | ICD-10-CM | POA: Diagnosis not present

## 2023-12-03 DIAGNOSIS — E785 Hyperlipidemia, unspecified: Secondary | ICD-10-CM | POA: Diagnosis not present

## 2023-12-03 DIAGNOSIS — Z9181 History of falling: Secondary | ICD-10-CM | POA: Diagnosis not present

## 2023-12-03 DIAGNOSIS — I110 Hypertensive heart disease with heart failure: Secondary | ICD-10-CM | POA: Diagnosis not present

## 2023-12-03 DIAGNOSIS — K219 Gastro-esophageal reflux disease without esophagitis: Secondary | ICD-10-CM | POA: Diagnosis not present

## 2023-12-03 DIAGNOSIS — Z79899 Other long term (current) drug therapy: Secondary | ICD-10-CM | POA: Diagnosis not present

## 2023-12-03 DIAGNOSIS — M109 Gout, unspecified: Secondary | ICD-10-CM | POA: Diagnosis not present

## 2023-12-03 DIAGNOSIS — E039 Hypothyroidism, unspecified: Secondary | ICD-10-CM | POA: Diagnosis not present

## 2023-12-03 DIAGNOSIS — I5032 Chronic diastolic (congestive) heart failure: Secondary | ICD-10-CM | POA: Diagnosis not present

## 2023-12-03 DIAGNOSIS — Z7951 Long term (current) use of inhaled steroids: Secondary | ICD-10-CM | POA: Diagnosis not present

## 2023-12-03 DIAGNOSIS — R21 Rash and other nonspecific skin eruption: Secondary | ICD-10-CM | POA: Diagnosis not present

## 2023-12-03 DIAGNOSIS — M797 Fibromyalgia: Secondary | ICD-10-CM | POA: Diagnosis not present

## 2023-12-03 DIAGNOSIS — Z7901 Long term (current) use of anticoagulants: Secondary | ICD-10-CM | POA: Diagnosis not present

## 2023-12-03 DIAGNOSIS — J4489 Other specified chronic obstructive pulmonary disease: Secondary | ICD-10-CM | POA: Diagnosis not present

## 2023-12-03 DIAGNOSIS — Z86718 Personal history of other venous thrombosis and embolism: Secondary | ICD-10-CM | POA: Diagnosis not present

## 2023-12-12 DIAGNOSIS — R600 Localized edema: Secondary | ICD-10-CM | POA: Diagnosis not present

## 2023-12-12 DIAGNOSIS — R29898 Other symptoms and signs involving the musculoskeletal system: Secondary | ICD-10-CM | POA: Diagnosis not present

## 2023-12-13 DIAGNOSIS — Z7901 Long term (current) use of anticoagulants: Secondary | ICD-10-CM | POA: Diagnosis not present

## 2023-12-17 DIAGNOSIS — M1712 Unilateral primary osteoarthritis, left knee: Secondary | ICD-10-CM | POA: Diagnosis not present

## 2023-12-17 DIAGNOSIS — S0093XA Contusion of unspecified part of head, initial encounter: Secondary | ICD-10-CM | POA: Diagnosis not present

## 2023-12-17 DIAGNOSIS — M129 Arthropathy, unspecified: Secondary | ICD-10-CM | POA: Diagnosis not present

## 2023-12-17 DIAGNOSIS — G8929 Other chronic pain: Secondary | ICD-10-CM | POA: Diagnosis not present

## 2023-12-17 DIAGNOSIS — J449 Chronic obstructive pulmonary disease, unspecified: Secondary | ICD-10-CM | POA: Diagnosis not present

## 2023-12-17 DIAGNOSIS — M797 Fibromyalgia: Secondary | ICD-10-CM | POA: Diagnosis not present

## 2023-12-17 DIAGNOSIS — E785 Hyperlipidemia, unspecified: Secondary | ICD-10-CM | POA: Diagnosis not present

## 2023-12-17 DIAGNOSIS — S098XXA Other specified injuries of head, initial encounter: Secondary | ICD-10-CM | POA: Diagnosis not present

## 2023-12-17 DIAGNOSIS — M5134 Other intervertebral disc degeneration, thoracic region: Secondary | ICD-10-CM | POA: Diagnosis not present

## 2023-12-17 DIAGNOSIS — M25511 Pain in right shoulder: Secondary | ICD-10-CM | POA: Diagnosis not present

## 2023-12-17 DIAGNOSIS — Z888 Allergy status to other drugs, medicaments and biological substances status: Secondary | ICD-10-CM | POA: Diagnosis not present

## 2023-12-17 DIAGNOSIS — S0990XA Unspecified injury of head, initial encounter: Secondary | ICD-10-CM | POA: Diagnosis not present

## 2023-12-17 DIAGNOSIS — R55 Syncope and collapse: Secondary | ICD-10-CM | POA: Diagnosis not present

## 2023-12-17 DIAGNOSIS — W010XXA Fall on same level from slipping, tripping and stumbling without subsequent striking against object, initial encounter: Secondary | ICD-10-CM | POA: Diagnosis not present

## 2023-12-17 DIAGNOSIS — M19011 Primary osteoarthritis, right shoulder: Secondary | ICD-10-CM | POA: Diagnosis not present

## 2023-12-17 DIAGNOSIS — M25519 Pain in unspecified shoulder: Secondary | ICD-10-CM | POA: Diagnosis not present

## 2023-12-17 DIAGNOSIS — M542 Cervicalgia: Secondary | ICD-10-CM | POA: Diagnosis not present

## 2023-12-17 DIAGNOSIS — M25562 Pain in left knee: Secondary | ICD-10-CM | POA: Diagnosis not present

## 2023-12-17 DIAGNOSIS — Z91040 Latex allergy status: Secondary | ICD-10-CM | POA: Diagnosis not present

## 2023-12-17 DIAGNOSIS — M545 Low back pain, unspecified: Secondary | ICD-10-CM | POA: Diagnosis not present

## 2023-12-17 DIAGNOSIS — I509 Heart failure, unspecified: Secondary | ICD-10-CM | POA: Diagnosis not present

## 2023-12-17 DIAGNOSIS — M47816 Spondylosis without myelopathy or radiculopathy, lumbar region: Secondary | ICD-10-CM | POA: Diagnosis not present

## 2023-12-17 DIAGNOSIS — M5135 Other intervertebral disc degeneration, thoracolumbar region: Secondary | ICD-10-CM | POA: Diagnosis not present

## 2023-12-17 DIAGNOSIS — W1839XA Other fall on same level, initial encounter: Secondary | ICD-10-CM | POA: Diagnosis not present

## 2023-12-17 DIAGNOSIS — Z79899 Other long term (current) drug therapy: Secondary | ICD-10-CM | POA: Diagnosis not present

## 2023-12-17 DIAGNOSIS — Z885 Allergy status to narcotic agent status: Secondary | ICD-10-CM | POA: Diagnosis not present

## 2023-12-17 DIAGNOSIS — R2689 Other abnormalities of gait and mobility: Secondary | ICD-10-CM | POA: Diagnosis not present

## 2023-12-17 DIAGNOSIS — S3982XA Other specified injuries of lower back, initial encounter: Secondary | ICD-10-CM | POA: Diagnosis not present

## 2023-12-17 DIAGNOSIS — N3000 Acute cystitis without hematuria: Secondary | ICD-10-CM | POA: Diagnosis not present

## 2023-12-17 DIAGNOSIS — I110 Hypertensive heart disease with heart failure: Secondary | ICD-10-CM | POA: Diagnosis not present

## 2023-12-17 DIAGNOSIS — G629 Polyneuropathy, unspecified: Secondary | ICD-10-CM | POA: Diagnosis not present

## 2023-12-17 DIAGNOSIS — E039 Hypothyroidism, unspecified: Secondary | ICD-10-CM | POA: Diagnosis not present

## 2023-12-17 DIAGNOSIS — Z7901 Long term (current) use of anticoagulants: Secondary | ICD-10-CM | POA: Diagnosis not present

## 2023-12-17 NOTE — Progress Notes (Deleted)
 Office Visit Note  Patient: Dorothy Ford             Date of Birth: [birth year]           MRN: [id number]             PCP: Veda Gerald, MD Referring: Veda Gerald, MD Visit Date: 12/31/2023 Occupation: @GUAROCC @  Subjective:    History of Present Illness: ARIANDA Ford is a 73 y.o. female with history of pseudogout and osteoarthritis. Patient remains on colchicine  0.6 mg 1 tablet by mouth daily.   Recent fall requiring ED evaluation   [facility] September/October 2024  CBC and CMP updated on 12/17/23.    Activities of Daily Living:  Patient reports morning stiffness for *** {minute/hour:19697}.   Patient {ACTIONS;DENIES/REPORTS:21021675::"Denies"} nocturnal pain.  Difficulty dressing/grooming: {ACTIONS;DENIES/REPORTS:21021675::"Denies"} Difficulty climbing stairs: {ACTIONS;DENIES/REPORTS:21021675::"Denies"} Difficulty getting out of chair: {ACTIONS;DENIES/REPORTS:21021675::"Denies"} Difficulty using hands for taps, buttons, cutlery, and/or writing: {ACTIONS;DENIES/REPORTS:21021675::"Denies"}  No Rheumatology ROS completed.   PMFS History:  Patient Active Problem List   Diagnosis Date Noted   IBS (irritable bowel syndrome) 10/17/2023   Nausea without vomiting 10/17/2023   Hemorrhoids 10/17/2023   Preop cardiovascular exam 06/20/2023   PSVT (paroxysmal supraventricular tachycardia) (HCC) 06/20/2023   Unilateral primary osteoarthritis, right hip 06/10/2023   Gastroesophageal reflux disease 02/28/2023   Rectal irritation 02/28/2023   Chronic venous insufficiency 12/14/2022   Fecal urgency 03/19/2022   Bloating 03/19/2022   Grade I hemorrhoids 12/19/2021   Ankle joint pain 12/07/2019   Low back pain 12/07/2019   Lumbar radiculopathy 12/07/2019   Pain of left upper arm 12/07/2019   Acute foot pain, left 04/15/2019   Bursitis/tendonitis, shoulder 04/23/2018   Inflammatory arthropathy 02/20/2018   Shoulder pain 02/20/2018   Pain in both knees 11/20/2017    Chondromalacia patellae, left knee 01/15/2017   Chondromalacia patellae, right knee 01/15/2017   Osteoarthritis of both hands 07/24/2016   DDD (degenerative disc disease), cervical 07/24/2016   DDD (degenerative disc disease), lumbar 07/24/2016   Osteoporosis 07/24/2016   Glaucoma 07/24/2016   Sleep apnea 07/24/2016   Pseudogout 07/19/2016   Primary osteoarthritis of both knees 07/19/2016   Fibromyalgia 07/19/2016   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   Pulmonary embolism (HCC) 01/18/2012   Anxiety 01/18/2012   Syncope    RHEUMATOID LUNG 12/30/2009   OBESITY, UNSPECIFIED 06/02/2009   DEPRESSIVE DISORDER NOT ELSEWHERE CLASSIFIED 06/02/2009   SUPRAVENTRICULAR TACHYCARDIA 06/02/2009   Chronic diastolic heart failure (HCC) 06/02/2009   COPD 06/02/2009   DIAPHRAGMAT HERN W/O MENTION OBSTRUCTION/GANGREN 06/02/2009   Palpitations 06/02/2009   CHEST PAIN UNSPECIFIED 06/02/2009    Past Medical History:  Diagnosis Date   Acid reflux    Asthma    Cardiac dysrhythmia, unspecified    Carotid bruit    Bilateral   Cataract    Chest pain, unspecified    Chronic airway obstruction, not elsewhere classified    Chronic kidney disease    mild per patient   Chronic sinusitis    Congestive heart failure, unspecified    DDD (degenerative disc disease), cervical 07/24/2016   DDD (degenerative disc disease), lumbar 07/24/2016   Depressive disorder, not elsewhere classified    Diaphragmatic hernia without mention of obstruction or gangrene    Edema    left leg   Fibromyalgia    Hemorrhoid    High cholesterol    History of rheumatoid arthritis    Hypothyroidism    Neuropathy    Obesity, unspecified    Obstructive lung disease (generalized) (  HCC)    Osteoarthritis of both hands 07/24/2016   Osteopenia    Osteoporosis 07/24/2016   Palpitations    Pseudogout involving multiple joints    Sleep apnea    Sleep apnea    per patient   SVT (supraventricular tachycardia) (HCC)    Syncope     admitted 09/2011   Thyroid  disease    Unspecified essential hypertension     Family History  Problem Relation Age of Onset   Heart disease Mother    Hypertension Mother    Stroke Other    Heart disease Father    Hypertension Father    Kidney failure Sister    Asthma Sister    Past Surgical History:  Procedure Laterality Date   CARPAL TUNNEL RELEASE Bilateral    CATARACT EXTRACTION, BILATERAL     11/02/2021, 11/23/2021   CATHETER ABLATION  02/01/1997   @ [facility]   COLONOSCOPY WITH PROPOFOL  N/A 08/18/2021   multiple adenomas. non-bleeding internal hemorrhoids.   INCONTINENCE SURGERY     POLYPECTOMY  08/18/2021   Procedure: POLYPECTOMY;  Surgeon: Urban Garden, MD;  Location: [facility];  Service: Gastroenterology;;   removal toenail Bilateral    great toes   VEIN SURGERY BLADDER     Social History   Social History Narrative   Not on file   Immunization History  Administered Date(s) Administered   Moderna Sars-Covid-2 Vaccination 10/08/2019, 11/06/2019     Objective: Vital Signs: There were no vitals taken for this visit.   Physical Exam Vitals and nursing note reviewed.  Constitutional:      Appearance: She is well-developed.  HENT:     Head: Normocephalic and atraumatic.  Eyes:     Conjunctiva/sclera: Conjunctivae normal.  Cardiovascular:     Rate and Rhythm: Normal rate and regular rhythm.     Heart sounds: Normal heart sounds.  Pulmonary:     Effort: Pulmonary effort is normal.     Breath sounds: Normal breath sounds.  Abdominal:     General: Bowel sounds are normal.     Palpations: Abdomen is soft.  Musculoskeletal:     Cervical back: Normal range of motion.  Lymphadenopathy:     Cervical: No cervical adenopathy.  Skin:    General: Skin is warm and dry.     Capillary Refill: Capillary refill takes less than 2 seconds.  Neurological:     Mental Status: She is alert and oriented to person, place, and time.  Psychiatric:        Behavior:  Behavior normal.      Musculoskeletal Exam: ***  CDAI Exam: CDAI Score: -- Patient Global: --; Provider Global: -- Swollen: --; Tender: -- Joint Exam 12/31/2023   No joint exam has been documented for this visit   There is currently no information documented on the homunculus. Go to the Rheumatology activity and complete the homunculus joint exam.  Investigation: No additional findings.  Imaging: No results found.  Recent Labs: Lab Results  Component Value Date   WBC 5.4 01/10/2023   HGB 11.8 01/10/2023   PLT 304 01/10/2023   NA 134 (L) 06/25/2023   K 3.7 06/25/2023   CL 99 06/25/2023   CO2 28 06/25/2023   GLUCOSE 102 (H) 06/25/2023   BUN 12 06/25/2023   CREATININE 0.76 06/25/2023   BILITOT 1.2 06/25/2023   ALKPHOS 119 06/25/2023   AST 27 06/25/2023   ALT 21 06/25/2023   PROT 7.5 06/25/2023   ALBUMIN 3.9 06/25/2023  CALCIUM 8.4 (L) 06/25/2023   GFRAA >60 08/12/2018    Speciality Comments: No specialty comments available.  Procedures:  No procedures performed Allergies: Nitroglycerin, Other, Adhesive [tape], Codeine, and Latex   Assessment / Plan:     Visit Diagnoses: Pseudogout  Medication monitoring encounter  Primary osteoarthritis of both hands  Chronic left shoulder pain  Primary osteoarthritis of right hip  Chronic pain of left knee  Primary osteoarthritis of both knees  DDD (degenerative disc disease), cervical  Spondylosis of lumbar spine  Fibromyalgia  Osteopenia of multiple sites  History of CHF (congestive heart failure)  History of pulmonary embolism  History of COPD  Chronic anticoagulation  History of glaucoma  History of sleep apnea  Orders: No orders of the defined types were placed in this encounter.  No orders of the defined types were placed in this encounter.   Face-to-face time spent with patient was *** minutes. Greater than 50% of time was spent in counseling and coordination of care.  Follow-Up  Instructions: No follow-ups on file.   Romayne Clubs, PA-C  Note - This record has been created using Dragon software.  Chart creation errors have been sought, but may not always  have been located. Such creation errors do not reflect on  the standard of medical care.

## 2023-12-23 ENCOUNTER — Other Ambulatory Visit (INDEPENDENT_AMBULATORY_CARE_PROVIDER_SITE_OTHER): Payer: Self-pay | Admitting: Gastroenterology

## 2023-12-29 DIAGNOSIS — Z9181 History of falling: Secondary | ICD-10-CM | POA: Diagnosis not present

## 2023-12-29 DIAGNOSIS — E785 Hyperlipidemia, unspecified: Secondary | ICD-10-CM | POA: Diagnosis not present

## 2023-12-29 DIAGNOSIS — Z86718 Personal history of other venous thrombosis and embolism: Secondary | ICD-10-CM | POA: Diagnosis not present

## 2023-12-29 DIAGNOSIS — Z79899 Other long term (current) drug therapy: Secondary | ICD-10-CM | POA: Diagnosis not present

## 2023-12-29 DIAGNOSIS — Z7951 Long term (current) use of inhaled steroids: Secondary | ICD-10-CM | POA: Diagnosis not present

## 2023-12-29 DIAGNOSIS — K219 Gastro-esophageal reflux disease without esophagitis: Secondary | ICD-10-CM | POA: Diagnosis not present

## 2023-12-29 DIAGNOSIS — Z8616 Personal history of COVID-19: Secondary | ICD-10-CM | POA: Diagnosis not present

## 2023-12-29 DIAGNOSIS — H269 Unspecified cataract: Secondary | ICD-10-CM | POA: Diagnosis not present

## 2023-12-29 DIAGNOSIS — I110 Hypertensive heart disease with heart failure: Secondary | ICD-10-CM | POA: Diagnosis not present

## 2023-12-29 DIAGNOSIS — E039 Hypothyroidism, unspecified: Secondary | ICD-10-CM | POA: Diagnosis not present

## 2023-12-29 DIAGNOSIS — Z7901 Long term (current) use of anticoagulants: Secondary | ICD-10-CM | POA: Diagnosis not present

## 2023-12-29 DIAGNOSIS — J4489 Other specified chronic obstructive pulmonary disease: Secondary | ICD-10-CM | POA: Diagnosis not present

## 2023-12-29 DIAGNOSIS — M109 Gout, unspecified: Secondary | ICD-10-CM | POA: Diagnosis not present

## 2023-12-29 DIAGNOSIS — Z87440 Personal history of urinary (tract) infections: Secondary | ICD-10-CM | POA: Diagnosis not present

## 2023-12-29 DIAGNOSIS — I5032 Chronic diastolic (congestive) heart failure: Secondary | ICD-10-CM | POA: Diagnosis not present

## 2023-12-29 DIAGNOSIS — M797 Fibromyalgia: Secondary | ICD-10-CM | POA: Diagnosis not present

## 2023-12-31 ENCOUNTER — Ambulatory Visit: Payer: 59 | Admitting: Physician Assistant

## 2023-12-31 DIAGNOSIS — Z8669 Personal history of other diseases of the nervous system and sense organs: Secondary | ICD-10-CM

## 2023-12-31 DIAGNOSIS — M19041 Primary osteoarthritis, right hand: Secondary | ICD-10-CM

## 2023-12-31 DIAGNOSIS — M5030 Other cervical disc degeneration, unspecified cervical region: Secondary | ICD-10-CM

## 2023-12-31 DIAGNOSIS — M47816 Spondylosis without myelopathy or radiculopathy, lumbar region: Secondary | ICD-10-CM

## 2023-12-31 DIAGNOSIS — I10 Essential (primary) hypertension: Secondary | ICD-10-CM | POA: Diagnosis not present

## 2023-12-31 DIAGNOSIS — M109 Gout, unspecified: Secondary | ICD-10-CM | POA: Diagnosis not present

## 2023-12-31 DIAGNOSIS — K219 Gastro-esophageal reflux disease without esophagitis: Secondary | ICD-10-CM | POA: Diagnosis not present

## 2023-12-31 DIAGNOSIS — H269 Unspecified cataract: Secondary | ICD-10-CM | POA: Diagnosis not present

## 2023-12-31 DIAGNOSIS — M8589 Other specified disorders of bone density and structure, multiple sites: Secondary | ICD-10-CM

## 2023-12-31 DIAGNOSIS — R296 Repeated falls: Secondary | ICD-10-CM | POA: Diagnosis not present

## 2023-12-31 DIAGNOSIS — Z5181 Encounter for therapeutic drug level monitoring: Secondary | ICD-10-CM

## 2023-12-31 DIAGNOSIS — M797 Fibromyalgia: Secondary | ICD-10-CM | POA: Diagnosis not present

## 2023-12-31 DIAGNOSIS — Z87440 Personal history of urinary (tract) infections: Secondary | ICD-10-CM | POA: Diagnosis not present

## 2023-12-31 DIAGNOSIS — I5032 Chronic diastolic (congestive) heart failure: Secondary | ICD-10-CM | POA: Diagnosis not present

## 2023-12-31 DIAGNOSIS — Z86711 Personal history of pulmonary embolism: Secondary | ICD-10-CM

## 2023-12-31 DIAGNOSIS — E785 Hyperlipidemia, unspecified: Secondary | ICD-10-CM | POA: Diagnosis not present

## 2023-12-31 DIAGNOSIS — M1120 Other chondrocalcinosis, unspecified site: Secondary | ICD-10-CM

## 2023-12-31 DIAGNOSIS — Z7901 Long term (current) use of anticoagulants: Secondary | ICD-10-CM

## 2023-12-31 DIAGNOSIS — E039 Hypothyroidism, unspecified: Secondary | ICD-10-CM | POA: Diagnosis not present

## 2023-12-31 DIAGNOSIS — I110 Hypertensive heart disease with heart failure: Secondary | ICD-10-CM | POA: Diagnosis not present

## 2023-12-31 DIAGNOSIS — Z8679 Personal history of other diseases of the circulatory system: Secondary | ICD-10-CM

## 2023-12-31 DIAGNOSIS — Z79899 Other long term (current) drug therapy: Secondary | ICD-10-CM | POA: Diagnosis not present

## 2023-12-31 DIAGNOSIS — J4489 Other specified chronic obstructive pulmonary disease: Secondary | ICD-10-CM | POA: Diagnosis not present

## 2023-12-31 DIAGNOSIS — Z9181 History of falling: Secondary | ICD-10-CM | POA: Diagnosis not present

## 2023-12-31 DIAGNOSIS — Z86718 Personal history of other venous thrombosis and embolism: Secondary | ICD-10-CM | POA: Diagnosis not present

## 2023-12-31 DIAGNOSIS — Z8616 Personal history of COVID-19: Secondary | ICD-10-CM | POA: Diagnosis not present

## 2023-12-31 DIAGNOSIS — M1611 Unilateral primary osteoarthritis, right hip: Secondary | ICD-10-CM

## 2023-12-31 DIAGNOSIS — N3090 Cystitis, unspecified without hematuria: Secondary | ICD-10-CM | POA: Diagnosis not present

## 2023-12-31 DIAGNOSIS — M170 Bilateral primary osteoarthritis of knee: Secondary | ICD-10-CM

## 2023-12-31 DIAGNOSIS — Z8709 Personal history of other diseases of the respiratory system: Secondary | ICD-10-CM

## 2023-12-31 DIAGNOSIS — G8929 Other chronic pain: Secondary | ICD-10-CM

## 2023-12-31 DIAGNOSIS — Z7951 Long term (current) use of inhaled steroids: Secondary | ICD-10-CM | POA: Diagnosis not present

## 2024-01-01 DIAGNOSIS — G629 Polyneuropathy, unspecified: Secondary | ICD-10-CM | POA: Diagnosis not present

## 2024-01-01 DIAGNOSIS — G8929 Other chronic pain: Secondary | ICD-10-CM | POA: Diagnosis not present

## 2024-01-01 DIAGNOSIS — M545 Low back pain, unspecified: Secondary | ICD-10-CM | POA: Diagnosis not present

## 2024-01-01 DIAGNOSIS — R55 Syncope and collapse: Secondary | ICD-10-CM | POA: Diagnosis not present

## 2024-01-06 DIAGNOSIS — I5032 Chronic diastolic (congestive) heart failure: Secondary | ICD-10-CM | POA: Diagnosis not present

## 2024-01-06 DIAGNOSIS — Z8616 Personal history of COVID-19: Secondary | ICD-10-CM | POA: Diagnosis not present

## 2024-01-06 DIAGNOSIS — Z9181 History of falling: Secondary | ICD-10-CM | POA: Diagnosis not present

## 2024-01-06 DIAGNOSIS — M797 Fibromyalgia: Secondary | ICD-10-CM | POA: Diagnosis not present

## 2024-01-06 DIAGNOSIS — E785 Hyperlipidemia, unspecified: Secondary | ICD-10-CM | POA: Diagnosis not present

## 2024-01-06 DIAGNOSIS — Z86718 Personal history of other venous thrombosis and embolism: Secondary | ICD-10-CM | POA: Diagnosis not present

## 2024-01-06 DIAGNOSIS — M109 Gout, unspecified: Secondary | ICD-10-CM | POA: Diagnosis not present

## 2024-01-06 DIAGNOSIS — Z7951 Long term (current) use of inhaled steroids: Secondary | ICD-10-CM | POA: Diagnosis not present

## 2024-01-06 DIAGNOSIS — I110 Hypertensive heart disease with heart failure: Secondary | ICD-10-CM | POA: Diagnosis not present

## 2024-01-06 DIAGNOSIS — H269 Unspecified cataract: Secondary | ICD-10-CM | POA: Diagnosis not present

## 2024-01-06 DIAGNOSIS — Z87440 Personal history of urinary (tract) infections: Secondary | ICD-10-CM | POA: Diagnosis not present

## 2024-01-06 DIAGNOSIS — J4489 Other specified chronic obstructive pulmonary disease: Secondary | ICD-10-CM | POA: Diagnosis not present

## 2024-01-06 DIAGNOSIS — E039 Hypothyroidism, unspecified: Secondary | ICD-10-CM | POA: Diagnosis not present

## 2024-01-06 DIAGNOSIS — K219 Gastro-esophageal reflux disease without esophagitis: Secondary | ICD-10-CM | POA: Diagnosis not present

## 2024-01-06 DIAGNOSIS — Z7901 Long term (current) use of anticoagulants: Secondary | ICD-10-CM | POA: Diagnosis not present

## 2024-01-06 DIAGNOSIS — Z79899 Other long term (current) drug therapy: Secondary | ICD-10-CM | POA: Diagnosis not present

## 2024-01-07 ENCOUNTER — Ambulatory Visit: Payer: 59 | Admitting: Physician Assistant

## 2024-01-08 DIAGNOSIS — M109 Gout, unspecified: Secondary | ICD-10-CM | POA: Diagnosis not present

## 2024-01-08 DIAGNOSIS — K219 Gastro-esophageal reflux disease without esophagitis: Secondary | ICD-10-CM | POA: Diagnosis not present

## 2024-01-08 DIAGNOSIS — I110 Hypertensive heart disease with heart failure: Secondary | ICD-10-CM | POA: Diagnosis not present

## 2024-01-08 DIAGNOSIS — E039 Hypothyroidism, unspecified: Secondary | ICD-10-CM | POA: Diagnosis not present

## 2024-01-08 DIAGNOSIS — Z87440 Personal history of urinary (tract) infections: Secondary | ICD-10-CM | POA: Diagnosis not present

## 2024-01-08 DIAGNOSIS — Z9181 History of falling: Secondary | ICD-10-CM | POA: Diagnosis not present

## 2024-01-08 DIAGNOSIS — M797 Fibromyalgia: Secondary | ICD-10-CM | POA: Diagnosis not present

## 2024-01-08 DIAGNOSIS — Z7951 Long term (current) use of inhaled steroids: Secondary | ICD-10-CM | POA: Diagnosis not present

## 2024-01-08 DIAGNOSIS — Z7901 Long term (current) use of anticoagulants: Secondary | ICD-10-CM | POA: Diagnosis not present

## 2024-01-08 DIAGNOSIS — Z86718 Personal history of other venous thrombosis and embolism: Secondary | ICD-10-CM | POA: Diagnosis not present

## 2024-01-08 DIAGNOSIS — J4489 Other specified chronic obstructive pulmonary disease: Secondary | ICD-10-CM | POA: Diagnosis not present

## 2024-01-08 DIAGNOSIS — E785 Hyperlipidemia, unspecified: Secondary | ICD-10-CM | POA: Diagnosis not present

## 2024-01-08 DIAGNOSIS — I5032 Chronic diastolic (congestive) heart failure: Secondary | ICD-10-CM | POA: Diagnosis not present

## 2024-01-08 DIAGNOSIS — Z79899 Other long term (current) drug therapy: Secondary | ICD-10-CM | POA: Diagnosis not present

## 2024-01-08 DIAGNOSIS — Z8616 Personal history of COVID-19: Secondary | ICD-10-CM | POA: Diagnosis not present

## 2024-01-08 DIAGNOSIS — H269 Unspecified cataract: Secondary | ICD-10-CM | POA: Diagnosis not present

## 2024-01-10 DIAGNOSIS — Z9181 History of falling: Secondary | ICD-10-CM | POA: Diagnosis not present

## 2024-01-10 DIAGNOSIS — Z7901 Long term (current) use of anticoagulants: Secondary | ICD-10-CM | POA: Diagnosis not present

## 2024-01-10 DIAGNOSIS — Z8616 Personal history of COVID-19: Secondary | ICD-10-CM | POA: Diagnosis not present

## 2024-01-10 DIAGNOSIS — I5032 Chronic diastolic (congestive) heart failure: Secondary | ICD-10-CM | POA: Diagnosis not present

## 2024-01-10 DIAGNOSIS — Z86718 Personal history of other venous thrombosis and embolism: Secondary | ICD-10-CM | POA: Diagnosis not present

## 2024-01-10 DIAGNOSIS — M797 Fibromyalgia: Secondary | ICD-10-CM | POA: Diagnosis not present

## 2024-01-10 DIAGNOSIS — H269 Unspecified cataract: Secondary | ICD-10-CM | POA: Diagnosis not present

## 2024-01-10 DIAGNOSIS — I110 Hypertensive heart disease with heart failure: Secondary | ICD-10-CM | POA: Diagnosis not present

## 2024-01-10 DIAGNOSIS — K219 Gastro-esophageal reflux disease without esophagitis: Secondary | ICD-10-CM | POA: Diagnosis not present

## 2024-01-10 DIAGNOSIS — M109 Gout, unspecified: Secondary | ICD-10-CM | POA: Diagnosis not present

## 2024-01-10 DIAGNOSIS — Z87440 Personal history of urinary (tract) infections: Secondary | ICD-10-CM | POA: Diagnosis not present

## 2024-01-10 DIAGNOSIS — J4489 Other specified chronic obstructive pulmonary disease: Secondary | ICD-10-CM | POA: Diagnosis not present

## 2024-01-10 DIAGNOSIS — Z7951 Long term (current) use of inhaled steroids: Secondary | ICD-10-CM | POA: Diagnosis not present

## 2024-01-10 DIAGNOSIS — E039 Hypothyroidism, unspecified: Secondary | ICD-10-CM | POA: Diagnosis not present

## 2024-01-10 DIAGNOSIS — Z79899 Other long term (current) drug therapy: Secondary | ICD-10-CM | POA: Diagnosis not present

## 2024-01-10 DIAGNOSIS — E785 Hyperlipidemia, unspecified: Secondary | ICD-10-CM | POA: Diagnosis not present

## 2024-01-20 DIAGNOSIS — I10 Essential (primary) hypertension: Secondary | ICD-10-CM | POA: Diagnosis not present

## 2024-01-20 DIAGNOSIS — E039 Hypothyroidism, unspecified: Secondary | ICD-10-CM | POA: Diagnosis not present

## 2024-01-20 DIAGNOSIS — M1710 Unilateral primary osteoarthritis, unspecified knee: Secondary | ICD-10-CM | POA: Diagnosis not present

## 2024-01-20 DIAGNOSIS — E7849 Other hyperlipidemia: Secondary | ICD-10-CM | POA: Diagnosis not present

## 2024-01-20 DIAGNOSIS — I5032 Chronic diastolic (congestive) heart failure: Secondary | ICD-10-CM | POA: Diagnosis not present

## 2024-01-20 DIAGNOSIS — M10051 Idiopathic gout, right hip: Secondary | ICD-10-CM | POA: Diagnosis not present

## 2024-01-20 DIAGNOSIS — Z0000 Encounter for general adult medical examination without abnormal findings: Secondary | ICD-10-CM | POA: Diagnosis not present

## 2024-01-20 DIAGNOSIS — G4739 Other sleep apnea: Secondary | ICD-10-CM | POA: Diagnosis not present

## 2024-02-05 ENCOUNTER — Other Ambulatory Visit (INDEPENDENT_AMBULATORY_CARE_PROVIDER_SITE_OTHER): Payer: Self-pay

## 2024-02-05 MED ORDER — HYDROCORTISONE (PERIANAL) 2.5 % EX CREA: 1.0000 | TOPICAL_CREAM | Freq: Two times a day (BID) | CUTANEOUS | 1 refills | Status: AC

## 2024-02-21 DIAGNOSIS — Z7901 Long term (current) use of anticoagulants: Secondary | ICD-10-CM | POA: Diagnosis not present

## 2024-02-26 NOTE — Progress Notes (Unsigned)
 Cardiology Office Note    Date:  02/27/2024  ID:  Dorothy Ford, DOB 05-23-1951, MRN [id number] Cardiologist: Vishnu P Mallipeddi, MD    History of Present Illness:    Dorothy Ford is a 73 y.o. female with past medical history of chronic HFpEF, COPD, history of PE, SVT (prior ablation in 1998), HTN and fibromyalgia who presents to the office today for 71-month follow-up.  She was last examined by Dr. Mallipeddi in 06/2023 and was overall doing well at that time and denied any recent anginal symptoms. She was planning to undergo hip and knee replacement and did need cardiac clearance for this, therefore a Lexiscan  Myoview  was arranged. This showed no evidence of ischemia or infarction and was a low-risk study. She was started on Jardiance  10 mg daily given her HFpEF and was continued on Lasix  40 mg daily, Amlodipine 5 mg daily, Atorvastatin 40 mg daily, Lopressor  25 mg daily and Coumadin.  In talking with the patient today, she reports a variety of issues over the past few months. She was hospitalized with COVID in 10/2023 and had an AKI with possible UTI at that time as well. Given her significant weakness, she was discharged to SNF but has since returned home. She reports having intermittent dyspnea in the setting of her prior COVID infection, allergies and postnasal drip. No specific orthopnea or PND. She has experienced intermittent lower extremity edema since her hospitalization but reports good compliance with Lasix . Says that she was informed the blood flow in her legs was abnormal by her PCP and she does have follow-up with Vascular in 05/2024 for further discussion of this (we will request a copy of records but by her description sounds most consistent with ABI's). She denies any specific exertional chest pain or persistent palpitations resembling her prior SVT. Reports occasional, brief episodes of palpitations but feels like these are due to anxiety at that time.  Studies Reviewed:    EKG: EKG is not ordered today.  Echocardiogram: 01/2023 IMPRESSIONS     1. Left ventricular ejection fraction, by estimation, is 55 to 60%. The  left ventricle has normal function. The left ventricle has no regional  wall motion abnormalities. Left ventricular diastolic parameters are  consistent with Grade I diastolic  dysfunction (impaired relaxation).   2. Right ventricular systolic function is normal. The right ventricular  size is normal. There is normal pulmonary artery systolic pressure.   3. The mitral valve is normal in structure. No evidence of mitral valve  regurgitation. No evidence of mitral stenosis.   4. The aortic valve is tricuspid. Aortic valve regurgitation is not  visualized. No aortic stenosis is present.   5. The inferior vena cava is normal in size with greater than 50%  respiratory variability, suggesting right atrial pressure of 3 mmHg.   Comparison(s): No prior Echocardiogram.   NST: 06/2023   Stress ECG is negative for ischemia and arrhythmias.   LV perfusion is normal. There is no evidence of ischemia. There is no evidence of infarction.   Left ventricular function is normal. Nuclear stress EF: 71%.   Findings are consistent with no ischemia and no infarction. The study is low risk.  Echocardiogram: 10/2023 Summary   1. The left ventricle is normal in size with normal wall thickness.    2. The left ventricular systolic function is normal, LVEF is visually  estimated at > 55%.    3. The right ventricle is normal in size, with normal systolic function.  4. IVC size and inspiratory change suggest mildly elevated right atrial  pressure. (5-10 mmHg).   Physical Exam:   VS:  BP 122/70   Pulse 63   Ht 4' 11 (1.499 m)   Wt 158 lb 6.4 oz (71.8 kg)   SpO2 96%   BMI 31.99 kg/m    Wt Readings from Last 3 Encounters:  02/27/24 158 lb 6.4 oz (71.8 kg)  10/17/23 171 lb 1.6 oz (77.6 kg)  07/10/23 176 lb (79.8 kg)     GEN: Well nourished, well  developed female appearing in no acute distress NECK: No JVD; No carotid bruits CARDIAC: RRR, no murmurs, rubs, gallops RESPIRATORY:  Clear to auscultation without rales, wheezing or rhonchi  ABDOMEN: Appears non-distended. No obvious abdominal masses. EXTREMITIES: No clubbing or cyanosis.  Trace lower extremity edema. No pitting. Distal pedal pulses are 2+ bilaterally.   Assessment and Plan:   1. Chronic heart failure with preserved ejection fraction (HFpEF) (HCC) - Echocardiogram in 01/2023 showed a preserved EF of 55 to 60% with normal RV function and grade 1 diastolic dysfunction.  She did have a repeat echocardiogram at [facility] in 10/2023 as outlined above which showed her EF remained preserved with normal RV function as well. - She only has trace edema on examination today with no pitting.  Will continue current medical therapy with Jardiance  10 mg daily and Lasix  40 mg daily. Creatinine was stable at 1.14 when checked in 12/2023.  We reviewed the importance of following daily weights and taking an extra Lasix  tablet if needed for weight gain greater than 2 pounds overnight or 5 pounds in 1 week. Was encouraged to continue to limit sodium intake and utilize compression stockings.   2. PSVT (paroxysmal supraventricular tachycardia) (HCC) - She has a history of SVT with prior ablation in 1998 and most recent monitor in 02/2023 showed predominantly normal sinus rhythm with 6 runs of SVT with the longest lasting for 37 seconds. She was previously on Toprol -XL but is currently taking Lopressor  25 mg once daily. Given that she only takes this once daily, will switch to Toprol -XL 25 mg daily for sustained release.  3. Essential (primary) hypertension - BP is well-controlled at 122/70 during today's visit. Continue current medical therapy with Olmesartan 40 mg daily, Lasix  40 mg daily and will switch Lopressor  to Toprol -XL as discussed above.  4. History of pulmonary embolus (PE) - She has  remained on long-term anticoagulation with Coumadin and INR is managed by her PCP.  No reports of active bleeding.   Signed, Laymon CHRISTELLA Qua, PA-C

## 2024-02-27 ENCOUNTER — Encounter: Payer: Self-pay | Admitting: Student

## 2024-02-27 ENCOUNTER — Ambulatory Visit: Attending: Student | Admitting: Student

## 2024-02-27 VITALS — BP 122/70 | HR 63 | Ht 59.0 in | Wt 158.4 lb

## 2024-02-27 DIAGNOSIS — I10 Essential (primary) hypertension: Secondary | ICD-10-CM | POA: Diagnosis not present

## 2024-02-27 DIAGNOSIS — I471 Supraventricular tachycardia, unspecified: Secondary | ICD-10-CM

## 2024-02-27 DIAGNOSIS — Z86711 Personal history of pulmonary embolism: Secondary | ICD-10-CM

## 2024-02-27 DIAGNOSIS — I5032 Chronic diastolic (congestive) heart failure: Secondary | ICD-10-CM

## 2024-02-27 MED ORDER — METOPROLOL SUCCINATE ER 25 MG PO TB24
25.0000 mg | ORAL_TABLET | Freq: Every day | ORAL | 3 refills | Status: AC
Start: 2024-02-27 — End: ?

## 2024-02-27 NOTE — Patient Instructions (Signed)
 Medication Instructions:  Your physician has recommended you make the following change in your medication:   -Stop Lopressor   -Start Toprol  XL 25 mg once daily   *If you need a refill on your cardiac medications before your next appointment, please call your pharmacy*  Lab Work: None If you have labs (blood work) drawn today and your tests are completely normal, you will receive your results only by: MyChart Message (if you have MyChart) OR A paper copy in the mail If you have any lab test that is abnormal or we need to change your treatment, we will call you to review the results.  Testing/Procedures: None  Follow-Up: At [facility], you and your health needs are our priority.  As part of our continuing mission to provide you with exceptional heart care, our providers are all part of one team.  This team includes your primary Cardiologist (physician) and Advanced Practice Providers or APPs (Physician Assistants and Nurse Practitioners) who all work together to provide you with the care you need, when you need it.  Your next appointment:   6 month(s)  Provider:   You may see Vishnu P Mallipeddi, MD or one of the following Advanced Practice Providers on your designated Care Team:   [place], PA-C  [place], [place] Olivia [place], [place]     We recommend signing up for the patient portal called MyChart.  Sign up information is provided on this After Visit Summary.  MyChart is used to connect with patients for Virtual Visits (Telemedicine).  Patients are able to view lab/test results, encounter notes, upcoming appointments, etc.  Non-urgent messages can be sent to your provider as well.   To learn more about what you can do with MyChart, go to [email or web address].   Other Instructions

## 2024-02-28 ENCOUNTER — Telehealth: Payer: Self-pay | Admitting: Internal Medicine

## 2024-02-28 NOTE — Telephone Encounter (Signed)
 PCP office does not have copy of ABI report. It was done by someone/company at home. In home service.

## 2024-02-29 ENCOUNTER — Other Ambulatory Visit (INDEPENDENT_AMBULATORY_CARE_PROVIDER_SITE_OTHER): Payer: Self-pay | Admitting: Gastroenterology

## 2024-03-13 DIAGNOSIS — Z7901 Long term (current) use of anticoagulants: Secondary | ICD-10-CM | POA: Diagnosis not present

## 2024-04-03 DIAGNOSIS — Z7901 Long term (current) use of anticoagulants: Secondary | ICD-10-CM | POA: Diagnosis not present

## 2024-04-06 ENCOUNTER — Other Ambulatory Visit: Payer: Self-pay | Admitting: *Deleted

## 2024-04-06 DIAGNOSIS — R200 Anesthesia of skin: Secondary | ICD-10-CM

## 2024-04-24 DIAGNOSIS — Z7901 Long term (current) use of anticoagulants: Secondary | ICD-10-CM | POA: Diagnosis not present

## 2024-04-27 DIAGNOSIS — E7849 Other hyperlipidemia: Secondary | ICD-10-CM | POA: Diagnosis not present

## 2024-04-27 DIAGNOSIS — I5032 Chronic diastolic (congestive) heart failure: Secondary | ICD-10-CM | POA: Diagnosis not present

## 2024-04-27 DIAGNOSIS — E039 Hypothyroidism, unspecified: Secondary | ICD-10-CM | POA: Diagnosis not present

## 2024-04-27 DIAGNOSIS — J302 Other seasonal allergic rhinitis: Secondary | ICD-10-CM | POA: Diagnosis not present

## 2024-04-27 DIAGNOSIS — M10051 Idiopathic gout, right hip: Secondary | ICD-10-CM | POA: Diagnosis not present

## 2024-04-27 DIAGNOSIS — G4739 Other sleep apnea: Secondary | ICD-10-CM | POA: Diagnosis not present

## 2024-04-27 DIAGNOSIS — I10 Essential (primary) hypertension: Secondary | ICD-10-CM | POA: Diagnosis not present

## 2024-04-27 DIAGNOSIS — M1710 Unilateral primary osteoarthritis, unspecified knee: Secondary | ICD-10-CM | POA: Diagnosis not present

## 2024-04-27 DIAGNOSIS — N1831 Chronic kidney disease, stage 3a: Secondary | ICD-10-CM | POA: Diagnosis not present

## 2024-05-04 ENCOUNTER — Encounter: Payer: Self-pay | Admitting: Vascular Surgery

## 2024-05-05 ENCOUNTER — Encounter: Payer: Self-pay | Admitting: Vascular Surgery

## 2024-05-05 ENCOUNTER — Ambulatory Visit (INDEPENDENT_AMBULATORY_CARE_PROVIDER_SITE_OTHER)

## 2024-05-05 ENCOUNTER — Ambulatory Visit (INDEPENDENT_AMBULATORY_CARE_PROVIDER_SITE_OTHER): Admitting: Vascular Surgery

## 2024-05-05 VITALS — BP 142/81 | HR 75 | Temp 98.0°F | Resp 20 | Ht 59.0 in | Wt 156.0 lb

## 2024-05-05 DIAGNOSIS — R202 Paresthesia of skin: Secondary | ICD-10-CM

## 2024-05-05 DIAGNOSIS — R200 Anesthesia of skin: Secondary | ICD-10-CM | POA: Diagnosis not present

## 2024-05-05 DIAGNOSIS — I739 Peripheral vascular disease, unspecified: Secondary | ICD-10-CM | POA: Insufficient documentation

## 2024-05-05 LAB — VAS US ABI WITH/WO TBI

## 2024-05-05 NOTE — Progress Notes (Signed)
 Patient name: Dorothy Ford MRN: [id number] DOB: 14-Apr-1951 Sex: female  REASON FOR CONSULT: Decreased sensation of feet  HPI: Dorothy Ford is a 73 y.o. female, with history of hypertension, hyperlipidemia, CKD, CHF who presents for evaluation of PAD and decreased sensation in her feet.  Patient states she has had burning and tingling to both feet.  She feels this started around February of this year when she was hospitalized for about 3 weeks with COVID.  Was then sent to the [facility] rehab facility.  Now back at home.  Denies any prior revascularization procedures.  States she really has a hard time walking and walks with a walker.  Feels the numbness tingling is in both feet equally.  Past Medical History:  Diagnosis Date   Acid reflux    Asthma    Cardiac dysrhythmia, unspecified    Carotid bruit    Bilateral   Cataract    Chest pain, unspecified    Chronic airway obstruction, not elsewhere classified    Chronic kidney disease    mild per patient   Chronic sinusitis    Congestive heart failure, unspecified    DDD (degenerative disc disease), cervical 07/24/2016   DDD (degenerative disc disease), lumbar 07/24/2016   Depressive disorder, not elsewhere classified    Diaphragmatic hernia without mention of obstruction or gangrene    Edema    left leg   Fibromyalgia    Hemorrhoid    High cholesterol    History of rheumatoid arthritis    Hypothyroidism    Neuropathy    Obesity, unspecified    Obstructive lung disease (generalized) (HCC)    Osteoarthritis of both hands 07/24/2016   Osteopenia    Osteoporosis 07/24/2016   Palpitations    Pseudogout involving multiple joints    Sleep apnea    Sleep apnea    per patient   SVT (supraventricular tachycardia) (HCC)    Syncope    admitted 09/2011   Thyroid  disease    Unspecified essential hypertension     Past Surgical History:  Procedure Laterality Date   CARPAL TUNNEL RELEASE Bilateral    CATARACT EXTRACTION,  BILATERAL     11/02/2021, 11/23/2021   CATHETER ABLATION  02/01/1997   @ [facility]   COLONOSCOPY WITH PROPOFOL  N/A 08/18/2021   multiple adenomas. non-bleeding internal hemorrhoids.   INCONTINENCE SURGERY     POLYPECTOMY  08/18/2021   Procedure: POLYPECTOMY;  Surgeon: Eartha Flavors, Toribio, MD;  Location: [facility];  Service: Gastroenterology;;   removal toenail Bilateral    great toes   VEIN SURGERY BLADDER      Family History  Problem Relation Age of Onset   Heart disease Mother    Hypertension Mother    Stroke Other    Heart disease Father    Hypertension Father    Kidney failure Sister    Asthma Sister     SOCIAL HISTORY: Social History   Socioeconomic History   Marital status: Single    Spouse name: Not on file   Number of children: Not on file   Years of education: Not on file   Highest education level: Not on file  Occupational History   Not on file  Tobacco Use   Smoking status: Never    Passive exposure: Current   Smokeless tobacco: Never  Vaping Use   Vaping status: Never Used  Substance and Sexual Activity   Alcohol use: No    Alcohol/week: 0.0 standard drinks of alcohol  Drug use: Never   Sexual activity: Not Currently  Other Topics Concern   Not on file  Social History Narrative   Not on file   Social Drivers of Health   Financial Resource Strain: Low Risk  (10/23/2023)   Received from [facility]   Overall Financial Resource Strain (CARDIA)    Difficulty of Paying Living Expenses: Not hard at all  Food Insecurity: No Food Insecurity (10/23/2023)   Received from [facility]   Hunger Vital Sign    Within the past 12 months, you worried that your food would run out before you got the money to buy more.: Never true    Within the past 12 months, the food you bought just didn't last and you didn't have money to get more.: Never true  Transportation Needs: No Transportation Needs (10/23/2023)   Received from [facility]   PRAPARE -  Transportation    Lack of Transportation (Medical): No    Lack of Transportation (Non-Medical): No  Physical Activity: Insufficiently Active (10/23/2023)   Received from [facility]   Exercise Vital Sign    On average, how many days per week do you engage in moderate to strenuous exercise (like a brisk walk)?: 3 days    On average, how many minutes do you engage in exercise at this level?: 20 min  Stress: No Stress Concern Present (10/23/2023)   Received from [facility] - Occupational Stress Questionnaire    Feeling of Stress : Not at all  Social Connections: Socially Isolated (10/23/2023)   Received from [facility]   Social Connection and Isolation Panel    In a typical week, how many times do you talk on the phone with family, friends, or neighbors?: Never    How often do you get together with friends or relatives?: Twice a week    How often do you attend church or religious services?: Never    Do you belong to any clubs or organizations such as church groups, unions, fraternal or athletic groups, or school groups?: No    How often do you attend meetings of the clubs or organizations you belong to?: Never    Are you married, widowed, divorced, separated, never married, or living with a partner?: Never married  Intimate Partner Violence: Not At Risk (10/23/2023)   Received from [facility]   Humiliation, Afraid, Rape, and Kick questionnaire    Within the last year, have you been afraid of your partner or ex-partner?: No    Within the last year, have you been humiliated or emotionally abused in other ways by your partner or ex-partner?: No    Within the last year, have you been kicked, hit, slapped, or otherwise physically hurt by your partner or ex-partner?: No    Within the last year, have you been raped or forced to have any kind of sexual activity by your partner or ex-partner?: No    Allergies  Allergen Reactions    Nitroglycerin     headaches   Other     Heart monitor pads causes blisters    Adhesive [Tape] Rash   Codeine Nausea And Vomiting   Latex Rash    Current Outpatient Medications  Medication Sig Dispense Refill   albuterol  (ACCUNEB ) 1.25 MG/3ML nebulizer solution Take 1 ampule by nebulization in the morning, at noon, in the evening, and at bedtime.     albuterol  (PROVENTIL  HFA;VENTOLIN  HFA) 108 (  90 Base) MCG/ACT inhaler Inhale 2 puffs into the lungs every 6 (six) hours as needed for wheezing or shortness of breath.     ascorbic acid (VITAMIN C) 500 MG tablet Take 500 mg by mouth daily.     atorvastatin (LIPITOR) 40 MG tablet Take 40 mg by mouth daily.     clonazePAM  (KLONOPIN ) 0.5 MG tablet Take 0.5 mg by mouth at bedtime.      COLCRYS  0.6 MG tablet TAKE 1 TABLET BY MOUTH ONCE DAILY. (Patient taking differently: Take 0.6 mg by mouth daily.) 30 tablet 0   cycloSPORINE (RESTASIS) 0.05 % ophthalmic emulsion Place 1 drop into both eyes 2 (two) times daily.     dicyclomine  (BENTYL ) 10 MG capsule TAKE 1 CAPSULE BY MOUTH 3 TIMES DAILY AS NEEDED FOR SPASMS. 90 capsule 3   Elastic Bandages & Supports (MEDICAL COMPRESSION STOCKINGS) MISC 1 each by Does not apply route as directed. 1 pair low pressure knee high compression stockings Dx: leg edema 1 each 0   empagliflozin  (JARDIANCE ) 10 MG TABS tablet Take 1 tablet (10 mg total) by mouth daily before breakfast. 30 tablet 11   famotidine (PEPCID) 20 MG tablet Take 20 mg by mouth daily as needed for heartburn.     fexofenadine (ALLEGRA) 180 MG tablet Take 180 mg by mouth daily.     fluticasone (FLONASE) 50 MCG/ACT nasal spray Place 2 sprays into both nostrils daily.     furosemide  (LASIX ) 40 MG tablet TAKE 1 TABLET BY MOUTH ONCE DAILY. 30 tablet 6   guaiFENesin (MUCINEX) 600 MG 12 hr tablet Take 600 mg by mouth 2 (two) times daily as needed for to loosen phlegm.     hydrocortisone  (ANUSOL -HC) 2.5 % rectal cream Place 1 Application rectally 2 (two) times  daily. Twice daily x 10 days then up to twice daily as needed for itching/irritation from hemorrhoids 30 g 1   levothyroxine (SYNTHROID, LEVOTHROID) 50 MCG tablet Take 50 mcg by mouth daily before breakfast.      loperamide (IMODIUM A-D) 2 MG tablet Take 2 mg by mouth 4 (four) times daily as needed for diarrhea or loose stools.     metoprolol  succinate (TOPROL  XL) 25 MG 24 hr tablet Take 1 tablet (25 mg total) by mouth daily. 90 tablet 3   montelukast (SINGULAIR) 10 MG tablet Take 10 mg by mouth daily.     naloxone (NARCAN) 4 MG/0.1ML LIQD nasal spray kit Place 0.4 mg into the nose daily as needed (OR).     nystatin (MYCOSTATIN) powder Apply 1 Application topically daily as needed (Rash).     olmesartan (BENICAR) 40 MG tablet Take 40 mg by mouth daily.     pantoprazole  (PROTONIX ) 40 MG tablet take 1 tablet daily before breakfast. 90 tablet 0   potassium chloride  (K-DUR) 10 MEQ tablet Take 3 tablets (30 mEq total) by mouth daily. 90 tablet 6   senna (SENOKOT) 8.6 MG tablet Take 2 tablets by mouth 2 (two) times daily as needed for constipation.     Simethicone 125 MG CAPS Take 2 capsules by mouth daily with supper.     SYMBICORT 160-4.5 MCG/ACT inhaler Inhale 2 puffs into the lungs 2 (two) times daily.     warfarin (COUMADIN) 2 MG tablet Take 2 mg by mouth daily at 4 PM.  Managed by PCP     Zoledronic  Acid (RECLAST  IV) Inject into the vein See admin instructions. Dose: 08/08/2022 takes once a year     No current facility-administered  medications for this visit.    REVIEW OF SYSTEMS:  [X]  denotes positive finding, [ ]  denotes negative finding Cardiac  Comments:  Chest pain or chest pressure:    Shortness of breath upon exertion:    Short of breath when lying flat:    Irregular heart rhythm:        Vascular    Pain in calf, thigh, or hip brought on by ambulation:    Pain in feet at night that wakes you up from your sleep:     Blood clot in your veins:    Leg swelling:         Pulmonary     Oxygen  at home:    Productive cough:     Wheezing:         Neurologic    Sudden weakness in arms or legs:     Sudden numbness in arms or legs:     Sudden onset of difficulty speaking or slurred speech:    Temporary loss of vision in one eye:     Problems with dizziness:         Gastrointestinal    Blood in stool:     Vomited blood:         Genitourinary    Burning when urinating:     Blood in urine:        Psychiatric    Major depression:         Hematologic    Bleeding problems:    Problems with blood clotting too easily:        Skin    Rashes or ulcers:        Constitutional    Fever or chills:      PHYSICAL EXAM: There were no vitals filed for this visit.  GENERAL: The patient is a well-nourished female, in no acute distress. The vital signs are documented above. CARDIAC: There is a regular rate and rhythm.  VASCULAR:  Bilateral femoral pulses palpable Bilateral DP pulses palpable No lower extremity tissue loss PULMONARY: No respiratory distress. ABDOMEN: Soft and non-tender. MUSCULOSKELETAL: There are no major deformities or cyanosis. NEUROLOGIC: No focal weakness or paresthesias are detected. SKIN: There are no ulcers or rashes noted. PSYCHIATRIC: The patient has a normal affect.  DATA:   ABIs today are noncompressible but she has normal triphasic waveforms at the ankle with a toe pressure 53 on the right 87 on the left  Assessment/Plan:  73 y.o. female, with history of hypertension, hyperlipidemia, CKD, CHF who presents for evaluation of PAD and decreased sensation in her feet.  Ultimately I discussed that I do not feel arterial insufficiency would explain her numbness and tingling in both feet.  She has palpable DP pulses bilaterally.  She has normal triphasic waveforms at the ankle based on non-invasive imaging today.  She does have evidence of calcified arteries given her ABIs are noncompressible but again with a normal exam and normal waveforms I  think she is getting adequate inflow at baseline.  I will set up a follow-up in 1 year for surveillance in the [facility] clinic given noncompressible ABIs with a slightly decreased toe pressure on the right.  She has no wounds or tissue loss to warrant intervention from our standpoint at this time and the toe pressure can be monitored on future surveillance.  Discussed she call with questions or concerns.   Lonni DOROTHA Gaskins, MD [facility] [employer] Office: [phone]

## 2024-05-12 ENCOUNTER — Other Ambulatory Visit (INDEPENDENT_AMBULATORY_CARE_PROVIDER_SITE_OTHER): Payer: Self-pay | Admitting: Gastroenterology

## 2024-05-15 DIAGNOSIS — Z7901 Long term (current) use of anticoagulants: Secondary | ICD-10-CM | POA: Diagnosis not present

## 2024-06-10 DIAGNOSIS — Z7901 Long term (current) use of anticoagulants: Secondary | ICD-10-CM | POA: Diagnosis not present

## 2024-06-24 DIAGNOSIS — M545 Low back pain, unspecified: Secondary | ICD-10-CM | POA: Diagnosis not present

## 2024-06-24 DIAGNOSIS — G629 Polyneuropathy, unspecified: Secondary | ICD-10-CM | POA: Diagnosis not present

## 2024-06-24 DIAGNOSIS — M792 Neuralgia and neuritis, unspecified: Secondary | ICD-10-CM | POA: Diagnosis not present

## 2024-06-24 DIAGNOSIS — G8929 Other chronic pain: Secondary | ICD-10-CM | POA: Diagnosis not present

## 2024-06-24 DIAGNOSIS — R55 Syncope and collapse: Secondary | ICD-10-CM | POA: Diagnosis not present

## 2024-07-08 ENCOUNTER — Encounter (INDEPENDENT_AMBULATORY_CARE_PROVIDER_SITE_OTHER): Payer: Self-pay | Admitting: Gastroenterology

## 2024-07-17 ENCOUNTER — Encounter (INDEPENDENT_AMBULATORY_CARE_PROVIDER_SITE_OTHER): Payer: Self-pay | Admitting: *Deleted

## 2024-08-03 ENCOUNTER — Telehealth (INDEPENDENT_AMBULATORY_CARE_PROVIDER_SITE_OTHER): Payer: Self-pay

## 2024-08-03 NOTE — Telephone Encounter (Signed)
 Who is your primary care physician: Dr. Orpha  Reasons for the colonoscopy: history of colon polyps  Have you had a colonoscopy before?  yes  Do you have family history of colon cancer? no  Previous colonoscopy with polyps removed? yes  Do you have a history colorectal cancer?   no  Are you diabetic? If yes, Type 1 or Type 2?    no  Do you have a prosthetic or mechanical heart valve? no  Do you have a pacemaker/defibrillator?   no  Have you had endocarditis/atrial fibrillation? yes  Have you had joint replacement within the last 12 months?  no  Do you tend to be constipated or have to use laxatives? no  Do you have any history of drugs or alchohol?  no  Do you use supplemental oxygen ?  no  Have you had a stroke or heart attack within the last 6 months? no  Do you take weight loss medication?  yes  For female patients: have you had a hysterectomy?  no                                     are you post menopausal?       yes                                            do you still have your menstrual cycle? no      Do you take any blood-thinning medications such as: (aspirin, warfarin, Plavix, Aggrenox)  yes  If yes we need the name, milligram, dosage and who is prescribing doctor warfarin Current Outpatient Medications on File Prior to Visit  Medication Sig Dispense Refill   albuterol  (ACCUNEB ) 1.25 MG/3ML nebulizer solution Take 1 ampule by nebulization in the morning, at noon, in the evening, and at bedtime.     albuterol  (PROVENTIL  HFA;VENTOLIN  HFA) 108 (90 Base) MCG/ACT inhaler Inhale 2 puffs into the lungs every 6 (six) hours as needed for wheezing or shortness of breath.     atorvastatin (LIPITOR) 40 MG tablet Take 40 mg by mouth daily.     clindamycin (CLEOCIN) 300 MG capsule Take 300 mg by mouth as directed. 2 capsules by mouth one hour before appointment     clonazePAM  (KLONOPIN ) 0.5 MG tablet Take 0.5 mg by mouth at bedtime.      COLCRYS  0.6 MG tablet TAKE 1 TABLET  BY MOUTH ONCE DAILY. 30 tablet 0   cycloSPORINE (RESTASIS) 0.05 % ophthalmic emulsion Place 1 drop into both eyes 2 (two) times daily.     dicyclomine  (BENTYL ) 10 MG capsule TAKE 1 CAPSULE BY MOUTH 3 TIMES DAILY AS NEEDED FOR SPASMS. 90 capsule 3   DULoxetine (CYMBALTA) 60 MG capsule Take 60 mg by mouth daily.     Elastic Bandages & Supports (MEDICAL COMPRESSION STOCKINGS) MISC 1 each by Does not apply route as directed. 1 pair low pressure knee high compression stockings Dx: leg edema 1 each 0   empagliflozin  (JARDIANCE ) 10 MG TABS tablet Take 1 tablet (10 mg total) by mouth daily before breakfast. 30 tablet 11   famotidine (PEPCID) 20 MG tablet Take 20 mg by mouth daily as needed for heartburn.     fexofenadine (ALLEGRA) 180 MG tablet Take 180 mg by mouth daily.  fluticasone (FLONASE) 50 MCG/ACT nasal spray Place 2 sprays into both nostrils daily.     furosemide  (LASIX ) 40 MG tablet TAKE 1 TABLET BY MOUTH ONCE DAILY. 30 tablet 6   hydrocortisone  (ANUSOL -HC) 2.5 % rectal cream Place 1 Application rectally 2 (two) times daily. Twice daily x 10 days then up to twice daily as needed for itching/irritation from hemorrhoids 30 g 1   levothyroxine (SYNTHROID, LEVOTHROID) 50 MCG tablet Take 50 mcg by mouth daily before breakfast.      lidocaine  (HM LIDOCAINE  PATCH) 4 % Place 1 patch onto the skin every 12 (twelve) hours.     metoprolol  succinate (TOPROL  XL) 25 MG 24 hr tablet Take 1 tablet (25 mg total) by mouth daily. 90 tablet 3   montelukast (SINGULAIR) 10 MG tablet Take 10 mg by mouth daily.     ondansetron  (ZOFRAN ) 4 MG/5ML solution Take 4 mg by mouth 4 (four) times daily as needed for nausea or vomiting.     pantoprazole  (PROTONIX ) 40 MG tablet take 1 tablet daily before breakfast. 90 tablet 0   potassium chloride  (K-DUR) 10 MEQ tablet Take 3 tablets (30 mEq total) by mouth daily. 90 tablet 6   primidone (MYSOLINE) 50 MG tablet Take 50 mg by mouth at bedtime.     Simethicone 125 MG CAPS Take 2  capsules by mouth daily with supper.     SYMBICORT 160-4.5 MCG/ACT inhaler Inhale 2 puffs into the lungs 2 (two) times daily.     warfarin (COUMADIN) 2 MG tablet Take 2 mg by mouth daily at 4 PM.  Managed by PCP     No current facility-administered medications on file prior to visit.    Allergies  Allergen Reactions   Nitroglycerin     headaches   Other     Heart monitor pads causes blisters    Adhesive [Tape] Rash   Codeine Nausea And Vomiting   Latex Rash     Pharmacy: [facility]  Primary Insurance Name: united healthcare medicare  Best number where you can be reached: [phone]

## 2024-08-03 NOTE — Telephone Encounter (Signed)
 Room 3, clearance to hold warfarin Thanks

## 2024-08-04 ENCOUNTER — Telehealth (INDEPENDENT_AMBULATORY_CARE_PROVIDER_SITE_OTHER): Payer: Self-pay

## 2024-08-04 NOTE — Telephone Encounter (Signed)
 Medication clearance faxed on 08/04/2024 to [phone]

## 2024-08-04 NOTE — Telephone Encounter (Signed)
    08/04/24  Jaionna E Remlinger 1951/04/10  What type of surgery is being performed? Colonoscopy   When is surgery scheduled? To be determined  What type of clearance is required (medical or pharmacy to hold medication or both? Medication  Are there any medications that need to be held prior to surgery and how long? Warfarin, hold 5 days prior  Name of physician performing surgery?  Dr. Eartha Rouse [facility] at [facility] Phone: [phone] Fax: [phone]  Anethesia type (none, local, MAC, general)? MAC     ? Yes ? No Patient can hold medication as requested   Signature: ___________________________

## 2024-08-06 MED ORDER — PEG 3350-KCL-NA BICARB-NACL 420 G PO SOLR: 4000.0000 mL | Freq: Once | ORAL | 0 refills | Status: AC

## 2024-08-06 NOTE — Telephone Encounter (Signed)
 Clearance received, getting scanned into media.

## 2024-08-06 NOTE — Addendum Note (Signed)
 Addended by: DALLIE LIONEL RAMAN on: 08/06/2024 09:04 AM   Modules accepted: Orders

## 2024-08-06 NOTE — Telephone Encounter (Signed)
 Spoke with patient, scheduled TCS for 08/17/2024 at 8:45am. Rx sent to pharmacy. Instructions mailed.

## 2024-08-06 NOTE — Telephone Encounter (Signed)
 ATC patient, no answer. LVM for call back.

## 2024-08-06 NOTE — Telephone Encounter (Signed)
 PA on [facility] for TCS: Notification or Prior Authorization is not required for the requested services You are not required to submit a notification/prior authorization based on the information provided. Decision ID #: [id number]

## 2024-08-06 NOTE — Telephone Encounter (Signed)
 Questionnaire from recall, no referral needed

## 2024-08-12 ENCOUNTER — Inpatient Hospital Stay (HOSPITAL_COMMUNITY): Admission: RE | Admit: 2024-08-12 | Discharge: 2024-08-12 | Attending: Gastroenterology

## 2024-08-12 ENCOUNTER — Encounter (HOSPITAL_COMMUNITY): Payer: Self-pay

## 2024-08-12 NOTE — Pre-Procedure Instructions (Signed)
 Attempted PAT at 9:15- called both numbers in chart.

## 2024-08-17 ENCOUNTER — Ambulatory Visit (HOSPITAL_COMMUNITY): Admitting: Anesthesiology

## 2024-08-17 ENCOUNTER — Ambulatory Visit (HOSPITAL_COMMUNITY)
Admission: RE | Admit: 2024-08-17 | Discharge: 2024-08-17 | Disposition: A | Attending: Gastroenterology | Admitting: Gastroenterology

## 2024-08-17 ENCOUNTER — Other Ambulatory Visit: Payer: Self-pay

## 2024-08-17 ENCOUNTER — Encounter (HOSPITAL_COMMUNITY): Payer: Self-pay | Admitting: Gastroenterology

## 2024-08-17 ENCOUNTER — Encounter (HOSPITAL_COMMUNITY): Admission: RE | Disposition: A | Payer: Self-pay | Attending: Gastroenterology

## 2024-08-17 DIAGNOSIS — G4730 Sleep apnea, unspecified: Secondary | ICD-10-CM | POA: Diagnosis not present

## 2024-08-17 DIAGNOSIS — Z1211 Encounter for screening for malignant neoplasm of colon: Secondary | ICD-10-CM | POA: Insufficient documentation

## 2024-08-17 DIAGNOSIS — N189 Chronic kidney disease, unspecified: Secondary | ICD-10-CM | POA: Diagnosis not present

## 2024-08-17 DIAGNOSIS — D120 Benign neoplasm of cecum: Secondary | ICD-10-CM | POA: Diagnosis not present

## 2024-08-17 DIAGNOSIS — I739 Peripheral vascular disease, unspecified: Secondary | ICD-10-CM | POA: Diagnosis not present

## 2024-08-17 DIAGNOSIS — K648 Other hemorrhoids: Secondary | ICD-10-CM | POA: Diagnosis not present

## 2024-08-17 DIAGNOSIS — J4489 Other specified chronic obstructive pulmonary disease: Secondary | ICD-10-CM | POA: Insufficient documentation

## 2024-08-17 DIAGNOSIS — M810 Age-related osteoporosis without current pathological fracture: Secondary | ICD-10-CM | POA: Insufficient documentation

## 2024-08-17 DIAGNOSIS — J449 Chronic obstructive pulmonary disease, unspecified: Secondary | ICD-10-CM | POA: Diagnosis not present

## 2024-08-17 DIAGNOSIS — Z7722 Contact with and (suspected) exposure to environmental tobacco smoke (acute) (chronic): Secondary | ICD-10-CM | POA: Diagnosis not present

## 2024-08-17 DIAGNOSIS — Z860100 Personal history of colon polyps, unspecified: Secondary | ICD-10-CM

## 2024-08-17 DIAGNOSIS — E039 Hypothyroidism, unspecified: Secondary | ICD-10-CM | POA: Insufficient documentation

## 2024-08-17 DIAGNOSIS — K635 Polyp of colon: Secondary | ICD-10-CM | POA: Diagnosis not present

## 2024-08-17 DIAGNOSIS — K219 Gastro-esophageal reflux disease without esophagitis: Secondary | ICD-10-CM | POA: Diagnosis not present

## 2024-08-17 DIAGNOSIS — Z860101 Personal history of adenomatous and serrated colon polyps: Secondary | ICD-10-CM | POA: Diagnosis not present

## 2024-08-17 DIAGNOSIS — I130 Hypertensive heart and chronic kidney disease with heart failure and stage 1 through stage 4 chronic kidney disease, or unspecified chronic kidney disease: Secondary | ICD-10-CM | POA: Diagnosis not present

## 2024-08-17 DIAGNOSIS — I10 Essential (primary) hypertension: Secondary | ICD-10-CM | POA: Diagnosis not present

## 2024-08-17 HISTORY — PX: POLYPECTOMY: SHX149

## 2024-08-17 HISTORY — PX: COLONOSCOPY: SHX5424

## 2024-08-17 LAB — GLUCOSE, CAPILLARY: Glucose-Capillary: 88 mg/dL (ref 70–99)

## 2024-08-17 LAB — HM COLONOSCOPY

## 2024-08-17 SURGERY — COLONOSCOPY
Anesthesia: Monitor Anesthesia Care

## 2024-08-17 MED ORDER — PROPOFOL 10 MG/ML IV BOLUS
INTRAVENOUS | Status: DC | PRN
  Administered 2024-08-17: 09:00:00 80 mg via INTRAVENOUS

## 2024-08-17 MED ORDER — LIDOCAINE 2% (20 MG/ML) 5 ML SYRINGE
INTRAMUSCULAR | Status: DC | PRN
  Administered 2024-08-17: 09:00:00 50 mg via INTRAVENOUS

## 2024-08-17 MED ORDER — LACTATED RINGERS IV SOLN: INTRAVENOUS | Status: DC

## 2024-08-17 MED ORDER — PROPOFOL 500 MG/50ML IV EMUL
INTRAVENOUS | Status: DC | PRN
  Administered 2024-08-17: 09:00:00 125 ug/kg/min via INTRAVENOUS

## 2024-08-17 NOTE — Anesthesia Postprocedure Evaluation (Signed)
 Anesthesia Post Note  Patient: Dorothy Ford  Procedure(s) Performed: COLONOSCOPY  Patient location during evaluation: Short Stay Anesthesia Type: MAC Level of consciousness: awake Pain management: pain level controlled Vital Signs Assessment: post-procedure vital signs reviewed and stable Respiratory status: spontaneous breathing Cardiovascular status: blood pressure returned to baseline and stable Postop Assessment: no apparent nausea or vomiting Anesthetic complications: no   No notable events documented.   Last Vitals:  Vitals:   08/17/24 0806 08/17/24 0932  BP: (!) 170/73 (!) 123/49  Pulse: 91 87  Resp: 19 16  Temp: 36.5 C   SpO2: 100% 95%    Last Pain:  Vitals:   08/17/24 0900  TempSrc:   PainSc: 3                  Emiah Pellicano

## 2024-08-17 NOTE — Transfer of Care (Signed)
 Immediate Anesthesia Transfer of Care Note  Patient: Dorothy Ford  Procedure(s) Performed: COLONOSCOPY  Patient Location: Short Stay  Anesthesia Type:General  Level of Consciousness: awake  Airway & Oxygen  Therapy: Patient Spontanous Breathing  Post-op Assessment: Report given to RN  Post vital signs: Reviewed and stable  Last Vitals:  Vitals Value Taken Time  BP    Temp    Pulse    Resp    SpO2      Last Pain:  Vitals:   08/17/24 0900  TempSrc:   PainSc: 3       Patients Stated Pain Goal: 4 (08/17/24 0806)  Complications: No notable events documented.

## 2024-08-17 NOTE — Anesthesia Preprocedure Evaluation (Signed)
 Anesthesia Evaluation  Patient identified by MRN, date of birth, ID band Patient awake    Reviewed: Allergy & Precautions, H&P , NPO status , Patient's Chart, lab work & pertinent test results, reviewed documented beta blocker date and time   Airway Mallampati: II  TM Distance: >3 FB Neck ROM: full    Dental no notable dental hx.    Pulmonary shortness of breath, asthma , sleep apnea , COPD   Pulmonary exam normal breath sounds clear to auscultation       Cardiovascular Exercise Tolerance: Good hypertension, + Peripheral Vascular Disease   Rhythm:regular Rate:Normal     Neuro/Psych  PSYCHIATRIC DISORDERS Anxiety Depression     Neuromuscular disease    GI/Hepatic Neg liver ROS,GERD  ,,  Endo/Other  Hypothyroidism    Renal/GU Renal disease  negative genitourinary   Musculoskeletal   Abdominal   Peds  Hematology negative hematology ROS (+)   Anesthesia Other Findings   Reproductive/Obstetrics negative OB ROS                              Anesthesia Physical Anesthesia Plan  ASA: 3  Anesthesia Plan: MAC   Post-op Pain Management:    Induction:   PONV Risk Score and Plan: Propofol  infusion  Airway Management Planned:   Additional Equipment:   Intra-op Plan:   Post-operative Plan:   Informed Consent: I have reviewed the patients History and Physical, chart, labs and discussed the procedure including the risks, benefits and alternatives for the proposed anesthesia with the patient or authorized representative who has indicated his/her understanding and acceptance.     Dental Advisory Given  Plan Discussed with: CRNA  Anesthesia Plan Comments:         Anesthesia Quick Evaluation

## 2024-08-17 NOTE — Op Note (Signed)
 [facility] Patient Name: Dorothy Ford Procedure Date: 08/17/2024 8:40 AM MRN: [id number] Date of Birth: 1950/12/22 Attending MD: Toribio Fortune , , [id number] CSN: [id number] Age: 73 Admit Type: Outpatient Procedure:                Colonoscopy Indications:              Surveillance: Personal history of adenomatous                            polyps on last colonoscopy 3 years ago Providers:                Toribio Fortune, Jon A. Gerome RN, RN, Chad                            Wilson, Technician Referring MD:              Medicines:                Monitored Anesthesia Care Complications:            No immediate complications. Estimated Blood Loss:     Estimated blood loss: none. Procedure:                Pre-Anesthesia Assessment:                           - Prior to the procedure, a History and Physical                            was performed, and patient medications, allergies                            and sensitivities were reviewed. The patient's                            tolerance of previous anesthesia was reviewed.                           - The risks and benefits of the procedure and the                            sedation options and risks were discussed with the                            patient. All questions were answered and informed                            consent was obtained.                           - ASA Grade Assessment: III - A patient with severe                            systemic disease.                           After obtaining informed consent, the colonoscope  was passed under direct vision. Throughout the                            procedure, the patient's blood pressure, pulse, and                            oxygen  saturations were monitored continuously. The                            [id number]-[id number] ([id number]) Peds Colon was introduced                            through the anus and advanced to the the cecum,                             identified by appendiceal orifice and ileocecal                            valve. The colonoscopy was performed without                            difficulty. The patient tolerated the procedure                            well. The quality of the bowel preparation was good. Scope In: 9:01:15 AM Scope Out: 9:27:14 AM Scope Withdrawal Time: 0 hours 14 minutes 41 seconds  Total Procedure Duration: 0 hours 25 minutes 59 seconds  Findings:      The perianal and digital rectal examinations were normal.      A 5 mm polyp was found in the ileocecal valve. The polyp was sessile.       The polyp was removed with a cold snare. Resection and retrieval were       complete.      Non-bleeding internal hemorrhoids were found during retroflexion. The       hemorrhoids were small. Impression:               - One 5 mm polyp at the ileocecal valve, removed                            with a cold snare. Resected and retrieved.                           - Non-bleeding internal hemorrhoids. Moderate Sedation:      Per Anesthesia Care Recommendation:           - Discharge patient to home (ambulatory).                           - Resume previous diet.                           - Await pathology results.                           - Repeat colonoscopy in 5 years for surveillance. Procedure Code(s):        ---  Professional ---                           [phone], Colonoscopy, flexible; with removal of                            tumor(s), polyp(s), or other lesion(s) by snare                            technique Diagnosis Code(s):        --- Professional ---                           Z86.010, Personal history of colonic polyps                           D12.0, Benign neoplasm of cecum                           K64.8, Other hemorrhoids CPT copyright 2022 American Medical Association. All rights reserved. The codes documented in this report are preliminary and upon coder review may  be  revised to meet current compliance requirements. Toribio Fortune, MD Toribio Fortune,  08/17/2024 9:37:10 AM This report has been signed electronically. Number of Addenda: 0

## 2024-08-17 NOTE — H&P (Signed)
 Dorothy Ford is an 73 y.o. female.   Chief Complaint: history of colon polyps HPI: 73 year old female with past medical history of CKD, fibromyalgia, heart failure, SVT, sleep apnea, hyperlipidemia, hypothyroidism, depression, colon for history of colon polyps.  Last colonoscopy 2022, had 7 tubular adenomas.  The patient denies having any complaints such as melena, hematochezia, abdominal pain or distention, change in her bowel movement consistency or frequency, no changes in weight recently.  No family history of colorectal cancer.   Past Medical History:  Diagnosis Date   Acid reflux    Asthma    Cardiac dysrhythmia, unspecified    Carotid bruit    Bilateral   Cataract    Chest pain, unspecified    Chronic airway obstruction, not elsewhere classified    Chronic kidney disease    mild per patient   Chronic sinusitis    DDD (degenerative disc disease), cervical 07/24/2016   DDD (degenerative disc disease), lumbar 07/24/2016   Depressive disorder, not elsewhere classified    Diaphragmatic hernia without mention of obstruction or gangrene    Dyspnea    Edema    left leg   Fibromyalgia    Heart failure (HCC)    Hemorrhoid    High cholesterol    History of rheumatoid arthritis    Hypothyroidism    Neuropathy    Obesity, unspecified    Obstructive lung disease (generalized) (HCC)    Osteoarthritis of both hands 07/24/2016   Osteopenia    Osteoporosis 07/24/2016   Palpitations    Pseudogout involving multiple joints    Sleep apnea    Sleep apnea    per patient   SVT (supraventricular tachycardia)    Syncope    admitted 09/2011   Thyroid  disease    Unspecified essential hypertension     Past Surgical History:  Procedure Laterality Date   CARPAL TUNNEL RELEASE Bilateral    CATARACT EXTRACTION, BILATERAL     11/02/2021, 11/23/2021   CATHETER ABLATION  02/01/1997   @ [facility]   COLONOSCOPY WITH PROPOFOL  N/A 08/18/2021   multiple adenomas. non-bleeding internal  hemorrhoids.   INCONTINENCE SURGERY     POLYPECTOMY  08/18/2021   Procedure: POLYPECTOMY;  Surgeon: Eartha Flavors, Toribio, MD;  Location: [facility];  Service: Gastroenterology;;   removal toenail Bilateral    great toes   VEIN SURGERY BLADDER      Family History  Problem Relation Age of Onset   Heart disease Mother    Hypertension Mother    Stroke Other    Heart disease Father    Hypertension Father    Kidney failure Sister    Asthma Sister    Social History:  reports that she has never smoked. She has been exposed to tobacco smoke. She has never used smokeless tobacco. She reports that she does not drink alcohol and does not use drugs.  Allergies: Allergies[1]  Medications Prior to Admission  Medication Sig Dispense Refill   albuterol  (ACCUNEB ) 1.25 MG/3ML nebulizer solution Take 1 ampule by nebulization in the morning, at noon, in the evening, and at bedtime.     albuterol  (PROVENTIL  HFA;VENTOLIN  HFA) 108 (90 Base) MCG/ACT inhaler Inhale 2 puffs into the lungs every 6 (six) hours as needed for wheezing or shortness of breath.     atorvastatin (LIPITOR) 40 MG tablet Take 40 mg by mouth daily.     clindamycin (CLEOCIN) 300 MG capsule Take 300 mg by mouth as directed. 2 capsules by mouth one hour before appointment  clonazePAM  (KLONOPIN ) 0.5 MG tablet Take 0.5 mg by mouth at bedtime.      COLCRYS  0.6 MG tablet TAKE 1 TABLET BY MOUTH ONCE DAILY. 30 tablet 0   cycloSPORINE (RESTASIS) 0.05 % ophthalmic emulsion Place 1 drop into both eyes 2 (two) times daily.     dicyclomine  (BENTYL ) 10 MG capsule TAKE 1 CAPSULE BY MOUTH 3 TIMES DAILY AS NEEDED FOR SPASMS. 90 capsule 3   DULoxetine (CYMBALTA) 60 MG capsule Take 60 mg by mouth daily.     Elastic Bandages & Supports (MEDICAL COMPRESSION STOCKINGS) MISC 1 each by Does not apply route as directed. 1 pair low pressure knee high compression stockings Dx: leg edema 1 each 0   empagliflozin  (JARDIANCE ) 10 MG TABS tablet Take 1  tablet (10 mg total) by mouth daily before breakfast. 30 tablet 11   famotidine (PEPCID) 20 MG tablet Take 20 mg by mouth daily as needed for heartburn.     fexofenadine (ALLEGRA) 180 MG tablet Take 180 mg by mouth daily.     fluticasone (FLONASE) 50 MCG/ACT nasal spray Place 2 sprays into both nostrils daily.     furosemide  (LASIX ) 40 MG tablet TAKE 1 TABLET BY MOUTH ONCE DAILY. 30 tablet 6   hydrocortisone  (ANUSOL -HC) 2.5 % rectal cream Place 1 Application rectally 2 (two) times daily. Twice daily x 10 days then up to twice daily as needed for itching/irritation from hemorrhoids 30 g 1   levothyroxine (SYNTHROID, LEVOTHROID) 50 MCG tablet Take 50 mcg by mouth daily before breakfast.      lidocaine  (HM LIDOCAINE  PATCH) 4 % Place 1 patch onto the skin every 12 (twelve) hours.     metoprolol  succinate (TOPROL  XL) 25 MG 24 hr tablet Take 1 tablet (25 mg total) by mouth daily. 90 tablet 3   montelukast (SINGULAIR) 10 MG tablet Take 10 mg by mouth daily.     ondansetron  (ZOFRAN ) 4 MG/5ML solution Take 4 mg by mouth 4 (four) times daily as needed for nausea or vomiting.     pantoprazole  (PROTONIX ) 40 MG tablet take 1 tablet daily before breakfast. 90 tablet 0   primidone (MYSOLINE) 50 MG tablet Take 50 mg by mouth at bedtime.     Simethicone 125 MG CAPS Take 2 capsules by mouth daily with supper.     SYMBICORT 160-4.5 MCG/ACT inhaler Inhale 2 puffs into the lungs 2 (two) times daily.     potassium chloride  (K-DUR) 10 MEQ tablet Take 3 tablets (30 mEq total) by mouth daily. 90 tablet 6   warfarin (COUMADIN) 2 MG tablet Take 2 mg by mouth daily at 4 PM.  Managed by PCP      Results for orders placed or performed during the hospital encounter of 08/17/24 (from the past 48 hours)  Glucose, capillary     Status: None   Collection Time: 08/17/24  8:19 AM  Result Value Ref Range   Glucose-Capillary 88 70 - 99 mg/dL    Comment: Glucose reference range applies only to samples taken after fasting for at  least 8 hours.   No results found.  Review of Systems  All other systems reviewed and are negative.   Blood pressure (!) 170/73, pulse 91, temperature 97.7 F (36.5 C), temperature source Oral, resp. rate 19, height 4' 11 (1.499 m), weight 71.7 kg, SpO2 100%. Physical Exam  GENERAL: The patient is AO x3, in no acute distress. HEENT: Head is normocephalic and atraumatic. EOMI are intact. Mouth is well hydrated and without  lesions. NECK: Supple. No masses LUNGS: Clear to auscultation. No presence of rhonchi/wheezing/rales. Adequate chest expansion HEART: RRR, normal s1 and s2. ABDOMEN: Soft, nontender, no guarding, no peritoneal signs, and nondistended. BS +. No masses. EXTREMITIES: Without any cyanosis, clubbing, rash, lesions or edema. NEUROLOGIC: AOx3, no focal motor deficit. SKIN: no jaundice, no rashes  Assessment/Plan 73 year old female with past medical history of CKD, fibromyalgia, heart failure, SVT, sleep apnea, hyperlipidemia, hypothyroidism, depression, colon for history of colon polyps.  Will proceed with colonoscopy.  Toribio Eartha Flavors, MD 08/17/2024, 8:36 AM       [1]  Allergies Allergen Reactions   Nitroglycerin     headaches   Other     Heart monitor pads causes blisters    Adhesive [Tape] Rash   Codeine Nausea And Vomiting   Latex Rash

## 2024-08-17 NOTE — Discharge Instructions (Signed)
You are being discharged to home.  Resume your previous diet.  We are waiting for your pathology results.  Your physician has recommended a repeat colonoscopy in five years for surveillance.  

## 2024-08-18 ENCOUNTER — Encounter (HOSPITAL_COMMUNITY): Payer: Self-pay | Admitting: Gastroenterology

## 2024-08-18 ENCOUNTER — Encounter (INDEPENDENT_AMBULATORY_CARE_PROVIDER_SITE_OTHER): Payer: Self-pay | Admitting: *Deleted

## 2024-08-18 ENCOUNTER — Ambulatory Visit (INDEPENDENT_AMBULATORY_CARE_PROVIDER_SITE_OTHER): Payer: Self-pay | Admitting: Gastroenterology

## 2024-08-18 LAB — SURGICAL PATHOLOGY

## 2024-08-21 NOTE — Progress Notes (Signed)
 5 yr TCS noted in recall Patient result letter mailed procedure note and pathology result faxed to PCP

## 2024-09-03 NOTE — Progress Notes (Deleted)
 "  Cardiology Office Note    Date:  09/03/2024  ID:  Dorothy Ford, DOB 04-Dec-1950, MRN [id number] Cardiologist: Diannah SHAUNNA Maywood, MD { :  History of Present Illness:    Dorothy Ford is a 74 y.o. female with past medical history of chronic HFpEF, COPD, SVT (history of ablation in 1988), HTN, fibromyalgia and history of PE who presents to the office today for 32-month follow-up.  She was last examined by myself in 02/2024 and had recently returned home from [facility] following an admission for COVID and an AKI.  At the time of follow-up, she denied any chest pain or persistent palpitations.  She was continued on her Jardiance  10 mg daily and Lasix  40 mg daily for her HFpEF.  She was only taking Lopressor  25 mg once daily, therefore this was transitioned to Toprol -XL 25 mg daily for sustained release and she was continued on Olmesartan 40 mg daily.  ROS: ***  Studies Reviewed:   EKG: EKG is*** ordered today and demonstrates ***   EKG Interpretation Date/Time:    Ventricular Rate:    PR Interval:    QRS Duration:    QT Interval:    QTC Calculation:   R Axis:      Text Interpretation:         Echocardiogram: 01/2023 IMPRESSIONS     1. Left ventricular ejection fraction, by estimation, is 55 to 60%. The  left ventricle has normal function. The left ventricle has no regional  wall motion abnormalities. Left ventricular diastolic parameters are  consistent with Grade I diastolic  dysfunction (impaired relaxation).   2. Right ventricular systolic function is normal. The right ventricular  size is normal. There is normal pulmonary artery systolic pressure.   3. The mitral valve is normal in structure. No evidence of mitral valve  regurgitation. No evidence of mitral stenosis.   4. The aortic valve is tricuspid. Aortic valve regurgitation is not  visualized. No aortic stenosis is present.   5. The inferior vena cava is normal in size with greater than 50%  respiratory  variability, suggesting right atrial pressure of 3 mmHg.   Comparison(s): No prior Echocardiogram.   NST: 06/2023   Stress ECG is negative for ischemia and arrhythmias.   LV perfusion is normal. There is no evidence of ischemia. There is no evidence of infarction.   Left ventricular function is normal. Nuclear stress EF: 71%.   Findings are consistent with no ischemia and no infarction. The study is low risk.  Risk Assessment/Calculations:   {Does this patient have ATRIAL FIBRILLATION?:[phone]} No BP recorded.  {Refresh Note OR Click here to enter BP  :1}***         Physical Exam:   VS:  There were no vitals taken for this visit.   Wt Readings from Last 3 Encounters:  08/17/24 158 lb (71.7 kg)  05/05/24 156 lb (70.8 kg)  02/27/24 158 lb 6.4 oz (71.8 kg)     GEN: Well nourished, well developed in no acute distress NECK: No JVD; No carotid bruits CARDIAC: ***RRR, no murmurs, rubs, gallops RESPIRATORY:  Clear to auscultation without rales, wheezing or rhonchi  ABDOMEN: Appears non-distended. No obvious abdominal masses. EXTREMITIES: No clubbing or cyanosis. No edema.  Distal pedal pulses are 2+ bilaterally.   Assessment and Plan:      {Are you ordering a CV Procedure (e.g. stress test, cath, DCCV, TEE, etc)?   Press F2        :[id number]}  Signed, Laymon CHRISTELLA Qua, PA-C   "

## 2024-09-09 ENCOUNTER — Ambulatory Visit: Admitting: Student

## 2024-09-24 ENCOUNTER — Other Ambulatory Visit (INDEPENDENT_AMBULATORY_CARE_PROVIDER_SITE_OTHER): Payer: Self-pay | Admitting: Gastroenterology

## 2024-09-24 NOTE — Telephone Encounter (Signed)
 Can we get patient scheduled for office visit? Thank you!!

## 2024-10-07 ENCOUNTER — Ambulatory Visit: Admitting: Student

## 2024-10-13 ENCOUNTER — Ambulatory Visit: Admitting: Internal Medicine

## 2024-10-16 ENCOUNTER — Ambulatory Visit: Admitting: Internal Medicine

## 2024-10-27 ENCOUNTER — Ambulatory Visit (INDEPENDENT_AMBULATORY_CARE_PROVIDER_SITE_OTHER): Admitting: Gastroenterology
